# Patient Record
Sex: Female | Born: 1955
Health system: Southern US, Community
[De-identification: ages and names within clinical notes are randomized; demographics above are authoritative.]

## PROBLEM LIST (undated history)

## (undated) DIAGNOSIS — M1710 Unilateral primary osteoarthritis, unspecified knee: Secondary | ICD-10-CM

## (undated) DIAGNOSIS — C541 Malignant neoplasm of endometrium: Secondary | ICD-10-CM

## (undated) DIAGNOSIS — N979 Female infertility, unspecified: Secondary | ICD-10-CM

## (undated) DIAGNOSIS — N200 Calculus of kidney: Secondary | ICD-10-CM

## (undated) DIAGNOSIS — M810 Age-related osteoporosis without current pathological fracture: Secondary | ICD-10-CM

## (undated) DIAGNOSIS — F329 Major depressive disorder, single episode, unspecified: Secondary | ICD-10-CM

## (undated) DIAGNOSIS — I10 Essential (primary) hypertension: Secondary | ICD-10-CM

## (undated) DIAGNOSIS — H409 Unspecified glaucoma: Secondary | ICD-10-CM

## (undated) DIAGNOSIS — M2240 Chondromalacia patellae, unspecified knee: Secondary | ICD-10-CM

## (undated) DIAGNOSIS — M1990 Unspecified osteoarthritis, unspecified site: Secondary | ICD-10-CM

## (undated) DIAGNOSIS — M722 Plantar fascial fibromatosis: Secondary | ICD-10-CM

## (undated) DIAGNOSIS — I2510 Atherosclerotic heart disease of native coronary artery without angina pectoris: Secondary | ICD-10-CM

## (undated) DIAGNOSIS — B009 Herpesviral infection, unspecified: Secondary | ICD-10-CM

## (undated) DIAGNOSIS — H269 Unspecified cataract: Secondary | ICD-10-CM

## (undated) DIAGNOSIS — N858 Other specified noninflammatory disorders of uterus: Secondary | ICD-10-CM

## (undated) DIAGNOSIS — F419 Anxiety disorder, unspecified: Secondary | ICD-10-CM

## (undated) DIAGNOSIS — H40002 Preglaucoma, unspecified, left eye: Secondary | ICD-10-CM

## (undated) DIAGNOSIS — M19019 Primary osteoarthritis, unspecified shoulder: Secondary | ICD-10-CM

## (undated) DIAGNOSIS — H8110 Benign paroxysmal vertigo, unspecified ear: Secondary | ICD-10-CM

## (undated) DIAGNOSIS — K219 Gastro-esophageal reflux disease without esophagitis: Secondary | ICD-10-CM

## (undated) DIAGNOSIS — K5792 Diverticulitis of intestine, part unspecified, without perforation or abscess without bleeding: Secondary | ICD-10-CM

## (undated) DIAGNOSIS — J309 Allergic rhinitis, unspecified: Secondary | ICD-10-CM

## (undated) DIAGNOSIS — F439 Reaction to severe stress, unspecified: Secondary | ICD-10-CM

## (undated) DIAGNOSIS — Z87442 Personal history of urinary calculi: Secondary | ICD-10-CM

## (undated) DIAGNOSIS — Z86010 Personal history of colonic polyps: Secondary | ICD-10-CM

## (undated) DIAGNOSIS — D751 Secondary polycythemia: Secondary | ICD-10-CM

## (undated) DIAGNOSIS — M7730 Calcaneal spur, unspecified foot: Secondary | ICD-10-CM

## (undated) DIAGNOSIS — G4730 Sleep apnea, unspecified: Secondary | ICD-10-CM

## (undated) DIAGNOSIS — Z8719 Personal history of other diseases of the digestive system: Secondary | ICD-10-CM

## (undated) DIAGNOSIS — M7710 Lateral epicondylitis, unspecified elbow: Secondary | ICD-10-CM

## (undated) DIAGNOSIS — M20019 Mallet finger of unspecified finger(s): Secondary | ICD-10-CM

## (undated) DIAGNOSIS — R0789 Other chest pain: Secondary | ICD-10-CM

## (undated) DIAGNOSIS — N882 Stricture and stenosis of cervix uteri: Secondary | ICD-10-CM

## (undated) DIAGNOSIS — Z973 Presence of spectacles and contact lenses: Secondary | ICD-10-CM

## (undated) DIAGNOSIS — T7840XA Allergy, unspecified, initial encounter: Secondary | ICD-10-CM

## (undated) DIAGNOSIS — R350 Frequency of micturition: Secondary | ICD-10-CM

## (undated) DIAGNOSIS — K5730 Diverticulosis of large intestine without perforation or abscess without bleeding: Secondary | ICD-10-CM

## (undated) HISTORY — PX: TONSILLECTOMY: SUR1361

## (undated) HISTORY — DX: Anxiety disorder, unspecified: F41.9

## (undated) HISTORY — DX: Plantar fascial fibromatosis: M72.2

## (undated) HISTORY — PX: FINGER SURGERY: SHX640

## (undated) HISTORY — DX: Allergy, unspecified, initial encounter: T78.40XA

## (undated) HISTORY — DX: Secondary polycythemia: D75.1

## (undated) HISTORY — DX: Herpesviral infection, unspecified: B00.9

## (undated) HISTORY — DX: Lateral epicondylitis, unspecified elbow: M77.10

## (undated) HISTORY — DX: Unspecified glaucoma: H40.9

## (undated) HISTORY — PX: ABDOMINAL HYSTERECTOMY: SHX81

## (undated) HISTORY — DX: Unspecified cataract: H26.9

## (undated) HISTORY — DX: Other chest pain: R07.89

## (undated) HISTORY — DX: Personal history of colonic polyps: Z86.010

## (undated) HISTORY — DX: Calcaneal spur, unspecified foot: M77.30

## (undated) HISTORY — PX: SEPTOPLASTY: SUR1290

## (undated) HISTORY — DX: Chondromalacia patellae, unspecified knee: M22.40

## (undated) HISTORY — DX: Primary osteoarthritis, unspecified shoulder: M19.019

## (undated) HISTORY — DX: Female infertility, unspecified: N97.9

## (undated) HISTORY — PX: EYE SURGERY: SHX253

## (undated) HISTORY — DX: Essential (primary) hypertension: I10

## (undated) HISTORY — DX: Unilateral primary osteoarthritis, unspecified knee: M17.10

## (undated) HISTORY — PX: CARDIAC CATHETERIZATION: SHX172

## (undated) HISTORY — DX: Mallet finger of unspecified finger(s): M20.019

## (undated) HISTORY — PX: FRACTURE SURGERY: SHX138

## (undated) HISTORY — DX: Allergic rhinitis, unspecified: J30.9

## (undated) HISTORY — DX: Stricture and stenosis of cervix uteri: N88.2

## (undated) HISTORY — DX: Age-related osteoporosis without current pathological fracture: M81.0

## (undated) HISTORY — DX: Calculus of kidney: N20.0

## (undated) HISTORY — DX: Reaction to severe stress, unspecified: F43.9

## (undated) HISTORY — PX: COLONOSCOPY: SHX174

## (undated) HISTORY — DX: Major depressive disorder, single episode, unspecified: F32.9

## (undated) HISTORY — PX: TONSILLECTOMY: SHX5217

---

## 1994-09-02 HISTORY — PX: DIAGNOSTIC LAPAROSCOPY: SUR761

## 2002-09-02 HISTORY — PX: ANAL SPHINCTEROPLASTY: SUR1305

## 2002-09-02 HISTORY — PX: HEMORRHOID SURGERY: SHX153

## 2005-04-23 ENCOUNTER — Other Ambulatory Visit: Admission: RE | Admit: 2005-04-23 | Discharge: 2005-04-23 | Payer: Self-pay | Admitting: Obstetrics and Gynecology

## 2006-06-06 ENCOUNTER — Other Ambulatory Visit: Admission: RE | Admit: 2006-06-06 | Discharge: 2006-06-06 | Payer: Self-pay | Admitting: Obstetrics and Gynecology

## 2007-09-03 HISTORY — PX: COLONOSCOPY: SHX174

## 2007-11-11 ENCOUNTER — Other Ambulatory Visit: Admission: RE | Admit: 2007-11-11 | Discharge: 2007-11-11 | Payer: Self-pay | Admitting: Obstetrics and Gynecology

## 2007-11-26 ENCOUNTER — Ambulatory Visit: Payer: Self-pay | Admitting: Internal Medicine

## 2007-12-08 ENCOUNTER — Ambulatory Visit: Payer: Self-pay | Admitting: Internal Medicine

## 2008-11-29 ENCOUNTER — Other Ambulatory Visit: Admission: RE | Admit: 2008-11-29 | Discharge: 2008-11-29 | Payer: Self-pay | Admitting: Obstetrics and Gynecology

## 2009-09-14 ENCOUNTER — Emergency Department: Payer: Self-pay | Admitting: Emergency Medicine

## 2010-07-17 ENCOUNTER — Encounter: Admission: RE | Admit: 2010-07-17 | Discharge: 2010-07-17 | Payer: Self-pay | Admitting: Family Medicine

## 2011-09-03 HISTORY — PX: CATARACT EXTRACTION, BILATERAL: SHX1313

## 2011-09-03 HISTORY — PX: CATARACT EXTRACTION W/ INTRAOCULAR LENS  IMPLANT, BILATERAL: SHX1307

## 2012-08-05 ENCOUNTER — Telehealth: Payer: Self-pay | Admitting: Internal Medicine

## 2012-08-05 NOTE — Telephone Encounter (Signed)
C/D 08/05/12 for appt.08/11/12

## 2012-08-05 NOTE — Telephone Encounter (Signed)
S/W PT IN REF TO NP APPT. ON 08/11/12@9 :30 REFERRING DR WHITE  DX-POLYCYTHEMIA MAILED NP PACKET

## 2012-08-11 ENCOUNTER — Other Ambulatory Visit (HOSPITAL_BASED_OUTPATIENT_CLINIC_OR_DEPARTMENT_OTHER): Payer: Commercial Managed Care - PPO | Admitting: Lab

## 2012-08-11 ENCOUNTER — Ambulatory Visit (HOSPITAL_BASED_OUTPATIENT_CLINIC_OR_DEPARTMENT_OTHER): Payer: Commercial Managed Care - PPO | Admitting: Internal Medicine

## 2012-08-11 ENCOUNTER — Ambulatory Visit (HOSPITAL_BASED_OUTPATIENT_CLINIC_OR_DEPARTMENT_OTHER): Payer: Commercial Managed Care - PPO

## 2012-08-11 ENCOUNTER — Encounter: Payer: Self-pay | Admitting: Internal Medicine

## 2012-08-11 ENCOUNTER — Telehealth: Payer: Self-pay | Admitting: Internal Medicine

## 2012-08-11 VITALS — BP 132/80 | HR 77 | Temp 97.2°F | Resp 18 | Ht 66.5 in | Wt 177.4 lb

## 2012-08-11 DIAGNOSIS — D751 Secondary polycythemia: Secondary | ICD-10-CM

## 2012-08-11 DIAGNOSIS — D45 Polycythemia vera: Secondary | ICD-10-CM

## 2012-08-11 DIAGNOSIS — D72829 Elevated white blood cell count, unspecified: Secondary | ICD-10-CM

## 2012-08-11 LAB — CBC WITH DIFFERENTIAL/PLATELET
BASO%: 0.1 % (ref 0.0–2.0)
EOS%: 0 % (ref 0.0–7.0)
HCT: 46.2 % (ref 34.8–46.6)
LYMPH%: 7.7 % — ABNORMAL LOW (ref 14.0–49.7)
MCH: 32.3 pg (ref 25.1–34.0)
MCHC: 35.1 g/dL (ref 31.5–36.0)
NEUT%: 86.8 % — ABNORMAL HIGH (ref 38.4–76.8)
Platelets: 310 10*3/uL (ref 145–400)
WBC: 18 10*3/uL — ABNORMAL HIGH (ref 3.9–10.3)

## 2012-08-11 LAB — COMPREHENSIVE METABOLIC PANEL (CC13)
AST: 17 U/L (ref 5–34)
Alkaline Phosphatase: 53 U/L (ref 40–150)
BUN: 18 mg/dL (ref 7.0–26.0)
Creatinine: 0.8 mg/dL (ref 0.6–1.1)
Total Bilirubin: 0.79 mg/dL (ref 0.20–1.20)

## 2012-08-11 NOTE — Telephone Encounter (Signed)
gv and printed pt appt schedule for Dec 31st...the patient aware

## 2012-08-11 NOTE — Progress Notes (Signed)
[facility] [phone]   [phone]  CONSULT NOTE  REASON FOR CONSULTATION:  56 years old white female with polycythemia  HPI Danielle Knapp is a 56 y.o. female with past medical history significant for hypertension, depression, and fertility, glaucoma and kidney stone. The patient was seen recently by her primary care physician Dr. Laurann Montana for routine physical examination and blood pressure monitoring. She had CBC performed recently on 07/22/2012 which showed persistent elevation of her hemoglobin to 16.8 and hematocrit 47.8%. On 02/20/2010 her hemoglobin was 16.9 and hematocrit 48.9. The patient has been on hormonal treatment with progesterone and Vivelle for more than 2 years. She does not take any iron supplements except for multivitamins. She does not take any other nutritional supplements over the counter. She had a cortisone shot in her shoulder yesterday. She otherwise feeling fine with no specific complaints. She denied having any significant weakness and fatigue. She denied having any weight loss or night sweats. She has no palpable lymphadenopathy. She has no bleeding issues. She had previous iron study in 2011 that was normal.   Past medical history: Significant for depression, hypertension, and fertility, polycythemia, glaucoma, kidney stone and bilateral cataract.  Family history: significant for maternal grandmother with bone cancer, mother at heart disease and diverticulitis, father with diabetes mellitus and died from a stroke and a sister with ovarian cancer.  Social History; the patient is married and has 2 step children. 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 She has no history of smoking but drinks alcohol occasionally and no history of drug abuse.  Allergies  Allergen Reactions  . Erythromycin Diarrhea  . Latex Rash  . Nsaids Cough    Current Outpatient Prescriptions  Medication Sig Dispense Refill  .  losartan-hydrochlorothiazide (HYZAAR) 50-12.5 MG per tablet Take 12.5 mg by mouth Daily.      Marland Kitchen MIRVASO 0.33 % GEL Apply 0.33 % topically Daily.      Marland Kitchen ofloxacin (OCUFLOX) 0.3 % ophthalmic solution Place 1 drop into the right eye Twice daily.      . progesterone (PROMETRIUM) 200 MG capsule Take 200 mg by mouth Daily.      Marland Kitchen PROLENSA 0.07 % SOLN Place 1 drop into both eyes Daily.      . sertraline (ZOLOFT) 100 MG tablet Take 200 mg by mouth Daily.      . valACYclovir (VALTREX) 1000 MG tablet Take 1 tablet by mouth Ad lib.      Marland Kitchen VIVELLE-DOT 0.0375 MG/24HR Apply 0.375 mcg topically 2 (two) times a week.      . zolpidem (AMBIEN CR) 6.25 MG CR tablet Take 6.25 mg by mouth Daily.        Review of Systems  A comprehensive review of systems was negative.  Physical Exam  WUX:LKGMW, healthy, no distress, well nourished and well developed SKIN: skin color, texture, turgor are normal HEAD: Normocephalic, No masses, lesions, tenderness or abnormalities EYES: normal, PERRLA EARS: External ears normal OROPHARYNX:no exudate and no erythema  NECK: supple, no adenopathy LYMPH:  no palpable lymphadenopathy, no hepatosplenomegaly BREAST:not examined LUNGS: clear to auscultation  HEART: regular rate & rhythm and no murmurs ABDOMEN:abdomen soft and non-tender BACK: Back symmetric, no curvature. EXTREMITIES:no joint deformities, effusion, or inflammation, no edema, no skin discoloration, no clubbing  NEURO: alert & oriented x 3 with fluent speech, no focal motor/sensory deficits  PERFORMANCE STATUS: ECOG 0  LABORATORY DATA: Lab Results  Component Value Date   WBC  18.0* 08/11/2012   HGB 16.2* 08/11/2012   HCT 46.2 08/11/2012   MCV 91.9 08/11/2012   PLT 310 08/11/2012      Chemistry   No results found for this basename: NA, K, CL, CO2, BUN, CREATININE, GLU   No results found for this basename: CALCIUM, ALKPHOS, AST, ALT, BILITOT       RADIOGRAPHIC STUDIES: No results  found.  ASSESSMENT: This is a very pleasant 56 years old white female with persistent paresis unit for the last 2 years most likely reactive in nature secondary to hormonal treatment but I cannot rule out myeloproliferative disorder at this point. Her leukocytosis today is most likely secondary to her steroid injection yesterday and this is expected to improve in the next 1- 2 weeks.  PLAN: I have a lengthy discussion with the patient today about her condition. I ordered several studies to rule out polycythemia vera including repeat CBC, comprehensive metabolic panel, LDH, and erythropoietin level as well as JAK-2 mutation. I would see the patient back for followup visit in 3 weeks for evaluation and discussion of the pending lab results. I the lab results are consistent with polycythemia vera I would consider the patient for treatment with phlebotomy as-needed basis plus minus treatment with hydroxyurea. The patient agreed to the current plan.  All questions were answered. The patient knows to call the clinic with any problems, questions or concerns. We can certainly see the patient much sooner if necessary.  Thank you so much for allowing me to participate in the care of Danielle Knapp. I will continue to follow up the patient with you and assist in her care.  I spent 25 minutes counseling the patient face to face. The total time spent in the appointment was 50 minutes.  Pravin Perezperez K. 08/11/2012, 10:35 AM

## 2012-08-11 NOTE — Patient Instructions (Signed)
You have elevation in your hemoglobin and hematocrit most likely reactive in nature secondary to hormonal treatment but I will order the blood test to rule out polycythemia vera. Followup in 3 weeks

## 2012-08-28 ENCOUNTER — Telehealth: Payer: Self-pay | Admitting: Internal Medicine

## 2012-08-28 NOTE — Telephone Encounter (Signed)
called pt and she is aware that we r/s her appt time on 12/31     anne

## 2012-09-01 ENCOUNTER — Other Ambulatory Visit: Payer: Commercial Managed Care - PPO | Admitting: Lab

## 2012-09-01 ENCOUNTER — Ambulatory Visit (HOSPITAL_BASED_OUTPATIENT_CLINIC_OR_DEPARTMENT_OTHER): Payer: Commercial Managed Care - PPO | Admitting: Internal Medicine

## 2012-09-01 ENCOUNTER — Other Ambulatory Visit (HOSPITAL_BASED_OUTPATIENT_CLINIC_OR_DEPARTMENT_OTHER): Payer: Commercial Managed Care - PPO | Admitting: Lab

## 2012-09-01 ENCOUNTER — Ambulatory Visit: Payer: Commercial Managed Care - PPO | Admitting: Internal Medicine

## 2012-09-01 ENCOUNTER — Encounter: Payer: Self-pay | Admitting: Internal Medicine

## 2012-09-01 VITALS — BP 119/78 | HR 66 | Temp 97.0°F | Resp 20 | Ht 65.5 in | Wt 179.2 lb

## 2012-09-01 DIAGNOSIS — D45 Polycythemia vera: Secondary | ICD-10-CM

## 2012-09-01 DIAGNOSIS — D751 Secondary polycythemia: Secondary | ICD-10-CM

## 2012-09-01 LAB — CBC WITH DIFFERENTIAL/PLATELET
Basophils Absolute: 0.1 10*3/uL (ref 0.0–0.1)
Eosinophils Absolute: 0.2 10*3/uL (ref 0.0–0.5)
HGB: 16.4 g/dL — ABNORMAL HIGH (ref 11.6–15.9)
MCV: 92.8 fL (ref 79.5–101.0)
MONO%: 8 % (ref 0.0–14.0)
NEUT#: 5 10*3/uL (ref 1.5–6.5)
Platelets: 240 10*3/uL (ref 145–400)
RDW: 13 % (ref 11.2–14.5)

## 2012-09-01 NOTE — Patient Instructions (Signed)
Your blood work is stable today. No clear evidence for polycythemia vera. Followup with your primary care physician as previously scheduled.

## 2012-09-01 NOTE — Progress Notes (Signed)
[facility] [phone]   [phone]  OFFICE PROGRESS NOTE  DIAGNOSIS: Polycythemia most likely reactive in nature.  PRIOR THERAPY: None.  CURRENT THERAPY: Observation.  INTERVAL HISTORY: Danielle Knapp 56 y.o. female returns to the clinic today for followup visit accompanied her husband. The patient is feeling fine today with no specific complaints. She denied having any significant weight loss or night sweats. She has no chest pain, shortness breath, cough or hemoptysis. She has no headache or blurry vision. I ordered several studies to evaluate her polycythemia including JAK-2 mutation that came back negative. Serum erythropoietin was normal at 6.4. The patient had repeat CBC performed earlier today and she is here for evaluation and discussion of her lab results. She still on hormonal therapy with progesterone and Vivelle.   ALLERGIES:  is allergic to erythromycin; latex; and nsaids.  MEDICATIONS:  Current Outpatient Prescriptions  Medication Sig Dispense Refill  . aspirin 81 MG tablet Take 81 mg by mouth daily.      . calcium carbonate (OS-CAL) 600 MG TABS Take 600 mg by mouth 2 (two) times daily with a meal.      . fish oil-omega-3 fatty acids 1000 MG capsule Take 2,400 mg by mouth daily.      Marland Kitchen losartan-hydrochlorothiazide (HYZAAR) 50-12.5 MG per tablet Take 12.5 mg by mouth Daily.      . meclizine (ANTIVERT) 12.5 MG tablet Take 12.5 mg by mouth 3 (three) times daily as needed.      Marland Kitchen MIRVASO 0.33 % GEL Apply 0.33 % topically Daily.      . Multiple Vitamin (MULTIVITAMIN) tablet Take 1 tablet by mouth daily.      . progesterone (PROMETRIUM) 200 MG capsule Take 200 mg by mouth Daily.      . sertraline (ZOLOFT) 100 MG tablet Take 200 mg by mouth Daily.      . valACYclovir (VALTREX) 1000 MG tablet Take 1 tablet by mouth as needed.       Marland Kitchen VIVELLE-DOT 0.0375 MG/24HR Apply 0.375 mcg topically 2 (two) times a week.      . zolpidem (AMBIEN CR)  6.25 MG CR tablet Take 6.25 mg by mouth Daily.        REVIEW OF SYSTEMS:  A comprehensive review of systems was negative.   PHYSICAL EXAMINATION: General appearance: alert, cooperative and no distress Head: Normocephalic, without obvious abnormality, atraumatic Neck: no adenopathy Lymph nodes: Cervical, supraclavicular, and axillary nodes normal. Resp: clear to auscultation bilaterally Cardio: regular rate and rhythm, S1, S2 normal, no murmur, click, rub or gallop GI: soft, non-tender; bowel sounds normal; no masses,  no organomegaly Extremities: extremities normal, atraumatic, no cyanosis or edema  ECOG PERFORMANCE STATUS: 0 - Asymptomatic  Blood pressure 119/78, pulse 66, temperature 97 F (36.1 C), temperature source Oral, resp. rate 20, height 5' 5.5" (1.664 m), weight 179 lb 3.2 oz (81.285 kg).  LABORATORY DATA: Lab Results  Component Value Date   WBC 8.6 09/01/2012   HGB 16.4* 09/01/2012   HCT 45.7 09/01/2012   MCV 92.8 09/01/2012   PLT 240 09/01/2012      Chemistry      Component Value Date/Time   NA 141 08/11/2012 0935   K 3.6 08/11/2012 0935   CL 105 08/11/2012 0935   CO2 26 08/11/2012 0935   BUN 18.0 08/11/2012 0935   CREATININE 0.8 08/11/2012 0935      Component Value Date/Time   CALCIUM 9.9 08/11/2012 0935  ALKPHOS 53 08/11/2012 0935   AST 17 08/11/2012 0935   ALT 24 08/11/2012 0935   BILITOT 0.79 08/11/2012 0935       RADIOGRAPHIC STUDIES: No results found.  ASSESSMENT: This is a very pleasant 56 years old white female with a persistent polycythemia most likely reactive in nature secondary to her hormonal treatment. Her leukocytosis resolved.  PLAN: I discussed the lab result with the patient today. I recommended for her to continue on observation with routine followup visit with her primary care physician. I also advice the patient to consider blood donation every few months at the [facility] as needed. The patient and her husband agreed to the  current plan. She was advised to call me immediately if she has any concerning symptoms or other hematologic abnormalities.  All questions were answered. The patient knows to call the clinic with any problems, questions or concerns. We can certainly see the patient much sooner if necessary.

## 2013-01-26 ENCOUNTER — Encounter: Payer: Self-pay | Admitting: Obstetrics and Gynecology

## 2013-01-26 DIAGNOSIS — I10 Essential (primary) hypertension: Secondary | ICD-10-CM | POA: Insufficient documentation

## 2013-01-26 DIAGNOSIS — F329 Major depressive disorder, single episode, unspecified: Secondary | ICD-10-CM | POA: Insufficient documentation

## 2013-01-26 DIAGNOSIS — F32A Depression, unspecified: Secondary | ICD-10-CM | POA: Insufficient documentation

## 2013-01-27 ENCOUNTER — Encounter: Payer: Self-pay | Admitting: Obstetrics and Gynecology

## 2013-01-27 ENCOUNTER — Ambulatory Visit (INDEPENDENT_AMBULATORY_CARE_PROVIDER_SITE_OTHER): Payer: Commercial Managed Care - PPO | Admitting: Obstetrics and Gynecology

## 2013-01-27 VITALS — BP 110/66 | HR 60 | Ht 66.5 in | Wt 182.0 lb

## 2013-01-27 DIAGNOSIS — Z01419 Encounter for gynecological examination (general) (routine) without abnormal findings: Secondary | ICD-10-CM

## 2013-01-27 DIAGNOSIS — D45 Polycythemia vera: Secondary | ICD-10-CM

## 2013-01-27 DIAGNOSIS — D751 Secondary polycythemia: Secondary | ICD-10-CM

## 2013-01-27 MED ORDER — ESTRADIOL 0.0375 MG/24HR TD PTTW: 1.0000 | MEDICATED_PATCH | TRANSDERMAL | Status: DC

## 2013-01-27 MED ORDER — PROGESTERONE MICRONIZED 200 MG PO CAPS: 200.0000 mg | ORAL_CAPSULE | Freq: Every day | ORAL | Status: DC

## 2013-01-27 NOTE — Patient Instructions (Addendum)

## 2013-01-27 NOTE — Progress Notes (Signed)
Patient ID: Danielle Knapp, female   DOB: 1956/07/12, 57 y.o.   MRN: [id number] 57 y.o.   Married    Caucasian   female   [id number]   here for annual exam.  Had PMB on HRT with poss endometrioma on post lip of cx.  HRT shanged to Vivelle 0.0375 and P4 200 mg qd to try and suppress the endo.  Pt is hightly uncomfortable w/o E2 due to v-m sx's.   No LMP recorded. Patient is postmenopausal.          Sexually active: yes  The current method of family planning is post menopausal status.    Exercising: walking 3 x week Last mammogram:  2013 Last pap smear: 12/20/09 History of abnormal pap: yes , years ago Smoking: never Alcohol: 2 glasses wine/week Last colonoscopy: 12/08/07 Last Bone Density:  2010 Last tetanus shot: UTD Last cholesterol check: within 1 year   Hgb: PCP            Urine: PCP   Family History  Problem Relation Age of Onset  . Osteoporosis Mother   . Heart attack Mother   . Diabetes Father   . Hypertension Father   . Ovarian cancer Sister     Patient Active Problem List   Diagnosis Date Noted  . Depression   . Hypertension   . Polycythemia 08/11/2012    Past Medical History  Diagnosis Date  . Depression   . Hypertension   . Kidney stones 1/11    spontaneously passed dx'd based on blood test & history of cancer    Past Surgical History  Procedure Laterality Date  . Diagnostic laparoscopy  1996  . Septoplasty  1970's    x2  . Anal sphincteroplasty  2004    hemorrhoids  . Tonsillectomy    . Cataract extraction, bilateral  2013    Allergies: Erythromycin; Latex; and Nsaids  Current Outpatient Prescriptions  Medication Sig Dispense Refill  . aspirin 81 MG tablet Take 81 mg by mouth daily.      Marland Kitchen buPROPion (WELLBUTRIN XL) 150 MG 24 hr tablet Take 150 mg by mouth daily.      . Calcium Lactate 750 MG TABS Take 1 tablet by mouth daily.      . fish oil-omega-3 fatty acids 1000 MG capsule Take 2,400 mg by mouth daily.      Marland Kitchen losartan-hydrochlorothiazide (HYZAAR)  50-12.5 MG per tablet Take 12.5 mg by mouth Daily.      . meclizine (ANTIVERT) 12.5 MG tablet Take 12.5 mg by mouth 3 (three) times daily as needed.      . Multiple Vitamin (MULTIVITAMIN) tablet Take 1 tablet by mouth daily.      . naproxen (NAPROSYN) 500 MG tablet Take 500 mg by mouth 2 (two) times daily with a meal.      . progesterone (PROMETRIUM) 200 MG capsule Take 200 mg by mouth Daily.      . sertraline (ZOLOFT) 100 MG tablet Take 100 mg by mouth Daily.       . valACYclovir (VALTREX) 1000 MG tablet Take 1 tablet by mouth as needed.       Marland Kitchen VIVELLE-DOT 0.0375 MG/24HR Apply 0.375 mcg topically 2 (two) times a week.      . zolpidem (AMBIEN CR) 6.25 MG CR tablet Take 6.25 mg by mouth Daily.       No current facility-administered medications for this visit.   Uses valtrex for fever blisters in nose.   ROS: Pertinent  items are noted in HPI.  Social Hx:    Exam:    BP 110/66  Pulse 60  Ht 5' 6.5" (1.689 m)  Wt 182 lb (82.555 kg)  BMI 28.94 kg/m2  Ht stable and wt up 3 pounds from last year.   Wt Readings from Last 3 Encounters:  01/27/13 182 lb (82.555 kg)  09/01/12 179 lb 3.2 oz (81.285 kg)  08/11/12 177 lb 6.4 oz (80.468 kg)     Ht Readings from Last 3 Encounters:  01/27/13 5' 6.5" (1.689 m)  09/01/12 5' 5.5" (1.664 m)  08/11/12 5' 6.5" (1.689 m)    General appearance: alert, cooperative and appears stated age Head: Normocephalic, without obvious abnormality, atraumatic Neck: no adenopathy, supple, symmetrical, trachea midline and thyroid not enlarged, symmetric, no tenderness/mass/nodules Lungs: clear to auscultation bilaterally Breasts: Inspection negative, No nipple retraction or dimpling, No nipple discharge or bleeding, No axillary or supraclavicular adenopathy, Normal to palpation without dominant masses Heart: regular rate and rhythm Abdomen: soft, non-tender; bowel sounds normal; no masses,  no organomegaly Extremities: extremities normal, atraumatic, no cyanosis  or edema Skin: Skin color, texture, turgor normal. No rashes or lesions Lymph nodes: Cervical, supraclavicular, and axillary nodes normal. No abnormal inguinal nodes palpated Neurologic: Grossly normal   Pelvic: External genitalia:  no lesions              Urethra:  normal appearing urethra with no masses, tenderness or lesions              Bartholins and Skenes: normal                 Vagina: normal appearing vagina with normal color and discharge, no lesions              Cervix: normal appearance              Pap taken: yes        Bimanual Exam:  Uterus:  uterus is normal size, shape, consistency and nontender                                      Adnexa: normal adnexa in size, nontender and no masses                                      Rectovaginal: Confirms                                      Anus:  normal sphincter tone, no lesions  A: normal menopausal exam, on HRT     Mild endo on laparoscopy in 1996      P: mammogram pap smear counseled on breast self exam, mammography screening, adequate intake of calcium and vitamin D, diet and exercise return annually or prn  RF her vivelle and p4 for 1 year    An After Visit Summary was printed and given to the patient.

## 2013-02-03 ENCOUNTER — Encounter (HOSPITAL_COMMUNITY): Payer: Self-pay | Admitting: Emergency Medicine

## 2013-02-03 ENCOUNTER — Emergency Department (INDEPENDENT_AMBULATORY_CARE_PROVIDER_SITE_OTHER)
Admission: EM | Admit: 2013-02-03 | Discharge: 2013-02-03 | Disposition: A | Discharge status: Home / Self Care | Payer: Worker's Compensation | Source: Home / Self Care | Attending: Emergency Medicine | Admitting: Emergency Medicine

## 2013-02-03 ENCOUNTER — Other Ambulatory Visit: Payer: Self-pay | Admitting: Obstetrics and Gynecology

## 2013-02-03 DIAGNOSIS — S61409A Unspecified open wound of unspecified hand, initial encounter: Secondary | ICD-10-CM

## 2013-02-03 DIAGNOSIS — S61411A Laceration without foreign body of right hand, initial encounter: Secondary | ICD-10-CM

## 2013-02-03 NOTE — ED Notes (Signed)
Pt was at work when she cut her right hand/knuckle between middle and index finger w/a paper cutter onset 1600 Last tetanus: unknown... Bleeding controlled... She is alert and oriented w/no signs of acute distress.

## 2013-02-03 NOTE — ED Provider Notes (Signed)
History     CSN: [id number]  Arrival date & time 02/03/13  1748   First MD Initiated Contact with Patient 02/03/13 1822      Chief Complaint  Patient presents with  . Laceration    (Consider location/radiation/quality/duration/timing/severity/associated sxs/prior treatment) HPI Comments: Patient presents urgent care this evening after having sustained a laceration with a paper cutter on her right hand in the interdigital space between her index finger and middle finger. She immediately proceeded to clean the area irrigated the area with cold water and apply a topical antibiotic. Patient is able to move her fingers without any restriction both index and middle finger. No numbness or tingling sensation distally or proximally to the area of  Patient is a 57 y.o. female presenting with skin laceration. The history is provided by the patient.  Laceration Location:  Hand Hand laceration location:  R hand Depth:  Cutaneous Bleeding: controlled   Laceration mechanism:  Metal edge Pain details:    Quality:  Aching   Severity:  Mild Relieved by:  Pressure Tetanus status:  Up to date   Past Medical History  Diagnosis Date  . Depression   . Hypertension   . Kidney stones 1/11    spontaneously passed dx'd based on blood test & history of cancer    Past Surgical History  Procedure Laterality Date  . Diagnostic laparoscopy  1996  . Septoplasty  1970's    x2  . Anal sphincteroplasty  2004    hemorrhoids  . Tonsillectomy    . Cataract extraction, bilateral  2013    Family History  Problem Relation Age of Onset  . Osteoporosis Mother   . Heart attack Mother   . Diabetes Father   . Hypertension Father   . Ovarian cancer Sister     History  Substance Use Topics  . Smoking status: Never Smoker   . Smokeless tobacco: Never Used  . Alcohol Use: 1.2 oz/week    2 Glasses of wine per week    OB History   Grav Para Term Preterm Abortions TAB SAB Ect Mult Living   0 0 0 0 0 0  0 0 0 0      Review of Systems  Constitutional: Negative for activity change.  Musculoskeletal: Negative for myalgias and joint swelling.  Skin: Positive for wound. Negative for color change and pallor.    Allergies  Erythromycin; Latex; and Nsaids  Home Medications   Current Outpatient Rx  Name  Route  Sig  Dispense  Refill  . aspirin 81 MG tablet   Oral   Take 81 mg by mouth daily.         Marland Kitchen buPROPion (WELLBUTRIN XL) 150 MG 24 hr tablet   Oral   Take 150 mg by mouth daily.         . Calcium Lactate 750 MG TABS   Oral   Take 1 tablet by mouth daily.         . fish oil-omega-3 fatty acids 1000 MG capsule   Oral   Take 2,400 mg by mouth daily.         Marland Kitchen losartan-hydrochlorothiazide (HYZAAR) 50-12.5 MG per tablet   Oral   Take 12.5 mg by mouth Daily.         . meclizine (ANTIVERT) 12.5 MG tablet   Oral   Take 12.5 mg by mouth 3 (three) times daily as needed.         . Multiple Vitamin (MULTIVITAMIN) tablet  Oral   Take 1 tablet by mouth daily.         . naproxen (NAPROSYN) 500 MG tablet   Oral   Take 500 mg by mouth 2 (two) times daily with a meal.         . progesterone (PROMETRIUM) 200 MG capsule      take 1 capsule by mouth once daily   30 capsule   0   . sertraline (ZOLOFT) 100 MG tablet   Oral   Take 100 mg by mouth Daily.          . valACYclovir (VALTREX) 1000 MG tablet   Oral   Take 1 tablet by mouth as needed.          Marland Kitchen VIVELLE-DOT 0.0375 MG/24HR      apply 1 patch two times a week   8 patch   0   . zolpidem (AMBIEN CR) 6.25 MG CR tablet   Oral   Take 6.25 mg by mouth Daily.           BP 141/86  Pulse 55  Temp(Src) 98.3 F (36.8 C) (Oral)  Resp 20  SpO2 97%  Physical Exam  Constitutional: Vital signs are normal. She appears well-developed and well-nourished.  Musculoskeletal: She exhibits tenderness.       Hands: Neurological: She is alert.  Skin: No rash noted. No erythema.    ED Course    Procedures (including critical care time)  Labs Reviewed - No data to display No results found.   No diagnosis found.    MDM  Superficial laceration to dorsal aspect of right hand. Uncomplicated/epidermal level. We will apply Steri-Strips        Jimmie Molly, MD 02/03/13 [phone]

## 2013-02-03 NOTE — Telephone Encounter (Signed)
Patient has aex for 02/18/14

## 2013-07-08 ENCOUNTER — Other Ambulatory Visit: Payer: Self-pay

## 2013-09-02 HISTORY — PX: HEEL SPUR SURGERY: SHX665

## 2013-11-12 ENCOUNTER — Telehealth: Payer: Self-pay | Admitting: Obstetrics and Gynecology

## 2013-11-12 NOTE — Telephone Encounter (Signed)
Does patient want oral estradiol or patch form?

## 2013-11-12 NOTE — Telephone Encounter (Signed)
Patient is calling saying that insurance is requesting her to change her VIVELLE-DOT 0.0375 MG/24HR  apply 1 patch two times a week, Normal, Last Dose: Not Recorded  to estradiol because it is cheaper she wants to check and make sure it is okay to do so.

## 2013-11-12 NOTE — Telephone Encounter (Signed)
Routing to Dr. Quincy Simmonds as patient is requesting Estradiol instead of Vivelle.   Patient has AEX scheduled with Dr. Quincy Simmonds on 6/19. Prior patient of Dr. Joan Flores.

## 2013-11-15 ENCOUNTER — Other Ambulatory Visit: Payer: Self-pay | Admitting: Obstetrics and Gynecology

## 2013-11-15 MED ORDER — ESTRADIOL 0.0375 MG/24HR TD PTWK: 0.0375 mg | MEDICATED_PATCH | TRANSDERMAL | Status: DC

## 2013-11-15 NOTE — Telephone Encounter (Signed)
I put an order through for Estradiol patch 0.0375 mg , changing weekly, #4, RF 3 to get patient through until her annual examination in July 2015.  Please let the patient know.

## 2013-11-15 NOTE — Telephone Encounter (Signed)
Dr. Quincy Simmonds, patient would like to try the generic estradiol patch. I advised patch would be larger and sometimes patients have difficulty with sticking to skin but patient will "give it a try" and call back with any symptoms.     Order pended for your review.

## 2013-11-16 NOTE — Telephone Encounter (Signed)
Patient aware. Will call back with any problems.

## 2013-11-16 NOTE — Telephone Encounter (Signed)
Message left to return call to Tracy at [phone].    

## 2013-11-18 ENCOUNTER — Telehealth: Payer: Self-pay | Admitting: Obstetrics and Gynecology

## 2013-11-18 NOTE — Telephone Encounter (Signed)
Patient is calling about estridol patch. She wants to make sure that is is only once a week because her previous medication was twice a week.

## 2013-11-19 NOTE — Telephone Encounter (Signed)
Spoke with pt to advise that the new patch is to be used once weekly. Pt agreeable and will call with any problems.

## 2014-02-07 ENCOUNTER — Other Ambulatory Visit: Payer: Self-pay | Admitting: *Deleted

## 2014-02-07 MED ORDER — PROGESTERONE MICRONIZED 200 MG PO CAPS: 200.0000 mg | ORAL_CAPSULE | Freq: Every day | ORAL | Status: DC

## 2014-02-07 NOTE — Telephone Encounter (Signed)
Last AEX and refill 01/27/13 #30/12 refills Next appt 02/18/14 MMG 09/2013 [id number]  Rx sent for 1 month until appt.

## 2014-02-11 ENCOUNTER — Other Ambulatory Visit: Payer: Self-pay | Admitting: *Deleted

## 2014-02-11 MED ORDER — ESTRADIOL 0.0375 MG/24HR TD PTWK: 0.0375 mg | MEDICATED_PATCH | TRANSDERMAL | Status: DC

## 2014-02-11 NOTE — Telephone Encounter (Signed)
Faxed refill request received from [facility] for [place] filled by MD on 11/15/13, #4 X 3 Last AEX - 01/27/13 Next AEX - 02/18/14 Last MMG - 09/2013, normal  One month supply sent to last until AEX.

## 2014-02-18 ENCOUNTER — Encounter: Payer: Self-pay | Admitting: Obstetrics and Gynecology

## 2014-02-18 ENCOUNTER — Ambulatory Visit: Payer: Commercial Managed Care - PPO | Admitting: Obstetrics and Gynecology

## 2014-02-18 ENCOUNTER — Ambulatory Visit (INDEPENDENT_AMBULATORY_CARE_PROVIDER_SITE_OTHER): Payer: Commercial Managed Care - PPO | Admitting: Obstetrics and Gynecology

## 2014-02-18 VITALS — BP 128/70 | HR 60 | Ht 66.0 in | Wt 171.6 lb

## 2014-02-18 DIAGNOSIS — Z0000 Encounter for general adult medical examination without abnormal findings: Secondary | ICD-10-CM

## 2014-02-18 DIAGNOSIS — Z01419 Encounter for gynecological examination (general) (routine) without abnormal findings: Secondary | ICD-10-CM

## 2014-02-18 DIAGNOSIS — N952 Postmenopausal atrophic vaginitis: Secondary | ICD-10-CM

## 2014-02-18 LAB — POCT URINALYSIS DIPSTICK
BILIRUBIN UA: NEGATIVE
Blood, UA: NEGATIVE
Glucose, UA: NEGATIVE
KETONES UA: NEGATIVE
LEUKOCYTES UA: NEGATIVE
Nitrite, UA: NEGATIVE
PH UA: 5
PROTEIN UA: NEGATIVE
Urobilinogen, UA: NEGATIVE

## 2014-02-18 MED ORDER — ESTRADIOL 0.1 MG/GM VA CREA: TOPICAL_CREAM | VAGINAL | Status: DC

## 2014-02-18 MED ORDER — ESTRADIOL 0.0375 MG/24HR TD PTWK: 0.0375 mg | MEDICATED_PATCH | TRANSDERMAL | Status: DC

## 2014-02-18 MED ORDER — PROGESTERONE MICRONIZED 100 MG PO CAPS: 100.0000 mg | ORAL_CAPSULE | Freq: Every day | ORAL | Status: DC

## 2014-02-18 NOTE — Progress Notes (Signed)
Patient ID: Danielle Knapp, female   DOB: 12-03-1955, 58 y.o.   MRN: [id number] GYNECOLOGY VISIT  PCP:   Harlan Stains, MD  Referring provider:   HPI: 58 y.o.   Married  Caucasian  female   G0P0000 with Patient's last menstrual period was 09/03/2011.   here for   AEX. Patient is on Climara and Prometrium.  Takes to treat hot flashes and night sweats. Climara is generic now.  Experiencing vaginal dryness.   Some stress eating.  Taking Zoloft.   Helping to take care of mother who lives at [facility], skilled care nursing. Just moved up in November 2014.   Hgb:   PCP Urine:  Neg  GYNECOLOGIC HISTORY: Patient's last menstrual period was 09/03/2011. Sexually active:  yes Partner preference: female Contraception:  postmenopausal  Menopausal hormone therapy:  Climara patch DES exposure:   no Blood transfusions:  no  Sexually transmitted diseases: no   GYN procedures and prior surgeries:  Diagnostic laparoscopy Last mammogram:  09-23-13 wnl:[facility]               Last pap and high risk HPV testing:   01-27-13 wnl:neg HR HPV History of abnormal pap smear:  no   OB History   Grav Para Term Preterm Abortions TAB SAB Ect Mult Living   0 0 0 0 0 0 0 0 0 0        LIFESTYLE: Exercise:   Gym and walking            Tobacco:   no Alcohol:   2-3 drinks per week Drug use:  no  OTHER HEALTH MAINTENANCE: Tetanus/TDap:  Up to date with PCP Gardisil:             n/a Zostavax:           n/a  Bone density:     03/2009 wnl Colonoscopy:     12/2007 hx of polyps:?[facility]  Cholesterol check:  With PCP  Family History  Problem Relation Age of Onset  . Osteoporosis Mother   . Heart attack Mother   . Diabetes Father   . Hypertension Father   . Ovarian cancer Sister     Patient Active Problem List   Diagnosis Date Noted  . Depression   . Hypertension   . Polycythemia 08/11/2012   Past Medical History  Diagnosis Date  . Depression   . Hypertension   . Kidney stones 1/11     spontaneously passed dx'd based on blood test & history of cancer    Past Surgical History  Procedure Laterality Date  . Diagnostic laparoscopy  1996  . Septoplasty  1970's    x2  . Anal sphincteroplasty  2004    hemorrhoids  . Tonsillectomy    . Cataract extraction, bilateral  2013    ALLERGIES: Erythromycin; Latex; and Nsaids  Current Outpatient Prescriptions  Medication Sig Dispense Refill  . aspirin 81 MG tablet Take 81 mg by mouth daily.      Marland Kitchen buPROPion (WELLBUTRIN XL) 150 MG 24 hr tablet Take 150 mg by mouth daily.      . Calcium Lactate 750 MG TABS Take 1 tablet by mouth daily.      Marland Kitchen estradiol (CLIMARA - DOSED IN MG/24 HR) 0.0375 mg/24hr patch Place 1 patch (0.0375 mg total) onto the skin once a week.  4 patch  0  . fish oil-omega-3 fatty acids 1000 MG capsule Take 2,400 mg by mouth daily.      Marland Kitchen  losartan-hydrochlorothiazide (HYZAAR) 50-12.5 MG per tablet Take 12.5 mg by mouth Daily.      . meclizine (ANTIVERT) 12.5 MG tablet Take 12.5 mg by mouth 3 (three) times daily as needed.      . Multiple Vitamin (MULTIVITAMIN) tablet Take 1 tablet by mouth daily.      . naproxen (NAPROSYN) 500 MG tablet Take 500 mg by mouth 2 (two) times daily with a meal.      . progesterone (PROMETRIUM) 200 MG capsule Take 1 capsule (200 mg total) by mouth daily.  30 capsule  0  . sertraline (ZOLOFT) 100 MG tablet Take 100 mg by mouth Daily.       . valACYclovir (VALTREX) 1000 MG tablet Take 1 tablet by mouth as needed.       . zolpidem (AMBIEN) 10 MG tablet Take 10 mg by mouth at bedtime as needed for sleep.       No current facility-administered medications for this visit.     ROS:  Pertinent items are noted in HPI.  SOCIAL HISTORY:  Statistician.   PHYSICAL EXAMINATION:    BP 128/70  Pulse 60  Ht 5\' 6"  (1.676 m)  Wt 171 lb 9.6 oz (77.837 kg)  BMI 27.71 kg/m2  LMP 09/03/2011   Wt Readings from Last 3 Encounters:  02/18/14 171 lb 9.6 oz (77.837 kg)  01/27/13 182 lb (82.555  kg)  09/01/12 179 lb 3.2 oz (81.285 kg)     Ht Readings from Last 3 Encounters:  02/18/14 5\' 6"  (1.676 m)  01/27/13 5' 6.5" (1.689 m)  09/01/12 5' 5.5" (1.664 m)    General appearance: alert, cooperative and appears stated age Head: Normocephalic, without obvious abnormality, atraumatic Neck: no adenopathy, supple, symmetrical, trachea midline and thyroid not enlarged, symmetric, no tenderness/mass/nodules Lungs: clear to auscultation bilaterally Breasts: Inspection negative, No nipple retraction or dimpling, No nipple discharge or bleeding, No axillary or supraclavicular adenopathy, Normal to palpation without dominant masses Heart: regular rate and rhythm Abdomen: soft, non-tender; no masses,  no organomegaly Extremities: extremities normal, atraumatic, no cyanosis or edema Skin: Skin color, texture, turgor normal. No rashes or lesions Lymph nodes: Cervical, supraclavicular, and axillary nodes normal. No abnormal inguinal nodes palpated Neurologic: Grossly normal  Pelvic: External genitalia:  no lesions              Urethra:  normal appearing urethra with no masses, tenderness or lesions              Bartholins and Skenes: normal                 Vagina: normal appearing vagina with normal color and discharge, no lesions              Cervix: normal appearance              Pap and high risk HPV testing done: no.            Bimanual Exam:  Uterus:  uterus is normal size, shape, consistency and nontender                                      Adnexa: normal adnexa in size, nontender and no masses  Rectovaginal: Confirms                                      Anus:  normal sphincter tone, no lesions  ASSESSMENT  Normal gynecologic exam. HRT patient.  Atrophic vaginal symptoms.   PLAN  Mammogram recommended yearly.  Pap smear and high risk HPV testing not indicated.  Discussed risks of HRT - DVT, PE, MI, stroke, breast cancer.  Will refill  Climara 0.0375 mg to skin weekly.  Rx for one year.  See EPIC. Prometrium reduce to 100 mg daily.  Rx for one year.  See EPIC. Vaginal Estrace cream 1/2 gm pv at hs for 2 weeks and then twice weekly.  See EPIC. Counseled on self breast exam, exercise. Return annually or prn   An After Visit Summary was printed and given to the patient.

## 2014-02-18 NOTE — Patient Instructions (Signed)

## 2014-06-17 ENCOUNTER — Other Ambulatory Visit: Payer: Self-pay

## 2014-07-19 ENCOUNTER — Telehealth: Payer: Self-pay | Admitting: Obstetrics and Gynecology

## 2014-07-19 NOTE — Telephone Encounter (Signed)
Left message upcoming appointment 02/2015 has been canceled and needs to be rescheduled.

## 2015-02-20 ENCOUNTER — Ambulatory Visit (INDEPENDENT_AMBULATORY_CARE_PROVIDER_SITE_OTHER): Payer: Commercial Managed Care - PPO | Admitting: Obstetrics and Gynecology

## 2015-02-20 ENCOUNTER — Encounter: Payer: Self-pay | Admitting: Obstetrics and Gynecology

## 2015-02-20 VITALS — BP 120/66 | HR 60 | Resp 18 | Ht 66.0 in | Wt 177.4 lb

## 2015-02-20 DIAGNOSIS — Z01419 Encounter for gynecological examination (general) (routine) without abnormal findings: Secondary | ICD-10-CM

## 2015-02-20 MED ORDER — PROGESTERONE MICRONIZED 100 MG PO CAPS: 100.0000 mg | ORAL_CAPSULE | Freq: Every day | ORAL | Status: DC

## 2015-02-20 MED ORDER — ESTRADIOL 0.0375 MG/24HR TD PTTW: 1.0000 | MEDICATED_PATCH | TRANSDERMAL | Status: DC

## 2015-02-20 MED ORDER — ESTRADIOL 0.1 MG/GM VA CREA: TOPICAL_CREAM | VAGINAL | Status: DC

## 2015-02-20 NOTE — Progress Notes (Signed)
Patient ID: Danielle Knapp, female   DOB: 1956/02/19, 59 y.o.   MRN: [id number] 59 y.o. G0P0000 Married Caucasian female here for annual exam.   Patient is on Prometrium and Climara patch.  Also using vaginal Estrace.  Wants to switch back to smaller estrogen patch.  Was miserable with hot flashes prior to starting HRT.  Had foot surgery for a bone spur.  Some weight gain.  Increased Zoloft.  Decreased exercise.  Some GSI.   PCP:   Harlan Stains, MD  Patient's last menstrual period was 09/03/2011.          Sexually active: Yes.   female The current method of family planning is post menopausal status.    Exercising: Yes.    walking. Smoker:  no  Health Maintenance: Pap:  01-27-13 wnl:neg HR HPV History of abnormal Pap:  Yes, 3 years ago had colposcopy with Dr. Felicita Gage neg.  No treatment to cervix. MMG:  10/2014 dense/neg per patient:[facility]--called for report.  Had a repeat mammogram of the left breast and had a final normal result per patient. Colonoscopy:  2009 polyps with [facility]--?due 2017 BMD:   03/2009  Result  normal TDaP:  Up to date with PCP Screening Labs:  Hb today: PCP, Urine today: PCP   reports that she has never smoked. She has never used smokeless tobacco. She reports that she drinks about 1.8 oz of alcohol per week. She reports that she does not use illicit drugs.  Past Medical History  Diagnosis Date  . Depression   . Hypertension   . Kidney stones 1/11    spontaneously passed dx'd based on blood test & history of cancer  . Plantar fasciitis of left foot   . Tennis elbow     -right    Past Surgical History  Procedure Laterality Date  . Diagnostic laparoscopy  1996  . Septoplasty  1970's    x2  . Anal sphincteroplasty  2004    hemorrhoids  . Tonsillectomy    . Cataract extraction, bilateral  2013  . Heel spur surgery Right 2015    Dr. Durward Fortes    Current Outpatient Prescriptions  Medication Sig Dispense Refill  . aspirin 81 MG tablet Take 81  mg by mouth daily.    Marland Kitchen buPROPion (WELLBUTRIN XL) 300 MG 24 hr tablet Take 300 mg by mouth daily.  0  . Calcium Lactate 750 MG TABS Take 1 tablet by mouth daily.    Marland Kitchen estradiol (CLIMARA - DOSED IN MG/24 HR) 0.0375 mg/24hr patch Place 1 patch (0.0375 mg total) onto the skin once a week. 4 patch 11  . estradiol (ESTRACE VAGINAL) 0.1 MG/GM vaginal cream Place 1/2 gram in vagina at bedtime nightly for 2 weeks and then 1/2 gram twice weekly. 42.5 g 3  . fish oil-omega-3 fatty acids 1000 MG capsule Take 2,400 mg by mouth daily.    Marland Kitchen losartan-hydrochlorothiazide (HYZAAR) 50-12.5 MG per tablet Take 12.5 mg by mouth Daily.    . Multiple Vitamin (MULTIVITAMIN) tablet Take 1 tablet by mouth daily.    . naproxen (NAPROSYN) 500 MG tablet Take 500 mg by mouth 2 (two) times daily with a meal.    . progesterone (PROMETRIUM) 100 MG capsule Take 1 capsule (100 mg total) by mouth daily. 30 capsule 11  . sertraline (ZOLOFT) 100 MG tablet Take 100 mg by mouth Daily.     . valACYclovir (VALTREX) 1000 MG tablet Take 1 tablet by mouth as needed.     Marland Kitchen  zolpidem (AMBIEN) 10 MG tablet Take 10 mg by mouth at bedtime as needed for sleep.     No current facility-administered medications for this visit.    Family History  Problem Relation Age of Onset  . Osteoporosis Mother   . Heart attack Mother   . Alzheimer's disease Mother   . Diabetes Father   . Hypertension Father   . Ovarian cancer Sister     ROS:  Pertinent items are noted in HPI.  Otherwise, a comprehensive ROS was negative.  Exam:   BP 120/66 mmHg  Pulse 60  Resp 18  Ht 5\' 6"  (1.676 m)  Wt 177 lb 6.4 oz (80.468 kg)  BMI 28.65 kg/m2  LMP 09/03/2011    General appearance: alert, cooperative and appears stated age Head: Normocephalic, without obvious abnormality, atraumatic Neck: no adenopathy, supple, symmetrical, trachea midline and thyroid normal to inspection and palpation Lungs: clear to auscultation bilaterally Breasts: normal appearance, no  masses or tenderness, Inspection negative, No nipple retraction or dimpling, No nipple discharge or bleeding, No axillary or supraclavicular adenopathy Heart: regular rate and rhythm Abdomen: soft, non-tender; bowel sounds normal; no masses,  no organomegaly Extremities: extremities normal, atraumatic, no cyanosis or edema Skin: Skin color, texture, turgor normal. No rashes or lesions Lymph nodes: Cervical, supraclavicular, and axillary nodes normal. No abnormal inguinal nodes palpated Neurologic: Grossly normal  Pelvic: External genitalia:  no lesions              Urethra:  normal appearing urethra with no masses, tenderness or lesions              Bartholins and Skenes: normal                 Vagina: normal appearing vagina with normal color and discharge, no lesions.  First degree cystocele.              Cervix: no lesions              Pap taken: Yes.   Bimanual Exam:  Uterus:  normal size, contour, position, consistency, mobility, non-tender              Adnexa: normal adnexa and no mass, fullness, tenderness              Rectovaginal: Yes.  .  Confirms.              Anus:  normal sphincter tone, no lesions  Chaperone was present for exam.  Assessment:   Well woman visit with normal exam. Hx abnormal pap and normal colpo biopsy.  HRT patient.  First degree cystocele and mild GSI.  Plan: Yearly mammogram recommended after age 50.   Will call [facility] for report for 2016 study/studies. Recommended self breast exam.  Pap and HR HPV as above. Discussed Calcium, Vitamin D, regular exercise program including cardiovascular and weight bearing exercise. Labs performed.  No..   See orders. Refills given on medications.  Yes.  .  See orders.  Minivelle 0.0375 twice weekly and Prometrium 100 mg daily. Discussed risks of DVt, PE, MI, and stroke. Wishes to continue.  Encouraged weight loss to help GSI and reduce cardiovascular risks. Follow up annually and prn.    After visit summary  provided.

## 2015-02-20 NOTE — Patient Instructions (Signed)

## 2015-02-22 ENCOUNTER — Encounter: Payer: Self-pay | Admitting: Obstetrics and Gynecology

## 2015-02-23 LAB — IPS PAP TEST WITH HPV

## 2015-03-01 ENCOUNTER — Ambulatory Visit: Payer: Commercial Managed Care - PPO | Admitting: Obstetrics and Gynecology

## 2015-03-10 ENCOUNTER — Telehealth: Payer: Self-pay | Admitting: Obstetrics and Gynecology

## 2015-03-10 NOTE — Telephone Encounter (Signed)
Patient says picked up her prescription for estradiol and was only given 4 patches. Patient say Dr.Silva changed her dosage to 2 patches per week instead of 1 patch per week. Last seen 02/20/15.

## 2015-03-10 NOTE — Telephone Encounter (Signed)
Called to Journalist, newspaper at [employer]. She states that patient was dispensed "generic Minivelle" 0.0375 mg patches on 03/05/15.  She states that their supplier dispenses generic minivelle in boxes of 4.  Advised that this is not my experience and typically, boxes of Minivelle are dispensed in quantity of 8 for one month supply.  She states she will credit patient with one box and contact patient to advise will need additional box.  Call to patient, she read from the box of what she states she was given on 7/3. Patient states box states Generic Climara. Advised patient to call/return to pharmacy to discuss that she was possibly dispensed Climara when needs Minivelle.   Dr. Quincy Simmonds, okay to place new order to dispense 24 patches for 3 month supply?

## 2015-03-10 NOTE — Telephone Encounter (Signed)
Ok to dispense Minivelle 0.0375 mg twice weekly. #24, RF 3.

## 2015-03-13 MED ORDER — ESTRADIOL 0.0375 MG/24HR TD PTTW: 1.0000 | MEDICATED_PATCH | TRANSDERMAL | Status: DC

## 2015-03-13 NOTE — Telephone Encounter (Signed)
Rx for Minivelle 0.0375 mg twice weekly #24 RF 3 sent to [facility] on file. Spoke with patient. Advised new rx has been sent in. Patient is agreeable.  Routing to provider for final review. Patient agreeable to disposition. Will close encounter.

## 2015-08-02 ENCOUNTER — Telehealth: Payer: Self-pay | Admitting: Obstetrics and Gynecology

## 2015-08-02 NOTE — Telephone Encounter (Signed)
I would really like for her to see dermatology.  Please let me know if she needs a referral.  Thanks.

## 2015-08-02 NOTE — Telephone Encounter (Signed)
Patient is having extreme hair loss and wondering if this could be hormonal.

## 2015-08-02 NOTE — Telephone Encounter (Signed)
Call to patient. She states that for approximately two months she has noticed hair loss and is now experiencing hair loss in larger thicker patches on her scalp, with some balding areas. She states she has been dieting and has lost 10 lbs on nutrisystem. Patient states she feels her nails are more brittle as well. Denies fatigue. PCP has done labs in the last few months, she will fax those over this afternoon for review but wonders if Dr. Quincy Simmonds has any other recommendations. Advised will forward labs to Dr. Quincy Simmonds for review and call back with response from Dr. Quincy Simmonds. Patient agreeable.

## 2015-08-03 NOTE — Telephone Encounter (Signed)
Call to patient. Advised of message from Dr. Quincy Simmonds.  Patient is established with Dr. Ubaldo Glassing at [facility]. She will call their office and schedule an appointment. She will call back if she needs a referral placed.  Routing to provider for final review. Patient agreeable to disposition. Will close encounter.

## 2015-10-03 ENCOUNTER — Telehealth: Payer: Self-pay | Admitting: Obstetrics and Gynecology

## 2015-10-03 MED ORDER — ESTRADIOL 0.5 MG PO TABS: 0.5000 mg | ORAL_TABLET | Freq: Every day | ORAL | Status: DC

## 2015-10-03 NOTE — Telephone Encounter (Signed)
The patient is currently on Minivelle 0.0375 mg patches twice per week with Prometrium 100 mg per day. The patient reports a painful rash after removing her patches. She is requesting an alternative at this time.   Routing to [place] for review and advise as Dr.Silva is out of the office today.  Cc: Dr.Silva

## 2015-10-03 NOTE — Telephone Encounter (Signed)
I would suggest switching to Estradiol 0.5 mg daily and continuing the Prometrium 100 mg daily.  The Estradiol should also be very affordable.  Please give refills until due for her annual exam.   Thank you!

## 2015-10-03 NOTE — Telephone Encounter (Signed)
Patient says she gets a painful rash when she takes off the estradiol patches. Wondering if there is anything else she can try.

## 2015-10-03 NOTE — Telephone Encounter (Signed)
Spoke with patient. Advised of message as seen below from [place]. She is agreeable and would like to try Estradiol at this time. Rx for Estradiol 0.5 mg tablets #30 5RF sent to the pharmacy on file. She is agreeable. Aware she will need to continue taking her Prometrium 100 mg daily.  Routing to provider for final review. Patient agreeable to disposition. Will close encounter.

## 2015-10-03 NOTE — Telephone Encounter (Signed)
Left message to call Kaitlyn at [phone]. 

## 2016-02-28 ENCOUNTER — Encounter: Payer: Self-pay | Admitting: Obstetrics and Gynecology

## 2016-02-28 ENCOUNTER — Ambulatory Visit (INDEPENDENT_AMBULATORY_CARE_PROVIDER_SITE_OTHER): Payer: Commercial Managed Care - PPO | Admitting: Obstetrics and Gynecology

## 2016-02-28 VITALS — BP 130/70 | HR 70 | Resp 18 | Ht 66.0 in | Wt 184.8 lb

## 2016-02-28 DIAGNOSIS — Z01419 Encounter for gynecological examination (general) (routine) without abnormal findings: Secondary | ICD-10-CM

## 2016-02-28 DIAGNOSIS — Z79890 Hormone replacement therapy (postmenopausal): Secondary | ICD-10-CM

## 2016-02-28 DIAGNOSIS — Z8041 Family history of malignant neoplasm of ovary: Secondary | ICD-10-CM

## 2016-02-28 DIAGNOSIS — Z0000 Encounter for general adult medical examination without abnormal findings: Secondary | ICD-10-CM

## 2016-02-28 LAB — POCT URINALYSIS DIPSTICK
Bilirubin, UA: NEGATIVE
Blood, UA: NEGATIVE
GLUCOSE UA: NEGATIVE
KETONES UA: NEGATIVE
Leukocytes, UA: NEGATIVE
Nitrite, UA: NEGATIVE
Protein, UA: NEGATIVE
Urobilinogen, UA: NEGATIVE
pH, UA: 5

## 2016-02-28 MED ORDER — ESTRADIOL 0.5 MG PO TABS: 0.5000 mg | ORAL_TABLET | Freq: Every day | ORAL | Status: DC

## 2016-02-28 MED ORDER — PROGESTERONE MICRONIZED 100 MG PO CAPS: 100.0000 mg | ORAL_CAPSULE | Freq: Every day | ORAL | Status: DC

## 2016-02-28 NOTE — Progress Notes (Signed)
Patient ID: Danielle Knapp, female   DOB: 1956/03/06, 60 y.o.   MRN: [id number] 60 y.o. [place] Married Caucasian female here for annual exam.    Patient is on Estrace, Prometrium and Estrace vaginal cream.  Started 5 years ago.  Works well to treat hot flashes.   Cholesterol and glucose normal with PCP.    Has cystocele and mild GSI. Some urgency.  Can have leakage if she does not make it to the bathroom on time.  Declines tx for mixed incontinence.   FH of sister with ovarian cancer.  Dx at age 60 yo. Survived with only surgical intervention but patient is unclear about the details. Not a lot of contact with her sister.  Maternal grandfather with prostate cancer metastatic to bone.  Mother's great aunt with breast cancer.   PCP:  Harlan Stains, MD   Patient's last menstrual period was 09/03/2011.           Sexually active: Yes.   female The current method of family planning is post menopausal status.    Exercising: Yes.    walking Smoker:  no  Health Maintenance: Pap:  02-21-15 Neg:Neg HR HPV History of abnormal Pap:  Yes, 2013 had colposcopy with Dr. Felicita Gage neg. No treatment to cervix. MMG:  10-24-15 3D/Density C/benign calcifications both breasts;also benign intramammary node(s) both breasts/Neg/BiRads2:[facility] Colonoscopy:  2009 benign polyps with [facility];next 2019 BMD:  03/2009  Result  Normal;with PCP TDaP:  Up to date with PCP Gardasil:   N/A Hep C:  PCP.  Screening Labs:  Hb today: PCP, Urine today: Neg   reports that she has never smoked. She has never used smokeless tobacco. She reports that she drinks about 1.8 oz of alcohol per week. She reports that she does not use illicit drugs.  Past Medical History  Diagnosis Date  . Depression   . Hypertension   . Kidney stones 1/11    spontaneously passed dx'd based on blood test & history of cancer  . Plantar fasciitis of left foot   . Tennis elbow     -right    Past Surgical History  Procedure Laterality  Date  . Diagnostic laparoscopy  1996  . Septoplasty  1970's    x2  . Anal sphincteroplasty  2004    hemorrhoids  . Tonsillectomy    . Cataract extraction, bilateral  2013  . Heel spur surgery Right 2015    Dr. Durward Fortes    Current Outpatient Prescriptions  Medication Sig Dispense Refill  . aspirin 81 MG tablet Take 81 mg by mouth daily.    . Biotin 5000 MCG TABS Take 1 tablet by mouth daily.    Marland Kitchen buPROPion (WELLBUTRIN XL) 300 MG 24 hr tablet Take 300 mg by mouth daily.  0  . Calcium Lactate 750 MG TABS Take 1 tablet by mouth daily.    Marland Kitchen estradiol (ESTRACE) 0.5 MG tablet Take 1 tablet (0.5 mg total) by mouth daily. 30 tablet 5  . fish oil-omega-3 fatty acids 1000 MG capsule Take 2,400 mg by mouth daily.    Marland Kitchen losartan (COZAAR) 50 MG tablet Take 50 mg by mouth daily.  0  . Multiple Vitamin (MULTIVITAMIN) tablet Take 1 tablet by mouth daily.    . progesterone (PROMETRIUM) 100 MG capsule Take 1 capsule (100 mg total) by mouth daily. 90 capsule 3  . sertraline (ZOLOFT) 100 MG tablet Take 100 mg by mouth Daily.     Marland Kitchen zolpidem (AMBIEN) 10 MG tablet Take 10  mg by mouth at bedtime as needed for sleep.    Marland Kitchen estradiol (ESTRACE VAGINAL) 0.1 MG/GM vaginal cream Place 1/2 gram in vagina at bedtime nightly for 2 weeks and then 1/2 gram twice weekly. (Patient not taking: Reported on 02/28/2016) 42.5 g 3   No current facility-administered medications for this visit.    Family History  Problem Relation Age of Onset  . Osteoporosis Mother   . Heart attack Mother   . Alzheimer's disease Mother   . Diabetes Father   . Hypertension Father   . Ovarian cancer Sister     ROS:  Pertinent items are noted in HPI.  Otherwise, a comprehensive ROS was negative.  Exam:   BP 130/70 mmHg  Pulse 70  Resp 18  Ht 5\' 6"  (1.676 m)  Wt 184 lb 12.8 oz (83.825 kg)  BMI 29.84 kg/m2  LMP 09/03/2011    General appearance: alert, cooperative and appears stated age Head: Normocephalic, without obvious abnormality,  atraumatic Neck: no adenopathy, supple, symmetrical, trachea midline and thyroid normal to inspection and palpation Lungs: clear to auscultation bilaterally Breasts: normal appearance, no masses or tenderness, Inspection negative, No nipple retraction or dimpling, No nipple discharge or bleeding, No axillary or supraclavicular adenopathy Heart: regular rate and rhythm Abdomen: Obese, incisions:  Yes.     Umbilical laparoscopic , soft, non-tender; no masses, no organomegaly Extremities: extremities normal, atraumatic, no cyanosis or edema Skin: Skin color, texture, turgor normal. No rashes or lesions Lymph nodes: Cervical, supraclavicular, and axillary nodes normal. No abnormal inguinal nodes palpated Neurologic: Grossly normal  Pelvic: External genitalia:  no lesions              Urethra:  normal appearing urethra with no masses, tenderness or lesions              Bartholins and Skenes: normal                 Vagina: normal appearing vagina with normal color and discharge, no lesions              Cervix: no lesions              Pap taken: No. Bimanual Exam:  Uterus:  normal size, contour, position, consistency, mobility, non-tender.  BM exam limited by [facility].              Adnexa: normal adnexa and no mass, fullness, tenderness              Rectal exam: Yes.  .  Confirms.              Anus:  normal sphincter tone, no lesions  Chaperone was present for exam.  Assessment:   Well woman visit with normal exam. HRT patient.  Vaginal atrophy.  Hx of abnormal pap.  No treatment.  Follow up paps normal.  First degree cystocele.  Mild GSI.  Overactive bladder. Sister with hx ovarian cancer.  Hx of mild endometriosis on laparoscopy in past.   Plan: Yearly mammogram recommended after age 71.  Recommended self breast exam.  Pap and HR HPV as above. Discussed Calcium, Vitamin D, regular exercise program including cardiovascular and weight bearing exercise. Labs performed.  No..      Prescription medication(s) given.  Yes.  .  See orders.  Estrace 0.5 mg and Prometrium 100 mg daily each.  Does not need refill of the estrogen cream. Discussed risks of DVT, PE, MI, stroke, and breast cancer.  She will try to  stop HRT next year.  Discussed Kegel"s and reduction of bladder irritants.  Discussed signs and symptoms of ovarian cancer.  Return for pelvic ultrasound to check ovaries.  I also discussed CA125.  This is not drawn today.  Follow up annually and prn.       After visit summary provided.

## 2016-03-01 ENCOUNTER — Other Ambulatory Visit: Payer: Self-pay | Admitting: Obstetrics and Gynecology

## 2016-03-01 NOTE — Telephone Encounter (Signed)
Medication refill request: Progesterone 100mg  Last AEX:  02/28/16 BS Next AEX: 03/19/17 Last MMG (if hormonal medication request): 10/24/15 BIRADS2 benign Refill authorized: 02/28/16 #30 w/11refills; already done  Refill denied today.

## 2016-03-14 ENCOUNTER — Ambulatory Visit (INDEPENDENT_AMBULATORY_CARE_PROVIDER_SITE_OTHER): Payer: Commercial Managed Care - PPO

## 2016-03-14 ENCOUNTER — Encounter: Payer: Self-pay | Admitting: Obstetrics and Gynecology

## 2016-03-14 ENCOUNTER — Ambulatory Visit (INDEPENDENT_AMBULATORY_CARE_PROVIDER_SITE_OTHER): Payer: Commercial Managed Care - PPO | Admitting: Obstetrics and Gynecology

## 2016-03-14 VITALS — BP 140/78 | HR 60 | Ht 66.0 in | Wt 183.0 lb

## 2016-03-14 DIAGNOSIS — Z8041 Family history of malignant neoplasm of ovary: Secondary | ICD-10-CM | POA: Diagnosis not present

## 2016-03-14 DIAGNOSIS — Z79890 Hormone replacement therapy (postmenopausal): Secondary | ICD-10-CM | POA: Diagnosis not present

## 2016-03-14 DIAGNOSIS — N859 Noninflammatory disorder of uterus, unspecified: Secondary | ICD-10-CM

## 2016-03-14 DIAGNOSIS — N9489 Other specified conditions associated with female genital organs and menstrual cycle: Secondary | ICD-10-CM | POA: Diagnosis not present

## 2016-03-14 NOTE — Progress Notes (Signed)
GYNECOLOGY  VISIT   HPI: 60 y.o.   Married  Caucasian  female   G0P0000 with Patient's last menstrual period was 09/03/2011.   here for   Pelvic ultrasound to check ovaries.  FH of sister with ovarian cancer. Dx at age 33 yo. Survived with only surgical intervention but patient is unclear about the details. Not a lot of contact with her sister.  Maternal grandfather with prostate cancer metastatic to bone.  Mother's great aunt with breast cancer.   Had EMB with Dr. Joan Flores in past.  Was very painful.  States she had a procedure in the past where a lot of fluid was suddenly noted. ?EMB?  GYNECOLOGIC HISTORY: Patient's last menstrual period was 09/03/2011. Pap 02/21/15 - normal and negative HR HPV.   No hx of abnormal paps.        OB History    Gravida Para Term Preterm AB TAB SAB Ectopic Multiple Living   0 0 0 0 0 0 0 0 0 0          Patient Active Problem List   Diagnosis Date Noted  . Depression   . Hypertension   . Polycythemia 08/11/2012    Past Medical History  Diagnosis Date  . Depression   . Hypertension   . Kidney stones 1/11    spontaneously passed dx'd based on blood test & history of cancer  . Plantar fasciitis of left foot   . Tennis elbow     -right    Past Surgical History  Procedure Laterality Date  . Diagnostic laparoscopy  1996  . Septoplasty  1970's    x2  . Anal sphincteroplasty  2004    hemorrhoids  . Tonsillectomy    . Cataract extraction, bilateral  2013  . Heel spur surgery Right 2015    Dr. Durward Fortes    Current Outpatient Prescriptions  Medication Sig Dispense Refill  . aspirin 81 MG tablet Take 81 mg by mouth daily.    . Biotin 5000 MCG TABS Take 1 tablet by mouth daily.    Marland Kitchen buPROPion (WELLBUTRIN XL) 300 MG 24 hr tablet Take 300 mg by mouth daily.  0  . Calcium Lactate 750 MG TABS Take 1 tablet by mouth daily.    Marland Kitchen estradiol (ESTRACE VAGINAL) 0.1 MG/GM vaginal cream Place 1/2 gram in vagina at bedtime nightly for 2 weeks and  then 1/2 gram twice weekly. 42.5 g 3  . estradiol (ESTRACE) 0.5 MG tablet Take 1 tablet (0.5 mg total) by mouth daily. 30 tablet 11  . fish oil-omega-3 fatty acids 1000 MG capsule Take 2,400 mg by mouth daily.    Marland Kitchen losartan (COZAAR) 50 MG tablet Take 50 mg by mouth daily.  0  . Multiple Vitamin (MULTIVITAMIN) tablet Take 1 tablet by mouth daily.    . progesterone (PROMETRIUM) 100 MG capsule Take 1 capsule (100 mg total) by mouth daily. 30 capsule 11  . sertraline (ZOLOFT) 100 MG tablet Take 100 mg by mouth Daily. Takes 150mg  daily    . zolpidem (AMBIEN) 5 MG tablet Take 1 tablet by mouth at bedtime.  0   No current facility-administered medications for this visit.     ALLERGIES: Erythromycin; Latex; and Nsaids  Family History  Problem Relation Age of Onset  . Osteoporosis Mother   . Heart attack Mother   . Alzheimer's disease Mother   . Diabetes Father   . Hypertension Father   . Ovarian cancer Sister     Social History  Social History  . Marital Status: Married    Spouse Name: N/A  . Number of Children: N/A  . Years of Education: N/A   Occupational History  . Not on file.   Social History Main Topics  . Smoking status: Never Smoker   . Smokeless tobacco: Never Used  . Alcohol Use: 1.8 oz/week    3 Glasses of wine per week  . Drug Use: No  . Sexual Activity:    Partners: Male    Birth Control/ Protection: Post-menopausal   Other Topics Concern  . Not on file   Social History Narrative    ROS:  Pertinent items are noted in HPI.  PHYSICAL EXAMINATION:    BP 140/78 mmHg  Pulse 60  Ht 5\' 6"  (1.676 m)  Wt 183 lb (83.008 kg)  BMI 29.55 kg/m2  LMP 09/03/2011    General appearance: alert, cooperative and appears stated age  ASSESSMENT  FH ovarian cancer.  Fluid in endometrial canal.  EMS 3.84 mm. Endometrial mass. Fluid collection in cervix consistent with possible hematometra. Patient is on HRT.   PLAN  Discussion of ultrasound findings and meaning  of endometrial mass and fluid.  Will proceed with EMB tomorrow and pretreat with Motrin 800 mg.  Will plan for local anesthetic.  Check CA125 today.    An After Visit Summary was printed and given to the patient.  __15___ minutes face to face time of which over 50% was spent in counseling.

## 2016-03-15 ENCOUNTER — Ambulatory Visit (INDEPENDENT_AMBULATORY_CARE_PROVIDER_SITE_OTHER): Payer: Commercial Managed Care - PPO | Admitting: Obstetrics and Gynecology

## 2016-03-15 ENCOUNTER — Encounter: Payer: Self-pay | Admitting: Obstetrics and Gynecology

## 2016-03-15 VITALS — BP 138/70 | HR 80 | Resp 16 | Ht 66.0 in | Wt 183.0 lb

## 2016-03-15 DIAGNOSIS — N9489 Other specified conditions associated with female genital organs and menstrual cycle: Secondary | ICD-10-CM

## 2016-03-15 DIAGNOSIS — N882 Stricture and stenosis of cervix uteri: Secondary | ICD-10-CM

## 2016-03-15 DIAGNOSIS — N859 Noninflammatory disorder of uterus, unspecified: Secondary | ICD-10-CM

## 2016-03-15 LAB — CA 125: CA 125: 6 U/mL (ref <35)

## 2016-03-15 NOTE — Progress Notes (Signed)
GYNECOLOGY  VISIT   HPI: 60 y.o.   Married  Caucasian  female   G0P0000 with Patient's last menstrual period was 09/03/2011.   here for  EMB. Had pelvic U/S yesterday for check of ovaries due to FH of ovarian cancer.  Has endometrial mass and small sliver of fluid.  Potential cervical fluid collection - hematometra? Ovaries were normal.   CA125 6.  Patient is on HRT.  No postmenopausal bleeding.   States she took Ibuprofen 1200 mg in preparation for visit today.  Was told to take only Ibuprofen 800 mg, but she was scared.   GYNECOLOGIC HISTORY: Patient's last menstrual period was 09/03/2011. Contraception:  Post Menopausal  Menopausal hormone therapy:  Estradiol and progesterone.  Last mammogram:  10/24/15 BIRADS2:benign  Last pap smear:   02/21/15 Neg. HR HPV:neg        OB History    Gravida Para Term Preterm AB TAB SAB Ectopic Multiple Living   0 0 0 0 0 0 0 0 0 0          Patient Active Problem List   Diagnosis Date Noted  . Depression   . Hypertension   . Polycythemia 08/11/2012    Past Medical History  Diagnosis Date  . Depression   . Hypertension   . Kidney stones 1/11    spontaneously passed dx'd based on blood test & history of cancer  . Plantar fasciitis of left foot   . Tennis elbow     -right    Past Surgical History  Procedure Laterality Date  . Diagnostic laparoscopy  1996  . Septoplasty  1970's    x2  . Anal sphincteroplasty  2004    hemorrhoids  . Tonsillectomy    . Cataract extraction, bilateral  2013  . Heel spur surgery Right 2015    Dr. Durward Fortes    Current Outpatient Prescriptions  Medication Sig Dispense Refill  . aspirin 81 MG tablet Take 81 mg by mouth daily.    . Biotin 5000 MCG TABS Take 1 tablet by mouth daily.    Marland Kitchen buPROPion (WELLBUTRIN XL) 300 MG 24 hr tablet Take 300 mg by mouth daily.  0  . Calcium Lactate 750 MG TABS Take 1 tablet by mouth daily.    Marland Kitchen estradiol (ESTRACE VAGINAL) 0.1 MG/GM vaginal cream Place 1/2 gram in  vagina at bedtime nightly for 2 weeks and then 1/2 gram twice weekly. 42.5 g 3  . estradiol (ESTRACE) 0.5 MG tablet Take 1 tablet (0.5 mg total) by mouth daily. 30 tablet 11  . fish oil-omega-3 fatty acids 1000 MG capsule Take 2,400 mg by mouth daily.    Marland Kitchen losartan (COZAAR) 50 MG tablet Take 50 mg by mouth daily.  0  . Multiple Vitamin (MULTIVITAMIN) tablet Take 1 tablet by mouth daily.    . progesterone (PROMETRIUM) 100 MG capsule Take 1 capsule (100 mg total) by mouth daily. 30 capsule 11  . sertraline (ZOLOFT) 100 MG tablet Take 100 mg by mouth Daily. Takes 150mg  daily    . zolpidem (AMBIEN) 5 MG tablet Take 1 tablet by mouth at bedtime.  0   No current facility-administered medications for this visit.     ALLERGIES: Erythromycin; Latex; and Nsaids  Family History  Problem Relation Age of Onset  . Osteoporosis Mother   . Heart attack Mother   . Alzheimer's disease Mother   . Diabetes Father   . Hypertension Father   . Ovarian cancer Sister     Social  History   Social History  . Marital Status: Married    Spouse Name: N/A  . Number of Children: N/A  . Years of Education: N/A   Occupational History  . Not on file.   Social History Main Topics  . Smoking status: Never Smoker   . Smokeless tobacco: Never Used  . Alcohol Use: 1.8 oz/week    3 Glasses of wine per week  . Drug Use: No  . Sexual Activity:    Partners: Male    Birth Control/ Protection: Post-menopausal   Other Topics Concern  . Not on file   Social History Narrative    ROS:  Pertinent items are noted in HPI.  PHYSICAL EXAMINATION:    BP 138/70 mmHg  Pulse 80  Resp 16  Ht 5\' 6"  (1.676 m)  Wt 183 lb (83.008 kg)  BMI 29.55 kg/m2  LMP 09/03/2011    General appearance: alert, cooperative and appears stated age    Pelvic: External genitalia:  no lesions              Urethra:  normal appearing urethra with no masses, tenderness or lesions              Bartholins and Skenes: normal                  Vagina: normal appearing vagina with normal color and discharge, no lesions              Cervix: no lesions and cervical stenosis noted.          Bimanual Exam:  Uterus:  normal size, contour, position, consistency, mobility, non-tender              Adnexa: normal adnexa and no mass, fullness, tenderness         Procedure - endometrial biopsy Consent performed. Speculum place in vagina.  Sterile prep of cervix with betadine.  Tenaculum to anterior cervical lip. Paracervical block with 10 cc 1% lidocaine - yes.  Lot number [id number], expiration 10/31/16.  11 blade scalpel used to open the cervical os.  Large amount of clear mucous expressed from the os and collected as part of the EMB specimen. Pipelle placed to    7      cm without difficulty twice. Tissue obtained and sent to pathology. Speculum removed.  No complications. Minimal EBL.  Chaperone was present for exam.  ASSESSMENT  Cervical stenosis.  Endometrial fluid collection and mass.  Cervical fluid collection.  HRT patient.  FH of ovarian cancer.  Normal ovaries and CA 125.  PLAN  Discussion of findings including cervical stenosis.  Follow up EMB. Precautions given.  Final plan to follow after EMB has returned.     An After Visit Summary was printed and given to the patient.

## 2016-03-15 NOTE — Patient Instructions (Signed)

## 2016-03-19 LAB — IPS OTHER TISSUE BIOPSY

## 2016-03-20 ENCOUNTER — Telehealth: Payer: Self-pay | Admitting: Obstetrics and Gynecology

## 2016-03-20 ENCOUNTER — Encounter: Payer: Self-pay | Admitting: Obstetrics and Gynecology

## 2016-03-20 NOTE — Telephone Encounter (Signed)
Non-Urgent Medical Question  Message [phone]   From  Verlena Siener Chamber   To  Nunzio Cobbs, MD   Sent  03/20/2016 7:12 AM     I am unable to get into my test result for my biopsy results. How do I do that. It says to see Progress Note but I don't know how to do that.   Debbie Wickens      Responsible Party    Pool - [facility] Clinical Pool No one has taken responsibility for this message.     No actions have been taken on this message.     Routing to Dr.Silva for review and advise of biopsy results from 03/15/2016. Results are available in EPIC.

## 2016-03-20 NOTE — Telephone Encounter (Signed)
Patient has some questions regarding her results on MyChart.

## 2016-03-20 NOTE — Telephone Encounter (Signed)
Spoke with patient. Advised of results and message as seen below from [place]. Patient is agreeable and verbalizes understanding.   Notes Recorded by Nunzio Cobbs, MD on 03/20/2016 at 12:52 PM Please report EMB results showing weakly proliferative endometrium and lots of mucous (because of the cervical stenosis it was trapped inside). No polyp, precancer, or cancer were seen.  She may continue her HRT.  No further therapy is needed.  Please call for any vaginal bleeding.   [place] to provider for final review. Patient agreeable to disposition. Will close encounter.

## 2016-03-20 NOTE — Telephone Encounter (Signed)
Telephone call created to discuss biopsy results with Dr.Silva.

## 2016-03-20 NOTE — Telephone Encounter (Signed)
See separate result note with recommendations.  EMB benign.

## 2016-05-08 ENCOUNTER — Other Ambulatory Visit: Payer: Self-pay | Admitting: Family Medicine

## 2016-05-08 ENCOUNTER — Other Ambulatory Visit: Payer: Self-pay

## 2016-05-08 ENCOUNTER — Ambulatory Visit
Admission: RE | Admit: 2016-05-08 | Discharge: 2016-05-08 | Disposition: A | Discharge status: Home / Self Care | Payer: Commercial Managed Care - PPO | Source: Ambulatory Visit | Attending: Family Medicine | Admitting: Family Medicine

## 2016-05-08 DIAGNOSIS — R102 Pelvic and perineal pain: Secondary | ICD-10-CM

## 2016-05-08 DIAGNOSIS — R109 Unspecified abdominal pain: Secondary | ICD-10-CM

## 2016-05-08 MED ORDER — IOPAMIDOL (ISOVUE-300) INJECTION 61%
100.0000 mL | Freq: Once | INTRAVENOUS | Status: AC | PRN
  Administered 2016-05-08: 100 mL via INTRAVENOUS

## 2016-05-08 MED ORDER — IOHEXOL 300 MG/ML  SOLN
30.0000 mL | Freq: Once | INTRAMUSCULAR | Status: AC | PRN
  Administered 2016-05-08: 30 mL via ORAL

## 2016-07-24 ENCOUNTER — Ambulatory Visit
Admission: RE | Admit: 2016-07-24 | Discharge: 2016-07-24 | Disposition: A | Discharge status: Home / Self Care | Payer: Commercial Managed Care - PPO | Source: Ambulatory Visit | Attending: Family Medicine | Admitting: Family Medicine

## 2016-07-24 ENCOUNTER — Other Ambulatory Visit: Payer: Self-pay | Admitting: Family Medicine

## 2016-07-24 DIAGNOSIS — M25512 Pain in left shoulder: Secondary | ICD-10-CM

## 2016-10-04 DIAGNOSIS — G8929 Other chronic pain: Secondary | ICD-10-CM | POA: Diagnosis not present

## 2016-10-04 DIAGNOSIS — M25512 Pain in left shoulder: Secondary | ICD-10-CM | POA: Diagnosis not present

## 2016-10-23 DIAGNOSIS — M8589 Other specified disorders of bone density and structure, multiple sites: Secondary | ICD-10-CM | POA: Diagnosis not present

## 2016-10-23 DIAGNOSIS — Z1231 Encounter for screening mammogram for malignant neoplasm of breast: Secondary | ICD-10-CM | POA: Diagnosis not present

## 2016-10-23 DIAGNOSIS — Z780 Asymptomatic menopausal state: Secondary | ICD-10-CM | POA: Diagnosis not present

## 2016-10-31 ENCOUNTER — Encounter: Payer: Self-pay | Admitting: Obstetrics and Gynecology

## 2016-11-04 ENCOUNTER — Telehealth: Payer: Self-pay | Admitting: *Deleted

## 2016-11-04 NOTE — Telephone Encounter (Signed)
Call to patient to review BMD results from exam date 10/23/16 as requested by Dr. Quincy Simmonds. Patient states she received message from pcp this morning, but has not returned call, would like Dr. Elza Rafter recommendations as well. Reviewed BMD results and recommendations per Dr. Quincy Simmonds, "Osteopenia of hip and spine. Fracture risk is still low. No RX indicated. Continue weight bearing exercise, Calcium 1200 mg daily, Vit D 800 IU daily". Patient verbalizes understanding and is agreeable.  Copy of BMD results to scan.  Routing to provider for final review. Patient is agreeable to disposition. Will close encounter.

## 2016-11-11 DIAGNOSIS — G8929 Other chronic pain: Secondary | ICD-10-CM | POA: Diagnosis not present

## 2016-11-11 DIAGNOSIS — M25512 Pain in left shoulder: Secondary | ICD-10-CM | POA: Diagnosis not present

## 2016-11-11 DIAGNOSIS — M19012 Primary osteoarthritis, left shoulder: Secondary | ICD-10-CM | POA: Diagnosis not present

## 2016-12-29 ENCOUNTER — Emergency Department
Admission: EM | Admit: 2016-12-29 | Discharge: 2016-12-29 | Disposition: A | Discharge status: Home / Self Care | Payer: Commercial Managed Care - PPO | Attending: Emergency Medicine | Admitting: Emergency Medicine

## 2016-12-29 ENCOUNTER — Encounter: Payer: Self-pay | Admitting: Physician Assistant

## 2016-12-29 ENCOUNTER — Emergency Department: Payer: Commercial Managed Care - PPO

## 2016-12-29 DIAGNOSIS — X501XXA Overexertion from prolonged static or awkward postures, initial encounter: Secondary | ICD-10-CM | POA: Insufficient documentation

## 2016-12-29 DIAGNOSIS — Y929 Unspecified place or not applicable: Secondary | ICD-10-CM | POA: Insufficient documentation

## 2016-12-29 DIAGNOSIS — M6789 Other specified disorders of synovium and tendon, multiple sites: Secondary | ICD-10-CM

## 2016-12-29 DIAGNOSIS — S66301A Unspecified injury of extensor muscle, fascia and tendon of left index finger at wrist and hand level, initial encounter: Secondary | ICD-10-CM | POA: Insufficient documentation

## 2016-12-29 DIAGNOSIS — M20012 Mallet finger of left finger(s): Secondary | ICD-10-CM | POA: Diagnosis not present

## 2016-12-29 DIAGNOSIS — Y999 Unspecified external cause status: Secondary | ICD-10-CM | POA: Diagnosis not present

## 2016-12-29 DIAGNOSIS — S6992XA Unspecified injury of left wrist, hand and finger(s), initial encounter: Secondary | ICD-10-CM | POA: Diagnosis present

## 2016-12-29 DIAGNOSIS — Z79899 Other long term (current) drug therapy: Secondary | ICD-10-CM | POA: Insufficient documentation

## 2016-12-29 DIAGNOSIS — Y9389 Activity, other specified: Secondary | ICD-10-CM | POA: Diagnosis not present

## 2016-12-29 DIAGNOSIS — I10 Essential (primary) hypertension: Secondary | ICD-10-CM | POA: Diagnosis not present

## 2016-12-29 DIAGNOSIS — M79645 Pain in left finger(s): Secondary | ICD-10-CM | POA: Diagnosis not present

## 2016-12-29 MED ORDER — NAPROXEN 500 MG PO TABS
500.0000 mg | ORAL_TABLET | Freq: Once | ORAL | Status: AC
  Administered 2016-12-29: 500 mg via ORAL
  Filled 2016-12-29: qty 1

## 2016-12-29 NOTE — ED Triage Notes (Signed)
Pt reports that her finger got caught in her clothing while she was changing and felt is snap, pt has a deformity noted to the distal tip of her left index finger

## 2016-12-29 NOTE — Discharge Instructions (Signed)
You have sustained a traumatic rupture of the extensor tendon of the left index finger. This structure allows your to straighten (extend) you fingertip. There is no x-ray evidence of bony injury. Wear the finger splint until you are evaluated by ortho. Limit you time out of the splint, except as necessary to wash the finger or replace the tape. Take Aleve as needed for pain relief. Apply ice packs over the splint for swelling. Follow-up with [facility] or consider [facility].

## 2016-12-29 NOTE — ED Provider Notes (Signed)
[facility] Emergency Department Provider Note ____________________________________________  Time seen: 1932  I have reviewed the triage vital signs and the nursing notes.  HISTORY  Chief Complaint  Finger Injury  HPI Danielle Knapp is a 61 y.o. female presents To the ED for evaluation of left index finger injury with deformity. She describes her finger actually getting caught in her clothing when she was changing clothes, and felt an immediate pop. She's had some deformity and disability to the tip of the left index finger since that time.She presents with a flexion deformity of the DIP of her left index finger.   Past Medical History:  Diagnosis Date  . Depression   . Hypertension   . Kidney stones 1/11   spontaneously passed dx'd based on blood test & history of cancer  . Plantar fasciitis of left foot   . Stenosis of cervix   . Tennis elbow    -right    Patient Active Problem List   Diagnosis Date Noted  . Depression   . Hypertension   . Polycythemia 08/11/2012    Past Surgical History:  Procedure Laterality Date  . ANAL SPHINCTEROPLASTY  2004   hemorrhoids  . CATARACT EXTRACTION, BILATERAL  2013  . DIAGNOSTIC LAPAROSCOPY  1996  . HEEL SPUR SURGERY Right 2015   Dr. Durward Fortes  . SEPTOPLASTY  1970's   x2  . TONSILLECTOMY      Prior to Admission medications   Medication Sig Start Date End Date Taking? Authorizing Provider  aspirin 81 MG tablet Take 81 mg by mouth daily.    Historical Provider, MD  Biotin 5000 MCG TABS Take 1 tablet by mouth daily.    Historical Provider, MD  buPROPion (WELLBUTRIN XL) 300 MG 24 hr tablet Take 300 mg by mouth daily. 02/04/15   Historical Provider, MD  Calcium Lactate 750 MG TABS Take 1 tablet by mouth daily.    Historical Provider, MD  estradiol (ESTRACE VAGINAL) 0.1 MG/GM vaginal cream Place 1/2 gram in vagina at bedtime nightly for 2 weeks and then 1/2 gram twice weekly. 02/20/15   Morehouse, MD   estradiol (ESTRACE) 0.5 MG tablet Take 1 tablet (0.5 mg total) by mouth daily. 02/28/16   Brook Oletta Lamas, MD  fish oil-omega-3 fatty acids 1000 MG capsule Take 2,400 mg by mouth daily.    Historical Provider, MD  losartan (COZAAR) 50 MG tablet Take 50 mg by mouth daily. 01/31/16   Historical Provider, MD  Multiple Vitamin (MULTIVITAMIN) tablet Take 1 tablet by mouth daily.    Historical Provider, MD  progesterone (PROMETRIUM) 100 MG capsule Take 1 capsule (100 mg total) by mouth daily. 02/28/16   Brook Oletta Lamas, MD  sertraline (ZOLOFT) 100 MG tablet Take 100 mg by mouth Daily. Takes 150mg  daily 07/22/12   Historical Provider, MD  zolpidem (AMBIEN) 5 MG tablet Take 1 tablet by mouth at bedtime. 03/01/16   Historical Provider, MD   Allergies Erythromycin; Latex; Nsaids; Ciprofloxacin hcl; and Flagyl [metronidazole]  Family History  Problem Relation Age of Onset  . Osteoporosis Mother   . Heart attack Mother   . Alzheimer's disease Mother   . Diabetes Father   . Hypertension Father   . Ovarian cancer Sister     Social History Social History  Substance Use Topics  . Smoking status: Never Smoker  . Smokeless tobacco: Never Used  . Alcohol use 1.8 oz/week    3 Glasses of wine  per week    Review of Systems  Constitutional: Negative for fever. Cardiovascular: Negative for chest pain. Respiratory: Negative for shortness of breath. Musculoskeletal: Negative for back pain. Left index finger injury as above Skin: Negative for rash. Neurological: Negative for headaches, focal weakness or numbness. ____________________________________________  PHYSICAL EXAM:  VITAL SIGNS: ED Triage Vitals  Enc Vitals Group     BP 12/29/16 1829 (!) 149/80     Pulse Rate 12/29/16 1829 67     Resp 12/29/16 1829 18     Temp 12/29/16 1829 98 F (36.7 C)     Temp Source 12/29/16 1829 Oral     SpO2 12/29/16 1829 99 %     Weight 12/29/16 1830 180 lb (81.6 kg)     Height 12/29/16 1830  5\' 7"  (1.702 m)     Head Circumference --      Peak Flow --      Pain Score 12/29/16 1829 4     Pain Loc --      Pain Edu? --      Excl. in [place]? --     Constitutional: Alert and oriented. Well appearing and in no distress. Head: Normocephalic and atraumatic. Cardiovascular: Normal rate, regular rhythm. Normal distal pulses. Respiratory: Normal respiratory effort. No wheezes/rales/rhonchi. Musculoskeletal: Left index finger with a fixed flexion deformity at the DIP. Patient is unable to extend the distal phalanx actively. There is some subtle dorsal swelling noted at the DIP. Nontender with normal range of motion in all extremities.  Neurologic:  Normal gross sensation. Normal speech and language. No gross focal neurologic deficits are appreciated. Skin:  Skin is warm, dry and intact. No rash noted. ____________________________________________   RADIOLOGY  Left Index Finger IMPRESSION: Flexion at the DIP joint. Clinical correlation is recommended regarding chronicity. This may be related to ligamentous injury.  I, Monte Zinni, Dannielle Karvonen, personally viewed and evaluated these images (plain radiographs) as part of my medical decision making, as well as reviewing the written report by the radiologist. ____________________________________________  PROCEDURES  Naproxen 500 mg PO Static Finger Splint  SPLINT APPLICATION Date/Time: 3:40 PM Authorized by: Melvenia Needles Consent: Verbal consent obtained. Risks and benefits: risks, benefits and alternatives were discussed Consent given by: patient Splint applied by: K. Walker, RN Location details: left index Splint type: volar finger splint with slight DIP extension Supplies used: alumofoam Post-procedure: The splinted body part was neurovascularly unchanged following the procedure. Patient tolerance: Patient tolerated the procedure well with no immediate  complications. ____________________________________________  INITIAL IMPRESSION / ASSESSMENT AND PLAN / ED COURSE  Patient with initial management of an acute extensor tendon disruption to the left index finger, resulting in a mallet finger deformity. She was found not have any acute bony injury on x-ray. The finger is placed in slight extension and finger splint is placed. She will follow-up with orthotopic urgent care tomorrow for reevaluation. She will take Aleve for pain and apply ice to the splint as discussed. Return precautions are reviewed. ____________________________________________  FINAL CLINICAL IMPRESSION(S) / ED DIAGNOSES  Final diagnoses:  Mallet deformity of left index finger  Extensor tendon disruption      Melvenia Needles, PA-C 12/29/16 2117    Carrie Mew, MD 12/30/16 2315

## 2016-12-31 DIAGNOSIS — M20012 Mallet finger of left finger(s): Secondary | ICD-10-CM | POA: Diagnosis not present

## 2017-01-08 DIAGNOSIS — H10503 Unspecified blepharoconjunctivitis, bilateral: Secondary | ICD-10-CM | POA: Diagnosis not present

## 2017-01-28 DIAGNOSIS — M20012 Mallet finger of left finger(s): Secondary | ICD-10-CM | POA: Diagnosis not present

## 2017-01-30 DIAGNOSIS — I10 Essential (primary) hypertension: Secondary | ICD-10-CM | POA: Diagnosis not present

## 2017-02-10 DIAGNOSIS — H10501 Unspecified blepharoconjunctivitis, right eye: Secondary | ICD-10-CM | POA: Diagnosis not present

## 2017-03-04 DIAGNOSIS — M20012 Mallet finger of left finger(s): Secondary | ICD-10-CM | POA: Diagnosis not present

## 2017-03-13 DIAGNOSIS — M20012 Mallet finger of left finger(s): Secondary | ICD-10-CM | POA: Diagnosis not present

## 2017-03-17 ENCOUNTER — Other Ambulatory Visit: Payer: Self-pay | Admitting: Obstetrics and Gynecology

## 2017-03-17 NOTE — Telephone Encounter (Signed)
Medication refill request: progesterone and estrace  Last AEX:  02/28/16 Dr. Quincy Simmonds  Next AEX: 03/19/17  Last MMG (if hormonal medication request): 10/23/16 BIRADS2:benign  Refill authorized: both 02/28/16 #30caps/11R. Today #30/0R?

## 2017-03-19 ENCOUNTER — Ambulatory Visit (INDEPENDENT_AMBULATORY_CARE_PROVIDER_SITE_OTHER): Payer: Commercial Managed Care - PPO | Admitting: Obstetrics and Gynecology

## 2017-03-19 ENCOUNTER — Ambulatory Visit: Payer: Commercial Managed Care - PPO | Admitting: Obstetrics and Gynecology

## 2017-03-19 ENCOUNTER — Encounter: Payer: Self-pay | Admitting: Obstetrics and Gynecology

## 2017-03-19 VITALS — BP 120/78 | HR 80 | Resp 16 | Ht 66.25 in | Wt 182.0 lb

## 2017-03-19 DIAGNOSIS — Z79890 Hormone replacement therapy (postmenopausal): Secondary | ICD-10-CM | POA: Diagnosis not present

## 2017-03-19 DIAGNOSIS — N393 Stress incontinence (female) (male): Secondary | ICD-10-CM | POA: Diagnosis not present

## 2017-03-19 DIAGNOSIS — Z01419 Encounter for gynecological examination (general) (routine) without abnormal findings: Secondary | ICD-10-CM | POA: Diagnosis not present

## 2017-03-19 MED ORDER — PROGESTERONE MICRONIZED 100 MG PO CAPS: 100.0000 mg | ORAL_CAPSULE | Freq: Every day | ORAL | 1 refills | Status: DC

## 2017-03-19 MED ORDER — ESTRADIOL 0.5 MG PO TABS: ORAL_TABLET | ORAL | 1 refills | Status: DC

## 2017-03-19 NOTE — Progress Notes (Signed)
61 y.o. G0P0000 Married Caucasian female here for annual exam.    Had [place] last year due to [place] ovarian cancer.  Normal ovaries.  Endometrium had a small echogenic focus and sliver of fluid.  EMS measured 3.84 mm.  EMB benign and showed proliferative endometrium.  Had evidence of cervical stenosis with large amount of mucous expressed with the dilation for the EMB. CA125 was normal (6).  Patient is on HRT.  Taking them for about 5 years to treat hot flashes and was having bleeding per patient.  Would like to be off of the HRT.   No bleeding or spotting.   Had diverticulitis last year and had allergic reaction to Flagyl and Cipro combination.   Has gained weight.   Has urinary urgency. Leaks with sneeze.  Has extra underwear at work.   Mother has dementia and is in a nursing home.  Stress at work.   PCP monitors her HTN, elevated hemoglobin (saw oncology and does not have polycythemia), anxiety, and labs. Donates blood every 2 months.   PCP:   Harlan Stains, MD   Patient's last menstrual period was 09/03/2011.           Sexually active: Yes.    The current method of family planning is post menopausal status.    Exercising: Yes.    walking Smoker:  no  Health Maintenance: Pap:  02/21/15 Pap and HR HPV negative History of abnormal Pap:  Yes, 2013 had colposcopy with Dr. Felicita Gage neg. No treatment to cervix. MMG:  10/23/16 BIRADS 2 benign/density c Colonoscopy:  2009 benign polyps with [facility];next 2019 BMD:   10/23/16  Result  Osteopenia TDaP:  UTD with PCP Hep C: discuss today Screening Labs: PCP takes care of labs   reports that she has never smoked. She has never used smokeless tobacco. She reports that she drinks about 1.8 oz of alcohol per week . She reports that she does not use drugs.  Past Medical History:  Diagnosis Date  . Depression   . Hypertension   . Kidney stones 1/11   spontaneously passed dx'd based on blood test & history of cancer  .  Plantar fasciitis of left foot   . Stenosis of cervix   . Tennis elbow    -right    Past Surgical History:  Procedure Laterality Date  . ANAL SPHINCTEROPLASTY  2004   hemorrhoids  . CATARACT EXTRACTION, BILATERAL  2013  . DIAGNOSTIC LAPAROSCOPY  1996  . FINGER SURGERY    . HEEL SPUR SURGERY Right 2015   Dr. Durward Fortes  . SEPTOPLASTY  1970's   x2  . TONSILLECTOMY      Current Outpatient Prescriptions  Medication Sig Dispense Refill  . aspirin 81 MG tablet Take 81 mg by mouth daily.    . Biotin 5000 MCG TABS Take 1 tablet by mouth daily.    Marland Kitchen buPROPion (WELLBUTRIN XL) 300 MG 24 hr tablet Take 300 mg by mouth daily.  0  . Calcium Lactate 750 MG TABS Take 1 tablet by mouth daily.    Marland Kitchen estradiol (ESTRACE VAGINAL) 0.1 MG/GM vaginal cream Place 1/2 gram in vagina at bedtime nightly for 2 weeks and then 1/2 gram twice weekly. 42.5 g 3  . estradiol (ESTRACE) 0.5 MG tablet take 1 tablet by mouth once daily 30 tablet 0  . fish oil-omega-3 fatty acids 1000 MG capsule Take 2,400 mg by mouth daily.    Marland Kitchen losartan (COZAAR) 50 MG tablet Take 50 mg by  mouth daily.  0  . Multiple Vitamin (MULTIVITAMIN) tablet Take 1 tablet by mouth daily.    . progesterone (PROMETRIUM) 100 MG capsule take 1 capsule by mouth once daily 30 capsule 0  . sertraline (ZOLOFT) 100 MG tablet Take 100 mg by mouth Daily. Takes 150mg  daily    . zolpidem (AMBIEN) 5 MG tablet Take 1 tablet by mouth at bedtime.  0   No current facility-administered medications for this visit.     Family History  Problem Relation Age of Onset  . Osteoporosis Mother   . Heart attack Mother   . Alzheimer's disease Mother   . Diabetes Father   . Hypertension Father   . Ovarian cancer Sister     ROS:  Pertinent items are noted in HPI.  Otherwise, a comprehensive ROS was negative.  Exam:   BP 120/78 (BP Location: Right Arm, Patient Position: Sitting, Cuff Size: Normal)   Pulse 80   Resp 16   Ht 5' 6.25" (1.683 m)   Wt 182 lb (82.6 kg)    LMP 09/03/2011   BMI 29.15 kg/m     General appearance: alert, cooperative and appears stated age Head: Normocephalic, without obvious abnormality, atraumatic Neck: no adenopathy, supple, symmetrical, trachea midline and thyroid normal to inspection and palpation Lungs: clear to auscultation bilaterally Breasts: normal appearance, no masses or tenderness, No nipple retraction or dimpling, No nipple discharge or bleeding, No axillary or supraclavicular adenopathy Heart: regular rate and rhythm Abdomen: soft, non-tender; no masses, no organomegaly Extremities: extremities normal, atraumatic, no cyanosis or edema Skin: Skin color, texture, turgor normal. No rashes or lesions Lymph nodes: Cervical, supraclavicular, and axillary nodes normal. No abnormal inguinal nodes palpated Neurologic: Grossly normal  Pelvic: External genitalia:  no lesions              Urethra:  normal appearing urethra with no masses, tenderness or lesions              Bartholins and Skenes: normal                 Vagina: normal appearing vagina with normal color and discharge, no lesions              Cervix: no lesions              Pap taken: Yes.   Bimanual Exam:  Uterus:  normal size, contour, position, consistency, mobility, non-tender              Adnexa: no mass, fullness, tenderness              Rectal exam: Yes.  .  Confirms.              Anus:  normal sphincter tone, no lesions  Chaperone was present for exam.  Assessment:   Well woman visit with normal exam. HRT.  Wants to wean off.  Stress incontinence.  Osteopenia.   Plan: Mammogram screening discussed. Recommended self breast awareness. Pap and HR HPV as above. Guidelines for Calcium, Vitamin D, regular exercise program including cardiovascular and weight bearing exercise. Will wean off HRT.  Take estrace 0.5 mg 1/2 tab daily and Prometrium 100 mg.  Discussed stress incontinence and urinary frequency.  Discussed PT and surgery.  Avoid  bladder irritants.  ACOG HOs on incontinence and surgery. Follow up annually and prn.     After visit summary provided.

## 2017-03-19 NOTE — Patient Instructions (Signed)

## 2017-03-20 DIAGNOSIS — S0091XA Abrasion of unspecified part of head, initial encounter: Secondary | ICD-10-CM | POA: Diagnosis not present

## 2017-03-20 DIAGNOSIS — M20012 Mallet finger of left finger(s): Secondary | ICD-10-CM | POA: Diagnosis not present

## 2017-03-25 DIAGNOSIS — M20012 Mallet finger of left finger(s): Secondary | ICD-10-CM | POA: Diagnosis not present

## 2017-04-08 DIAGNOSIS — M20012 Mallet finger of left finger(s): Secondary | ICD-10-CM | POA: Diagnosis not present

## 2017-04-09 DIAGNOSIS — L570 Actinic keratosis: Secondary | ICD-10-CM | POA: Diagnosis not present

## 2017-04-09 DIAGNOSIS — L738 Other specified follicular disorders: Secondary | ICD-10-CM | POA: Diagnosis not present

## 2017-04-09 DIAGNOSIS — L821 Other seborrheic keratosis: Secondary | ICD-10-CM | POA: Diagnosis not present

## 2017-04-09 DIAGNOSIS — D2261 Melanocytic nevi of right upper limb, including shoulder: Secondary | ICD-10-CM | POA: Diagnosis not present

## 2017-04-17 DIAGNOSIS — G8929 Other chronic pain: Secondary | ICD-10-CM | POA: Diagnosis not present

## 2017-04-17 DIAGNOSIS — M25512 Pain in left shoulder: Secondary | ICD-10-CM | POA: Diagnosis not present

## 2017-04-21 DIAGNOSIS — M20012 Mallet finger of left finger(s): Secondary | ICD-10-CM | POA: Diagnosis not present

## 2017-04-22 DIAGNOSIS — M20012 Mallet finger of left finger(s): Secondary | ICD-10-CM | POA: Diagnosis not present

## 2017-04-28 DIAGNOSIS — M19012 Primary osteoarthritis, left shoulder: Secondary | ICD-10-CM | POA: Diagnosis not present

## 2017-05-04 ENCOUNTER — Encounter: Payer: Self-pay | Admitting: Emergency Medicine

## 2017-05-04 ENCOUNTER — Emergency Department
Admission: EM | Admit: 2017-05-04 | Discharge: 2017-05-04 | Disposition: A | Discharge status: Home / Self Care | Payer: Commercial Managed Care - PPO | Attending: Emergency Medicine | Admitting: Emergency Medicine

## 2017-05-04 ENCOUNTER — Emergency Department: Payer: Commercial Managed Care - PPO

## 2017-05-04 DIAGNOSIS — K5792 Diverticulitis of intestine, part unspecified, without perforation or abscess without bleeding: Secondary | ICD-10-CM | POA: Diagnosis not present

## 2017-05-04 DIAGNOSIS — R109 Unspecified abdominal pain: Secondary | ICD-10-CM | POA: Diagnosis present

## 2017-05-04 DIAGNOSIS — I10 Essential (primary) hypertension: Secondary | ICD-10-CM | POA: Insufficient documentation

## 2017-05-04 DIAGNOSIS — Z91040 Latex allergy status: Secondary | ICD-10-CM | POA: Diagnosis not present

## 2017-05-04 DIAGNOSIS — Z79899 Other long term (current) drug therapy: Secondary | ICD-10-CM | POA: Insufficient documentation

## 2017-05-04 DIAGNOSIS — Z7982 Long term (current) use of aspirin: Secondary | ICD-10-CM | POA: Diagnosis not present

## 2017-05-04 DIAGNOSIS — N200 Calculus of kidney: Secondary | ICD-10-CM | POA: Diagnosis not present

## 2017-05-04 DIAGNOSIS — K5732 Diverticulitis of large intestine without perforation or abscess without bleeding: Secondary | ICD-10-CM | POA: Diagnosis not present

## 2017-05-04 HISTORY — DX: Diverticulitis of intestine, part unspecified, without perforation or abscess without bleeding: K57.92

## 2017-05-04 LAB — CBC
HEMATOCRIT: 44.4 % (ref 35.0–47.0)
HEMOGLOBIN: 16 g/dL (ref 12.0–16.0)
MCH: 32.2 pg (ref 26.0–34.0)
MCHC: 36 g/dL (ref 32.0–36.0)
MCV: 89.3 fL (ref 80.0–100.0)
Platelets: 252 10*3/uL (ref 150–440)
RBC: 4.98 MIL/uL (ref 3.80–5.20)
RDW: 12.4 % (ref 11.5–14.5)
WBC: 10.1 10*3/uL (ref 3.6–11.0)

## 2017-05-04 LAB — COMPREHENSIVE METABOLIC PANEL
ALT: 16 U/L (ref 14–54)
AST: 19 U/L (ref 15–41)
Albumin: 3.9 g/dL (ref 3.5–5.0)
Alkaline Phosphatase: 51 U/L (ref 38–126)
Anion gap: 8 (ref 5–15)
BILIRUBIN TOTAL: 0.6 mg/dL (ref 0.3–1.2)
BUN: 12 mg/dL (ref 6–20)
CHLORIDE: 109 mmol/L (ref 101–111)
CO2: 25 mmol/L (ref 22–32)
CREATININE: 0.72 mg/dL (ref 0.44–1.00)
Calcium: 8.9 mg/dL (ref 8.9–10.3)
Glucose, Bld: 126 mg/dL — ABNORMAL HIGH (ref 65–99)
Potassium: 3.5 mmol/L (ref 3.5–5.1)
Sodium: 142 mmol/L (ref 135–145)
Total Protein: 7.2 g/dL (ref 6.5–8.1)

## 2017-05-04 LAB — URINALYSIS, COMPLETE (UACMP) WITH MICROSCOPIC
BACTERIA UA: NONE SEEN
BILIRUBIN URINE: NEGATIVE
Glucose, UA: NEGATIVE mg/dL
Hgb urine dipstick: NEGATIVE
KETONES UR: NEGATIVE mg/dL
Nitrite: NEGATIVE
PROTEIN: NEGATIVE mg/dL
Specific Gravity, Urine: 1.023 (ref 1.005–1.030)
pH: 6 (ref 5.0–8.0)

## 2017-05-04 LAB — LIPASE, BLOOD: Lipase: 23 U/L (ref 11–51)

## 2017-05-04 MED ORDER — ONDANSETRON 4 MG PO TBDP: 4.0000 mg | ORAL_TABLET | Freq: Three times a day (TID) | ORAL | 0 refills | Status: DC | PRN

## 2017-05-04 MED ORDER — ONDANSETRON HCL 4 MG/2ML IJ SOLN
INTRAMUSCULAR | Status: AC
  Administered 2017-05-04: 4 mg via INTRAVENOUS
  Filled 2017-05-04: qty 2

## 2017-05-04 MED ORDER — ONDANSETRON HCL 4 MG/2ML IJ SOLN
4.0000 mg | Freq: Once | INTRAMUSCULAR | Status: AC
  Administered 2017-05-04: 4 mg via INTRAVENOUS

## 2017-05-04 MED ORDER — AMOXICILLIN-POT CLAVULANATE 875-125 MG PO TABS
1.0000 | ORAL_TABLET | Freq: Once | ORAL | Status: AC
  Administered 2017-05-04: 1 via ORAL
  Filled 2017-05-04 (×2): qty 1

## 2017-05-04 MED ORDER — AMOXICILLIN-POT CLAVULANATE 875-125 MG PO TABS: 1.0000 | ORAL_TABLET | Freq: Two times a day (BID) | ORAL | 0 refills | Status: AC

## 2017-05-04 MED ORDER — IOPAMIDOL (ISOVUE-300) INJECTION 61%
30.0000 mL | Freq: Once | INTRAVENOUS | Status: AC | PRN
  Administered 2017-05-04: 30 mL via ORAL
  Filled 2017-05-04: qty 30

## 2017-05-04 MED ORDER — OXYCODONE-ACETAMINOPHEN 5-325 MG PO TABS: 1.0000 | ORAL_TABLET | Freq: Four times a day (QID) | ORAL | 0 refills | Status: DC | PRN

## 2017-05-04 MED ORDER — IOPAMIDOL (ISOVUE-300) INJECTION 61%
100.0000 mL | Freq: Once | INTRAVENOUS | Status: AC | PRN
  Administered 2017-05-04: 100 mL via INTRAVENOUS
  Filled 2017-05-04: qty 100

## 2017-05-04 MED ORDER — MORPHINE SULFATE (PF) 4 MG/ML IV SOLN
4.0000 mg | Freq: Once | INTRAVENOUS | Status: AC
  Administered 2017-05-04: 4 mg via INTRAVENOUS

## 2017-05-04 MED ORDER — MORPHINE SULFATE (PF) 4 MG/ML IV SOLN
INTRAVENOUS | Status: AC
  Administered 2017-05-04: 4 mg via INTRAVENOUS
  Filled 2017-05-04: qty 1

## 2017-05-04 NOTE — ED Triage Notes (Signed)
C/O lower abdominal pain x 2 days.  Initially had some chills with symptoms.  Pain worse to LLQ.  Denies dysuria, N/V.  Patient has history of diverticulitis.

## 2017-05-04 NOTE — ED Notes (Signed)
FIRST NURSE NOTE:  C/o lower abdominal pain, has hx of diverticulitis.

## 2017-05-04 NOTE — ED Provider Notes (Signed)
[facility] Emergency Department Provider Note  Time seen: 2:49 PM  I have reviewed the triage vital signs and the nursing notes.   HISTORY  Chief Complaint Abdominal Pain    HPI Danielle Knapp is a 61 y.o. female With a past medical history of diverticulitis, hypertension, kidney stones, presents to the emergency department with left lower quadrant abdominal pain. According to the patient for the past 2 days she has been experiencing moderate left lower quadrant pain. She states Friday was the worst she continues to have moderate discomfort across the lower abdomen but worse in the left lower quadrant. Denies any dysuria or urinary frequency. Denies any nausea vomiting or diarrhea but does state increased frequency of normal bowel movements. Denies any black or bloody stool. Denies any fever. Describes her abdominal pain is moderate dull pain in the left lower quadrant 5/10 currently.  Past Medical History:  Diagnosis Date  . Depression   . Diverticulitis   . Hypertension   . Kidney stones 1/11   spontaneously passed dx'd based on blood test & history of cancer  . Plantar fasciitis of left foot   . Stenosis of cervix   . Tennis elbow    -right    Patient Active Problem List   Diagnosis Date Noted  . Depression   . Hypertension   . Polycythemia 08/11/2012    Past Surgical History:  Procedure Laterality Date  . ANAL SPHINCTEROPLASTY  2004   hemorrhoids  . CATARACT EXTRACTION, BILATERAL  2013  . DIAGNOSTIC LAPAROSCOPY  1996  . FINGER SURGERY    . HEEL SPUR SURGERY Right 2015   Dr. Durward Fortes  . SEPTOPLASTY  1970's   x2  . TONSILLECTOMY      Prior to Admission medications   Medication Sig Start Date End Date Taking? Authorizing Provider  aspirin 81 MG tablet Take 81 mg by mouth daily.    [provider]  Biotin 5000 MCG TABS Take 1 tablet by mouth daily.    [provider]  buPROPion (WELLBUTRIN XL) 300 MG 24 hr tablet Take  300 mg by mouth daily. 02/04/15   [provider]  Calcium Lactate 750 MG TABS Take 1 tablet by mouth daily.    [provider]  estradiol (ESTRACE VAGINAL) 0.1 MG/GM vaginal cream Place 1/2 gram in vagina at bedtime nightly for 2 weeks and then 1/2 gram twice weekly. 02/20/15   Nunzio Cobbs, MD  estradiol (ESTRACE) 0.5 MG tablet Take 1/2 tablet by mouth daily (0.25 mg). 03/19/17   Nunzio Cobbs, MD  fish oil-omega-3 fatty acids 1000 MG capsule Take 2,400 mg by mouth daily.    [provider]  losartan (COZAAR) 50 MG tablet Take 50 mg by mouth daily. 01/31/16   [provider]  Multiple Vitamin (MULTIVITAMIN) tablet Take 1 tablet by mouth daily.    [provider]  progesterone (PROMETRIUM) 100 MG capsule Take 1 capsule (100 mg total) by mouth daily. 03/19/17   Nunzio Cobbs, MD  sertraline (ZOLOFT) 100 MG tablet Take 100 mg by mouth Daily. Takes 150mg  daily 07/22/12   [provider]  zolpidem (AMBIEN) 5 MG tablet Take 1 tablet by mouth at bedtime. 03/01/16   [provider]    Allergies  Allergen Reactions  . Erythromycin Diarrhea  . Latex Rash  . Nsaids Cough    Can tolerate Ibuprofen.   . Ciprofloxacin Hcl Rash  . Flagyl [Metronidazole]  Rash    Family History  Problem Relation Age of Onset  . Osteoporosis Mother   . Heart attack Mother   . Alzheimer's disease Mother   . Diabetes Father   . Hypertension Father   . Ovarian cancer Sister     Social History Social History  Substance Use Topics  . Smoking status: Never Smoker  . Smokeless tobacco: Never Used  . Alcohol use 1.8 oz/week    3 Glasses of wine per week    Review of Systems Constitutional: Negative for fever Cardiovascular: Negative for chest pain. Respiratory: Negative for shortness of breath. Gastrointestinal: left lower quadrant pain. Genitourinary: Negative for dysuria. Neurological: Negative for headache All  other ROS negative  ____________________________________________   PHYSICAL EXAM:  VITAL SIGNS: ED Triage Vitals  Enc Vitals Group     BP 05/04/17 1422 134/68     Pulse Rate 05/04/17 1422 65     Resp 05/04/17 1422 16     Temp 05/04/17 1422 (!) 97.5 F (36.4 C)     Temp Source 05/04/17 1422 Oral     SpO2 05/04/17 1422 97 %     Weight 05/04/17 1419 180 lb (81.6 kg)     Height 05/04/17 1419 5\' 7"  (1.702 m)     Head Circumference --      Peak Flow --      Pain Score 05/04/17 1419 5     Pain Loc --      Pain Edu? --      Excl. in [place]? --     Constitutional: Alert and oriented. Well appearing and in no distress. Eyes: Normal exam ENT   Head: Normocephalic and atraumatic.   Mouth/Throat: Mucous membranes are moist. Cardiovascular: Normal rate, regular rhythm.  Respiratory: Normal respiratory effort without tachypnea nor retractions. Breath sounds are clear  Gastrointestinal: soft, moderate left lower quadrant tenderness palpation with mild suprapubic tenderness. No rebound or guarding. No distention. Musculoskeletal: Nontender with normal range of motion in all extremities.  Neurologic:  Normal speech and language. No gross focal neurologic deficits Skin:  Skin is warm, dry and intact.  Psychiatric: Mood and affect are normal.   ____________________________________________   RADIOLOGY  CT most compatible with diverticulitis.  ____________________________________________   INITIAL IMPRESSION / ASSESSMENT AND PLAN / ED COURSE  Pertinent labs & imaging results that were available during my care of the patient were reviewed by me and considered in my medical decision making (see chart for details).  patient presents to the emergency department left lower quadrant abdominal pain for the past 2 days. History of diverticulitis to which this feels somewhat similar. Labs are largely within normal limits including a normal white blood cell count. Urinalysis is pending.  We'll obtain a CT scan of the abdomen/pelvis given the moderate tenderness to palpation in the left lower quadrant with a history of diverticulitis 1 year ago.  CT shows sigmoid diverticulitis. Patient is allergic to ciprofloxacin, we will start on Augmentin twice daily discharge with pain and nausea medication and have the patient follow-up with her doctor. I discussed with normal diverticulitis return precautions. Patient agreeable.  ____________________________________________   FINAL CLINICAL IMPRESSION(S) / ED DIAGNOSES  left lower quadrant pain diverticulitis   Harvest Dark, MD 05/04/17 1649

## 2017-05-06 DIAGNOSIS — K5792 Diverticulitis of intestine, part unspecified, without perforation or abscess without bleeding: Secondary | ICD-10-CM | POA: Diagnosis not present

## 2017-08-08 ENCOUNTER — Other Ambulatory Visit: Payer: Self-pay | Admitting: Obstetrics and Gynecology

## 2017-08-08 NOTE — Telephone Encounter (Signed)
Spoke with patient and gave recommendations per Dr. Quincy Simmonds. Patient voiced understanding. She will call back with any problems or questions regarding her HRT-eh

## 2017-08-08 NOTE — Telephone Encounter (Signed)
Please contact patient about her HRT Rx.  She was going to wean off at her last office visit.  How is this going?

## 2017-08-08 NOTE — Telephone Encounter (Signed)
If she is ready to stop her HRT, now is a good time to do it due to the cold weather.  She needs to stop both the oral estrogen and the oral progesterone at the same time.  Call back for any problems.

## 2017-08-08 NOTE — Telephone Encounter (Signed)
Spoke with patient regarding her HRT. She states she is hot all the time but she is fine with it. She is  taking half of the estradiol but she is taking the whole progesterone still. She is okay with coming off of both if its advised, she is just not sure exactly how to wean off them.

## 2017-08-08 NOTE — Telephone Encounter (Signed)
Medication refill request: progesterone  Last AEX:  03-19-17  Next AEX: 7-19--19 Last MMG (if hormonal medication request): 10-23-16 WNL  Refill authorized: please advise

## 2017-08-18 DIAGNOSIS — I10 Essential (primary) hypertension: Secondary | ICD-10-CM | POA: Diagnosis not present

## 2017-08-18 DIAGNOSIS — Z0000 Encounter for general adult medical examination without abnormal findings: Secondary | ICD-10-CM | POA: Diagnosis not present

## 2017-08-18 DIAGNOSIS — D45 Polycythemia vera: Secondary | ICD-10-CM | POA: Diagnosis not present

## 2017-09-02 DIAGNOSIS — I219 Acute myocardial infarction, unspecified: Secondary | ICD-10-CM

## 2017-09-02 HISTORY — DX: Acute myocardial infarction, unspecified: I21.9

## 2017-09-16 DIAGNOSIS — M79671 Pain in right foot: Secondary | ICD-10-CM | POA: Diagnosis not present

## 2017-09-16 DIAGNOSIS — M7661 Achilles tendinitis, right leg: Secondary | ICD-10-CM | POA: Diagnosis not present

## 2017-09-16 DIAGNOSIS — M7731 Calcaneal spur, right foot: Secondary | ICD-10-CM | POA: Diagnosis not present

## 2017-10-13 DIAGNOSIS — M25562 Pain in left knee: Secondary | ICD-10-CM | POA: Diagnosis not present

## 2017-10-13 DIAGNOSIS — M19012 Primary osteoarthritis, left shoulder: Secondary | ICD-10-CM | POA: Diagnosis not present

## 2017-10-13 DIAGNOSIS — M7582 Other shoulder lesions, left shoulder: Secondary | ICD-10-CM | POA: Diagnosis not present

## 2017-10-13 DIAGNOSIS — M75112 Incomplete rotator cuff tear or rupture of left shoulder, not specified as traumatic: Secondary | ICD-10-CM | POA: Diagnosis not present

## 2017-10-14 ENCOUNTER — Observation Stay (HOSPITAL_COMMUNITY)
Admission: EM | Admit: 2017-10-14 | Discharge: 2017-10-16 | Disposition: A | Discharge status: Home / Self Care | Payer: Commercial Managed Care - PPO | Attending: Family Medicine | Admitting: Family Medicine

## 2017-10-14 ENCOUNTER — Encounter (HOSPITAL_COMMUNITY): Payer: Self-pay | Admitting: *Deleted

## 2017-10-14 ENCOUNTER — Other Ambulatory Visit: Payer: Self-pay

## 2017-10-14 ENCOUNTER — Emergency Department (HOSPITAL_COMMUNITY): Payer: Commercial Managed Care - PPO

## 2017-10-14 DIAGNOSIS — Z881 Allergy status to other antibiotic agents status: Secondary | ICD-10-CM | POA: Insufficient documentation

## 2017-10-14 DIAGNOSIS — D751 Secondary polycythemia: Secondary | ICD-10-CM | POA: Diagnosis not present

## 2017-10-14 DIAGNOSIS — R110 Nausea: Secondary | ICD-10-CM | POA: Diagnosis not present

## 2017-10-14 DIAGNOSIS — I10 Essential (primary) hypertension: Secondary | ICD-10-CM | POA: Diagnosis present

## 2017-10-14 DIAGNOSIS — I119 Hypertensive heart disease without heart failure: Secondary | ICD-10-CM | POA: Diagnosis not present

## 2017-10-14 DIAGNOSIS — Z886 Allergy status to analgesic agent status: Secondary | ICD-10-CM | POA: Diagnosis not present

## 2017-10-14 DIAGNOSIS — R0789 Other chest pain: Principal | ICD-10-CM | POA: Insufficient documentation

## 2017-10-14 DIAGNOSIS — Z8719 Personal history of other diseases of the digestive system: Secondary | ICD-10-CM | POA: Insufficient documentation

## 2017-10-14 DIAGNOSIS — R072 Precordial pain: Secondary | ICD-10-CM | POA: Diagnosis not present

## 2017-10-14 DIAGNOSIS — R0602 Shortness of breath: Secondary | ICD-10-CM | POA: Diagnosis not present

## 2017-10-14 DIAGNOSIS — R079 Chest pain, unspecified: Secondary | ICD-10-CM | POA: Diagnosis not present

## 2017-10-14 DIAGNOSIS — I700 Atherosclerosis of aorta: Secondary | ICD-10-CM | POA: Insufficient documentation

## 2017-10-14 DIAGNOSIS — Z8249 Family history of ischemic heart disease and other diseases of the circulatory system: Secondary | ICD-10-CM | POA: Insufficient documentation

## 2017-10-14 DIAGNOSIS — Z7982 Long term (current) use of aspirin: Secondary | ICD-10-CM | POA: Diagnosis not present

## 2017-10-14 DIAGNOSIS — Z79899 Other long term (current) drug therapy: Secondary | ICD-10-CM | POA: Diagnosis not present

## 2017-10-14 DIAGNOSIS — Z87442 Personal history of urinary calculi: Secondary | ICD-10-CM | POA: Insufficient documentation

## 2017-10-14 DIAGNOSIS — F329 Major depressive disorder, single episode, unspecified: Secondary | ICD-10-CM | POA: Diagnosis not present

## 2017-10-14 DIAGNOSIS — I160 Hypertensive urgency: Secondary | ICD-10-CM | POA: Diagnosis not present

## 2017-10-14 DIAGNOSIS — G4700 Insomnia, unspecified: Secondary | ICD-10-CM | POA: Insufficient documentation

## 2017-10-14 DIAGNOSIS — Z87898 Personal history of other specified conditions: Secondary | ICD-10-CM

## 2017-10-14 DIAGNOSIS — F32A Depression, unspecified: Secondary | ICD-10-CM | POA: Diagnosis present

## 2017-10-14 HISTORY — DX: Personal history of other specified conditions: Z87.898

## 2017-10-14 LAB — I-STAT TROPONIN, ED
TROPONIN I, POC: 0 ng/mL (ref 0.00–0.08)
Troponin i, poc: 0 ng/mL (ref 0.00–0.08)

## 2017-10-14 LAB — COMPREHENSIVE METABOLIC PANEL
ALK PHOS: 49 U/L (ref 38–126)
ALT: 19 U/L (ref 14–54)
AST: 20 U/L (ref 15–41)
Albumin: 4 g/dL (ref 3.5–5.0)
Anion gap: 11 (ref 5–15)
BILIRUBIN TOTAL: 0.9 mg/dL (ref 0.3–1.2)
BUN: 23 mg/dL — AB (ref 6–20)
CALCIUM: 9.3 mg/dL (ref 8.9–10.3)
CO2: 25 mmol/L (ref 22–32)
CREATININE: 0.82 mg/dL (ref 0.44–1.00)
Chloride: 106 mmol/L (ref 101–111)
GFR calc Af Amer: >60 mL/min (ref >60)
Glucose, Bld: 129 mg/dL — ABNORMAL HIGH (ref 65–99)
Potassium: 4 mmol/L (ref 3.5–5.1)
Sodium: 142 mmol/L (ref 135–145)
TOTAL PROTEIN: 6.5 g/dL (ref 6.5–8.1)

## 2017-10-14 LAB — CBC
HEMATOCRIT: 42.3 % (ref 36.0–46.0)
Hemoglobin: 15.6 g/dL — ABNORMAL HIGH (ref 12.0–15.0)
MCH: 32.9 pg (ref 26.0–34.0)
MCHC: 36.9 g/dL — ABNORMAL HIGH (ref 30.0–36.0)
MCV: 89.2 fL (ref 78.0–100.0)
Platelets: 259 10*3/uL (ref 150–400)
RBC: 4.74 MIL/uL (ref 3.87–5.11)
RDW: 12.8 % (ref 11.5–15.5)
WBC: 16.4 10*3/uL — ABNORMAL HIGH (ref 4.0–10.5)

## 2017-10-14 LAB — PROTIME-INR
INR: 1.09
Prothrombin Time: 14 seconds (ref 11.4–15.2)

## 2017-10-14 LAB — APTT: aPTT: 26 seconds (ref 24–36)

## 2017-10-14 MED ORDER — ASPIRIN 81 MG PO CHEW
324.0000 mg | CHEWABLE_TABLET | Freq: Once | ORAL | Status: AC
  Administered 2017-10-14: 324 mg via ORAL
  Filled 2017-10-14: qty 4

## 2017-10-14 MED ORDER — GI COCKTAIL ~~LOC~~
30.0000 mL | Freq: Once | ORAL | Status: AC
  Administered 2017-10-14: 30 mL via ORAL
  Filled 2017-10-14: qty 30

## 2017-10-14 MED ORDER — NITROGLYCERIN 0.4 MG SL SUBL
0.4000 mg | SUBLINGUAL_TABLET | SUBLINGUAL | Status: AC | PRN
  Administered 2017-10-14 (×3): 0.4 mg via SUBLINGUAL
  Filled 2017-10-14: qty 1

## 2017-10-14 NOTE — ED Provider Notes (Signed)
[facility] EMERGENCY DEPARTMENT Provider Note   CSN: [id number] Arrival date & time: 10/14/17  2006     History   Chief Complaint Chief Complaint  Patient presents with  . Chest Pain    HPI Danielle Knapp is a 62 y.o. female.  HPI  62 year old female history of hypertension presents today complaining of hypertension and substernal chest pain.  She states this began approximately 11:30 AM.  She took Aleve without relief.  She states that the pain is burning and feels somewhat like pressure.  She states it is 5 out of 10 and has increased during the day.  She can site no worsening or relieving factors.  It is not worse with deep breathing, movement, or food.  She feels like she had some nausea earlier but did not vomit.  She reports no history of fever, chills, cough, dyspnea, known coronary artery disease, DVT, or PE.  Her primary care physician is Dr. Dema Severin and he will.  She has not been evaluated by cardiology in the past  Past Medical History:  Diagnosis Date  . Depression   . Diverticulitis   . Hypertension   . Kidney stones 1/11   spontaneously passed dx'd based on blood test & history of cancer  . Plantar fasciitis of left foot   . Stenosis of cervix   . Tennis elbow    -right    Patient Active Problem List   Diagnosis Date Noted  . Depression   . Hypertension   . Polycythemia 08/11/2012    Past Surgical History:  Procedure Laterality Date  . ANAL SPHINCTEROPLASTY  2004   hemorrhoids  . CATARACT EXTRACTION, BILATERAL  2013  . DIAGNOSTIC LAPAROSCOPY  1996  . FINGER SURGERY    . HEEL SPUR SURGERY Right 2015   Dr. Durward Fortes  . SEPTOPLASTY  1970's   x2  . TONSILLECTOMY      OB History    Gravida Para Term Preterm AB Living   0 0 0 0 0 0   SAB TAB Ectopic Multiple Live Births   0 0 0 0         Home Medications    Prior to Admission medications   Medication Sig Start Date End Date Taking? Authorizing Provider  aspirin 81 MG tablet  Take 81 mg by mouth daily.    [provider]  Biotin 5000 MCG TABS Take 1 tablet by mouth daily.    [provider]  buPROPion (WELLBUTRIN XL) 300 MG 24 hr tablet Take 300 mg by mouth daily. 02/04/15   [provider]  Calcium Lactate 750 MG TABS Take 1 tablet by mouth daily.    [provider]  estradiol (ESTRACE VAGINAL) 0.1 MG/GM vaginal cream Place 1/2 gram in vagina at bedtime nightly for 2 weeks and then 1/2 gram twice weekly. 02/20/15   Nunzio Cobbs, MD  estradiol (ESTRACE) 0.5 MG tablet Take 1/2 tablet by mouth daily (0.25 mg). 03/19/17   Nunzio Cobbs, MD  fish oil-omega-3 fatty acids 1000 MG capsule Take 2,400 mg by mouth daily.    [provider]  losartan (COZAAR) 50 MG tablet Take 50 mg by mouth daily. 01/31/16   [provider]  Multiple Vitamin (MULTIVITAMIN) tablet Take 1 tablet by mouth daily.    [provider]  ondansetron (ZOFRAN ODT) 4 MG disintegrating tablet Take 1 tablet (4 mg total) by mouth every 8 (eight) hours as needed for nausea  or vomiting. 05/04/17   Harvest Dark, MD  oxyCODONE-acetaminophen (ROXICET) 5-325 MG tablet Take 1 tablet by mouth every 6 (six) hours as needed. 05/04/17   Harvest Dark, MD  progesterone (PROMETRIUM) 100 MG capsule Take 1 capsule (100 mg total) by mouth daily. 03/19/17   Nunzio Cobbs, MD  sertraline (ZOLOFT) 100 MG tablet Take 100 mg by mouth Daily. Takes 150mg  daily 07/22/12   [provider]  zolpidem (AMBIEN) 5 MG tablet Take 1 tablet by mouth at bedtime. 03/01/16   [provider]    Family History Family History  Problem Relation Age of Onset  . Osteoporosis Mother   . Heart attack Mother   . Alzheimer's disease Mother   . Diabetes Father   . Hypertension Father   . Ovarian cancer Sister     Social History Social History   Tobacco Use  . Smoking status: Never Smoker  . Smokeless tobacco: Never Used    Substance Use Topics  . Alcohol use: Yes    Alcohol/week: 1.8 oz    Types: 3 Glasses of wine per week  . Drug use: No     Allergies   Erythromycin; Nsaids; Ciprofloxacin hcl; and Flagyl [metronidazole]   Review of Systems Review of Systems  All other systems reviewed and are negative.    Physical Exam Updated Vital Signs BP (!) 141/79 (BP Location: Right Arm)   Pulse (!) 56   Temp 98 F (36.7 C) (Oral)   Resp (!) 22   LMP 09/03/2011   SpO2 100%   Physical Exam  Constitutional: She appears well-developed and well-nourished.  HENT:  Head: Normocephalic and atraumatic.  Eyes: EOM are normal. Pupils are equal, round, and reactive to light.  Neck: Normal range of motion. Neck supple.  Cardiovascular: Normal rate, regular rhythm, intact distal pulses and normal pulses.  Pulmonary/Chest: Effort normal and breath sounds normal.  Abdominal: Soft. Bowel sounds are normal.  Musculoskeletal: Normal range of motion.       Right lower leg: Normal. She exhibits no tenderness and no edema.       Left lower leg: Normal. She exhibits no tenderness and no edema.  Neurological: She is alert.  Skin: Skin is warm and dry. Capillary refill takes less than 2 seconds.  Psychiatric: She has a normal mood and affect. Her behavior is normal.  Nursing note and vitals reviewed.    ED Treatments / Results  Labs (all labs ordered are listed, but only abnormal results are displayed) Labs Reviewed  BASIC METABOLIC PANEL  CBC  I-STAT TROPONIN, ED    EKG  EKG Interpretation  Date/Time:  Tuesday October 14 2017 20:19:49 EST Ventricular Rate:  64 PR Interval:    QRS Duration: 86 QT Interval:  430 QTC Calculation: 444 R Axis:   70 Text Interpretation:  Sinus rhythm Consider left ventricular hypertrophy no old Confirmed by Pattricia Boss [phone]) on 10/14/2017 8:26:05 PM       Radiology Dg Chest 2 View  Result Date: 10/14/2017 CLINICAL DATA:  Chest pressure beginning this morning.  Headache and hypertension. Discomfort of the left neck EXAM: CHEST  2 VIEW COMPARISON:  Overlapping portions of CT abdomen 05/04/2017 FINDINGS: Mild enlargement of the cardiopericardial silhouette. Atherosclerotic calcification of the aortic arch. High no edema although there is some mild upper zone pulmonary vascular prominence. Trace blunting of the posterior costophrenic angles. Minimal thoracic spondylosis. IMPRESSION: 1. Mild cardiomegaly. Borderline pulmonary venous hypertension but no overt edema. 2.  Aortic Atherosclerosis (  ICD10-I70.0). 3. Trace blunting of the posterior costophrenic angles, trace pleural effusions not excluded. Electronically Signed   By: Van Clines M.D.   On: 10/14/2017 21:20    Procedures Procedures (including critical care time)  Medications Ordered in ED Medications - No data to display   Initial Impression / Assessment and Plan / ED Course  I have reviewed the triage vital signs and the nursing notes.  Pertinent labs & imaging results that were available during my care of the patient were reviewed by me and considered in my medical decision making (see chart for details).   62 y.o female with sscp.  Risk factors of hypertension, obesity, nsst/lvh changes ekg.ASA here with gi cocktail and nitro with some relief, but cp continues.  Repeat troponin ordered.   HEAR Score 4-plan evaluation in hospital to trend troponin and further evaluate chest pain Discussed with Dr. Myna Hidalgo and he will see for admission Final Clinical Impressions(s) / ED Diagnoses   Final diagnoses:  Chest pain, unspecified type    ED Discharge Orders    None       Pattricia Boss, MD 10/15/17 0002

## 2017-10-14 NOTE — ED Triage Notes (Signed)
Pt went to [facility] In clinic for chest pressure/heaviness. Noted to be hypertensive at clinic 190/100, does take losartan for htn (dose was doubled in December). Clinic gave clonidine 0.1mg . EMS gave 324mg  ASA and nitro x 2 with some improvement

## 2017-10-15 ENCOUNTER — Encounter (HOSPITAL_COMMUNITY): Payer: Self-pay | Admitting: Family Medicine

## 2017-10-15 ENCOUNTER — Other Ambulatory Visit: Payer: Self-pay

## 2017-10-15 DIAGNOSIS — I119 Hypertensive heart disease without heart failure: Secondary | ICD-10-CM | POA: Diagnosis not present

## 2017-10-15 DIAGNOSIS — F329 Major depressive disorder, single episode, unspecified: Secondary | ICD-10-CM | POA: Diagnosis not present

## 2017-10-15 DIAGNOSIS — I160 Hypertensive urgency: Secondary | ICD-10-CM

## 2017-10-15 DIAGNOSIS — R0602 Shortness of breath: Secondary | ICD-10-CM | POA: Diagnosis not present

## 2017-10-15 DIAGNOSIS — R0789 Other chest pain: Secondary | ICD-10-CM | POA: Diagnosis not present

## 2017-10-15 DIAGNOSIS — R079 Chest pain, unspecified: Secondary | ICD-10-CM | POA: Diagnosis not present

## 2017-10-15 DIAGNOSIS — I10 Essential (primary) hypertension: Secondary | ICD-10-CM

## 2017-10-15 LAB — HIV ANTIBODY (ROUTINE TESTING W REFLEX): HIV Screen 4th Generation wRfx: NONREACTIVE

## 2017-10-15 LAB — TROPONIN I: Troponin I: <0.03 ng/mL (ref <0.03)

## 2017-10-15 MED ORDER — ZOLPIDEM TARTRATE 5 MG PO TABS
5.0000 mg | ORAL_TABLET | Freq: Every day | ORAL | Status: DC
  Filled 2017-10-15: qty 1

## 2017-10-15 MED ORDER — SERTRALINE HCL 100 MG PO TABS
100.0000 mg | ORAL_TABLET | Freq: Two times a day (BID) | ORAL | Status: DC
  Administered 2017-10-15 – 2017-10-16 (×3): 100 mg via ORAL
  Filled 2017-10-15 (×3): qty 1

## 2017-10-15 MED ORDER — FAMOTIDINE 20 MG PO TABS
20.0000 mg | ORAL_TABLET | Freq: Two times a day (BID) | ORAL | Status: DC
  Administered 2017-10-15 – 2017-10-16 (×2): 20 mg via ORAL
  Filled 2017-10-15 (×2): qty 1

## 2017-10-15 MED ORDER — OMEGA-3-ACID ETHYL ESTERS 1 G PO CAPS
1.0000 g | ORAL_CAPSULE | Freq: Every day | ORAL | Status: DC
  Administered 2017-10-15 – 2017-10-16 (×2): 1 g via ORAL
  Filled 2017-10-15 (×2): qty 1

## 2017-10-15 MED ORDER — FAMOTIDINE IN NACL 20-0.9 MG/50ML-% IV SOLN
20.0000 mg | Freq: Two times a day (BID) | INTRAVENOUS | Status: DC
  Administered 2017-10-15 (×2): 20 mg via INTRAVENOUS
  Filled 2017-10-15 (×3): qty 50

## 2017-10-15 MED ORDER — ENOXAPARIN SODIUM 40 MG/0.4ML ~~LOC~~ SOLN: 40.0000 mg | SUBCUTANEOUS | Status: DC

## 2017-10-15 MED ORDER — BUPROPION HCL ER (XL) 300 MG PO TB24
300.0000 mg | ORAL_TABLET | Freq: Every day | ORAL | Status: DC
Start: 2017-10-15 — End: 2017-10-16
  Administered 2017-10-15 – 2017-10-16 (×2): 300 mg via ORAL
  Filled 2017-10-15: qty 2
  Filled 2017-10-15: qty 1

## 2017-10-15 MED ORDER — ASPIRIN EC 81 MG PO TBEC
81.0000 mg | DELAYED_RELEASE_TABLET | Freq: Every day | ORAL | Status: DC
  Administered 2017-10-16: 81 mg via ORAL
  Filled 2017-10-15: qty 1

## 2017-10-15 MED ORDER — ENOXAPARIN SODIUM 40 MG/0.4ML ~~LOC~~ SOLN
40.0000 mg | SUBCUTANEOUS | Status: DC
  Administered 2017-10-15 – 2017-10-16 (×2): 40 mg via SUBCUTANEOUS
  Filled 2017-10-15 (×2): qty 0.4

## 2017-10-15 MED ORDER — HYDRALAZINE HCL 20 MG/ML IJ SOLN
5.0000 mg | Freq: Once | INTRAMUSCULAR | Status: AC
  Administered 2017-10-15: 5 mg via INTRAVENOUS
  Filled 2017-10-15: qty 1

## 2017-10-15 MED ORDER — CALCIUM CARBONATE-VITAMIN D 500-200 MG-UNIT PO TABS
1.0000 | ORAL_TABLET | Freq: Every day | ORAL | Status: DC
  Administered 2017-10-16: 09:00:00 1 via ORAL
  Filled 2017-10-15 (×2): qty 1

## 2017-10-15 MED ORDER — ONDANSETRON HCL 4 MG/2ML IJ SOLN: 4.0000 mg | Freq: Four times a day (QID) | INTRAMUSCULAR | Status: DC | PRN

## 2017-10-15 MED ORDER — ACETAMINOPHEN 325 MG PO TABS
650.0000 mg | ORAL_TABLET | ORAL | Status: DC | PRN
  Administered 2017-10-15: 650 mg via ORAL
  Filled 2017-10-15: qty 2

## 2017-10-15 MED ORDER — MELATONIN 3 MG PO TABS
9.0000 mg | ORAL_TABLET | Freq: Every day | ORAL | Status: DC
  Administered 2017-10-15: 9 mg via ORAL
  Filled 2017-10-15 (×2): qty 3

## 2017-10-15 MED ORDER — ALPRAZOLAM 0.25 MG PO TABS: 0.2500 mg | ORAL_TABLET | Freq: Two times a day (BID) | ORAL | Status: DC | PRN

## 2017-10-15 MED ORDER — FAMOTIDINE IN NACL 20-0.9 MG/50ML-% IV SOLN
20.0000 mg | Freq: Two times a day (BID) | INTRAVENOUS | Status: DC
  Filled 2017-10-15: qty 50

## 2017-10-15 MED ORDER — NITROGLYCERIN 0.4 MG SL SUBL: 0.4000 mg | SUBLINGUAL_TABLET | SUBLINGUAL | Status: DC | PRN

## 2017-10-15 MED ORDER — CALCIUM CARB-CHOLECALCIFEROL 600-200 MG-UNIT PO TABS: 1.0000 | ORAL_TABLET | Freq: Every day | ORAL | Status: DC

## 2017-10-15 MED ORDER — LOSARTAN POTASSIUM 50 MG PO TABS
100.0000 mg | ORAL_TABLET | Freq: Every day | ORAL | Status: DC
  Administered 2017-10-15 – 2017-10-16 (×2): 100 mg via ORAL
  Filled 2017-10-15 (×3): qty 2

## 2017-10-15 NOTE — ED Notes (Signed)
All pt belongings in pt belonging bag.

## 2017-10-15 NOTE — ED Notes (Addendum)
Admitting at bedside, per [facility], RN echo reports pt may not go for echo today. Heart healthy tray ordered per admitting.

## 2017-10-15 NOTE — ED Notes (Signed)
Pt and family updated to bed assignment.

## 2017-10-15 NOTE — H&P (Signed)
History and Physical    Danielle Knapp MGQ:[id number] DOB: 06-10-1956 DOA: 10/14/2017  PCP: Harlan Stains, MD   Patient coming from: Home  Chief Complaint: Chest pain   HPI: Danielle Knapp is a 62 y.o. female with medical history significant for depression, hypertension, and insomnia, now presenting to the emergency department for evaluation of chest pain.  The patient reports that she was in her usual state, having an uneventful day, when she developed acute onset of pain in the central chest this afternoon while seated and reading a book.  There was no associated diaphoresis, but mild nausea and shortness of breath.  Symptoms abated spontaneously over the course of approximately 30 minutes before returning.  She has not experienced these symptoms previously.  Denies recent illness, cough, or significant dyspnea.  No lower extremity swelling or tenderness.  She was initially seen at an urgent care for evaluation of her symptoms, noted to have blood pressure 190/100, treated with clonidine and 324 mg of aspirin, as well as nitroglycerin x2  ED Course: Upon arrival to the ED, patient is found to be afebrile, saturating well on room air, and with vitals otherwise normal.  EKG features a sinus rhythm with possible LVH.  Chest x-ray is notable for mild cardiomegaly and mild pulmonary vascular congestion.  Chemistry panel is unremarkable and CBC reveals a leukocytosis to 16,400.  Troponin is undetectable x2.  Patient was treated with 324 mg of aspirin and GI cocktail without appreciable relief.  She remains hemodynamically stable, in no apparent respiratory distress, and will be observed on the telemetry unit for ongoing evaluation and management of chest pain, atypical for ACS.  Review of Systems:  All other systems reviewed and apart from HPI, are negative.  Past Medical History:  Diagnosis Date  . Depression   . Diverticulitis   . Hypertension   . Kidney stones 1/11   spontaneously passed dx'd  based on blood test & history of cancer  . Plantar fasciitis of left foot   . Stenosis of cervix   . Tennis elbow    -right    Past Surgical History:  Procedure Laterality Date  . ANAL SPHINCTEROPLASTY  2004   hemorrhoids  . CATARACT EXTRACTION, BILATERAL  2013  . DIAGNOSTIC LAPAROSCOPY  1996  . FINGER SURGERY    . HEEL SPUR SURGERY Right 2015   Dr. Durward Fortes  . SEPTOPLASTY  1970's   x2  . TONSILLECTOMY       reports that  has never smoked. she has never used smokeless tobacco. She reports that she drinks about 1.8 oz of alcohol per week. She reports that she does not use drugs.  Allergies  Allergen Reactions  . Erythromycin Diarrhea  . Nsaids Cough    Can tolerate Ibuprofen.   . Ciprofloxacin Hcl Rash  . Flagyl [Metronidazole] Rash    Family History  Problem Relation Age of Onset  . Osteoporosis Mother   . Heart attack Mother   . Alzheimer's disease Mother   . Diabetes Father   . Hypertension Father   . Ovarian cancer Sister      Prior to Admission medications   Medication Sig Start Date End Date Taking? Authorizing Provider  buPROPion (WELLBUTRIN XL) 300 MG 24 hr tablet Take 300 mg by mouth daily. 02/04/15  Yes [provider]  Calcium Carb-Cholecalciferol (CALCIUM 600 + D) 600-200 MG-UNIT TABS Take 1 tablet by mouth daily.   Yes [provider]  losartan (COZAAR) 100 MG tablet Take  100 mg by mouth daily.   Yes [provider]  Melatonin 10 MG TABS Take 10 mg by mouth at bedtime.   Yes [provider]  Omega-3 Fatty Acids (FISH OIL) 1200 MG CAPS Take 1,200 mg by mouth daily.   Yes [provider]  sertraline (ZOLOFT) 100 MG tablet Take 100 mg by mouth 2 (two) times daily.  07/22/12  Yes [provider]  valACYclovir (VALTREX) 1000 MG tablet Take 200 mg by mouth as needed (for fever blisters).   Yes [provider]  zolpidem (AMBIEN) 5 MG tablet Take 1 tablet by mouth at bedtime. 03/01/16  Yes [provider]    Physical Exam: Vitals:   10/14/17 2245 10/14/17 2300 10/14/17 2315 10/14/17 2330  BP: 123/72 125/71 119/69 121/77  Pulse: (!) 53 (!) 54 (!) 54 60  Resp: 12 14 13 17   Temp:      TempSrc:      SpO2: 98% 99% 97% 97%      Constitutional: NAD, calm  Eyes: PERTLA, lids and conjunctivae normal ENMT: Mucous membranes are moist. Posterior pharynx clear of any exudate or lesions.   Neck: normal, supple, no masses, no thyromegaly Respiratory: clear to auscultation bilaterally, no wheezing, no crackles. Normal respiratory effort.   Cardiovascular: S1 & S2 heard, regular rate and rhythm. No significant JVD. Abdomen: No distension, no tenderness, no masses palpated. Bowel sounds normal.  Musculoskeletal: no clubbing / cyanosis. No joint deformity upper and lower extremities. Normal muscle tone.  Skin: no significant rashes, lesions, ulcers. Warm, dry, well-perfused. Neurologic: CN 2-12 grossly intact. Sensation intact. Strength 5/5 in all 4 limbs.  Psychiatric:  Alert and oriented x 3. Calm, cooperative.     Labs on Admission: I have personally reviewed following labs and imaging studies  CBC: Recent Labs  Lab 10/14/17 2040  WBC 16.4*  HGB 15.6*  HCT 42.3  MCV 89.2  PLT 628   Basic Metabolic Panel: Recent Labs  Lab 10/14/17 2027  NA 142  K 4.0  CL 106  CO2 25  GLUCOSE 129*  BUN 23*  CREATININE 0.82  CALCIUM 9.3   GFR: CrCl cannot be calculated (Unknown ideal weight.). Liver Function Tests: Recent Labs  Lab 10/14/17 2027  AST 20  ALT 19  ALKPHOS 49  BILITOT 0.9  PROT 6.5  ALBUMIN 4.0   No results for input(s): LIPASE, AMYLASE in the last 168 hours. No results for input(s): AMMONIA in the last 168 hours. Coagulation Profile: Recent Labs  Lab 10/14/17 2027  INR 1.09   Cardiac Enzymes: No results for input(s): CKTOTAL, CKMB, CKMBINDEX, TROPONINI in the last 168 hours. BNP (last 3 results) No results for input(s): PROBNP in the last 8760  hours. HbA1C: No results for input(s): HGBA1C in the last 72 hours. CBG: No results for input(s): GLUCAP in the last 168 hours. Lipid Profile: No results for input(s): CHOL, HDL, LDLCALC, TRIG, CHOLHDL, LDLDIRECT in the last 72 hours. Thyroid Function Tests: No results for input(s): TSH, T4TOTAL, FREET4, T3FREE, THYROIDAB in the last 72 hours. Anemia Panel: No results for input(s): VITAMINB12, FOLATE, FERRITIN, TIBC, IRON, RETICCTPCT in the last 72 hours. Urine analysis:    Component Value Date/Time   COLORURINE YELLOW (A) 05/04/2017 1450   APPEARANCEUR CLEAR (A) 05/04/2017 1450   LABSPEC 1.023 05/04/2017 1450   PHURINE 6.0 05/04/2017 1450   GLUCOSEU NEGATIVE 05/04/2017 1450   HGBUR NEGATIVE 05/04/2017 1450   BILIRUBINUR NEGATIVE 05/04/2017 1450   BILIRUBINUR n 02/28/2016 3151  KETONESUR NEGATIVE 05/04/2017 1450   PROTEINUR NEGATIVE 05/04/2017 1450   UROBILINOGEN negative 02/28/2016 0837   NITRITE NEGATIVE 05/04/2017 1450   LEUKOCYTESUR MODERATE (A) 05/04/2017 1450   Sepsis Labs: @LABRCNTIP (procalcitonin:4,lacticidven:4) )No results found for this or any previous visit (from the past 240 hour(s)).   Radiological Exams on Admission: Dg Chest 2 View  Result Date: 10/14/2017 CLINICAL DATA:  Chest pressure beginning this morning. Headache and hypertension. Discomfort of the left neck EXAM: CHEST  2 VIEW COMPARISON:  Overlapping portions of CT abdomen 05/04/2017 FINDINGS: Mild enlargement of the cardiopericardial silhouette. Atherosclerotic calcification of the aortic arch. High no edema although there is some mild upper zone pulmonary vascular prominence. Trace blunting of the posterior costophrenic angles. Minimal thoracic spondylosis. IMPRESSION: 1. Mild cardiomegaly. Borderline pulmonary venous hypertension but no overt edema. 2.  Aortic Atherosclerosis (ICD10-I70.0). 3. Trace blunting of the posterior costophrenic angles, trace pleural effusions not excluded. Electronically Signed    By: Van Clines M.D.   On: 10/14/2017 21:20    EKG: Independently reviewed. Sinus rhythm, LVH.   Assessment/Plan   1. Chest pain - Presents with chest pain, mainly atypical symptoms, not relieved with NTG or GI cocktail  - EKG without appreciable acute ischemic features, CXR with mild congestion and cardiomegaly, no edema  - Troponin negative x2 in ED  - She was treated with ASA 324  - Low-suspicion for ACS, but has risk-factors that include HTN and FHx of premature CAD   - Given the EKG and CXR findings suggestive of possible CHF, will check echo for wall-motion and ?CHF    2. Hypertension with hypertensive urgency  - BP 190/100 initially, normalized with NTG and clonidine at urgent care  - Continue losartan    3. Depression  - Stable  - Continue Wellbutrin and Zoloft     DVT prophylaxis: Lovenox Code Status: Full  Family Communication: Discussed with patient Disposition Plan: Observe on telemetry Consults called: None Admission status: Observation   Vianne Bulls, MD [facility] Pager [phone]  If 7PM-7AM, please contact night-coverage [email or web address] Password [facility]  10/15/2017, 2:11 AM

## 2017-10-15 NOTE — Progress Notes (Signed)
Hospitalist progress note   Danielle Knapp  NTI:[id number] DOB: [birth year] DOA: 10/14/2017 PCP: Harlan Stains, MD   Specialists:   Brief Narrative:  I have reviewed the history and physical plan as per Dr. Myna Hidalgo patient was admitted overnight  61 CF depression, HTN, insomnia, reactive polycythemia admit acute central chest pain, urgent care blood pressure 190/100 Rx clonidine aspirin nitro-CBC 16,002 EKG LVH, CXR cardiomegaly congestion-no relief with nitroglycerin or GI cocktail Echo ordered secondary to vascular congestion on chest x-ray and pending Came in because opf heaviness in the chest-felt like a "elep[hant on her chest"-has some heaviness before in the past-the pressure was constant--more found in the middle of the chest-no radiation to either Jaw nor L arm.  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  Likely acute diastolic HF-EKG and Troponin are neg Will allow patient to eat-have patient await Echo-will make OP referral to Cardiology for appt and will discuss medications and diuretics and HF with her going forward in the next 24 hours as not at all volume overloaded    DVT prophylaxis: lovenoex  Code Status:    Inpatient    Family Communication:   Husband at bedside   Disposition Plan:  obs and possible inpatient if needing more itme   Consultants:    none as yet   Procedures:   Echo pending  Antimicrobials:   None    Subjective: Awake  alert pleasan tno cp No fever no co8uhg no cold no fever no chills no n/v  Objective: Vitals:   10/15/17 0609 10/15/17 0615 10/15/17 0630 10/15/17 0645  BP: 121/65 107/64 119/64 124/70  Pulse: (!) 49 61 (!) 47 (!) 44  Resp: 17 18 17 16   Temp:      TempSrc:      SpO2: 97% 95% 98% 99%   No intake or output data in the 24 hours ending 10/15/17 0731 There were no vitals filed for this visit.  Examination:  Awake alert pleasant in nad s1 s2 no m/r/g no carotid bruit no jvd Ct ab no added sound abd soft nt nd no rebound no guard No le edema  Data Reviewed: I have personally reviewed following labs and imaging studies  CBC: Recent Labs  Lab 10/14/17 2040  WBC 16.4*  HGB 15.6*  HCT 42.3  MCV 89.2  PLT 867   Basic Metabolic Panel: Recent Labs  Lab 10/14/17 2027  NA 142  K 4.0  CL 106  CO2 25  GLUCOSE 129*  BUN 23*  CREATININE 0.82  CALCIUM 9.3   GFR: CrCl cannot be calculated (Unknown ideal weight.). Liver Function Tests: Recent Labs  Lab 10/14/17 2027  AST 20  ALT 19  ALKPHOS 49  BILITOT 0.9  PROT 6.5  ALBUMIN 4.0   No results for input(s): LIPASE, AMYLASE in the last 168 hours. No results for input(s): AMMONIA in the last 168 hours.  Coagulation Profile: Recent Labs  Lab 10/14/17 2027  INR 1.09   Cardiac Enzymes: Recent Labs  Lab 10/15/17 0230  TROPONINI <0.03   CBG: No results for input(s): GLUCAP in the last 168 hours. Urine analysis:    Component Value Date/Time   COLORURINE YELLOW (A) 05/04/2017 1450   APPEARANCEUR CLEAR (A) 05/04/2017 1450   LABSPEC 1.023 05/04/2017 1450   PHURINE 6.0 05/04/2017 1450   GLUCOSEU NEGATIVE 05/04/2017 1450   HGBUR NEGATIVE 05/04/2017 1450   BILIRUBINUR NEGATIVE 05/04/2017 1450   BILIRUBINUR n 02/28/2016 0837   KETONESUR NEGATIVE 05/04/2017 1450   PROTEINUR NEGATIVE 05/04/2017 1450   UROBILINOGEN negative 02/28/2016 0837   NITRITE NEGATIVE 05/04/2017 1450   LEUKOCYTESUR MODERATE (A) 05/04/2017 1450      Radiology Studies: Reviewed images personally in health database    Scheduled Meds: . [START ON 10/16/2017] aspirin EC  81 mg Oral Daily  . buPROPion  300 mg Oral Daily  . Calcium Carb-Cholecalciferol  1 tablet Oral Daily  . enoxaparin (LOVENOX) injection  40 mg Subcutaneous Q24H  . losartan  100 mg Oral Daily  . Melatonin  10 mg Oral QHS  . omega-3 acid ethyl esters  1 g Oral Daily  . sertraline  100 mg Oral BID  . zolpidem  5 mg Oral QHS   Continuous Infusions: . famotidine (PEPCID) IV Stopped (10/15/17 0332)     LOS: 0 days    Time spent: no charge today as after midnight    Verneita Griffes, MD [facility] ([phone]   If 7PM-7AM, please contact night-coverage [email or web address] Password [employer] 10/15/2017, 7:31 AM

## 2017-10-15 NOTE — ED Notes (Addendum)
Hospital bed ordered. Lunch tray at bedside

## 2017-10-16 ENCOUNTER — Encounter: Payer: Self-pay | Admitting: Family Medicine

## 2017-10-16 ENCOUNTER — Observation Stay (HOSPITAL_BASED_OUTPATIENT_CLINIC_OR_DEPARTMENT_OTHER): Payer: Commercial Managed Care - PPO

## 2017-10-16 DIAGNOSIS — I509 Heart failure, unspecified: Secondary | ICD-10-CM

## 2017-10-16 DIAGNOSIS — R079 Chest pain, unspecified: Secondary | ICD-10-CM | POA: Diagnosis not present

## 2017-10-16 DIAGNOSIS — R0789 Other chest pain: Secondary | ICD-10-CM | POA: Diagnosis not present

## 2017-10-16 DIAGNOSIS — I119 Hypertensive heart disease without heart failure: Secondary | ICD-10-CM | POA: Diagnosis not present

## 2017-10-16 DIAGNOSIS — R0602 Shortness of breath: Secondary | ICD-10-CM | POA: Diagnosis not present

## 2017-10-16 DIAGNOSIS — I160 Hypertensive urgency: Secondary | ICD-10-CM | POA: Diagnosis not present

## 2017-10-16 LAB — ECHOCARDIOGRAM COMPLETE
Height: 66.5 in
WEIGHTICAEL: 2873.6 [oz_av]

## 2017-10-16 MED ORDER — METOPROLOL TARTRATE 25 MG PO TABS: 12.5000 mg | ORAL_TABLET | Freq: Two times a day (BID) | ORAL | 0 refills | Status: DC

## 2017-10-16 MED ORDER — ASPIRIN 81 MG PO TBEC: 81.0000 mg | DELAYED_RELEASE_TABLET | Freq: Every day | ORAL | Status: DC

## 2017-10-16 NOTE — Plan of Care (Signed)
Patient was education on the Echo procedure that is scheduled today. She verbalized having no additional questions

## 2017-10-16 NOTE — Progress Notes (Signed)
  Echocardiogram 2D Echocardiogram has been performed.  Danielle Knapp 10/16/2017, 3:01 PM

## 2017-10-16 NOTE — Progress Notes (Signed)
Reviewed discharge instructions/medications with patient. Answered her questions. Pt is stable and ready for discharge. Pt is waiting on her ride.

## 2017-10-16 NOTE — Progress Notes (Signed)
Patient ambulated length of hallway. Pt tolerated well.

## 2017-10-16 NOTE — Progress Notes (Signed)
Patient complained of feeling pressure in her chest and upper gastric area that worsened with deep breathing. She denied pain. Notified the MD and performed an EKG. Her blood pressure was elevated. Md gave me an order for IV hydralazine. Patient remained asymptomatic except the chest pressure. Will continue to monitor for patient safety.

## 2017-10-16 NOTE — Discharge Summary (Signed)
Physician Discharge Summary  Danielle Knapp RXV:[id number] DOB: 12/23/1955 DOA: 10/14/2017  PCP: Harlan Stains, MD  Admit date: 10/14/2017 Discharge date: 10/16/2017  Time spent: 35 minutes  Recommendations for Outpatient Follow-up:  1. OP follow-up with Dr. Doylene Canard Cardiology already set-up for possible Exercise stress test 2. Need sOP labs 3. New meds-ASa 81 and metoprolol 12.5 bid 4. Please consider referral to psychiatry for anxiety and job related stress  Discharge Diagnoses:  Principal Problem:   Chest pain Active Problems:   Polycythemia   Depression   Hypertension   Hypertensive urgency   Discharge Condition: improved  Diet recommendation: hh low salt  Filed Weights   10/15/17 1432 10/16/17 0437  Weight: 82.2 kg (181 lb 4.8 oz) 81.5 kg (179 lb 9.6 oz)    History of present illness:  61 CF depression, HTN, insomnia, reactive polycythemia admit acute central chest pain, urgent care blood pressure 190/100 Rx clonidine aspirin nitro-CBC 16,002 EKG LVH, CXR cardiomegaly congestion-no relief with nitroglycerin or GI cocktail Echo ordered secondary to vascular congestion on chest x-ray and pending Came in because opf heaviness in the chest-felt like a "elep[hant on her chest"-has some heaviness before in the past-the pressure was constant--more found in the middle of the chest-no radiation to either Jaw nor L arm.  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 and procedure was changed and was quite difficult Active lady who walks up and down flights of stairs at home multiple x's a day and who enjoys walking up the stairs at work [7 flights]  Has not had any effort related angina or discomfort recelty and juts had a steroid shot in her knee    Hospital Course:  She had mild CP overnight but on questioning more positional and in the  epigastrium and worsened by deep breathing I had a long chat with her and offered Cardiology input and it was eventually mutually agreed upon with a Echo showing grade 2 diastolic dysfunction and mild cardiomegaly without WM anomaly that this could be safely done as an OP with regards to stress testing I discussed with Dr. Doylene Canard and he will follow her closely --we will rx metoprolol, asa and as she was euvolemic will hold for now diuretic but consider in the future--I gave her warning signs and also wrote her a letter on d/c home for returning to work on 2/15  Procedures: Impressions on echo 10/16/17  - LVEF 60-65%, normal wall thickness, normal wall motion, grade 1   DD, indeterminate LV filling pressure, trivial MR, normal   biatrial size, dilated IVC.  Consultations:  Cardiology  Discharge Exam: Vitals:   10/16/17 0437 10/16/17 0800  BP: (!) 146/73 (!) 145/67  Pulse: (!) 55 62  Resp: 18   Temp: 97.6 F (36.4 C)   SpO2: 95% 100%    General: eomi ncat in nad Cardiovascular: s1 s2 no m/r/g Respiratory: clear no added sound, no rales no rhonchi  Discharge Instructions   Discharge Instructions    Diet - low sodium heart healthy   Complete by:  As directed    Discharge instructions   Complete by:  As directed    Follow up with Dr. Doylene Canard and note your meds and changes We will rx a beta blocker which should be HELD on the night before your procedure.  If you have crushing impending doom like chest pain-please re-present to the Hospital for a work-up, but we felt your symptoms were not entirely convincing for pain form a cardiac cause and could be managed closely as an outpatient   Increase activity slowly   Complete by:  As directed      Allergies as of 10/16/2017      Reactions   Erythromycin Diarrhea   Nsaids Cough   Can tolerate Ibuprofen.    Ciprofloxacin Hcl Rash   Flagyl [metronidazole] Rash      Medication List    TAKE these medications   aspirin 81 MG  EC tablet Take 1 tablet (81 mg total) by mouth daily. Start taking on:  10/17/2017   buPROPion 300 MG 24 hr tablet Commonly known as:  WELLBUTRIN XL Take 300 mg by mouth daily.   CALCIUM 600 + D 600-200 MG-UNIT Tabs Generic drug:  Calcium Carb-Cholecalciferol Take 1 tablet by mouth daily.   Fish Oil 1200 MG Caps Take 1,200 mg by mouth daily.   losartan 100 MG tablet Commonly known as:  COZAAR Take 100 mg by mouth daily.   Melatonin 10 MG Tabs Take 10 mg by mouth at bedtime.   metoprolol tartrate 25 MG tablet Commonly known as:  LOPRESSOR Take 0.5 tablets (12.5 mg total) by mouth 2 (two) times daily.   sertraline 100 MG tablet Commonly known as:  ZOLOFT Take 100 mg by mouth 2 (two) times daily.   valACYclovir 1000 MG tablet Commonly known as:  VALTREX Take 200 mg by mouth as needed (for fever blisters).   zolpidem 5 MG tablet Commonly known as:  AMBIEN Take 1 tablet by mouth at bedtime.      Allergies  Allergen Reactions  . Erythromycin Diarrhea  . Nsaids Cough    Can tolerate Ibuprofen.   . Ciprofloxacin Hcl Rash  . Flagyl [Metronidazole] Rash      The results of significant diagnostics from this hospitalization (including imaging, microbiology, ancillary and laboratory) are listed below for reference.    Significant Diagnostic Studies: Dg Chest 2 View  Result Date: 10/14/2017 CLINICAL DATA:  Chest pressure beginning this morning. Headache and hypertension. Discomfort of the left neck EXAM: CHEST  2 VIEW COMPARISON:  Overlapping portions of CT abdomen 05/04/2017 FINDINGS: Mild enlargement of the cardiopericardial silhouette. Atherosclerotic calcification of the aortic arch. High no edema although there is some mild upper zone pulmonary vascular prominence. Trace blunting of the posterior costophrenic angles. Minimal thoracic spondylosis. IMPRESSION: 1. Mild cardiomegaly. Borderline pulmonary venous hypertension but no overt edema. 2.  Aortic Atherosclerosis  (ICD10-I70.0). 3. Trace blunting of the posterior costophrenic angles, trace pleural effusions not excluded. Electronically Signed   By: Van Clines M.D.   On: 10/14/2017 21:20    Microbiology: No results found for this or any previous visit (from the past 240 hour(s)).   Labs: Basic Metabolic Panel: Recent Labs  Lab 10/14/17 2027  NA 142  K 4.0  CL 106  CO2 25  GLUCOSE 129*  BUN 23*  CREATININE 0.82  CALCIUM 9.3   Liver Function Tests: Recent Labs  Lab 10/14/17 2027  AST 20  ALT 19  ALKPHOS 49  BILITOT 0.9  PROT 6.5  ALBUMIN 4.0   No results for input(s): LIPASE, AMYLASE in the last 168 hours. No results for input(s): AMMONIA in the last 168 hours. CBC: Recent Labs  Lab 10/14/17 2040  WBC 16.4*  HGB 15.6*  HCT 42.3  MCV 89.2  PLT  259   Cardiac Enzymes: Recent Labs  Lab 10/15/17 0230 10/15/17 0810  TROPONINI <0.03 <0.03   BNP: BNP (last 3 results) No results for input(s): BNP in the last 8760 hours.  ProBNP (last 3 results) No results for input(s): PROBNP in the last 8760 hours.  CBG: No results for input(s): GLUCAP in the last 168 hours.     Signed:  Nita Sells MD   [facility] 10/16/2017, 4:34 PM

## 2017-10-21 ENCOUNTER — Telehealth: Payer: Self-pay | Admitting: *Deleted

## 2017-10-21 DIAGNOSIS — R0789 Other chest pain: Secondary | ICD-10-CM | POA: Diagnosis not present

## 2017-10-21 DIAGNOSIS — I10 Essential (primary) hypertension: Secondary | ICD-10-CM | POA: Diagnosis not present

## 2017-10-21 NOTE — Telephone Encounter (Signed)
Referral notes from [facility] sent to scheduling

## 2017-10-23 DIAGNOSIS — M25512 Pain in left shoulder: Secondary | ICD-10-CM | POA: Diagnosis not present

## 2017-10-23 DIAGNOSIS — G8929 Other chronic pain: Secondary | ICD-10-CM | POA: Diagnosis not present

## 2017-10-24 ENCOUNTER — Telehealth: Payer: Self-pay

## 2017-10-24 NOTE — Telephone Encounter (Signed)
ERROR

## 2017-10-26 NOTE — H&P (View-Only) (Signed)
.   Cardiology Office Note  NEW PATIENT VISIT Date:  10/27/2017   ID:  Danielle Knapp, DOB 03-10-1956, MRN [id number]  PCP:  Harlan Stains, MD  Cardiologist:  DOD  Dr. Johnsie Cancel    Chief Complaint  Patient presents with  . Chest Pain    abnormal stress test      History of Present Illness: Danielle Knapp is a 62 y.o. female who is being seen today for the evaluation of chest pain at the request of Harlan Stains, MD.   No flowsheet data found. 61CFdepression, HTN, insomnia, reactive polycythemiaadmit acute central chest pain, urgent care blood pressure 190/100 Rx clonidine aspirin nitro-CBC 16,002 EKG LVH, CXR cardiomegaly congestion-no relief with nitroglycerin or GI cocktail Echo ordered secondary to vascular congestion on chest x-ray    Chest pain located centrally like elephant on chest her troponins were neg.  + FH of CAD with her mother at same age.    Saw Dr. Doylene Canard and had ETT.  This was +with 1-2 mm significan ST changes in inf. Leads,  + to ischemia.  BP was elevated to 200/90.  She has had metoprolol added to her loasartan. .    Echo G1DD EF 60-65%  Today still with episodes of chest heaviness with exertion and some SOB.  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  She does not have SL nitro .    Past Medical History:  Diagnosis Date  . Allergic rhinitis   . Arthritis of shoulder   . Cataracts, bilateral   . Chest pressure   . Chondromalacia of patella   . Depression   . Diverticulitis   . Glaucoma    BORDERLINE IN LEFT EYE  . Heel spur   . HSV infection    TYPE 1 IN HER NOSE,LIP  . Hypertension   . Infertility, female   . Kidney stones 1/11   spontaneously passed dx'd based on blood test & history of cancer  . Mallet finger    LEFT INDEX FINGER  . Osteoarthritis, knee   . Plantar fasciitis   . Plantar fasciitis of left foot   .  Polycythemia   . Situational stress   . Stenosis of cervix   . Tennis elbow    -right    Past Surgical History:  Procedure Laterality Date  . ANAL SPHINCTEROPLASTY  2004   hemorrhoids  . CATARACT EXTRACTION, BILATERAL  2013  . DIAGNOSTIC LAPAROSCOPY  1996  . FINGER SURGERY    . HEEL SPUR SURGERY Right 2015   Dr. Durward Fortes  . SEPTOPLASTY  1970's   x2  . TONSILLECTOMY       Current Outpatient Medications  Medication Sig Dispense Refill  . aspirin EC 81 MG EC tablet Take 1 tablet (81 mg total) by mouth daily.    Marland Kitchen buPROPion (WELLBUTRIN XL) 300 MG 24 hr tablet Take 300 mg by mouth daily.  0  . Calcium Carb-Cholecalciferol (CALCIUM 600 + D) 600-200 MG-UNIT TABS Take 1 tablet by mouth daily.    Marland Kitchen losartan (COZAAR) 100 MG tablet Take 100 mg by mouth daily.    . Melatonin 10 MG TABS Take 10 mg by mouth at bedtime.    . metoprolol tartrate (LOPRESSOR) 25 MG tablet Take 0.5 tablets (12.5 mg total) by mouth 2 (two) times daily. 60 tablet  0  . Omega-3 Fatty Acids (FISH OIL) 1200 MG CAPS Take 1,200 mg by mouth daily.    . sertraline (ZOLOFT) 100 MG tablet Take 100 mg by mouth 2 (two) times daily.     . valACYclovir (VALTREX) 1000 MG tablet Take 2,000 mg by mouth 2 (two) times daily as needed (for fever blisters).     . zolpidem (AMBIEN) 5 MG tablet Take 1 tablet by mouth at bedtime.  0  . amLODipine (NORVASC) 5 MG tablet Take 1 tablet (5 mg total) by mouth daily. 30 tablet 0  . nitroGLYCERIN (NITROSTAT) 0.4 MG SL tablet Place 1 tablet (0.4 mg total) under the tongue every 5 (five) minutes as needed for chest pain. 25 tablet 0   No current facility-administered medications for this visit.     Allergies:   Erythromycin; Nsaids; Ace inhibitors; Lisinopril; Lunesta [eszopiclone]; Ciprofloxacin hcl; and Flagyl [metronidazole]    Social History:  The patient  reports that  has never smoked. she has never used smokeless tobacco. She reports that she drinks about 1.8 oz of alcohol per week. She  reports that she does not use drugs.   Family History:  The patient's family history includes Alzheimer's disease in her mother; Diabetes in her father; Heart attack in her mother; Heart failure in her father; Hypertension in her father; Osteoporosis in her mother; Ovarian cancer in her sister; Stroke in her maternal grandmother.    ROS:  General:no colds or fevers, no weight changes Skin:no rashes or ulcers HEENT:no blurred vision, no congestion CV:see HPI PUL:see HPI GI:no diarrhea constipation or melena, no indigestion GU:no hematuria, no dysuria MS:no joint pain, no claudication Neuro:no syncope, no lightheadedness Endo:no diabetes, no thyroid disease  Wt Readings from Last 3 Encounters:  10/27/17 180 lb 1.9 oz (81.7 kg)  10/16/17 179 lb 9.6 oz (81.5 kg)  05/04/17 180 lb (81.6 kg)     PHYSICAL EXAM: VS:  BP (!) 144/86   Pulse (!) 51   Ht 5\' 7"  (1.702 m)   Wt 180 lb 1.9 oz (81.7 kg)   LMP 09/03/2011   BMI 28.21 kg/m  , BMI Body mass index is 28.21 kg/m. Affect appropriate Healthy:  appears stated age 74: normal Neck supple with no adenopathy JVP normal no bruits no thyromegaly Lungs clear with no wheezing and good diaphragmatic motion Heart:  S1/S2 no murmur, no rub, gallop or click PMI normal Abdomen: benighn, BS positve, no tenderness, no AAA no bruit.  No HSM or HJR Distal pulses intact with no bruits No edema Neuro non-focal Skin warm and dry No muscular weakness     EKG:  EKG is ordered today. The ekg ordered today demonstrates SB at 51 otherwise normal, and compared to 10/14/17 no acute changes.     Recent Labs: 10/14/2017: ALT 19; BUN 23; Creatinine, Ser 0.82; Hemoglobin 15.6; Platelets 259; Potassium 4.0; Sodium 142    Lipid Panel No results found for: CHOL, TRIG, HDL, CHOLHDL, VLDL, LDLCALC, LDLDIRECT     Other studies Reviewed: Additional studies/ records that were reviewed today include: . Echo 10/16/17  Study Conclusions  - Left  ventricle: The cavity size was normal. Wall thickness was   normal. Systolic function was normal. The estimated ejection   fraction was in the range of 60% to 65%. Wall motion was normal;   there were no regional wall motion abnormalities. Doppler   parameters are consistent with abnormal left ventricular   relaxation (grade 1 diastolic dysfunction). The E/e&' ratio is  between 8-15, suggesting indeterminate LV filling pressure. - Mitral valve: Mildly thickened leaflets . There was trivial   regurgitation. - Left atrium: The atrium was normal in size. - Right ventricle: The cavity size was normal. Wall thickness was   normal. Systolic function was normal. - Right atrium: The atrium was normal in size. - Inferior vena cava: The vessel was dilated. The respirophasic   diameter changes were blunted (< 50%), consistent with elevated   central venous pressure.  Impressions:  - LVEF 60-65%, normal wall thickness, normal wall motion, grade 1   DD, indeterminate LV filling pressure, trivial MR, normal   biatrial size, dilated IVC.  ETT see above for results  ASSESSMENT AND PLAN:  1.  Chest pain, Likely due to stress but has had positive ETT in setting of HTN. Discussed options including cath, nuclear testing and cardiac CT Favor latter given her low HR She is in agreement and will schedule ASAP Given her anxiety about diagnosis. She will take her normal doses of lopressor orally and will not likely need Iv. She has tolerated SL nitro in past   2.   HTN add norvasc 5 mg daily f/u Dr Dema Severin   3.   Situational stress  Followed by PCP and on wellbutrin.  Related to law firm merger     Current medicines are reviewed with the patient today.  The patient Has no concerns regarding medicines.  The following changes have been made:  See above Labs/ tests ordered today include:see above  Disposition:   FU:  see above  Signed, Jenkins Rouge, MD  10/27/2017 11:54 AM    [facility] [place], [place], [place] [place], [place] Phone: [phone]; Fax: [phone]

## 2017-10-26 NOTE — Progress Notes (Addendum)
.   Cardiology Office Note  NEW PATIENT VISIT Date:  10/27/2017   ID:  Danielle Knapp, DOB 1956/05/04, MRN [id number]  PCP:  Harlan Stains, MD  Cardiologist:  DOD  Dr. Johnsie Cancel    Chief Complaint  Patient presents with  . Chest Pain    abnormal stress test      History of Present Illness: Danielle Knapp is a 62 y.o. female who is being seen today for the evaluation of chest pain at the request of Harlan Stains, MD.   No flowsheet data found. 61CFdepression, HTN, insomnia, reactive polycythemiaadmit acute central chest pain, urgent care blood pressure 190/100 Rx clonidine aspirin nitro-CBC 16,002 EKG LVH, CXR cardiomegaly congestion-no relief with nitroglycerin or GI cocktail Echo ordered secondary to vascular congestion on chest x-ray    Chest pain located centrally like elephant on chest her troponins were neg.  + FH of CAD with her mother at same age.    Saw Dr. Doylene Canard and had ETT.  This was +with 1-2 mm significan ST changes in inf. Leads,  + to ischemia.  BP was elevated to 200/90.  She has had metoprolol added to her loasartan. .    Echo G1DD EF 60-65%  Today still with episodes of chest heaviness with exertion and some SOB.  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  She does not have SL nitro .    Past Medical History:  Diagnosis Date  . Allergic rhinitis   . Arthritis of shoulder   . Cataracts, bilateral   . Chest pressure   . Chondromalacia of patella   . Depression   . Diverticulitis   . Glaucoma    BORDERLINE IN LEFT EYE  . Heel spur   . HSV infection    TYPE 1 IN HER NOSE,LIP  . Hypertension   . Infertility, female   . Kidney stones 1/11   spontaneously passed dx'd based on blood test & history of cancer  . Mallet finger    LEFT INDEX FINGER  . Osteoarthritis, knee   . Plantar fasciitis   . Plantar fasciitis of left foot   .  Polycythemia   . Situational stress   . Stenosis of cervix   . Tennis elbow    -right    Past Surgical History:  Procedure Laterality Date  . ANAL SPHINCTEROPLASTY  2004   hemorrhoids  . CATARACT EXTRACTION, BILATERAL  2013  . DIAGNOSTIC LAPAROSCOPY  1996  . FINGER SURGERY    . HEEL SPUR SURGERY Right 2015   Dr. Durward Fortes  . SEPTOPLASTY  1970's   x2  . TONSILLECTOMY       Current Outpatient Medications  Medication Sig Dispense Refill  . aspirin EC 81 MG EC tablet Take 1 tablet (81 mg total) by mouth daily.    Marland Kitchen buPROPion (WELLBUTRIN XL) 300 MG 24 hr tablet Take 300 mg by mouth daily.  0  . Calcium Carb-Cholecalciferol (CALCIUM 600 + D) 600-200 MG-UNIT TABS Take 1 tablet by mouth daily.    Marland Kitchen losartan (COZAAR) 100 MG tablet Take 100 mg by mouth daily.    . Melatonin 10 MG TABS Take 10 mg by mouth at bedtime.    . metoprolol tartrate (LOPRESSOR) 25 MG tablet Take 0.5 tablets (12.5 mg total) by mouth 2 (two) times daily. 60 tablet  0  . Omega-3 Fatty Acids (FISH OIL) 1200 MG CAPS Take 1,200 mg by mouth daily.    . sertraline (ZOLOFT) 100 MG tablet Take 100 mg by mouth 2 (two) times daily.     . valACYclovir (VALTREX) 1000 MG tablet Take 2,000 mg by mouth 2 (two) times daily as needed (for fever blisters).     . zolpidem (AMBIEN) 5 MG tablet Take 1 tablet by mouth at bedtime.  0  . amLODipine (NORVASC) 5 MG tablet Take 1 tablet (5 mg total) by mouth daily. 30 tablet 0  . nitroGLYCERIN (NITROSTAT) 0.4 MG SL tablet Place 1 tablet (0.4 mg total) under the tongue every 5 (five) minutes as needed for chest pain. 25 tablet 0   No current facility-administered medications for this visit.     Allergies:   Erythromycin; Nsaids; Ace inhibitors; Lisinopril; Lunesta [eszopiclone]; Ciprofloxacin hcl; and Flagyl [metronidazole]    Social History:  The patient  reports that  has never smoked. she has never used smokeless tobacco. She reports that she drinks about 1.8 oz of alcohol per week. She  reports that she does not use drugs.   Family History:  The patient's family history includes Alzheimer's disease in her mother; Diabetes in her father; Heart attack in her mother; Heart failure in her father; Hypertension in her father; Osteoporosis in her mother; Ovarian cancer in her sister; Stroke in her maternal grandmother.    ROS:  General:no colds or fevers, no weight changes Skin:no rashes or ulcers HEENT:no blurred vision, no congestion CV:see HPI PUL:see HPI GI:no diarrhea constipation or melena, no indigestion GU:no hematuria, no dysuria MS:no joint pain, no claudication Neuro:no syncope, no lightheadedness Endo:no diabetes, no thyroid disease  Wt Readings from Last 3 Encounters:  10/27/17 180 lb 1.9 oz (81.7 kg)  10/16/17 179 lb 9.6 oz (81.5 kg)  05/04/17 180 lb (81.6 kg)     PHYSICAL EXAM: VS:  BP (!) 144/86   Pulse (!) 51   Ht 5\' 7"  (1.702 m)   Wt 180 lb 1.9 oz (81.7 kg)   LMP 09/03/2011   BMI 28.21 kg/m  , BMI Body mass index is 28.21 kg/m. Affect appropriate Healthy:  appears stated age 65: normal Neck supple with no adenopathy JVP normal no bruits no thyromegaly Lungs clear with no wheezing and good diaphragmatic motion Heart:  S1/S2 no murmur, no rub, gallop or click PMI normal Abdomen: benighn, BS positve, no tenderness, no AAA no bruit.  No HSM or HJR Distal pulses intact with no bruits No edema Neuro non-focal Skin warm and dry No muscular weakness     EKG:  EKG is ordered today. The ekg ordered today demonstrates SB at 51 otherwise normal, and compared to 10/14/17 no acute changes.     Recent Labs: 10/14/2017: ALT 19; BUN 23; Creatinine, Ser 0.82; Hemoglobin 15.6; Platelets 259; Potassium 4.0; Sodium 142    Lipid Panel No results found for: CHOL, TRIG, HDL, CHOLHDL, VLDL, LDLCALC, LDLDIRECT     Other studies Reviewed: Additional studies/ records that were reviewed today include: . Echo 10/16/17  Study Conclusions  - Left  ventricle: The cavity size was normal. Wall thickness was   normal. Systolic function was normal. The estimated ejection   fraction was in the range of 60% to 65%. Wall motion was normal;   there were no regional wall motion abnormalities. Doppler   parameters are consistent with abnormal left ventricular   relaxation (grade 1 diastolic dysfunction). The E/e&' ratio is  between 8-15, suggesting indeterminate LV filling pressure. - Mitral valve: Mildly thickened leaflets . There was trivial   regurgitation. - Left atrium: The atrium was normal in size. - Right ventricle: The cavity size was normal. Wall thickness was   normal. Systolic function was normal. - Right atrium: The atrium was normal in size. - Inferior vena cava: The vessel was dilated. The respirophasic   diameter changes were blunted (< 50%), consistent with elevated   central venous pressure.  Impressions:  - LVEF 60-65%, normal wall thickness, normal wall motion, grade 1   DD, indeterminate LV filling pressure, trivial MR, normal   biatrial size, dilated IVC.  ETT see above for results  ASSESSMENT AND PLAN:  1.  Chest pain, Likely due to stress but has had positive ETT in setting of HTN. Discussed options including cath, nuclear testing and cardiac CT Favor latter given her low HR She is in agreement and will schedule ASAP Given her anxiety about diagnosis. She will take her normal doses of lopressor orally and will not likely need Iv. She has tolerated SL nitro in past   2.   HTN add norvasc 5 mg daily f/u Dr Dema Severin   3.   Situational stress  Followed by PCP and on wellbutrin.  Related to law firm merger     Current medicines are reviewed with the patient today.  The patient Has no concerns regarding medicines.  The following changes have been made:  See above Labs/ tests ordered today include:see above  Disposition:   FU:  see above  Signed, Jenkins Rouge, MD  10/27/2017 11:54 AM    [facility], [place], [place], [place] Phone: [phone]; Fax: [phone]

## 2017-10-27 ENCOUNTER — Ambulatory Visit: Payer: Commercial Managed Care - PPO | Admitting: Cardiovascular Disease

## 2017-10-27 ENCOUNTER — Encounter: Payer: Self-pay | Admitting: Cardiology

## 2017-10-27 VITALS — BP 144/86 | HR 51 | Ht 67.0 in | Wt 180.1 lb

## 2017-10-27 DIAGNOSIS — R079 Chest pain, unspecified: Secondary | ICD-10-CM | POA: Diagnosis not present

## 2017-10-27 DIAGNOSIS — I10 Essential (primary) hypertension: Secondary | ICD-10-CM

## 2017-10-27 DIAGNOSIS — F439 Reaction to severe stress, unspecified: Secondary | ICD-10-CM | POA: Diagnosis not present

## 2017-10-27 DIAGNOSIS — R9439 Abnormal result of other cardiovascular function study: Secondary | ICD-10-CM

## 2017-10-27 MED ORDER — AMLODIPINE BESYLATE 5 MG PO TABS: 5.0000 mg | ORAL_TABLET | Freq: Every day | ORAL | 0 refills | Status: DC

## 2017-10-27 MED ORDER — NITROGLYCERIN 0.4 MG SL SUBL: 0.4000 mg | SUBLINGUAL_TABLET | SUBLINGUAL | 0 refills | Status: DC | PRN

## 2017-10-27 NOTE — Patient Instructions (Addendum)
Medication Instructions:  Your physician has recommended you make the following change in your medication:   START: amlodipine 5 mg once a day  If you need a refill on your cardiac medications, please contact your pharmacy first.  Labwork: None ordered   Testing/Procedures: Your physician has requested that you have cardiac CT this week. Cardiac computed tomography (CT) is a painless test that uses an x-ray machine to take clear, detailed pictures of your heart. For further information please visit [email or web address]. Please follow instruction sheet as given.  Follow-Up: Your physician wants you to schedule a follow-up with Dr. Johnsie Cancel or Cecilie Kicks, NP after you have your Cardiac CT.   Any Other Special Instructions Will Be Listed Below (If Applicable).   Thank you for choosing [facility]     If you need a refill on your cardiac medications before your next appointment, please call your pharmacy.

## 2017-10-27 NOTE — Addendum Note (Signed)
Addended by: Teressa Senter on: 10/27/2017 11:46 AM   Modules accepted: Orders

## 2017-10-27 NOTE — Addendum Note (Signed)
Addended by: Josue Hector on: 10/27/2017 11:54 AM   Modules accepted: Level of Service

## 2017-10-27 NOTE — Progress Notes (Signed)
Cardiac CT instructions.  °

## 2017-10-29 ENCOUNTER — Telehealth: Payer: Self-pay | Admitting: Cardiovascular Disease

## 2017-10-29 DIAGNOSIS — R079 Chest pain, unspecified: Secondary | ICD-10-CM

## 2017-10-29 NOTE — Telephone Encounter (Signed)
Pt c/o of Chest Pain: STAT if CP now or developed within 24 hours  1. Are you having CP right now? Yes  2. Are you experiencing any other symptoms (ex. SOB, nausea, vomiting, sweating)? No a Tallent bit of SOB  3. How long have you been experiencing CP? About 10 mins ago  4. Is your CP continuous or coming and going? continuous   5. Have you taken Nitroglycerin? Two  ?

## 2017-10-29 NOTE — Telephone Encounter (Signed)
Pt called with C/O chest pain pt states that is not pressure is just pain in the meddle of her chest. Pt denies other symptoms. The constant  pain started 30 minutes ago BP was 169/80 and 179/93. Pt's cp contained at score of "5". Pt took then took the 2nd NTG SL BP was 135/ 81. Pt still had chest pain score of "5". Pt denies having stress now. Pt was told  that NTG would decrease her BP.  While she was talking with nurse Pt took the 3rd dose of NTG SL. She states that her BP is better 116/75 and her chest pain is a lot better. Pt states that she will wait and see how she feels. She will call back  if needed. Pt was made aware that her BP will get too low if she takes too many NTG. Pt verbalized understanding

## 2017-10-29 NOTE — Telephone Encounter (Signed)
If recurrent chest pain to go to ER she had + stress test.  I have sent to Dr. Johnsie Cancel but pt most likely needs cardiac cath.

## 2017-10-30 ENCOUNTER — Other Ambulatory Visit (HOSPITAL_COMMUNITY): Payer: Self-pay | Admitting: Cardiovascular Disease

## 2017-10-30 DIAGNOSIS — Z1231 Encounter for screening mammogram for malignant neoplasm of breast: Secondary | ICD-10-CM | POA: Diagnosis not present

## 2017-10-30 DIAGNOSIS — I209 Angina pectoris, unspecified: Secondary | ICD-10-CM

## 2017-10-30 NOTE — Telephone Encounter (Signed)
Called patient about heart cath procedure. Patient stated she is feeling fine today. Informed patient Patient is fine with having heart cath tomorrow.   Unable to get patient in for heart cath tomorrow. Scheduled patient for next available. Per Dr. Johnsie Cancel, if patient starts to have active chest pain she should go to the ED.   Called patient with time and date of heart cath. Went over instructions. A copy of instructions will be at front desk for patient to pick up tomorrow when she gets her lab work. Instructions were also sent to patient's MyChart. Patient verbalized understanding and will call with any questions.

## 2017-10-30 NOTE — Telephone Encounter (Signed)
Left message for patient to call back  

## 2017-10-30 NOTE — Telephone Encounter (Signed)
Cancel cardiac CT and set her up for cath tomorrow get lab work today

## 2017-10-31 ENCOUNTER — Other Ambulatory Visit: Payer: Commercial Managed Care - PPO | Admitting: *Deleted

## 2017-10-31 DIAGNOSIS — R079 Chest pain, unspecified: Secondary | ICD-10-CM

## 2017-10-31 NOTE — Telephone Encounter (Signed)
I am unable to find this patient's coronary CT report in Epic and she has a cath Monday morning.

## 2017-11-01 LAB — CBC WITH DIFFERENTIAL/PLATELET
BASOS ABS: 0.1 10*3/uL (ref 0.0–0.2)
Basos: 1 %
EOS (ABSOLUTE): 0.3 10*3/uL (ref 0.0–0.4)
Eos: 3 %
HEMATOCRIT: 41.9 % (ref 34.0–46.6)
HEMOGLOBIN: 15.6 g/dL (ref 11.1–15.9)
Immature Grans (Abs): 0 10*3/uL (ref 0.0–0.1)
Immature Granulocytes: 0 %
LYMPHS ABS: 1.8 10*3/uL (ref 0.7–3.1)
LYMPHS: 22 %
MCH: 33.3 pg — AB (ref 26.6–33.0)
MCHC: 37.2 g/dL — AB (ref 31.5–35.7)
MCV: 90 fL (ref 79–97)
MONOCYTES: 10 %
Monocytes Absolute: 0.8 10*3/uL (ref 0.1–0.9)
NEUTROS ABS: 5.4 10*3/uL (ref 1.4–7.0)
Neutrophils: 64 %
Platelets: 263 10*3/uL (ref 150–379)
RBC: 4.68 x10E6/uL (ref 3.77–5.28)
RDW: 12.6 % (ref 12.3–15.4)
WBC: 8.5 10*3/uL (ref 3.4–10.8)

## 2017-11-01 LAB — BASIC METABOLIC PANEL
BUN / CREAT RATIO: 23 (ref 12–28)
BUN: 18 mg/dL (ref 8–27)
CO2: 26 mmol/L (ref 20–29)
CREATININE: 0.78 mg/dL (ref 0.57–1.00)
Calcium: 9.1 mg/dL (ref 8.7–10.3)
Chloride: 107 mmol/L — ABNORMAL HIGH (ref 96–106)
GFR, EST AFRICAN AMERICAN: 95 mL/min/{1.73_m2} (ref >59)
GFR, EST NON AFRICAN AMERICAN: 82 mL/min/{1.73_m2} (ref >59)
Glucose: 94 mg/dL (ref 65–99)
POTASSIUM: 4.7 mmol/L (ref 3.5–5.2)
SODIUM: 147 mmol/L — AB (ref 134–144)

## 2017-11-01 LAB — PROTIME-INR
INR: 1 (ref 0.8–1.2)
Prothrombin Time: 10.6 s (ref 9.1–12.0)

## 2017-11-03 ENCOUNTER — Encounter (HOSPITAL_COMMUNITY): Payer: Self-pay | Admitting: Interventional Cardiology

## 2017-11-03 ENCOUNTER — Encounter (HOSPITAL_COMMUNITY)
Admission: RE | Disposition: A | Discharge status: Home / Self Care | Payer: Self-pay | Source: Ambulatory Visit | Attending: Interventional Cardiology

## 2017-11-03 ENCOUNTER — Ambulatory Visit (HOSPITAL_COMMUNITY)
Admission: RE | Admit: 2017-11-03 | Discharge: 2017-11-03 | Disposition: A | Discharge status: Home / Self Care | Payer: Commercial Managed Care - PPO | Source: Ambulatory Visit | Attending: Interventional Cardiology | Admitting: Interventional Cardiology

## 2017-11-03 DIAGNOSIS — Z79899 Other long term (current) drug therapy: Secondary | ICD-10-CM | POA: Diagnosis not present

## 2017-11-03 DIAGNOSIS — I25119 Atherosclerotic heart disease of native coronary artery with unspecified angina pectoris: Secondary | ICD-10-CM | POA: Insufficient documentation

## 2017-11-03 DIAGNOSIS — I209 Angina pectoris, unspecified: Secondary | ICD-10-CM

## 2017-11-03 DIAGNOSIS — Z886 Allergy status to analgesic agent status: Secondary | ICD-10-CM | POA: Diagnosis not present

## 2017-11-03 DIAGNOSIS — Z888 Allergy status to other drugs, medicaments and biological substances status: Secondary | ICD-10-CM | POA: Insufficient documentation

## 2017-11-03 DIAGNOSIS — F418 Other specified anxiety disorders: Secondary | ICD-10-CM | POA: Diagnosis not present

## 2017-11-03 DIAGNOSIS — I10 Essential (primary) hypertension: Secondary | ICD-10-CM | POA: Diagnosis not present

## 2017-11-03 DIAGNOSIS — Z7982 Long term (current) use of aspirin: Secondary | ICD-10-CM | POA: Diagnosis not present

## 2017-11-03 DIAGNOSIS — Z881 Allergy status to other antibiotic agents status: Secondary | ICD-10-CM | POA: Diagnosis not present

## 2017-11-03 HISTORY — PX: LEFT HEART CATH AND CORONARY ANGIOGRAPHY: CATH118249

## 2017-11-03 SURGERY — LEFT HEART CATH AND CORONARY ANGIOGRAPHY
Anesthesia: LOCAL

## 2017-11-03 MED ORDER — SODIUM CHLORIDE 0.9 % WEIGHT BASED INFUSION
3.0000 mL/kg/h | INTRAVENOUS | Status: AC
  Administered 2017-11-03: 3 mL/kg/h via INTRAVENOUS

## 2017-11-03 MED ORDER — HEPARIN SODIUM (PORCINE) 1000 UNIT/ML IJ SOLN
INTRAMUSCULAR | Status: AC
  Filled 2017-11-03: qty 1

## 2017-11-03 MED ORDER — VERAPAMIL HCL 2.5 MG/ML IV SOLN
INTRAVENOUS | Status: DC | PRN
  Administered 2017-11-03: 08:00:00 via INTRA_ARTERIAL

## 2017-11-03 MED ORDER — SODIUM CHLORIDE 0.9% FLUSH: 3.0000 mL | Freq: Two times a day (BID) | INTRAVENOUS | Status: DC

## 2017-11-03 MED ORDER — VERAPAMIL HCL 2.5 MG/ML IV SOLN
INTRAVENOUS | Status: AC
  Filled 2017-11-03: qty 2

## 2017-11-03 MED ORDER — FENTANYL CITRATE (PF) 100 MCG/2ML IJ SOLN
INTRAMUSCULAR | Status: DC | PRN
  Administered 2017-11-03 (×2): 25 ug via INTRAVENOUS

## 2017-11-03 MED ORDER — ASPIRIN 81 MG PO CHEW: 81.0000 mg | CHEWABLE_TABLET | ORAL | Status: DC

## 2017-11-03 MED ORDER — SODIUM CHLORIDE 0.9% FLUSH: 3.0000 mL | INTRAVENOUS | Status: DC | PRN

## 2017-11-03 MED ORDER — LIDOCAINE HCL (PF) 1 % IJ SOLN
INTRAMUSCULAR | Status: DC | PRN
  Administered 2017-11-03: 2 mL

## 2017-11-03 MED ORDER — SODIUM CHLORIDE 0.9 % IV SOLN: 250.0000 mL | INTRAVENOUS | Status: DC | PRN

## 2017-11-03 MED ORDER — MIDAZOLAM HCL 2 MG/2ML IJ SOLN
INTRAMUSCULAR | Status: AC
  Filled 2017-11-03: qty 2

## 2017-11-03 MED ORDER — HEPARIN (PORCINE) IN NACL 2-0.9 UNIT/ML-% IJ SOLN
INTRAMUSCULAR | Status: AC | PRN
  Administered 2017-11-03 (×2): 500 mL via INTRA_ARTERIAL

## 2017-11-03 MED ORDER — LIDOCAINE HCL (PF) 1 % IJ SOLN
INTRAMUSCULAR | Status: AC
  Filled 2017-11-03: qty 30

## 2017-11-03 MED ORDER — MIDAZOLAM HCL 2 MG/2ML IJ SOLN
INTRAMUSCULAR | Status: DC | PRN
  Administered 2017-11-03: 2 mg via INTRAVENOUS
  Administered 2017-11-03: 1 mg via INTRAVENOUS

## 2017-11-03 MED ORDER — SODIUM CHLORIDE 0.9 % IV SOLN: INTRAVENOUS | Status: AC

## 2017-11-03 MED ORDER — HEPARIN SODIUM (PORCINE) 1000 UNIT/ML IJ SOLN
INTRAMUSCULAR | Status: DC | PRN
  Administered 2017-11-03: 4000 [IU] via INTRAVENOUS

## 2017-11-03 MED ORDER — IOPAMIDOL (ISOVUE-370) INJECTION 76%
INTRAVENOUS | Status: DC | PRN
  Administered 2017-11-03: 40 mL via INTRA_ARTERIAL

## 2017-11-03 MED ORDER — SODIUM CHLORIDE 0.9 % WEIGHT BASED INFUSION: 1.0000 mL/kg/h | INTRAVENOUS | Status: DC

## 2017-11-03 MED ORDER — HEPARIN (PORCINE) IN NACL 2-0.9 UNIT/ML-% IJ SOLN
INTRAMUSCULAR | Status: AC
  Filled 2017-11-03: qty 1000

## 2017-11-03 MED ORDER — FENTANYL CITRATE (PF) 100 MCG/2ML IJ SOLN
INTRAMUSCULAR | Status: AC
  Filled 2017-11-03: qty 2

## 2017-11-03 SURGICAL SUPPLY — 11 items
CATH IMPULSE 5F ANG/FL3.5 (CATHETERS) ×1 IMPLANT
COVER PRB 48X5XTLSCP FOLD TPE (BAG) IMPLANT
COVER PROBE 5X48 (BAG) ×2
DEVICE RAD COMP TR BAND LRG (VASCULAR PRODUCTS) ×1 IMPLANT
GLIDESHEATH SLEND SS 6F .021 (SHEATH) ×1 IMPLANT
GUIDEWIRE INQWIRE 1.5J.035X260 (WIRE) IMPLANT
INQWIRE 1.5J .035X260CM (WIRE) ×2
KIT HEART LEFT (KITS) ×2 IMPLANT
PACK CARDIAC CATHETERIZATION (CUSTOM PROCEDURE TRAY) ×2 IMPLANT
TRANSDUCER W/STOPCOCK (MISCELLANEOUS) ×2 IMPLANT
TUBING CIL FLEX 10 FLL-RA (TUBING) ×2 IMPLANT

## 2017-11-03 NOTE — Discharge Instructions (Signed)

## 2017-11-03 NOTE — Interval H&P Note (Signed)
History and Physical Interval Note:  11/03/2017 7:32 AM  Danielle Knapp  has presented today for surgery, with the diagnosis of chest pain  The various methods of treatment have been discussed with the patient and family. After consideration of risks, benefits and other options for treatment, the patient has consented to  Procedure(s): LEFT HEART CATH AND CORONARY ANGIOGRAPHY (N/A) as a surgical intervention .  The patient's history has been reviewed, patient examined, no change in status, stable for surgery.  I have reviewed the patient's chart and labs.  Questions were answered to the patient's satisfaction.     Cath Lab Visit (complete for each Cath Lab visit)  Clinical Evaluation Leading to the Procedure:   ACS: No.  Non-ACS:    Anginal Classification: CCS III  Anti-ischemic medical therapy: Minimal Therapy (1 class of medications)  Non-Invasive Test Results: Intermediate-risk stress test findings: cardiac mortality 1-3%/year  Prior CABG: No previous CABG   Danielle Knapp

## 2017-11-06 NOTE — Progress Notes (Signed)
Cardiology Office Note   Date:  11/07/2017   ID:  Danielle Knapp, DOB 10/01/1955, MRN [id number]  PCP:  Harlan Stains, MD  Cardiologist:   Jenkins Rouge, MD   No chief complaint on file.     History of Present Illness: Danielle Knapp is a 62 y.o. female who presents for post cath f/u. Originally seen by PA 10/27/17 Admitted to hospital with HTN urgency and SSCP.  R/O no acute ECG changes and d/c for outpatient f/u Had positive ETT with Dr Doylene Canard and set up for diagnostic heart cath. This was  Done by Dr Irish Lack 11/03/17 with no significant CAD and normal LV function Echo done 10/16/17 also normal with no significant valve disease and EF 60-65%  Relieved that she had no CAD. Some bruising in arms  No chest pain since BP better     Past Medical History:  Diagnosis Date  . Allergic rhinitis   . Arthritis of shoulder   . Cataracts, bilateral   . Chest pressure   . Chondromalacia of patella   . Depression   . Diverticulitis   . Glaucoma    BORDERLINE IN LEFT EYE  . Heel spur   . HSV infection    TYPE 1 IN HER NOSE,LIP  . Hypertension   . Infertility, female   . Kidney stones 1/11   spontaneously passed dx'd based on blood test & history of cancer  . Mallet finger    LEFT INDEX FINGER  . Osteoarthritis, knee   . Plantar fasciitis   . Plantar fasciitis of left foot   . Polycythemia   . Situational stress   . Stenosis of cervix   . Tennis elbow    -right    Past Surgical History:  Procedure Laterality Date  . ANAL SPHINCTEROPLASTY  2004   hemorrhoids  . CATARACT EXTRACTION, BILATERAL  2013  . DIAGNOSTIC LAPAROSCOPY  1996  . FINGER SURGERY    . HEEL SPUR SURGERY Right 2015   Dr. Durward Fortes  . LEFT HEART CATH AND CORONARY ANGIOGRAPHY N/A 11/03/2017   Procedure: LEFT HEART CATH AND CORONARY ANGIOGRAPHY;  Surgeon: Jettie Booze, MD;  Location: [facility];  Service: Cardiovascular;  Laterality: N/A;  . SEPTOPLASTY  1970's   x2  . TONSILLECTOMY        Current Outpatient Medications  Medication Sig Dispense Refill  . amLODipine (NORVASC) 5 MG tablet Take 1 tablet (5 mg total) by mouth daily. 30 tablet 0  . aspirin EC 81 MG EC tablet Take 1 tablet (81 mg total) by mouth daily.    Marland Kitchen buPROPion (WELLBUTRIN XL) 300 MG 24 hr tablet Take 300 mg by mouth daily.  0  . Calcium Carb-Cholecalciferol (CALCIUM 600 + D) 600-200 MG-UNIT TABS Take 1 tablet by mouth daily.    Marland Kitchen losartan (COZAAR) 100 MG tablet Take 100 mg by mouth daily.    . Melatonin 10 MG TABS Take 10 mg by mouth at bedtime.    . metoprolol tartrate (LOPRESSOR) 25 MG tablet Take 0.5 tablets (12.5 mg total) by mouth 2 (two) times daily. 60 tablet 0  . nitroGLYCERIN (NITROSTAT) 0.4 MG SL tablet Place 1 tablet (0.4 mg total) under the tongue every 5 (five) minutes as needed for chest pain. 25 tablet 0  . Omega-3 Fatty Acids (FISH OIL) 1200 MG CAPS Take 1,200 mg by mouth daily.    . sertraline (ZOLOFT) 100 MG tablet Take 100 mg by mouth 2 (two) times daily.     Marland Kitchen  valACYclovir (VALTREX) 1000 MG tablet Take 2,000 mg by mouth 2 (two) times daily as needed (for fever blisters).     . zolpidem (AMBIEN) 5 MG tablet Take 1 tablet by mouth at bedtime.  0   No current facility-administered medications for this visit.     Allergies:   Ciprofloxacin hcl; Erythromycin; Flagyl [metronidazole]; Nsaids; Ace inhibitors; Lisinopril; and Lunesta [eszopiclone]    Social History:  The patient  reports that  has never smoked. she has never used smokeless tobacco. She reports that she drinks about 1.8 oz of alcohol per week. She reports that she does not use drugs.   Family History:  The patient's family history includes Alzheimer's disease in her mother; Diabetes in her father; Heart attack in her mother; Heart failure in her father; Hypertension in her father; Osteoporosis in her mother; Ovarian cancer in her sister; Stroke in her maternal grandmother.    ROS:  Please see the history of present illness.    Otherwise, review of systems are positive for none.   All other systems are reviewed and negative.    PHYSICAL EXAM: VS:  BP 130/86   Pulse 60   Ht 5\' 7"  (1.702 m)   Wt 182 lb 8 oz (82.8 kg)   LMP 09/03/2011   BMI 28.58 kg/m  , BMI Body mass index is 28.58 kg/m. Affect appropriate Healthy:  appears stated age 56: normal Neck supple with no adenopathy JVP normal no bruits no thyromegaly Lungs clear with no wheezing and good diaphragmatic motion Heart:  S1/S2 no murmur, no rub, gallop or click PMI normal Abdomen: benighn, BS positve, no tenderness, no AAA no bruit.  No HSM or HJR Distal pulses intact with no bruits No edema Neuro non-focal Skin warm and dry ecchymosis over right radial and left forearm  No muscular weakness    EKG:  NSR normal ECG 10/27/17   Recent Labs: 10/14/2017: ALT 19 10/31/2017: BUN 18; Creatinine, Ser 0.78; Hemoglobin 15.6; Platelets 263; Potassium 4.7; Sodium 147    Lipid Panel No results found for: CHOL, TRIG, HDL, CHOLHDL, VLDL, LDLCALC, LDLDIRECT    Wt Readings from Last 3 Encounters:  11/07/17 182 lb 8 oz (82.8 kg)  11/03/17 180 lb (81.6 kg)  10/27/17 180 lb 1.9 oz (81.7 kg)      Other studies Reviewed: Additional studies/ records that were reviewed today include: notes from [place] and cath lab [place] .Echo 10/06/17    ASSESSMENT AND PLAN:  1.  Chest pain:  Non cardiac normal cath f/u primary 2. HTN:  Well controlled.  Continue current medications and low sodium Dash type diet.   3. Anxiety: stress playing a role in her labile BP and chest pain f/u primary consider SSRI   Current medicines are reviewed at length with the patient today.  The patient does not have concerns regarding medicines.  The following changes have been made:  no change  Labs/ tests ordered today include: None  No orders of the defined types were placed in this encounter.    Disposition:   FU with cardiology PRN      Signed, Jenkins Rouge, MD   11/07/2017 11:34 AM    [facility], [place], [place]  [address] Phone: [phone]; Fax: [phone]

## 2017-11-07 ENCOUNTER — Encounter: Payer: Self-pay | Admitting: Cardiovascular Disease

## 2017-11-07 ENCOUNTER — Ambulatory Visit: Payer: Commercial Managed Care - PPO | Admitting: Cardiovascular Disease

## 2017-11-07 VITALS — BP 130/86 | HR 60 | Ht 67.0 in | Wt 182.5 lb

## 2017-11-07 DIAGNOSIS — R079 Chest pain, unspecified: Secondary | ICD-10-CM

## 2017-11-07 DIAGNOSIS — F439 Reaction to severe stress, unspecified: Secondary | ICD-10-CM | POA: Diagnosis not present

## 2017-11-07 DIAGNOSIS — I10 Essential (primary) hypertension: Secondary | ICD-10-CM | POA: Diagnosis not present

## 2017-11-07 NOTE — Patient Instructions (Addendum)
Medication Instructions:  Your physician recommends that you continue on your current medications as directed. Please refer to the Current Medication list given to you today.  Labwork: NONE  Testing/Procedures: NONE  Follow-Up: Your physician wants you to follow-up as needed with  Dr. Nishan.    If you need a refill on your cardiac medications before your next appointment, please call your pharmacy.    

## 2017-11-11 ENCOUNTER — Ambulatory Visit (HOSPITAL_COMMUNITY): Payer: Commercial Managed Care - PPO

## 2017-11-16 ENCOUNTER — Other Ambulatory Visit: Payer: Self-pay | Admitting: Cardiology

## 2017-11-17 NOTE — Telephone Encounter (Signed)
REFILL 

## 2017-12-01 ENCOUNTER — Encounter: Payer: Self-pay | Admitting: Internal Medicine

## 2017-12-03 ENCOUNTER — Encounter: Payer: Self-pay | Admitting: Internal Medicine

## 2017-12-04 ENCOUNTER — Ambulatory Visit: Payer: Commercial Managed Care - PPO | Admitting: Cardiovascular Disease

## 2017-12-08 DIAGNOSIS — I10 Essential (primary) hypertension: Secondary | ICD-10-CM | POA: Diagnosis not present

## 2018-01-03 ENCOUNTER — Other Ambulatory Visit: Payer: Self-pay

## 2018-01-03 ENCOUNTER — Encounter: Payer: Self-pay | Admitting: Emergency Medicine

## 2018-01-03 ENCOUNTER — Emergency Department
Admission: EM | Admit: 2018-01-03 | Discharge: 2018-01-04 | Disposition: A | Discharge status: Home / Self Care | Payer: Commercial Managed Care - PPO | Attending: Emergency Medicine | Admitting: Emergency Medicine

## 2018-01-03 DIAGNOSIS — R42 Dizziness and giddiness: Secondary | ICD-10-CM | POA: Diagnosis not present

## 2018-01-03 DIAGNOSIS — I10 Essential (primary) hypertension: Secondary | ICD-10-CM | POA: Diagnosis not present

## 2018-01-03 DIAGNOSIS — R2689 Other abnormalities of gait and mobility: Secondary | ICD-10-CM

## 2018-01-03 DIAGNOSIS — Z79899 Other long term (current) drug therapy: Secondary | ICD-10-CM | POA: Diagnosis not present

## 2018-01-03 DIAGNOSIS — R2681 Unsteadiness on feet: Secondary | ICD-10-CM | POA: Insufficient documentation

## 2018-01-03 LAB — CBC
HEMATOCRIT: 44.4 % (ref 35.0–47.0)
Hemoglobin: 15.6 g/dL (ref 12.0–16.0)
MCH: 32.2 pg (ref 26.0–34.0)
MCHC: 35.2 g/dL (ref 32.0–36.0)
MCV: 91.7 fL (ref 80.0–100.0)
PLATELETS: 271 10*3/uL (ref 150–440)
RBC: 4.84 MIL/uL (ref 3.80–5.20)
RDW: 13.2 % (ref 11.5–14.5)
WBC: 8 10*3/uL (ref 3.6–11.0)

## 2018-01-03 LAB — COMPREHENSIVE METABOLIC PANEL
ALK PHOS: 50 U/L (ref 38–126)
ALT: 19 U/L (ref 14–54)
AST: 22 U/L (ref 15–41)
Albumin: 4.4 g/dL (ref 3.5–5.0)
Anion gap: 6 (ref 5–15)
BILIRUBIN TOTAL: 0.8 mg/dL (ref 0.3–1.2)
BUN: 16 mg/dL (ref 6–20)
CALCIUM: 9.2 mg/dL (ref 8.9–10.3)
CO2: 28 mmol/L (ref 22–32)
CREATININE: 0.81 mg/dL (ref 0.44–1.00)
Chloride: 106 mmol/L (ref 101–111)
GFR calc Af Amer: >60 mL/min (ref >60)
Glucose, Bld: 122 mg/dL — ABNORMAL HIGH (ref 65–99)
POTASSIUM: 4.3 mmol/L (ref 3.5–5.1)
Sodium: 140 mmol/L (ref 135–145)
TOTAL PROTEIN: 7 g/dL (ref 6.5–8.1)

## 2018-01-03 LAB — TROPONIN I: Troponin I: <0.03 ng/mL (ref <0.03)

## 2018-01-03 MED ORDER — ONDANSETRON HCL 4 MG/2ML IJ SOLN
4.0000 mg | Freq: Once | INTRAMUSCULAR | Status: AC
  Administered 2018-01-03: 4 mg via INTRAVENOUS
  Filled 2018-01-03: qty 2

## 2018-01-03 MED ORDER — MECLIZINE HCL 25 MG PO TABS
12.5000 mg | ORAL_TABLET | Freq: Once | ORAL | Status: AC
  Administered 2018-01-03: 12.5 mg via ORAL
  Filled 2018-01-03: qty 1

## 2018-01-03 NOTE — ED Triage Notes (Addendum)
Dizziness which she describes as feeling of off balance x 2 to 3 weeks. States was in [place] mid feb for heart cath due to positive stress test. States needed no stenting at that time. Per husband he has noted her gait being slightly off for 2 to 3 weeks also. Denies chest pain or SOB. States she had been on Losartan and Metoprolol prior to above mentioned hospitalization, added amlodipine at discharge. Dizziness much worse today with nausea. Smile symmetrical, grips and leg strength equal, denies visual changes.

## 2018-01-03 NOTE — ED Provider Notes (Signed)
[facility] Emergency Department Provider Note   ____________________________________________   First MD Initiated Contact with Patient 01/03/18 2050     (approximate)  I have reviewed the triage vital signs and the nursing notes.   HISTORY  Chief Complaint dizzy    HPI Danielle Knapp is a 62 y.o. female reports for about the last 3 weeks she is been experiencing episodes where she feels dizzy.  She describes dizziness as feeling off balance.  She feels like her walking has been difficult as the dizziness comes and goes at times.  She does not identify any one thing that seems to make it clearly better or worse.  No headache no nausea vomiting.  No trouble speaking no weakness in the arms or legs or changes in sensation anywhere.  No chest pain or trouble breathing.  She was recently admitted on a normal coronary cath a couple of months ago.  She reports no ongoing heart problems  Symptoms seem to be slowly worsening, her husband has had to assist her a couple of times because she is felt very off balance when up and walking.   Past Medical History:  Diagnosis Date  . Allergic rhinitis   . Arthritis of shoulder   . Cataracts, bilateral   . Chest pressure   . Chondromalacia of patella   . Depression   . Diverticulitis   . Glaucoma    BORDERLINE IN LEFT EYE  . Heel spur   . HSV infection    TYPE 1 IN HER NOSE,LIP  . Hypertension   . Infertility, female   . Kidney stones 1/11   spontaneously passed dx'd based on blood test & history of cancer  . Mallet finger    LEFT INDEX FINGER  . Osteoarthritis, knee   . Plantar fasciitis   . Plantar fasciitis of left foot   . Polycythemia   . Situational stress   . Stenosis of cervix   . Tennis elbow    -right    Patient Active Problem List   Diagnosis Date Noted  . Chest pain 10/15/2017  . Hypertensive urgency 10/15/2017  . Depression   . Hypertension   . Polycythemia 08/11/2012    Past  Surgical History:  Procedure Laterality Date  . ANAL SPHINCTEROPLASTY  2004   hemorrhoids  . CATARACT EXTRACTION, BILATERAL  2013  . DIAGNOSTIC LAPAROSCOPY  1996  . FINGER SURGERY    . HEEL SPUR SURGERY Right 2015   Dr. Durward Fortes  . LEFT HEART CATH AND CORONARY ANGIOGRAPHY N/A 11/03/2017   Procedure: LEFT HEART CATH AND CORONARY ANGIOGRAPHY;  Surgeon: Jettie Booze, MD;  Location: [facility];  Service: Cardiovascular;  Laterality: N/A;  . SEPTOPLASTY  1970's   x2  . TONSILLECTOMY      Prior to Admission medications   Medication Sig Start Date End Date Taking? Authorizing Provider  amLODipine (NORVASC) 5 MG tablet Take 1 tablet (5 mg total) by mouth daily. 10/27/17   Isaiah Serge, NP  aspirin EC 81 MG EC tablet Take 1 tablet (81 mg total) by mouth daily. 10/17/17   Nita Sells, MD  buPROPion (WELLBUTRIN XL) 300 MG 24 hr tablet Take 300 mg by mouth daily. 02/04/15   [provider]  Calcium Carb-Cholecalciferol (CALCIUM 600 + D) 600-200 MG-UNIT TABS Take 1 tablet by mouth daily.    [provider]  losartan (COZAAR) 100 MG tablet Take 100 mg by mouth daily.    [provider]  meclizine (ANTIVERT) 12.5 MG tablet Take 1 tablet (12.5 mg total) by mouth 3 (three) times daily as needed for dizziness or nausea. 01/04/18   Delman Kitten, MD  Melatonin 10 MG TABS Take 10 mg by mouth at bedtime.    [provider]  metoprolol tartrate (LOPRESSOR) 25 MG tablet Take 0.5 tablets (12.5 mg total) by mouth 2 (two) times daily. 10/16/17 10/16/18  Nita Sells, MD  nitroGLYCERIN (NITROSTAT) 0.4 MG SL tablet DISSOLVE 1 TABLET UNDER THE TONGUE EVERY 5 MINUTES AS NEEDED FOR CHEST PAIN 11/17/17   Isaiah Serge, NP  Omega-3 Fatty Acids (FISH OIL) 1200 MG CAPS Take 1,200 mg by mouth daily.    [provider]  sertraline (ZOLOFT) 100 MG tablet Take 100 mg by mouth 2 (two) times daily.  07/22/12   [provider]  valACYclovir (VALTREX)  1000 MG tablet Take 2,000 mg by mouth 2 (two) times daily as needed (for fever blisters).     [provider]  zolpidem (AMBIEN) 5 MG tablet Take 1 tablet by mouth at bedtime. 03/01/16   [provider]    Allergies Ciprofloxacin hcl; Erythromycin; Flagyl [metronidazole]; Nsaids; Ace inhibitors; Lisinopril; and Lunesta [eszopiclone]  Family History  Problem Relation Age of Onset  . Osteoporosis Mother   . Heart attack Mother   . Alzheimer's disease Mother   . Diabetes Father   . Hypertension Father   . Heart failure Father   . Ovarian cancer Sister   . Stroke Maternal Grandmother     Social History Social History   Tobacco Use  . Smoking status: Never Smoker  . Smokeless tobacco: Never Used  Substance Use Topics  . Alcohol use: Yes    Alcohol/week: 1.8 oz    Types: 3 Glasses of wine per week  . Drug use: No    Review of Systems Constitutional: No fever/chills Eyes: No visual changes.  Denies any double vision. ENT: No sore throat. Cardiovascular: Denies chest pain. Respiratory: Denies shortness of breath. Gastrointestinal: No abdominal pain.  No nausea, no vomiting.  No diarrhea.  No constipation. Genitourinary: Negative for dysuria. Musculoskeletal: Negative for back pain. Skin: Negative for rash. Neurological: Negative for headaches, focal weakness or numbness.  See HPI, describes a feeling of feeling dizzy that feels oftentimes like a bit of a spinning sensation as though she is standing on an abnormal platform.    ____________________________________________   PHYSICAL EXAM:  VITAL SIGNS: ED Triage Vitals  Enc Vitals Group     BP 01/03/18 1650 132/83     Pulse Rate 01/03/18 1650 62     Resp 01/03/18 1650 20     Temp 01/03/18 1650 98.2 F (36.8 C)     Temp Source 01/03/18 1650 Oral     SpO2 01/03/18 1650 99 %     Weight 01/03/18 1651 180 lb (81.6 kg)     Height 01/03/18 1651 5\' 7"  (1.702 m)     Head Circumference --      Peak Flow --       Pain Score 01/03/18 1651 0     Pain Loc --      Pain Edu? --      Excl. in [place]? --     Constitutional: Alert and oriented. Well appearing and in no acute distress.  Patient and her husband are both very pleasant. Eyes: Conjunctivae are normal.  Extraocular movements are normal.  No diplopia. Head: Atraumatic. Nose: No congestion/rhinnorhea. Mouth/Throat: Mucous membranes are moist.  Neck: No stridor.   Cardiovascular: Normal rate, regular rhythm. Grossly normal heart sounds.  Good peripheral circulation. Respiratory: Normal respiratory effort.  No retractions. Lungs CTAB. Gastrointestinal: Soft and nontender. No distention. Musculoskeletal: No lower extremity tenderness nor edema. Neurologic:  Normal speech and language. No gross focal neurologic deficits are appreciated.  Normal finger-nose-finger.  Normal cranial nerves.  No pronator drift bilateral.  Normal sensation across the face arms and legs bilaterally.  Normal strength in the lower extremities bilaterally. Skin:  Skin is warm, dry and intact. No rash noted. Psychiatric: Mood and affect are normal. Speech and behavior are normal.  ____________________________________________   LABS (all labs ordered are listed, but only abnormal results are displayed)  Labs Reviewed  COMPREHENSIVE METABOLIC PANEL - Abnormal; Notable for the following components:      Result Value   Glucose, Bld 122 (*)    All other components within normal limits  CBC  TROPONIN I   ____________________________________________  EKG  Reviewed enterotomy at 1700 Heart rate 55 QRS 85 QTc 440 Mild bradycardia is sinu rhythm, no evidence of ischemia. ____________________________________________  RADIOLOGY  MRI brain pending at time of signout ____________________________________________   PROCEDURES  Procedure(s) performed: None  Procedures  Critical Care performed: No  ____________________________________________   INITIAL  IMPRESSION / ASSESSMENT AND PLAN / ED COURSE  Pertinent labs & imaging results that were available during my care of the patient were reviewed by me and considered in my medical decision making (see chart for details).  Patient presents for evaluation of episodes of feeling off balance and dizzy.  Intermittently but seem to be slowly worsening over last 2 to 3 weeks.  Not associated any hard neurologic deficits.  Reassuring clinical exam no chest pain, recent cardiac work-up as an inpatient.  She denies associated palpitations or arrhythmia.  She has not had a syncopal episode or presyncopal symptoms.  Very reassuring hemodynamics here.  Normal tympanic membranes bilaterally without ear complaint.  Lab work including CBC troponin and metabolic panel are reassuring.  Discussed with the patient and her husband, will obtain an MRI to evaluate for evidence of neurologic etiology including potential subtle stroke.  If this is negative, anticipate discharge and treatment for what sounds to be likely peripheral vertigo but do wish to exclude acute neurologic etiology via MRI at this point.  Clinical Course as of Jan 04 57  Sun Jan 04, 2018  [id number] MRI is pending.  Initial delay as there was some type of miscommunication between placing a order and MRI tech receiving order.  Discussed and apologize the patient and husband were both very understanding.   [MQ]    Clinical Course User Index [MQ] Delman Kitten, MD   Ongoing care assigned to Dr. Dahlia Client.  Follow-up on MRI brain, if normal anticipate discharge with prescription for vertigo and close follow-up with ear nose and throat.    ____________________________________________   FINAL CLINICAL IMPRESSION(S) / ED DIAGNOSES  Final diagnoses:  Balance problems  Vertigo      NEW MEDICATIONS STARTED DURING THIS VISIT:  New Prescriptions   MECLIZINE (ANTIVERT) 12.5 MG TABLET    Take 1 tablet (12.5 mg total) by mouth 3 (three) times daily as needed  for dizziness or nausea.     Note:  This document was prepared using Dragon voice recognition software and may include unintentional dictation errors.     Delman Kitten, MD 01/04/18 [phone]

## 2018-01-03 NOTE — ED Notes (Signed)
Patient states she has been having intermittent rounds of dizziness with no LOC or falls.  She states there is no pattern to her spells, and it happens when she stands up, and when she is walking and at work. She "bumps into stuff".  Pt states her fluid intake has been satisfactory.

## 2018-01-04 ENCOUNTER — Emergency Department: Payer: Commercial Managed Care - PPO

## 2018-01-04 DIAGNOSIS — I10 Essential (primary) hypertension: Secondary | ICD-10-CM | POA: Diagnosis not present

## 2018-01-04 DIAGNOSIS — R42 Dizziness and giddiness: Secondary | ICD-10-CM | POA: Diagnosis not present

## 2018-01-04 DIAGNOSIS — R2681 Unsteadiness on feet: Secondary | ICD-10-CM | POA: Diagnosis not present

## 2018-01-04 MED ORDER — MECLIZINE HCL 25 MG PO TABS: 25.0000 mg | ORAL_TABLET | Freq: Three times a day (TID) | ORAL | 0 refills | Status: DC | PRN

## 2018-01-04 MED ORDER — MECLIZINE HCL 12.5 MG PO TABS: 12.5000 mg | ORAL_TABLET | Freq: Three times a day (TID) | ORAL | 1 refills | Status: DC | PRN

## 2018-01-04 NOTE — ED Provider Notes (Signed)
-----------------------------------------   2:12 AM on 01/04/2018 -----------------------------------------   Blood pressure 127/84, pulse (!) 51, temperature 98.2 F (36.8 C), resp. rate 20, height 5\' 7"  (1.702 m), weight 81.6 kg (180 lb), last menstrual period 09/03/2011, SpO2 98 %.  Assuming care from Dr. Jacqualine Code.  In short, Danielle Knapp is a 62 y.o. female with a chief complaint of dizzy .  Refer to the original H&P for additional details.  The current plan of care is to follow up the results of the MRI .   Clinical Course as of Jan 05 211  Sun Jan 04, 2018  [id number] MRI is pending.  Initial delay as there was some type of miscommunication between placing a order and MRI tech receiving order.  Discussed and apologize the patient and husband were both very understanding.   [MQ]    Clinical Course User Index [MQ] Delman Kitten, MD   MRI brain: No acute intracranial abnormality, mild chronic microvascular ischemic changes and parenchymal volume loss of the brain.  It appears that the MRI is unremarkable.  The patient will be discharged home to follow-up with her primary care physician.   Loney Hering, MD 01/04/18 [phone]

## 2018-01-04 NOTE — ED Notes (Signed)
Pt r/f MRI

## 2018-01-04 NOTE — ED Notes (Signed)
Patient transported to MRI 

## 2018-01-04 NOTE — ED Notes (Signed)
Spoke with MR Tech on-call, they will be here in about an hour

## 2018-01-04 NOTE — Discharge Instructions (Addendum)
Please follow up with ENT for further evaluation of your symptoms

## 2018-01-05 DIAGNOSIS — R42 Dizziness and giddiness: Secondary | ICD-10-CM | POA: Diagnosis not present

## 2018-01-09 DIAGNOSIS — R42 Dizziness and giddiness: Secondary | ICD-10-CM | POA: Diagnosis not present

## 2018-01-10 ENCOUNTER — Encounter: Payer: Self-pay | Admitting: Emergency Medicine

## 2018-01-10 ENCOUNTER — Other Ambulatory Visit: Payer: Self-pay

## 2018-01-10 ENCOUNTER — Emergency Department
Admission: EM | Admit: 2018-01-10 | Discharge: 2018-01-10 | Disposition: A | Discharge status: Home / Self Care | Payer: Commercial Managed Care - PPO | Attending: Emergency Medicine | Admitting: Emergency Medicine

## 2018-01-10 DIAGNOSIS — S0033XA Contusion of nose, initial encounter: Secondary | ICD-10-CM | POA: Diagnosis not present

## 2018-01-10 DIAGNOSIS — Z7982 Long term (current) use of aspirin: Secondary | ICD-10-CM | POA: Diagnosis not present

## 2018-01-10 DIAGNOSIS — S0993XA Unspecified injury of face, initial encounter: Secondary | ICD-10-CM | POA: Diagnosis not present

## 2018-01-10 DIAGNOSIS — Y999 Unspecified external cause status: Secondary | ICD-10-CM | POA: Diagnosis not present

## 2018-01-10 DIAGNOSIS — Z79899 Other long term (current) drug therapy: Secondary | ICD-10-CM | POA: Diagnosis not present

## 2018-01-10 DIAGNOSIS — W010XXA Fall on same level from slipping, tripping and stumbling without subsequent striking against object, initial encounter: Secondary | ICD-10-CM | POA: Insufficient documentation

## 2018-01-10 DIAGNOSIS — Y92009 Unspecified place in unspecified non-institutional (private) residence as the place of occurrence of the external cause: Secondary | ICD-10-CM | POA: Diagnosis not present

## 2018-01-10 DIAGNOSIS — Y9301 Activity, walking, marching and hiking: Secondary | ICD-10-CM | POA: Diagnosis not present

## 2018-01-10 DIAGNOSIS — R0789 Other chest pain: Secondary | ICD-10-CM | POA: Diagnosis not present

## 2018-01-10 DIAGNOSIS — I10 Essential (primary) hypertension: Secondary | ICD-10-CM | POA: Insufficient documentation

## 2018-01-10 DIAGNOSIS — S50311A Abrasion of right elbow, initial encounter: Secondary | ICD-10-CM | POA: Diagnosis not present

## 2018-01-10 LAB — CBC
HEMATOCRIT: 44.8 % (ref 35.0–47.0)
HEMOGLOBIN: 15.9 g/dL (ref 12.0–16.0)
MCH: 32.7 pg (ref 26.0–34.0)
MCHC: 35.6 g/dL (ref 32.0–36.0)
MCV: 91.7 fL (ref 80.0–100.0)
Platelets: 269 10*3/uL (ref 150–440)
RBC: 4.88 MIL/uL (ref 3.80–5.20)
RDW: 13 % (ref 11.5–14.5)
WBC: 9.8 10*3/uL (ref 3.6–11.0)

## 2018-01-10 LAB — BASIC METABOLIC PANEL
ANION GAP: 5 (ref 5–15)
BUN: 18 mg/dL (ref 6–20)
CO2: 28 mmol/L (ref 22–32)
Calcium: 9.5 mg/dL (ref 8.9–10.3)
Chloride: 106 mmol/L (ref 101–111)
Creatinine, Ser: 0.98 mg/dL (ref 0.44–1.00)
GFR calc Af Amer: >60 mL/min (ref >60)
GLUCOSE: 120 mg/dL — AB (ref 65–99)
Potassium: 4.9 mmol/L (ref 3.5–5.1)
Sodium: 139 mmol/L (ref 135–145)

## 2018-01-10 MED ORDER — AMOXICILLIN-POT CLAVULANATE 875-125 MG PO TABS: 1.0000 | ORAL_TABLET | Freq: Two times a day (BID) | ORAL | 0 refills | Status: AC

## 2018-01-10 NOTE — ED Provider Notes (Signed)
[facility] Emergency Department Provider Note  ____________________________________________   I have reviewed the triage vital signs and the nursing notes.   HISTORY  Chief Complaint Fall; Facial Injury; and Dizziness   History limited by: Not Limited   HPI Danielle Knapp is a 62 y.o. female who presents to the emergency department today because of concern for possible nasal bone fracture.  Patient states that she got up out of her chair suddenly and ran to break up her cats were fighting.  She thinks she tripped over a cat scratch past.  She did not land on her elbows and face.  She denies any loss of consciousness or blacking out.  Complaining primarily of discomfort of her nose.  She states she is able to breathe through both nostrils.  She feels like her teeth are well aligned.  She was able to get up and bear weight on her legs after the fall.     Per medical record review patient has a history of recent ED visit for vertigo.  Past Medical History:  Diagnosis Date  . Allergic rhinitis   . Arthritis of shoulder   . Cataracts, bilateral   . Chest pressure   . Chondromalacia of patella   . Depression   . Diverticulitis   . Glaucoma    BORDERLINE IN LEFT EYE  . Heel spur   . HSV infection    TYPE 1 IN HER NOSE,LIP  . Hypertension   . Infertility, female   . Kidney stones 1/11   spontaneously passed dx'd based on blood test & history of cancer  . Mallet finger    LEFT INDEX FINGER  . Osteoarthritis, knee   . Plantar fasciitis   . Plantar fasciitis of left foot   . Polycythemia   . Situational stress   . Stenosis of cervix   . Tennis elbow    -right    Patient Active Problem List   Diagnosis Date Noted  . Chest pain 10/15/2017  . Hypertensive urgency 10/15/2017  . Depression   . Hypertension   . Polycythemia 08/11/2012    Past Surgical History:  Procedure Laterality Date  . ANAL SPHINCTEROPLASTY  2004   hemorrhoids  . CATARACT  EXTRACTION, BILATERAL  2013  . DIAGNOSTIC LAPAROSCOPY  1996  . FINGER SURGERY    . HEEL SPUR SURGERY Right 2015   Dr. Durward Fortes  . LEFT HEART CATH AND CORONARY ANGIOGRAPHY N/A 11/03/2017   Procedure: LEFT HEART CATH AND CORONARY ANGIOGRAPHY;  Surgeon: Jettie Booze, MD;  Location: [facility];  Service: Cardiovascular;  Laterality: N/A;  . SEPTOPLASTY  1970's   x2  . TONSILLECTOMY      Prior to Admission medications   Medication Sig Start Date End Date Taking? Authorizing Provider  amLODipine (NORVASC) 5 MG tablet Take 1 tablet (5 mg total) by mouth daily. 10/27/17   Isaiah Serge, NP  aspirin EC 81 MG EC tablet Take 1 tablet (81 mg total) by mouth daily. 10/17/17   Nita Sells, MD  buPROPion (WELLBUTRIN XL) 300 MG 24 hr tablet Take 300 mg by mouth daily. 02/04/15   [provider]  Calcium Carb-Cholecalciferol (CALCIUM 600 + D) 600-200 MG-UNIT TABS Take 1 tablet by mouth daily.    [provider]  losartan (COZAAR) 100 MG tablet Take 100 mg by mouth daily.    [provider]  meclizine (ANTIVERT) 12.5 MG tablet Take 1 tablet (12.5 mg total) by mouth 3 (three)  times daily as needed for dizziness or nausea. 01/04/18   Delman Kitten, MD  meclizine (ANTIVERT) 25 MG tablet Take 1 tablet (25 mg total) by mouth 3 (three) times daily as needed for dizziness. 01/04/18   Loney Hering, MD  Melatonin 10 MG TABS Take 10 mg by mouth at bedtime.    [provider]  metoprolol tartrate (LOPRESSOR) 25 MG tablet Take 0.5 tablets (12.5 mg total) by mouth 2 (two) times daily. 10/16/17 10/16/18  Nita Sells, MD  nitroGLYCERIN (NITROSTAT) 0.4 MG SL tablet DISSOLVE 1 TABLET UNDER THE TONGUE EVERY 5 MINUTES AS NEEDED FOR CHEST PAIN 11/17/17   Isaiah Serge, NP  Omega-3 Fatty Acids (FISH OIL) 1200 MG CAPS Take 1,200 mg by mouth daily.    [provider]  sertraline (ZOLOFT) 100 MG tablet Take 100 mg by mouth 2 (two) times daily.  07/22/12    [provider]  valACYclovir (VALTREX) 1000 MG tablet Take 2,000 mg by mouth 2 (two) times daily as needed (for fever blisters).     [provider]  zolpidem (AMBIEN) 5 MG tablet Take 1 tablet by mouth at bedtime. 03/01/16   [provider]    Allergies Ciprofloxacin hcl; Erythromycin; Flagyl [metronidazole]; Nsaids; Ace inhibitors; Lisinopril; and Lunesta [eszopiclone]  Family History  Problem Relation Age of Onset  . Osteoporosis Mother   . Heart attack Mother   . Alzheimer's disease Mother   . Diabetes Father   . Hypertension Father   . Heart failure Father   . Ovarian cancer Sister   . Stroke Maternal Grandmother     Social History Social History   Tobacco Use  . Smoking status: Never Smoker  . Smokeless tobacco: Never Used  Substance Use Topics  . Alcohol use: Yes    Alcohol/week: 1.8 oz    Types: 3 Glasses of wine per week  . Drug use: No    Review of Systems Constitutional: No fever/chills Eyes: No visual changes. ENT: Positive for nose swelling. Cardiovascular: Denies chest pain. Respiratory: Denies shortness of breath. Gastrointestinal: No abdominal pain.  No nausea, no vomiting.  No diarrhea.   Genitourinary: Negative for dysuria. Musculoskeletal: Some right elbow and chest pain Skin: Abrasion to right elbow. Neurological: Negative for headaches, focal weakness or numbness.  ____________________________________________   PHYSICAL EXAM:  VITAL SIGNS: ED Triage Vitals  Enc Vitals Group     BP 01/10/18 1850 (!) 185/82     Pulse Rate 01/10/18 1850 60     Resp 01/10/18 1850 16     Temp 01/10/18 1850 98.8 F (37.1 C)     Temp Source 01/10/18 1850 Oral     SpO2 01/10/18 1850 98 %     Weight 01/10/18 1851 180 lb (81.6 kg)     Height 01/10/18 1851 5\' 7"  (1.702 m)     Head Circumference --      Peak Flow --      Pain Score 01/10/18 1850 4   Constitutional: Alert and oriented. Well appearing and in no distress. Eyes:  Conjunctivae are normal.  ENT   Head: Normocephalic and atraumatic.   Nose: Some swelling to the bridge of the nose, small laceration. No septal hematoma, small amount of blood in left nares.    Mouth/Throat: Mucous membranes are moist.   Neck: No stridor. No midline tenderness Hematological/Lymphatic/Immunilogical: No cervical lymphadenopathy. Cardiovascular: Normal rate, regular rhythm.  No murmurs, rubs, or gallops.  Respiratory: Normal respiratory effort without tachypnea nor retractions. Breath sounds  are clear and equal bilaterally. No wheezes/rales/rhonchi. Gastrointestinal: Soft and non tender. No rebound. No guarding.  Genitourinary: Deferred Musculoskeletal: Normal range of motion in all extremities. No lower extremity edema. Neurologic:  Normal speech and language. No gross focal neurologic deficits are appreciated.  Skin:  Small abrasion to right elbow. Psychiatric: Mood and affect are normal. Speech and behavior are normal. Patient exhibits appropriate insight and judgment.  ____________________________________________    LABS (pertinent positives/negatives)  BMP wnl except glu 120 CBC wnl  ____________________________________________   EKG  I, Nance Pear, attending physician, personally viewed and interpreted this EKG  EKG Time: 1858 Rate: 54 Rhythm: sinus bradycardia Axis: normal Intervals: qtc 417 QRS: narrow ST changes: no st elevation Impression: normal ekg  ____________________________________________    RADIOLOGY  None  ____________________________________________   PROCEDURES  Procedures  ____________________________________________   INITIAL IMPRESSION / ASSESSMENT AND PLAN / ED COURSE  Pertinent labs & imaging results that were available during my care of the patient were reviewed by me and considered in my medical decision making (see chart for details).  Patient presented to the emergency department today with  concerns for possible broken nose after a fall.  Does sound like it was a mechanical fall.  On exam patient does have some swelling to that nose.  At this point discussed that it could be a small fracture.  She was not interested in obtaining a CT scan to confirm.  Think this is completely reasonable.  No other concerning traumatic injuries found.  She does have a small skin abrasion to the right elbow although has full and complete range of motion without any tenderness.  Discussed importance of following up with ENT.  She says she really has ENT follow-up scheduled secondary to vertigo.  ____________________________________________   FINAL CLINICAL IMPRESSION(S) / ED DIAGNOSES  Final diagnoses:  Facial injury, initial encounter     Note: This dictation was prepared with Dragon dictation. Any transcriptional errors that result from this process are unintentional     Nance Pear, MD 01/10/18 2049

## 2018-01-10 NOTE — ED Triage Notes (Signed)
Pt states she was resting today and jumped up fast, lost her balance and fell, states she landed on her right elbow, face and c/o pain in her upper chest/clavical area. Denies LOC. Was seen here last week with vertigo. Small lac and swelling on nose noted.

## 2018-01-10 NOTE — Discharge Instructions (Addendum)
Please seek medical attention for any high fevers, chest pain, shortness of breath, change in behavior, persistent vomiting, bloody stool or any other new or concerning symptoms.  

## 2018-01-14 DIAGNOSIS — R42 Dizziness and giddiness: Secondary | ICD-10-CM | POA: Diagnosis not present

## 2018-01-14 DIAGNOSIS — S59901A Unspecified injury of right elbow, initial encounter: Secondary | ICD-10-CM | POA: Diagnosis not present

## 2018-01-14 DIAGNOSIS — S022XXA Fracture of nasal bones, initial encounter for closed fracture: Secondary | ICD-10-CM | POA: Diagnosis not present

## 2018-01-14 DIAGNOSIS — W010XXA Fall on same level from slipping, tripping and stumbling without subsequent striking against object, initial encounter: Secondary | ICD-10-CM | POA: Diagnosis not present

## 2018-01-19 ENCOUNTER — Ambulatory Visit (AMBULATORY_SURGERY_CENTER): Payer: Self-pay | Admitting: *Deleted

## 2018-01-19 ENCOUNTER — Other Ambulatory Visit: Payer: Self-pay

## 2018-01-19 VITALS — Ht 67.0 in | Wt 181.0 lb

## 2018-01-19 DIAGNOSIS — Z1211 Encounter for screening for malignant neoplasm of colon: Secondary | ICD-10-CM

## 2018-01-19 NOTE — Progress Notes (Signed)
No egg or soy allergy known to patient  No issues with past sedation with any surgeries  or procedures, no intubation problems  No diet pills per patient No home 02 use per patient  No blood thinners per patient  Pt denies issues with constipation  No A fib or A flutter  EMMI video sent to pt's e mail  

## 2018-01-22 DIAGNOSIS — H26492 Other secondary cataract, left eye: Secondary | ICD-10-CM | POA: Diagnosis not present

## 2018-01-22 DIAGNOSIS — H524 Presbyopia: Secondary | ICD-10-CM | POA: Diagnosis not present

## 2018-01-22 DIAGNOSIS — H52203 Unspecified astigmatism, bilateral: Secondary | ICD-10-CM | POA: Diagnosis not present

## 2018-02-02 ENCOUNTER — Other Ambulatory Visit: Payer: Self-pay

## 2018-02-02 ENCOUNTER — Ambulatory Visit (AMBULATORY_SURGERY_CENTER): Payer: Commercial Managed Care - PPO | Admitting: Internal Medicine

## 2018-02-02 ENCOUNTER — Encounter: Payer: Self-pay | Admitting: Internal Medicine

## 2018-02-02 VITALS — BP 121/67 | HR 53 | Temp 98.4°F | Resp 13 | Ht 67.0 in | Wt 182.0 lb

## 2018-02-02 DIAGNOSIS — D122 Benign neoplasm of ascending colon: Secondary | ICD-10-CM

## 2018-02-02 DIAGNOSIS — Z1211 Encounter for screening for malignant neoplasm of colon: Secondary | ICD-10-CM | POA: Diagnosis present

## 2018-02-02 DIAGNOSIS — K635 Polyp of colon: Secondary | ICD-10-CM

## 2018-02-02 DIAGNOSIS — I10 Essential (primary) hypertension: Secondary | ICD-10-CM | POA: Diagnosis not present

## 2018-02-02 DIAGNOSIS — I2510 Atherosclerotic heart disease of native coronary artery without angina pectoris: Secondary | ICD-10-CM | POA: Diagnosis not present

## 2018-02-02 DIAGNOSIS — D125 Benign neoplasm of sigmoid colon: Secondary | ICD-10-CM

## 2018-02-02 DIAGNOSIS — D120 Benign neoplasm of cecum: Secondary | ICD-10-CM | POA: Diagnosis not present

## 2018-02-02 MED ORDER — SODIUM CHLORIDE 0.9 % IV SOLN: 500.0000 mL | Freq: Once | INTRAVENOUS | Status: DC

## 2018-02-02 NOTE — Progress Notes (Signed)
Called to room to assist during endoscopic procedure.  Patient ID and intended procedure confirmed with present staff. Received instructions for my participation in the procedure from the performing physician.  

## 2018-02-02 NOTE — Op Note (Signed)
[facility] Patient Name: Danielle Knapp Procedure Date: 02/02/2018 2:15 PM MRN: [id number] Endoscopist: Gatha Mayer , MD Age: 62 Referring MD:  Date of Birth: [birth year] Gender: Female Account #: [id number] Procedure:                Colonoscopy Indications:              Screening for colorectal malignant neoplasm, Last                            colonoscopy: 2009 Medicines:                Propofol per Anesthesia, Monitored Anesthesia Care Procedure:                Pre-Anesthesia Assessment:                           - Prior to the procedure, a History and Physical                            was performed, and patient medications and                            allergies were reviewed. The patient's tolerance of                            previous anesthesia was also reviewed. The risks                            and benefits of the procedure and the sedation                            options and risks were discussed with the patient.                            All questions were answered, and informed consent                            was obtained. Prior Anticoagulants: The patient has                            taken no previous anticoagulant or antiplatelet                            agents. ASA Grade Assessment: II - A patient with                            mild systemic disease. After reviewing the risks                            and benefits, the patient was deemed in                            satisfactory condition to undergo the procedure.  After obtaining informed consent, the colonoscope                            was passed under direct vision. Throughout the                            procedure, the patient's blood pressure, pulse, and                            oxygen saturations were monitored continuously. The                            Colonoscope was introduced through the anus and                            advanced to the the  cecum, identified by                            appendiceal orifice and ileocecal valve. The                            colonoscopy was performed without difficulty. The                            patient tolerated the procedure well. The quality                            of the bowel preparation was good. The ileocecal                            valve, appendiceal orifice, and rectum were                            photographed. The bowel preparation used was                            Miralax. Scope In: 2:33:20 PM Scope Out: 2:45:14 PM Scope Withdrawal Time: 0 hours 10 minutes 8 seconds  Total Procedure Duration: 0 hours 11 minutes 54 seconds  Findings:                 Two sessile polyps were found in the sigmoid colon                            and ascending colon. The polyps were 4 to 8 mm in                            size. These polyps were removed with a cold snare.                            Resection and retrieval were complete. Verification                            of patient identification for the specimen was  done. Estimated blood loss was minimal.                           Multiple diverticula were found in the sigmoid                            colon.                           External and internal hemorrhoids were found during                            retroflexion.                           The exam was otherwise without abnormality on                            direct and retroflexion views. Complications:            No immediate complications. Estimated Blood Loss:     Estimated blood loss was minimal. Impression:               - Two 4 to 8 mm polyps in the sigmoid colon and in                            the ascending colon, removed with a cold snare.                            Resected and retrieved.                           - Diverticulosis in the sigmoid colon.                           - External and internal hemorrhoids.                            - The examination was otherwise normal on direct                            and retroflexion views. Recommendation:           - Patient has a contact number available for                            emergencies. The signs and symptoms of potential                            delayed complications were discussed with the                            patient. Return to normal activities tomorrow.                            Written discharge instructions were provided to the  patient.                           - Resume previous diet.                           - Continue present medications.                           - Repeat colonoscopy is recommended. The                            colonoscopy date will be determined after pathology                            results from today's exam become available for                            review. Gatha Mayer, MD 02/02/2018 2:50:55 PM This report has been signed electronically.

## 2018-02-02 NOTE — Patient Instructions (Signed)
   Information on polyps, diverticulosis, and hemorrhoids provided to you today.  Await pathology results.     YOU HAD AN ENDOSCOPIC PROCEDURE TODAY AT [facility]:   Refer to the procedure report that was given to you for any specific questions about what was found during the examination.  If the procedure report does not answer your questions, please call your gastroenterologist to clarify.  If you requested that your care partner not be given the details of your procedure findings, then the procedure report has been included in a sealed envelope for you to review at your convenience later.  YOU SHOULD EXPECT: Some feelings of bloating in the abdomen. Passage of more gas than usual.  Walking can help get rid of the air that was put into your GI tract during the procedure and reduce the bloating. If you had a lower endoscopy (such as a colonoscopy or flexible sigmoidoscopy) you may notice spotting of blood in your stool or on the toilet paper. If you underwent a bowel prep for your procedure, you may not have a normal bowel movement for a few days.  Please Note:  You might notice some irritation and congestion in your nose or some drainage.  This is from the oxygen used during your procedure.  There is no need for concern and it should clear up in a day or so.  SYMPTOMS TO REPORT IMMEDIATELY:   Following lower endoscopy (colonoscopy or flexible sigmoidoscopy):  Excessive amounts of blood in the stool  Significant tenderness or worsening of abdominal pains  Swelling of the abdomen that is new, acute  Fever of 100F or higher   For urgent or emergent issues, a gastroenterologist can be reached at any hour by calling [phone].   DIET:  We do recommend a small meal at first, but then you may proceed to your regular diet.  Drink plenty of fluids but you should avoid alcoholic beverages for 24 hours.  ACTIVITY:  You should plan to take it easy for the rest of today and  you should NOT DRIVE or use heavy machinery until tomorrow (because of the sedation medicines used during the test).    FOLLOW UP: Our staff will call the number listed on your records the next business day following your procedure to check on you and address any questions or concerns that you may have regarding the information given to you following your procedure. If we do not reach you, we will leave a message.  However, if you are feeling well and you are not experiencing any problems, there is no need to return our call.  We will assume that you have returned to your regular daily activities without incident.  If any biopsies were taken you will be contacted by phone or by letter within the next 1-3 weeks.  Please call us at [phone] if you have not heard about the biopsies in 3 weeks.    SIGNATURES/CONFIDENTIALITY: You and/or your care partner have signed paperwork which will be entered into your electronic medical record.  These signatures attest to the fact that that the information above on your After Visit Summary has been reviewed and is understood.  Full responsibility of the confidentiality of this discharge information lies with you and/or your care-partner.

## 2018-02-02 NOTE — Progress Notes (Signed)
Pt's states no medical or surgical changes since previsit or office visit. 

## 2018-02-02 NOTE — Progress Notes (Signed)
Report to PACU, RN, vss, BBS= Clear.  

## 2018-02-03 ENCOUNTER — Telehealth: Payer: Self-pay | Admitting: *Deleted

## 2018-02-03 NOTE — Telephone Encounter (Signed)
  Follow up Call-  Call back number 02/02/2018  Post procedure Call Back phone  # [phone]  Permission to leave phone message Yes  Some recent data might be hidden     Patient questions:  Do you have a fever, pain , or abdominal swelling? No. Pain Score  0 *  Have you tolerated food without any problems? Yes.    Have you been able to return to your normal activities? Yes.    Do you have any questions about your discharge instructions: Diet   No. Medications  No. Follow up visit  No.  Do you have questions or concerns about your Care? No.  Actions: * If pain score is 4 or above: No action needed, pain <4.

## 2018-02-04 DIAGNOSIS — H26492 Other secondary cataract, left eye: Secondary | ICD-10-CM | POA: Diagnosis not present

## 2018-02-08 ENCOUNTER — Encounter: Payer: Self-pay | Admitting: Internal Medicine

## 2018-02-08 DIAGNOSIS — Z860101 Personal history of adenomatous and serrated colon polyps: Secondary | ICD-10-CM

## 2018-02-08 DIAGNOSIS — Z86010 Personal history of colonic polyps: Secondary | ICD-10-CM

## 2018-02-08 HISTORY — DX: Personal history of adenomatous and serrated colon polyps: Z86.0101

## 2018-02-08 HISTORY — DX: Personal history of colonic polyps: Z86.010

## 2018-02-08 NOTE — Progress Notes (Signed)
2 adenomas Recall 2024 My Chart

## 2018-02-13 DIAGNOSIS — D1801 Hemangioma of skin and subcutaneous tissue: Secondary | ICD-10-CM | POA: Diagnosis not present

## 2018-02-13 DIAGNOSIS — B078 Other viral warts: Secondary | ICD-10-CM | POA: Diagnosis not present

## 2018-02-13 DIAGNOSIS — L812 Freckles: Secondary | ICD-10-CM | POA: Diagnosis not present

## 2018-02-13 DIAGNOSIS — L570 Actinic keratosis: Secondary | ICD-10-CM | POA: Diagnosis not present

## 2018-02-13 DIAGNOSIS — D692 Other nonthrombocytopenic purpura: Secondary | ICD-10-CM | POA: Diagnosis not present

## 2018-03-12 DIAGNOSIS — I10 Essential (primary) hypertension: Secondary | ICD-10-CM | POA: Diagnosis not present

## 2018-03-19 NOTE — Progress Notes (Signed)
62 y.o. [place] Married Caucasian female here for annual exam.    Stopped her HRT and having hot flashes.   No pain or burning with intercourse.  Decreased interest in sex. Not concerned about this.    Lost a Fontanella weight.   Had a lot of medical evaluation during the last year.  States she had a negative cardiac work up.   Work stress.  Mother died 3 weeks ago of dementia and pneumonia.  Wellbutrin and Zoloft are working well to control anxiety and depression.   PCP:   Harlan Stains, MD  Patient's last menstrual period was 09/03/2011.           Sexually active: Yes.    The current method of family planning is post menopausal status.    Exercising: Yes.    walking Smoker:  no  Health Maintenance: Pap:  02-21-15 negative, HR HPV negative            01-27-13 negative, HR HPV negative  History of abnormal Pap:  Yes, 2013 had colposcopy with Dr. Felicita Gage neg. No treatment to cervix. MMG:  10-23-16 density C/BIRADS 2 benign, done in 2019 at [facility] per patient  Colonoscopy:  02-02-18, 2 adenomas, due again 2024 BMD:   10-23-16  Result  Osteopenia  TDaP:  07-18-11  HIV: 10-15-17 negative   Hep C: donates blood  Screening Labs:  Hb today: PCP, Urine today: not collected   reports that she has never smoked. She has never used smokeless tobacco. She reports that she drinks about 1.8 oz of alcohol per week. She reports that she does not use drugs.  Past Medical History:  Diagnosis Date  . Allergic rhinitis   . Allergy   . Arthritis of shoulder   . Cataracts, bilateral    removed with lens implants   . Chest pressure   . Chondromalacia of patella   . Depression   . Diverticulitis   . Glaucoma    BORDERLINE IN LEFT EYE  . Heel spur   . HSV infection    TYPE 1 IN HER NOSE,LIP  . Hx of adenomatous colonic polyps 02/08/2018  . Hypertension   . Infertility, female   . Kidney stones 1/11   spontaneously passed dx'd based on blood test & history of cancer  . Mallet finger     LEFT INDEX FINGER  . Osteoarthritis, knee   . Osteoporosis   . Plantar fasciitis   . Plantar fasciitis of left foot   . Polycythemia   . Situational stress   . Stenosis of cervix   . Tennis elbow    -right    Past Surgical History:  Procedure Laterality Date  . ANAL SPHINCTEROPLASTY  2004   hemorrhoids  . CATARACT EXTRACTION, BILATERAL  2013  . COLONOSCOPY  2009   Gessner- Tics only   . DIAGNOSTIC LAPAROSCOPY  1996  . FINGER SURGERY    . HEEL SPUR SURGERY Right 2015   Dr. Durward Fortes  . LEFT HEART CATH AND CORONARY ANGIOGRAPHY N/A 11/03/2017   Procedure: LEFT HEART CATH AND CORONARY ANGIOGRAPHY;  Surgeon: Jettie Booze, MD;  Location: [facility];  Service: Cardiovascular;  Laterality: N/A;  . SEPTOPLASTY  1970's   x2  . TONSILLECTOMY      Current Outpatient Medications  Medication Sig Dispense Refill  . amLODipine (NORVASC) 2.5 MG tablet TK 1 T PO QD  1  . aspirin EC 81 MG EC tablet Take 1 tablet (81 mg total) by mouth  daily.    . buPROPion (WELLBUTRIN XL) 300 MG 24 hr tablet Take 300 mg by mouth daily.  0  . Calcium Carb-Cholecalciferol (CALCIUM 600 + D) 600-200 MG-UNIT TABS Take 1 tablet by mouth daily.    Marland Kitchen losartan (COZAAR) 100 MG tablet Take 100 mg by mouth daily.    . metoprolol tartrate (LOPRESSOR) 25 MG tablet Take 0.5 tablets (12.5 mg total) by mouth 2 (two) times daily. 60 tablet 0  . nitroGLYCERIN (NITROSTAT) 0.4 MG SL tablet DISSOLVE 1 TABLET UNDER THE TONGUE EVERY 5 MINUTES AS NEEDED FOR CHEST PAIN 25 tablet 5  . Omega-3 Fatty Acids (FISH OIL) 1200 MG CAPS Take 1,200 mg by mouth daily.    . sertraline (ZOLOFT) 100 MG tablet Take 100 mg by mouth 2 (two) times daily.     . valACYclovir (VALTREX) 1000 MG tablet Take 2,000 mg by mouth 2 (two) times daily as needed (for fever blisters).     . zolpidem (AMBIEN) 5 MG tablet Take 1 tablet by mouth at bedtime.  0  . meclizine (ANTIVERT) 12.5 MG tablet Take 1 tablet (12.5 mg total) by mouth 3 (three) times  daily as needed for dizziness or nausea. (Patient not taking: Reported on 01/19/2018) 30 tablet 1  . meclizine (ANTIVERT) 25 MG tablet Take 1 tablet (25 mg total) by mouth 3 (three) times daily as needed for dizziness. (Patient not taking: Reported on 02/02/2018) 30 tablet 0   No current facility-administered medications for this visit.     Family History  Problem Relation Age of Onset  . Osteoporosis Mother   . Heart attack Mother   . Alzheimer's disease Mother   . Diabetes Father   . Hypertension Father   . Heart failure Father   . Ovarian cancer Sister   . Stroke Maternal Grandmother   . Colon cancer Neg Hx   . Colon polyps Neg Hx   . Esophageal cancer Neg Hx   . Rectal cancer Neg Hx   . Stomach cancer Neg Hx     Review of Systems  Constitutional: Negative.   HENT: Negative.   Eyes: Negative.   Respiratory: Negative.   Cardiovascular: Negative.   Gastrointestinal: Negative.   Endocrine: Positive for heat intolerance.  Genitourinary: Positive for frequency and urgency.       Loss of urine with cough or sneeze Loss of sexual interest  Musculoskeletal: Negative.   Skin: Negative.   Allergic/Immunologic: Negative.   Neurological: Negative.   Hematological: Bruises/bleeds easily.  Psychiatric/Behavioral: Positive for dysphoric mood. The patient is nervous/anxious.     Exam:   BP 108/64 (BP Location: Right Arm, Patient Position: Sitting, Cuff Size: Normal)   Pulse 62   Resp 14   Ht 5\' 6"  (1.676 m)   Wt 178 lb 4 oz (80.9 kg)   LMP 09/03/2011   BMI 28.77 kg/m     General appearance: alert, cooperative and appears stated age Head: Normocephalic, without obvious abnormality, atraumatic Neck: no adenopathy, supple, symmetrical, trachea midline and thyroid normal to inspection and palpation Lungs: clear to auscultation bilaterally Breasts: normal appearance, no masses or tenderness, No nipple retraction or dimpling, No nipple discharge or bleeding, No axillary or  supraclavicular adenopathy Heart: regular rate and rhythm Abdomen: soft, non-tender; no masses, no organomegaly Extremities: extremities normal, atraumatic, no cyanosis or edema Skin: Skin color, texture, turgor normal. No rashes or lesions Lymph nodes: Cervical, supraclavicular, and axillary nodes normal. No abnormal inguinal nodes palpated Neurologic: Grossly normal  Pelvic: External genitalia:  no lesions              Urethra:  normal appearing urethra with no masses, tenderness or lesions              Bartholins and Skenes: normal                 Vagina: normal appearing vagina with normal color and discharge, no lesions              Cervix: no lesions              Pap taken: Yes.   Bimanual Exam:  Uterus:  normal size, contour, position, consistency, mobility, non-tender              Adnexa: no mass, fullness, tenderness              Rectal exam: Yes.  .  Confirms.              Anus:  normal sphincter tone, no lesions  Chaperone was present for exam.  Assessment:   Well woman visit with normal exam. Osteopenia.  PCP following.  HSV 1. Stress incontinence - mild.  Depression/anxiety/bereavement.  On medication.  Menopausal symptoms.  HTN.  Plan: Mammogram screening. Recommended self breast awareness. Pap and HR HPV as above. Guidelines for Calcium, Vitamin D, regular exercise program including cardiovascular and weight bearing exercise. BMD next year.  Support given for loss of mother.  We talked about Zoloft, herbal options, and Catapres, which she would need to receive from her PCP. Follow up annually and prn.   After visit summary provided.

## 2018-03-20 ENCOUNTER — Other Ambulatory Visit: Payer: Self-pay

## 2018-03-20 ENCOUNTER — Encounter: Payer: Self-pay | Admitting: Obstetrics and Gynecology

## 2018-03-20 ENCOUNTER — Ambulatory Visit: Payer: Commercial Managed Care - PPO | Admitting: Obstetrics and Gynecology

## 2018-03-20 ENCOUNTER — Other Ambulatory Visit (HOSPITAL_COMMUNITY)
Admission: RE | Admit: 2018-03-20 | Discharge: 2018-03-20 | Disposition: A | Discharge status: Home / Self Care | Payer: Commercial Managed Care - PPO | Source: Ambulatory Visit | Attending: Obstetrics and Gynecology | Admitting: Obstetrics and Gynecology

## 2018-03-20 VITALS — BP 108/64 | HR 62 | Resp 14 | Ht 66.0 in | Wt 178.2 lb

## 2018-03-20 DIAGNOSIS — Z01419 Encounter for gynecological examination (general) (routine) without abnormal findings: Secondary | ICD-10-CM

## 2018-03-20 NOTE — Patient Instructions (Signed)
Consider Catapres for control of hot flashes and blood pressure.  EXERCISE AND DIET:  We recommended that you start or continue a regular exercise program for good health. Regular exercise means any activity that makes your heart beat faster and makes you sweat.  We recommend exercising at least 30 minutes per day at least 3 days a week, preferably 4 or 5.  We also recommend a diet low in fat and sugar.  Inactivity, poor dietary choices and obesity can cause diabetes, heart attack, stroke, and kidney damage, among others.    ALCOHOL AND SMOKING:  Women should limit their alcohol intake to no more than 7 drinks/beers/glasses of wine (combined, not each!) per week. Moderation of alcohol intake to this level decreases your risk of breast cancer and liver damage. And of course, no recreational drugs are part of a healthy lifestyle.  And absolutely no smoking or even second hand smoke. Most people know smoking can cause heart and lung diseases, but did you know it also contributes to weakening of your bones? Aging of your skin?  Yellowing of your teeth and nails?  CALCIUM AND VITAMIN D:  Adequate intake of calcium and Vitamin D are recommended.  The recommendations for exact amounts of these supplements seem to change often, but generally speaking 600 mg of calcium (either carbonate or citrate) and 800 units of Vitamin D per day seems prudent. Certain women may benefit from higher intake of Vitamin D.  If you are among these women, your doctor will have told you during your visit.    PAP SMEARS:  Pap smears, to check for cervical cancer or precancers,  have traditionally been done yearly, although recent scientific advances have shown that most women can have pap smears less often.  However, every woman still should have a physical exam from her gynecologist every year. It will include a breast check, inspection of the vulva and vagina to check for abnormal growths or skin changes, a visual exam of the cervix,  and then an exam to evaluate the size and shape of the uterus and ovaries.  And after 63 years of age, a rectal exam is indicated to check for rectal cancers. We will also provide age appropriate advice regarding health maintenance, like when you should have certain vaccines, screening for sexually transmitted diseases, bone density testing, colonoscopy, mammograms, etc.   MAMMOGRAMS:  All women over 65 years old should have a yearly mammogram. Many facilities now offer a "3D" mammogram, which may cost around $50 extra out of pocket. If possible,  we recommend you accept the option to have the 3D mammogram performed.  It both reduces the number of women who will be called back for extra views which then turn out to be normal, and it is better than the routine mammogram at detecting truly abnormal areas.    COLONOSCOPY:  Colonoscopy to screen for colon cancer is recommended for all women at age 42.  We know, you hate the idea of the prep.  We agree, BUT, having colon cancer and not knowing it is worse!!  Colon cancer so often starts as a polyp that can be seen and removed at colonscopy, which can quite literally save your life!  And if your first colonoscopy is normal and you have no family history of colon cancer, most women don't have to have it again for 10 years.  Once every ten years, you can do something that may end up saving your life, right?  We will be  happy to help you get it scheduled when you are ready.  Be sure to check your insurance coverage so you understand how much it will cost.  It may be covered as a preventative service at no cost, but you should check your particular policy.    Menopause and Herbal Products What is menopause? Menopause is the normal time of life when menstrual periods decrease in frequency and eventually stop completely. This process can take several years for some women. Menopause is complete when you have had an absence of menstruation for a full year since your last  menstrual period. It usually occurs between the ages of 16 and 64. It is not common for menopause to begin before the age of 75. During menopause, your body stops producing the female hormones estrogen and progesterone. Common symptoms associated with this loss of hormones (vasomotor symptoms) are:  Hot flashes.  Hot flushes.  Night sweats.  Other common symptoms and complications of menopause include:  Decrease in sex drive.  Vaginal dryness and thinning of the walls of the vagina. This can make sex painful.  Dryness of the skin and development of wrinkles.  Headaches.  Tiredness.  Irritability.  Memory problems.  Weight gain.  Bladder infections.  Hair growth on the face and chest.  Inability to reproduce offspring (infertility).  Loss of density in the bones (osteoporosis) increasing your risk for breaks (fractures).  Depression.  Hardening and narrowing of the arteries (atherosclerosis). This increases your risk of heart attack and stroke.  What treatment options are available? There are many treatment choices for menopause symptoms. The most common treatment is hormone replacement therapy. Many alternative therapies for menopause are emerging, including the use of herbal products. These supplements can be found in the form of herbs, teas, oils, tinctures, and pills. Common herbal supplements for menopause are made from plants that contain phytoestrogens. Phytoestrogens are compounds that occur naturally in plants and plant products. They act like estrogen in the body. Foods and herbs that contain phytoestrogens include:  Soy.  Flax seeds.  Red clover.  Ginseng.  What menopause symptoms may be helped if I use herbal products?  Vasomotor symptoms. These may be helped by: ? Soy. Some studies show that soy may have a moderate benefit for hot flashes. ? Black cohosh. There is limited evidence indicating this may be beneficial for hot flashes.  Symptoms that  are related to heart and blood vessel disease. These may be helped by soy. Studies have shown that soy can help to lower cholesterol.  Depression. This may be helped by: ? St. John's wort. There is limited evidence that shows this may help mild to moderate depression. ? Black cohosh. There is evidence that this may help depression and mood swings.  Osteoporosis. Soy may help to decrease bone loss that is associated with menopause and may prevent osteoporosis. Limited evidence indicates that red clover may offer some bone loss protection as well. Other herbal products that are commonly used during menopause lack enough evidence to support their use as a replacement for conventional menopause therapies. These products include evening primrose, ginseng, and red clover. What are the cases when herbal products should not be used during menopause? Do not use herbal products during menopause without your health care provider's approval if:  You are taking medicine.  You have a preexisting liver condition.  Are there any risks in my taking herbal products during menopause? If you choose to use herbal products to help with symptoms of menopause,  keep in mind that:  Different supplements have different and unmeasured amounts of herbal ingredients.  Herbal products are not regulated the same way that medicines are.  Concentrations of herbs may vary depending on the way they are prepared. For example, the concentration may be different in a pill, tea, oil, and tincture.  [place] is known about the risks of using herbal products, particularly the risks of long-term use.  Some herbal supplements can be harmful when combined with certain medicines.  Most commonly reported side effects of herbal products are mild. However, if used improperly, many herbal supplements can cause serious problems. Talk to your health care provider before starting any herbal product. If problems develop, stop taking the  supplement and let your health care provider know. This information is not intended to replace advice given to you by your health care provider. Make sure you discuss any questions you have with your health care provider. Document Released: 02/05/2008 Document Revised: 07/16/2016 Document Reviewed: 02/01/2014 Elsevier Interactive Patient Education  2017 [employer].

## 2018-03-24 LAB — CYTOLOGY - PAP
DIAGNOSIS: NEGATIVE
HPV: NOT DETECTED

## 2018-03-26 ENCOUNTER — Encounter: Payer: Self-pay | Admitting: Obstetrics and Gynecology

## 2018-04-14 DIAGNOSIS — H5789 Other specified disorders of eye and adnexa: Secondary | ICD-10-CM | POA: Diagnosis not present

## 2018-06-19 DIAGNOSIS — I10 Essential (primary) hypertension: Secondary | ICD-10-CM | POA: Diagnosis not present

## 2018-08-29 IMAGING — CR DG FINGER INDEX 2+V*L*
3 series · 3 of 3 positions shown · non-contrast
Comparison: None.

CLINICAL DATA: Caught finger in clothing with pain, initial
encounter

EXAM:
LEFT INDEX FINGER 2+V

[finger ap]
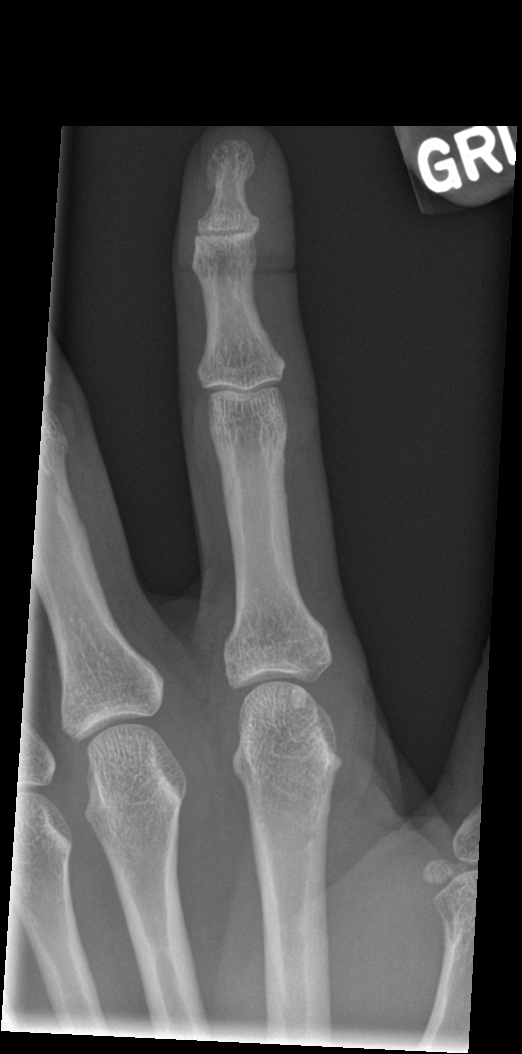

[finger obl]
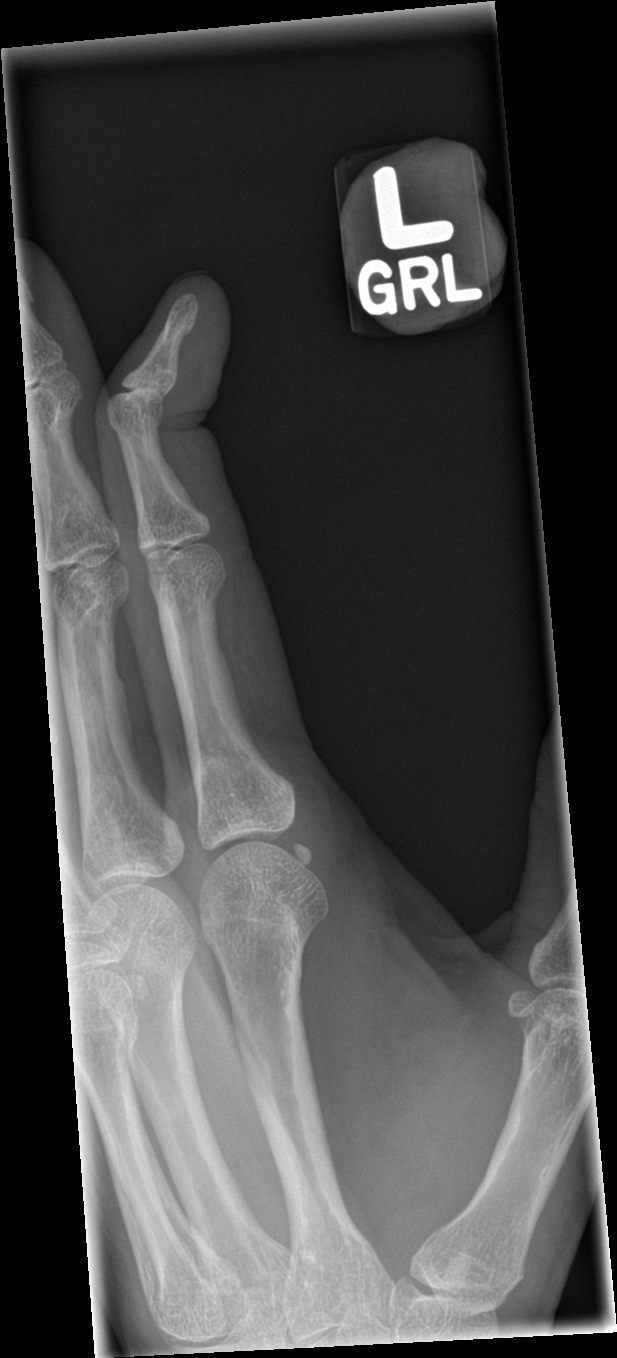

[finger lat]
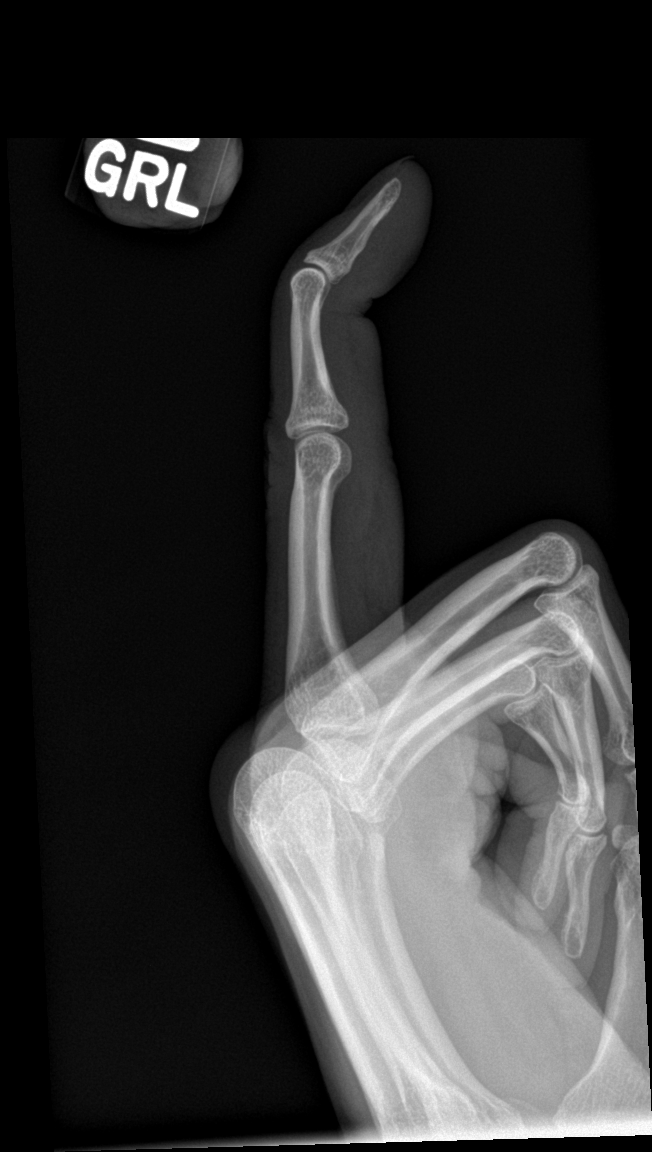

[3 of 3 positions shown; findings below may reference images not displayed]

FINDINGS: Flexion of the DIP joint is noted which may be related to
ligamentous injury. No acute fracture is seen. No dislocation is
noted.
IMPRESSION: Flexion at the DIP joint. Clinical correlation is recommended
regarding chronicity. This may be related to ligamentous injury.

## 2018-11-10 ENCOUNTER — Encounter: Payer: Self-pay | Admitting: Obstetrics and Gynecology

## 2019-04-16 ENCOUNTER — Ambulatory Visit: Payer: Commercial Managed Care - PPO | Admitting: Obstetrics and Gynecology

## 2019-05-19 NOTE — Progress Notes (Signed)
63 y.o. G0P0000 Married Caucasian female here for annual exam.    No vaginal bleeding.   Some urinary urgency.  No dysuria.   Off Metoprolol and Wellbutrin.  Now down to 150 mg of Zoloft.   Back to the office after being at home for the pandemic.   Labs with PCP next week.   PCP: Harlan Stains, MD    Patient's last menstrual period was 09/03/2011.           Sexually active: Yes.    The current method of family planning is post menopausal status.    Exercising: Yes.    walking Smoker:  no  Health Maintenance: Pap: 03-20-18 Neg:Neg HR HPV,02-21-15 Neg:Neg HR HPV History of abnormal Pap:  Yes, 2013 had colposcopy with Dr. Felicita Gage neg. No treatment to cervix MMG: January 2020 -- done at [facility]  Colonoscopy: 02-02-18, 2 adenomas, due again 2024 BMD:  10-23-16  Result :Osteopenia TDaP:  07-18-11 Gardasil:   n/a HIV:10-15-17 NR Hep C:donates blood Screening Labs:  PCP   reports that she has never smoked. She has never used smokeless tobacco. She reports current alcohol use of about 3.0 standard drinks of alcohol per week. She reports that she does not use drugs.  Past Medical History:  Diagnosis Date  . Allergic rhinitis   . Allergy   . Arthritis of shoulder   . Cataracts, bilateral    removed with lens implants   . Chest pressure   . Chondromalacia of patella   . Depression   . Diverticulitis   . Glaucoma    BORDERLINE IN LEFT EYE  . Heel spur   . HSV infection    TYPE 1 IN HER NOSE,LIP  . Hx of adenomatous colonic polyps 02/08/2018  . Hypertension   . Infertility, female   . Kidney stones 1/11   spontaneously passed dx'd based on blood test & history of cancer  . Mallet finger    LEFT INDEX FINGER  . Osteoarthritis, knee   . Osteoporosis   . Plantar fasciitis   . Plantar fasciitis of left foot   . Polycythemia   . Situational stress   . Stenosis of cervix   . Tennis elbow    -right    Past Surgical History:  Procedure Laterality Date  . ANAL  SPHINCTEROPLASTY  2004   hemorrhoids  . CATARACT EXTRACTION, BILATERAL  2013  . COLONOSCOPY  2009   Gessner- Tics only   . DIAGNOSTIC LAPAROSCOPY  1996  . FINGER SURGERY    . HEEL SPUR SURGERY Right 2015   Dr. Durward Fortes  . LEFT HEART CATH AND CORONARY ANGIOGRAPHY N/A 11/03/2017   Procedure: LEFT HEART CATH AND CORONARY ANGIOGRAPHY;  Surgeon: Jettie Booze, MD;  Location: [facility];  Service: Cardiovascular;  Laterality: N/A;  . SEPTOPLASTY  1970's   x2  . TONSILLECTOMY      Current Outpatient Medications  Medication Sig Dispense Refill  . amLODipine (NORVASC) 2.5 MG tablet TK 1 T PO QD  1  . Calcium Carb-Cholecalciferol (CALCIUM 600 + D) 600-200 MG-UNIT TABS Take 1 tablet by mouth daily.    . fluocinonide cream (LIDEX) 0.05 % APP EXT AA BID    . losartan (COZAAR) 100 MG tablet Take 100 mg by mouth daily.    . sertraline (ZOLOFT) 100 MG tablet Take 150 mg by mouth 2 (two) times daily.     . valACYclovir (VALTREX) 1000 MG tablet Take 2,000 mg by mouth 2 (two) times daily  as needed (for fever blisters).     . zolpidem (AMBIEN) 5 MG tablet Take 1 tablet by mouth at bedtime.  0  . meclizine (ANTIVERT) 12.5 MG tablet Take 1 tablet (12.5 mg total) by mouth 3 (three) times daily as needed for dizziness or nausea. (Patient not taking: Reported on 05/21/2019) 30 tablet 1  . meclizine (ANTIVERT) 25 MG tablet Take 1 tablet (25 mg total) by mouth 3 (three) times daily as needed for dizziness. (Patient not taking: Reported on 02/02/2018) 30 tablet 0   No current facility-administered medications for this visit.     Family History  Problem Relation Age of Onset  . Osteoporosis Mother   . Heart attack Mother   . Alzheimer's disease Mother   . Diabetes Father   . Hypertension Father   . Heart failure Father   . Ovarian cancer Sister   . Stroke Maternal Grandmother   . Colon cancer Neg Hx   . Colon polyps Neg Hx   . Esophageal cancer Neg Hx   . Rectal cancer Neg Hx   . Stomach  cancer Neg Hx     Review of Systems  Constitutional: Negative.   HENT: Negative.   Eyes: Negative.   Respiratory: Negative.   Cardiovascular: Negative.   Gastrointestinal: Negative.   Endocrine: Negative.   Genitourinary: Negative.   Musculoskeletal: Negative.   Skin: Negative.   Allergic/Immunologic: Negative.   Neurological: Negative.   Hematological: Negative.   Psychiatric/Behavioral: Negative.     Exam:   BP 118/62 (BP Location: Right Arm, Patient Position: Sitting, Cuff Size: Large)   Pulse 68   Temp 98.1 F (36.7 C) (Oral)   Resp 12   Ht 5' 6.75" (1.695 m)   Wt 182 lb (82.6 kg)   LMP 09/03/2011   BMI 28.72 kg/m     General appearance: alert, cooperative and appears stated age Head: normocephalic, without obvious abnormality, atraumatic Neck: no adenopathy, supple, symmetrical, trachea midline and thyroid normal to inspection and palpation Lungs: clear to auscultation bilaterally Breasts: normal appearance, no masses or tenderness, No nipple retraction or dimpling, No nipple discharge or bleeding, No axillary adenopathy Heart: regular rate and rhythm Abdomen: soft, non-tender; no masses, no organomegaly Extremities: extremities normal, atraumatic, no cyanosis or edema Skin: skin color, texture, turgor normal. No rashes or lesions Lymph nodes: cervical, supraclavicular, and axillary nodes normal. Neurologic: grossly normal  Pelvic: External genitalia:  no lesions              No abnormal inguinal nodes palpated.              Urethra:  normal appearing urethra with no masses, tenderness or lesions              Bartholins and Skenes: normal                 Vagina: normal appearing vagina with normal color and discharge, no lesions.  Discomfort with speculum exam.              Cervix: no lesions              Pap taken: No. Bimanual Exam:  Uterus:  normal size, contour, position, consistency, mobility, non-tender              Adnexa: no mass, fullness, tenderness               Rectal exam: Yes.  .  Confirms.  Anus:  normal sphincter tone, no lesions  Chaperone was present for exam.  Assessment:   Well woman visit with normal exam. Osteopenia.  PCP following.  HSV 1. Stress incontinence - mild.  Depression/anxiety. HTN.  Plan: Mammogram screening discussed.  Will get copy of this.  Self breast awareness reviewed. Pap and HR HPV as above. Guidelines for Calcium, Vitamin D, regular exercise program including cardiovascular and weight bearing exercise. Flu vaccine recommended.  Rx for Vagifem.  Instructed in use.  I discussed possible effect on breast cancer.  Will get copy of BMD. Follow up annually and prn.   After visit summary provided.

## 2019-05-20 ENCOUNTER — Ambulatory Visit: Payer: Commercial Managed Care - PPO | Admitting: Obstetrics and Gynecology

## 2019-05-21 ENCOUNTER — Ambulatory Visit (INDEPENDENT_AMBULATORY_CARE_PROVIDER_SITE_OTHER): Payer: 59 | Admitting: Obstetrics and Gynecology

## 2019-05-21 ENCOUNTER — Other Ambulatory Visit: Payer: Self-pay

## 2019-05-21 ENCOUNTER — Encounter: Payer: Self-pay | Admitting: Obstetrics and Gynecology

## 2019-05-21 VITALS — BP 118/62 | HR 68 | Temp 98.1°F | Resp 12 | Ht 66.75 in | Wt 182.0 lb

## 2019-05-21 DIAGNOSIS — Z01419 Encounter for gynecological examination (general) (routine) without abnormal findings: Secondary | ICD-10-CM | POA: Diagnosis not present

## 2019-05-21 MED ORDER — ESTRADIOL 10 MCG VA TABS: 1.0000 | ORAL_TABLET | VAGINAL | 3 refills | Status: DC

## 2019-10-08 ENCOUNTER — Ambulatory Visit: Payer: 59 | Attending: Otolaryngology

## 2019-10-08 ENCOUNTER — Other Ambulatory Visit: Payer: Self-pay

## 2019-10-08 DIAGNOSIS — R42 Dizziness and giddiness: Secondary | ICD-10-CM | POA: Insufficient documentation

## 2019-10-08 NOTE — Therapy (Addendum)
[place] MAIN [facility] SERVICES [place], [place], [address] Phone: [phone]   Fax:  [phone]  Physical Therapy Evaluation  Patient Details  Name: Danielle Knapp MRN: [id number] Date of Birth: [birth year] Referring Provider (PT): Dr. Steele Sizer   Encounter Date: 10/08/2019  PT End of Session - 10/08/19 1147    Visit Number  1    Number of Visits  13    Date for PT Re-Evaluation  12/31/19    Authorization Type  eval 10/08/19    PT Start Time  1030    PT Stop Time  1150    PT Time Calculation (min)  80 min    Equipment Utilized During Treatment  Gait belt    Activity Tolerance  Patient tolerated treatment well    Behavior During Therapy  [facility] for tasks assessed/performed       Past Medical History:  Diagnosis Date  . Allergic rhinitis   . Allergy   . Arthritis of shoulder   . Cataracts, bilateral    removed with lens implants   . Chest pressure   . Chondromalacia of patella   . Depression   . Diverticulitis   . Glaucoma    BORDERLINE IN LEFT EYE  . Heel spur   . HSV infection    TYPE 1 IN HER NOSE,LIP  . Hx of adenomatous colonic polyps 02/08/2018  . Hypertension   . Infertility, female   . Kidney stones 1/11   spontaneously passed dx'd based on blood test & history of cancer  . Mallet finger    LEFT INDEX FINGER  . Osteoarthritis, knee   . Osteoporosis   . Plantar fasciitis   . Plantar fasciitis of left foot   . Polycythemia   . Situational stress   . Stenosis of cervix   . Tennis elbow    -right    Past Surgical History:  Procedure Laterality Date  . ANAL SPHINCTEROPLASTY  2004   hemorrhoids  . CATARACT EXTRACTION, BILATERAL  2013  . COLONOSCOPY  2009   Gessner- Tics only   . DIAGNOSTIC LAPAROSCOPY  1996  . FINGER SURGERY    . HEEL SPUR SURGERY Right 2015   Dr. Durward Fortes  . LEFT HEART CATH AND CORONARY ANGIOGRAPHY N/A 11/03/2017   Procedure: LEFT HEART CATH AND CORONARY ANGIOGRAPHY;  Surgeon:  Jettie Booze, MD;  Location: [place] [facility];  Service: Cardiovascular;  Laterality: N/A;  . SEPTOPLASTY  1970's   x2  . TONSILLECTOMY      There were no vitals filed for this visit.   Subjective Assessment - 10/08/19 1028    Subjective  Vertigo    Pertinent History  Pt reports that she was treated a couple years ago for BPPV. Symptoms resolved but a couple months ago she started having vertigo again. She was seen at [facility] ENT and reports she was treated on three separate visits over the last 3 weeks with the Epley maneuver with some improvement but not complete resolution. She was told that her R side was the affected side. The last episode she had was this past Tuesday evening. She was last treated on Wednesday at [facility] with the Epley maneuver and she has not had any episodes since that time. She has a VNG study scheduled for next Wednesday 10/13/19. She also reports some general unsteadiness/imbalance which has been going on since prior to the start of her vertigo.    Patient Stated Goals  Resolve vertigo and improve balance    Currently in Pain?  No/denies   Unrelated to current episode        [facility] PT Assessment - 10/08/19 1144      Assessment   Medical Diagnosis  Dizziness, BPPV    Referring Provider (PT)  Dr. Steele Sizer    Onset Date/Surgical Date  08/03/19   Approximate   Hand Dominance  Right    Next MD Visit  10/13/19    Prior Therapy  Yes, CRT at [facility]NT      Precautions   Precautions  Fall      Restrictions   Weight Bearing Restrictions  No      Balance Screen   Has the patient fallen in the past 6 months  Yes    How many times?  1    Has the patient had a decrease in activity level because of a fear of falling?   Yes    Is the patient reluctant to leave their home because of a fear of falling?   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 [employer], traveling, puzzles, working in the yard      Cognition   Overall Cognitive Status  Within Functional Limits for tasks assessed      [place] to [place] to stand without using hands and stabilize independently    Standing Unsupported  Able to stand safely 2 minutes    Sitting with Back Unsupported but Feet Supported on Floor or Stool  Able to sit safely and securely 2 minutes    Stand to Sit  Sits safely with minimal use of hands    Transfers  Able to transfer safely, minor use of hands    Standing Unsupported with Eyes Closed  Able to stand 10 seconds safely    Standing Unsupported with Feet Together  Able to place feet together independently and stand 1 minute safely    From Standing, Reach Forward with Outstretched Arm  Can reach confidently >25 cm (10")    From Standing Position, Pick up Object from Floor  Able to pick up shoe safely and easily    From Standing Position, Turn to Look Behind Over each Shoulder  Looks behind from both sides and weight shifts well    Turn 360 Degrees  Able to turn 360 degrees safely in 4 seconds or less    Standing Unsupported, Alternately Place Feet on Step/Stool  Able to stand independently and safely and complete 8 steps in 20 seconds    Standing Unsupported, One Foot in [place] to  plae foot ahead of the other independently and hold 30 seconds    Standing  on One Leg  Able to lift leg independently and hold 5-10 seconds    Total Score  54      Functional Gait  Assessment   Gait assessed   Yes    Gait Level Surface  Walks 20 ft in less than 5.5 sec, no assistive devices, good speed, no evidence for imbalance, normal gait pattern, deviates no more than 6 in outside of the 12 in walkway width.    Change in Gait Speed  Able to smoothly change walking speed without loss of balance or gait deviation. Deviate no more than 6 in outside of the 12 in walkway width.    Gait with Horizontal Head Turns  Performs head turns smoothly with slight change in gait velocity (eg, minor disruption to smooth gait path), deviates 6-10 in outside 12 in walkway width, or uses an assistive device.    Gait with Vertical Head Turns  Performs head turns with no change in gait. Deviates no more than 6 in outside 12 in walkway width.    Gait and Pivot Turn  Pivot turns safely within 3 sec and stops quickly with no loss of balance.    Step Over Obstacle  Is able to step over 2 stacked shoe boxes taped together (9 in total height) without changing gait speed. No evidence of imbalance.    Gait with Narrow Base of Support  Ambulates 4-7 steps.    Gait with Eyes Closed  Walks 20 ft, slow speed, abnormal gait pattern, evidence for imbalance, deviates 10-15 in outside 12 in walkway width. Requires more than 9 sec to ambulate 20 ft.    Ambulating Backwards  Walks 20 ft, uses assistive device, slower speed, mild gait deviations, deviates 6-10 in outside 12 in walkway width.    Steps  Alternating feet, no rail.    Total Score  24         VESTIBULAR AND BALANCE EVALUATION   HISTORY:  Subjective history of current problem: Pt reports that she was treated a couple years ago for BPPV. Symptoms resolved but a couple months ago she started having vertigo again. She was seen at [facility] and reports she was treated on three separate visits over the last 3 weeks with the Epley maneuver with  some improvement but not complete resolution. She was told that her R side was the affected side. The last episode she had was this past Tuesday evening. She was last treated on Wednesday at [facility]NT with the Epley maneuver and she has not had any episodes since that time. She has a VNG study scheduled for next Wednesday 10/13/19. She also reports some general unsteadiness/imbalance which has been going on since prior to the start of her vertigo. Description of dizziness: (vertigo, unsteadiness, lightheadedness, falling, general unsteadiness, whoozy, swimmy-headed sensation, aural fullness) Vertigo, pt also has a separate issue with her balance Frequency: "At least every other day" Duration: less than 1 minute Symptom nature: (motion provoked, positional, spontaneous, constant, variable, intermittent) Always motion provoked  Provocative Factors: Laying down  Easing Factors: Waiting for symptoms to pass or avoiding aggravating positions, moving slower;  Progression of symptoms: (better, worse, no change since onset) better History of similar episodes: A couple years  Falls (yes/no): Last November she felt backwards, no head trauma Number of falls in past 6 months: 1  Prior Functional Level: Fully independent, working, driving  Auditory complaints (tinnitus,  pain, drainage, hearing loss, aural fullness): Intermittent aural fullness Vision (diplopia, visual field loss, recent changes, last eye exam): None, wears corrective lenses, goes yearly for eye exam  Red Flags: (dysarthria, dysphagia, drop attacks, bowel and bladder changes, recent weight loss/gain) Review of systems negative for red flags.     EXAMINATION  POSTURE:   NEUROLOGICAL SCREEN: (2+ unless otherwise noted.) N=normal  Ab=abnormal  Level Dermatome R L Myotome R L Reflex R L  C3 Anterior Neck N N Sidebend C2-3 N N Jaw CN V    C4 Top of Shoulder N N Shoulder Shrug C4 N N Hoffman's UMN    C5 Lateral Upper Arm N N Shoulder  ABD C4-5 N N Biceps C5-6    C6 Lateral Arm/ Thumb N N Arm Flex/ Wrist Ext C5-6 N N Brachiorad. C5-6    C7 Middle Finger N N Arm Ext//Wrist Flex C6-7 N N Triceps C7    C8 4th & 5th Finger N N Flex/ Ext Carpi Ulnaris C8 N N Patellar (L3-4)    T1 Medial Arm N N Interossei T1 N N Gastrocnemius    L2 Medial thigh/groin N N Illiopsoas (L2-3) N N     L3 Lower thigh/med.knee N N Quadriceps (L3-4) N N     L4 Medial leg/lat thigh N N Tibialis Ant (L4-5) N N     L5 Lat. leg & dorsal foot N N EHL (L5) N N     S1 post/lat foot/thigh/leg N N Gastrocnemius (S1-2) N N     S2 Post./med. thigh & leg N N Hamstrings (L4-S3) N N       Cranial Nerves Extraocular muscles are intact  Facial sensation is intact bilaterally  Facial strength is intact bilaterally  Hearing is normal as tested by gross conversation Palate elevates midline, normal phonation  Shoulder shrug strength is intact  Tongue protrudes midline    SOMATOSENSORY:         Sensation           Intact      Diminished         Absent  Light touch Normal       COORDINATION: Finger to Nose: Normal Heel to Shin: Normal Pronator Drift: Negative Rapid Alternating Movements: Normal Finger to Thumb Opposition: Normal   MUSCULOSKELETAL SCREEN: Cervical Spine ROM: WFL and painless in all planes. No gross deficits identified   ROM: WNL  MMT: WNL  Functional Mobility: Independent for transfers and ambulation without assistive device   Gait: Scanning of visual environment with gait is: WNL   POSTURAL CONTROL TESTS:   Clinical Test of Sensory Interaction for Balance    (CTSIB):  CONDITION TIME STRATEGY SWAY  Eyes open, firm surface 30 seconds ankle 1+  Eyes closed, firm surface 30 seconds ankle 2+  Eyes open, foam surface 30 seconds ankle 2+  Eyes closed, foam surface 2-3 seconds ankle 4+    OCULOMOTOR / VESTIBULAR TESTING:  Oculomotor Exam- Room Light  Findings Comments  Ocular Alignment normal Appears normal but R eye looks  like it might be slightly directed medially at rest  Ocular ROM normal   Spontaneous Nystagmus normal   Gaze-Holding Nystagmus normal Possibly mid range L horizontal beats but difficult to determine  End-Gaze Nystagmus normal Some end range beats noted mostly with L gaze  Vergence (normal 2-3") not examined   Smooth Pursuit normal Mostly normal, possibly mildly saccadic  Cross-Cover Test normal   Saccades abnormal Consistently requiring one corrective saccade horizontally and  vertically  VOR Cancellation normal   Left Head Impulse normal   Right Head Impulse normal   Static Acuity not examined   Dynamic Acuity not examined     Oculomotor Exam- Fixation Suppressed  Findings Comments  Ocular Alignment normal   Spontaneous Nystagmus abnormal A few L horizontal beats of nystagmus with central gaze  Gaze-Holding Nystagmus abnormal L horizontal beating nystagmus with L gaze  End-Gaze Nystagmus abnormal See above  Head Shaking Nystagmus normal   Pressure-Induced Nystagmus not examined   Hyperventilation Induced Nystagmus not examined   Skull Vibration Induced Nystagmus abnormal R horizontal beating nystagmus with vibration on R mastoid    BPPV TESTS:  Symptoms Duration Intensity Nystagmus  L Dix-Hallpike Mild vertigo   Possibly a few downbeats  R Dix-Hallpike None   None  L Head Roll "It feels like it might be doing something"   A few apogeotrophic beats but very faint and resolves quickly  R Head Roll None   None  L Sidelying Test None   None  R Sidelying Test None   None    Repeated Dix-Hallpike testing with IR goggles without significant nystagmus observed or vertigo reported;   FUNCTIONAL OUTCOME MEASURES   Results Comments  BERG 54/56 Fall risk, in need of intervention  FGA 24/30 Higher level balance deficits  ABC Scale 67.5% Slightly above cut-off  DHI 20/100 Mild handicap related to dizziness.         Canalith Repositioning Treatment Pt treated with 1 bout of  Epley Maneuver for presumed R posterior canal BPPV based on ENT evaluation and successful treatment. Despite negative tests today intervention performed due to previous successful treatments. 2 minute holds in each position with no additional vertigo reported or nystagmus observed during positioning.                    Objective measurements completed on examination: See above findings.              PT Education - 10/08/19 1147    Education Details  Plan of care    Person(s) Educated  Patient    Methods  Explanation    Comprehension  Verbalized understanding       PT Short Term Goals - 10/08/19 1608      PT SHORT TERM GOAL #1   Title  Pt will be independent with HEP in order to improve balance and dizziness to decrease fall risk and improve function at home and work.    Time  6    Period  Weeks    Status  New    Target Date  11/19/19        PT Long Term Goals - 10/08/19 1609      PT LONG TERM GOAL #1   Title  Pt will improve FGA by at least 4 points in order to demonstrate clinically significant improvement in balance and decreased risk for falls.    Baseline  10/08/19: 24/30    Time  12    Period  Weeks    Status  New    Target Date  12/31/19      PT LONG TERM GOAL #2   Title  Pt will improve ABC by at least 13% in order to demonstrate clinically significant improvement in balance confidence.    Baseline  10/08/19: 67.5%    Time  12    Period  Weeks    Status  New    Target Date  12/31/19  PT LONG TERM GOAL #3   Title  Pt will improve her single leg stance to greater than 10s to demonstrate improved balance and decrease risk for falls    Baseline  10/08/19: approximately 5-7s bilaterally    Time  12    Period  Weeks    Status  New    Target Date  12/31/19             Plan - 10/08/19 1148    Clinical Impression Statement  Pt is a pleasant 64 year-old female referred for dizziness and vertigo. She was being treated as late as this week  for BPPV. Per pt her last episode of vertigo was Tuesday night and she had another treatment on Wednesday. She has had no further episodes of vertigo and all of her canal testing is negative on this date including Dix-Hallpike with and without fixation suppression as well as sidelying and roll tests. She also reports some general imbalance. Bedside vestibular testing reveals a mixture of central and peripheral findings. Her smooth pursuit is possibly mildly saccadic and her horizontal and vertical saccade testing is mildly hypometric in all directions. She does appear to have a slight L horizontal beating nystagmus with L directed gaze in room light and appears more prominent with fixation suppression. She also appears to have some horizontal L beats at central gaze as well with fixation suppression. No post-headshake nystagmus but she does have some R horizontal beating nystagmus with skull vibration on her mastoid. She has a VNG study scheduled for 10/13/19 on based on her examination appears to possibly have a mild hypofunction. She presents with some high level balance deficits scoring a 24/30 on the FGA. She will return next week for repeat canal testing and if it remains negative to initiate some balance and vestibular therapy.    Personal Factors and Comorbidities  Age;Comorbidity 1    Comorbidities  depression    Examination-Activity Limitations  Bathing    Examination-Participation Restrictions  Cleaning;Yard Work    Stability/Clinical Decision Making  Stable/Uncomplicated    Designer, jewellery  Low    Rehab Potential  Excellent    PT Frequency  1x / week    PT Duration  12 weeks    PT Treatment/Interventions  ADLs/Self Care Home Management;Aquatic Therapy;Canalith Repostioning;Cryotherapy;Electrical Stimulation;Iontophoresis 4mg /ml Dexamethasone;Moist Heat;Traction;Ultrasound;Contrast Bath;DME Instruction;Gait training;Stair training;Functional mobility training;Therapeutic  activities;Therapeutic exercise;Balance training;Neuromuscular re-education;Patient/family education;Manual techniques;Vestibular;Passive range of motion;Dry needling;Spinal Manipulations;Joint Manipulations    PT Next Visit Plan  Repeat canal testing, treat with canalith repositioning treatment if needed, initiate adaptation, habituation, and balance training    PT Home Exercise Plan  None currently    Consulted and Agree with Plan of Care  Patient       Patient will benefit from skilled therapeutic intervention in order to improve the following deficits and impairments:  Dizziness, Decreased balance  Visit Diagnosis: Dizziness and giddiness     Problem List Patient Active Problem List   Diagnosis Date Noted  . Hx of adenomatous colonic polyps 02/08/2018  . Chest pain 10/15/2017  . Hypertensive urgency 10/15/2017  . Depression   . Hypertension   . Polycythemia 08/11/2012   Phillips Grout PT, DPT, GCS  Christy Friede 10/08/2019, 4:20 PM  [place] MAIN [facility] SERVICES [address] [place], [place], [address] Phone: [phone]   Fax:  [phone]  Name: VERMEL CAJINA MRN: [id number] Date of Birth: [birth year]

## 2019-10-14 ENCOUNTER — Other Ambulatory Visit: Payer: Self-pay

## 2019-10-14 ENCOUNTER — Ambulatory Visit: Payer: 59

## 2019-10-14 DIAGNOSIS — R42 Dizziness and giddiness: Secondary | ICD-10-CM

## 2019-10-14 NOTE — Therapy (Signed)
[place] MAIN [facility] SERVICES [address] [place], [place], [address] Phone: [phone]   Fax:  [phone]  Physical Therapy Treatment  Patient Details  Name: Danielle Knapp MRN: [id number] Date of Birth: 20-May-1956 Referring Provider (PT): Dr. Steele Sizer   Encounter Date: 10/14/2019  PT End of Session - 10/14/19 1036    Visit Number  2    Number of Visits  13    Date for PT Re-Evaluation  12/31/19    Authorization Type  eval 10/08/19    PT Start Time  0940    PT Stop Time  1015    PT Time Calculation (min)  35 min    Equipment Utilized During Treatment  Gait belt    Activity Tolerance  Patient tolerated treatment well    Behavior During Therapy  [facility] for tasks assessed/performed       Past Medical History:  Diagnosis Date  . Allergic rhinitis   . Allergy   . Arthritis of shoulder   . Cataracts, bilateral    removed with lens implants   . Chest pressure   . Chondromalacia of patella   . Depression   . Diverticulitis   . Glaucoma    BORDERLINE IN LEFT EYE  . Heel spur   . HSV infection    TYPE 1 IN HER NOSE,LIP  . Hx of adenomatous colonic polyps 02/08/2018  . Hypertension   . Infertility, female   . Kidney stones 1/11   spontaneously passed dx'd based on blood test & history of cancer  . Mallet finger    LEFT INDEX FINGER  . Osteoarthritis, knee   . Osteoporosis   . Plantar fasciitis   . Plantar fasciitis of left foot   . Polycythemia   . Situational stress   . Stenosis of cervix   . Tennis elbow    -right    Past Surgical History:  Procedure Laterality Date  . ANAL SPHINCTEROPLASTY  2004   hemorrhoids  . CATARACT EXTRACTION, BILATERAL  2013  . COLONOSCOPY  2009   Gessner- Tics only   . DIAGNOSTIC LAPAROSCOPY  1996  . FINGER SURGERY    . HEEL SPUR SURGERY Right 2015   Dr. Durward Fortes  . LEFT HEART CATH AND CORONARY ANGIOGRAPHY N/A 11/03/2017   Procedure: LEFT HEART CATH AND CORONARY ANGIOGRAPHY;  Surgeon:  Jettie Booze, MD;  Location: [place] [facility];  Service: Cardiovascular;  Laterality: N/A;  . SEPTOPLASTY  1970's   x2  . TONSILLECTOMY      There were no vitals filed for this visit.  Subjective Assessment - 10/14/19 0943    Subjective  Pt reports that she is doing well today but has had multiple severe vertigo episodes since the last visit, mostly since Sunday. She had her VNG study yesterday and reports that they were able to trigger her vertigo on the L side as well. Otherwise no specific questions at this time.    Pertinent History  Pt reports that she was treated a couple years ago for BPPV. Symptoms resolved but a couple months ago she started having vertigo again. She was seen at [facility] and reports she was treated on three separate visits over the last 3 weeks with the Epley maneuver with some improvement but not complete resolution. She was told that her R side was the affected side. The last episode she had was this past Tuesday evening. She was last treated on Wednesday at [facility] with  the Epley maneuver and she has not had any episodes since that time. She has a VNG study scheduled for next Wednesday 10/13/19. She also reports some general unsteadiness/imbalance which has been going on since prior to the start of her vertigo.    Patient Stated Goals  Resolve vertigo and improve balance    Currently in Pain?  No/denies         TREATMENT   Canalith Repositioning Treatment Retested Dix-Hallpike bilaterally. Very faint positive with faint upbeating L torsional nystagmus of short duration and only faint sensation of vertigo reported by patient. Right side is positive but almost a 30s latency before she starts with relatively vigorous upbeating R torsional nystagmus which lasts approximately 10-15s. Pt reports 4/10 vertigo. Pt treated with 3 bouts of Epley Maneuver for presumed R posterior canal BPPV. Two minute holds in each position and retesting between maneuvers.  During first re-test pt presents with upbeating R torsional nystagmus which only lasts 5-10 seconds and is much less vigorous. She reports 1/10 vertigo. During final position of second Epley maneuver pt has very severe reversal of nystagmus and vertigo when she sits up. Re-testing after second bout of Epley is negative for both nystagmus and vertigo. Completed third bout of Epley with patient and then issued home Epley maneuver for R side with handout and explanations.                           PT Short Term Goals - 10/08/19 1608      PT SHORT TERM GOAL #1   Title  Pt will be independent with HEP in order to improve balance and dizziness to decrease fall risk and improve function at home and work.    Time  6    Period  Weeks    Status  New    Target Date  11/19/19        PT Long Term Goals - 10/08/19 1609      PT LONG TERM GOAL #1   Title  Pt will improve FGA by at least 4 points in order to demonstrate clinically significant improvement in balance and decreased risk for falls.    Baseline  10/08/19: 24/30    Time  12    Period  Weeks    Status  New    Target Date  12/31/19      PT LONG TERM GOAL #2   Title  Pt will improve ABC by at least 13% in order to demonstrate clinically significant improvement in balance confidence.    Baseline  10/08/19: 67.5%    Time  12    Period  Weeks    Status  New    Target Date  12/31/19      PT LONG TERM GOAL #3   Title  Pt will improve her single leg stance to greater than 10s to demonstrate improved balance and decrease risk for falls    Baseline  10/08/19: approximately 5-7s bilaterally    Time  12    Period  Weeks    Status  New    Target Date  12/31/19            Plan - 10/14/19 1036    Clinical Impression Statement  Retested Dix-Hallpike bilaterally. Very faint positive with faint upbeating L torsional nystagmus of short duration and only faint sensation of vertigo reported by patient. Right side is positive but  almost a 30s latency before she starts with  relatively vigorous upbeating R torsional nystagmus which lasts approximately 10-15s. Pt reports 4/10 vertigo. Pt treated with 3 bouts of Epley Maneuver for presumed R posterior canal BPPV. Two minute holds in each position and retesting between maneuvers. During first re-test pt presents with upbeating R torsional nystagmus which only lasts 5-10 seconds and is much less vigorous. She reports 1/10 vertigo. During final position of second Epley maneuver pt has very severe reversal of nystagmus and vertigo when she sits up. Re-testing after second bout of Epley is negative for both nystagmus and vertigo. Completed third bout of Epley with patient and then issued home Epley maneuver for R side with handout and explanations. Pt encouraged to perform 3 bouts of the Epley maneuver once per day (all at the same time). When she returns will retest the R side and if it is negative will move on to test/treat the L side. Once all BPPV is successfully treated will reassess balance and treat if necessary. Pt will benefit from PT services to address deficits in strength, balance, and mobility in order to return to full function at home.    Personal Factors and Comorbidities  Age;Comorbidity 1    Comorbidities  depression    Examination-Activity Limitations  Bathing    Examination-Participation Restrictions  Cleaning;Yard Work    Stability/Clinical Decision Making  Stable/Uncomplicated    Rehab Potential  Excellent    PT Frequency  1x / week    PT Duration  12 weeks    PT Treatment/Interventions  ADLs/Self Care Home Management;Aquatic Therapy;Canalith Repostioning;Cryotherapy;Electrical Stimulation;Iontophoresis 4mg /ml Dexamethasone;Moist Heat;Traction;Ultrasound;Contrast Bath;DME Instruction;Gait training;Stair training;Functional mobility training;Therapeutic activities;Therapeutic exercise;Balance training;Neuromuscular re-education;Patient/family education;Manual  techniques;Vestibular;Passive range of motion;Dry needling;Spinal Manipulations;Joint Manipulations    PT Next Visit Plan  Repeat canal testing, treat with canalith repositioning treatment if needed, initiate adaptation, habituation, and balance training    PT Home Exercise Plan  None currently    Consulted and Agree with Plan of Care  Patient       Patient will benefit from skilled therapeutic intervention in order to improve the following deficits and impairments:  Dizziness, Decreased balance  Visit Diagnosis: Dizziness and giddiness     Problem List Patient Active Problem List   Diagnosis Date Noted  . Hx of adenomatous colonic polyps 02/08/2018  . Chest pain 10/15/2017  . Hypertensive urgency 10/15/2017  . Depression   . Hypertension   . Polycythemia 08/11/2012   Phillips Grout PT, DPT, GCS  Iviona Hole 10/14/2019, 10:42 AM  [place] MAIN [facility] SERVICES [address] [place], [place], [address] Phone: [phone]   Fax:  [phone]  Name: Danielle Knapp MRN: [id number] Date of Birth: August 29, 1956

## 2019-10-20 ENCOUNTER — Ambulatory Visit: Payer: 59

## 2019-10-20 ENCOUNTER — Telehealth: Payer: Self-pay | Admitting: Physician Assistant

## 2019-10-20 NOTE — Telephone Encounter (Signed)
Received a new hem referral from Dr. Dema Severin for elevated hgb. Danielle Knapp has been cld and scheduled to see [name] on 2/22 at 1:30pm w/labs at 1pm. Pt aware to arrive 15 minutes early.

## 2019-10-21 ENCOUNTER — Ambulatory Visit: Payer: 59

## 2019-10-22 ENCOUNTER — Other Ambulatory Visit: Payer: Self-pay | Admitting: Physician Assistant

## 2019-10-22 DIAGNOSIS — D751 Secondary polycythemia: Secondary | ICD-10-CM

## 2019-10-25 ENCOUNTER — Inpatient Hospital Stay: Payer: 59

## 2019-10-25 ENCOUNTER — Other Ambulatory Visit: Payer: Self-pay | Admitting: Physician Assistant

## 2019-10-25 ENCOUNTER — Encounter: Payer: Self-pay | Admitting: Physician Assistant

## 2019-10-25 ENCOUNTER — Inpatient Hospital Stay: Payer: 59 | Attending: Physician Assistant | Admitting: Physician Assistant

## 2019-10-25 ENCOUNTER — Other Ambulatory Visit: Payer: Self-pay

## 2019-10-25 VITALS — BP 149/71 | HR 68 | Temp 98.3°F | Resp 17 | Ht 66.75 in | Wt 189.7 lb

## 2019-10-25 DIAGNOSIS — F329 Major depressive disorder, single episode, unspecified: Secondary | ICD-10-CM | POA: Diagnosis not present

## 2019-10-25 DIAGNOSIS — D751 Secondary polycythemia: Secondary | ICD-10-CM

## 2019-10-25 DIAGNOSIS — Z8041 Family history of malignant neoplasm of ovary: Secondary | ICD-10-CM | POA: Insufficient documentation

## 2019-10-25 DIAGNOSIS — Z79899 Other long term (current) drug therapy: Secondary | ICD-10-CM | POA: Diagnosis not present

## 2019-10-25 DIAGNOSIS — Z86010 Personal history of colonic polyps: Secondary | ICD-10-CM | POA: Insufficient documentation

## 2019-10-25 DIAGNOSIS — M810 Age-related osteoporosis without current pathological fracture: Secondary | ICD-10-CM | POA: Diagnosis not present

## 2019-10-25 DIAGNOSIS — M1990 Unspecified osteoarthritis, unspecified site: Secondary | ICD-10-CM | POA: Insufficient documentation

## 2019-10-25 DIAGNOSIS — I10 Essential (primary) hypertension: Secondary | ICD-10-CM | POA: Diagnosis not present

## 2019-10-25 DIAGNOSIS — Z8249 Family history of ischemic heart disease and other diseases of the circulatory system: Secondary | ICD-10-CM | POA: Diagnosis not present

## 2019-10-25 DIAGNOSIS — H8110 Benign paroxysmal vertigo, unspecified ear: Secondary | ICD-10-CM | POA: Insufficient documentation

## 2019-10-25 LAB — CBC WITH DIFFERENTIAL (CANCER CENTER ONLY)
Abs Immature Granulocytes: 0.02 10*3/uL (ref 0.00–0.07)
Basophils Absolute: 0.1 10*3/uL (ref 0.0–0.1)
Basophils Relative: 1 %
Eosinophils Absolute: 0.1 10*3/uL (ref 0.0–0.5)
Eosinophils Relative: 1 %
HCT: 43.2 % (ref 36.0–46.0)
Hemoglobin: 15.9 g/dL — ABNORMAL HIGH (ref 12.0–15.0)
Immature Granulocytes: 0 %
Lymphocytes Relative: 29 %
Lymphs Abs: 2.2 10*3/uL (ref 0.7–4.0)
MCH: 32 pg (ref 26.0–34.0)
MCHC: 36.8 g/dL — ABNORMAL HIGH (ref 30.0–36.0)
MCV: 86.9 fL (ref 80.0–100.0)
Monocytes Absolute: 0.6 10*3/uL (ref 0.1–1.0)
Monocytes Relative: 8 %
Neutro Abs: 4.6 10*3/uL (ref 1.7–7.7)
Neutrophils Relative %: 61 %
Platelet Count: 273 10*3/uL (ref 150–400)
RBC: 4.97 MIL/uL (ref 3.87–5.11)
RDW: 13 % (ref 11.5–15.5)
WBC Count: 7.6 10*3/uL (ref 4.0–10.5)
nRBC: 0 % (ref 0.0–0.2)

## 2019-10-25 LAB — CMP (CANCER CENTER ONLY)
ALT: 25 U/L (ref 0–44)
AST: 18 U/L (ref 15–41)
Albumin: 4.3 g/dL (ref 3.5–5.0)
Alkaline Phosphatase: 67 U/L (ref 38–126)
Anion gap: 10 (ref 5–15)
BUN: 14 mg/dL (ref 8–23)
CO2: 28 mmol/L (ref 22–32)
Calcium: 9.5 mg/dL (ref 8.9–10.3)
Chloride: 106 mmol/L (ref 98–111)
Creatinine: 0.8 mg/dL (ref 0.44–1.00)
GFR, Est AFR Am: >60 mL/min (ref >60)
GFR, Estimated: >60 mL/min (ref >60)
Glucose, Bld: 100 mg/dL — ABNORMAL HIGH (ref 70–99)
Potassium: 4.5 mmol/L (ref 3.5–5.1)
Sodium: 144 mmol/L (ref 135–145)
Total Bilirubin: 0.6 mg/dL (ref 0.3–1.2)
Total Protein: 7 g/dL (ref 6.5–8.1)

## 2019-10-25 LAB — IRON AND TIBC
Iron: 192 ug/dL — ABNORMAL HIGH (ref 41–142)
Saturation Ratios: 45 % (ref 21–57)
TIBC: 424 ug/dL (ref 236–444)
UIBC: 231 ug/dL (ref 120–384)

## 2019-10-25 LAB — FERRITIN: Ferritin: 39 ng/mL (ref 11–307)

## 2019-10-25 NOTE — Progress Notes (Signed)
[place] [phone]   [phone]  CONSULT NOTE  REFERRING PHYSICIAN: Dr. Dema Severin MD  REASON FOR CONSULTATION:  Polycythemia  HPI Danielle Knapp is a 64 y.o. female with a past medical history for hypertension, osteopenia, diverticulitis, and depression is referred to the clinic for recommendations regarding her history of polycythemia.  The patient was seen by her clinic in 2013 for polycythemia.  The patient is a never smoker.  At that time she had [id number] mutation testing performed which was negative.  It was recommended that she undergo blood donation at the [facility] as needed for her polycythemia.  She routinely donates blood approximately once every 3 months.  Her last blood donation was last week, in which her hemoglobin was reportedly 19.1 which was concerning to her as that was higher than normal. She states her hemaglobin typically ranges around 16-18. She then reached out to her primary care provider, Dr. Dema Severin, who referred her to our clinic for evaluation today and further recommendations regarding her condition.  Today, the patient is feeling fairly well without any concerning complaints.  She denies any unusual itching, headaches, paresthesias, or early satiety.  She had an episode of vertigo recently which was diagnosed as benign paroxysmal positional vertigo and she underwent the Epley maneuver x4 with the assistance of physical therapy with improvement in her vertigo.  She still is undergoing physical therapy for assistance with her balance.  She denies any abnormal bleeding or bruising including epistaxis, gingival bleeding, melena, hematochezia, hematemesis, or hematuria.  Patient denies any recent fevers or unexplained weight loss.  She denies any night sweats but states that she is always hot.  She denies any frequent nausea, vomiting, diarrhea, or constipation. She denies any pulmonary disease/COPD, obstructive sleep apnea, cardiac  disease, or kidney disease. She is not currently taking any hormone treatments.  Her family history consists of a mother who had a myocardial infarction, CHF, trigeminal neuralgia, dementia, and iron deficiency.  The patient's father had COPD and diabetes mellitus.  The patient's sister reportedly has ovarian cancer. Patient denies any family history of any polycythemia.   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  She is married and does not have any children.  She denies any tobacco or drug use.  She intermittently drinks red wine.  HPI  Past Medical History:  Diagnosis Date  . Allergic rhinitis   . Allergy   . Arthritis of shoulder   . Cataracts, bilateral    removed with lens implants   . Chest pressure   . Chondromalacia of patella   . Depression   . Diverticulitis   . Glaucoma    BORDERLINE IN LEFT EYE  . Heel spur   . HSV infection    TYPE 1 IN HER NOSE,LIP  . Hx of adenomatous colonic polyps 02/08/2018  . Hypertension   . Infertility, female   . Kidney stones 1/11   spontaneously passed dx'd based on blood test & history of cancer  . Mallet finger    LEFT INDEX FINGER  . Osteoarthritis, knee   . Osteoporosis   . Plantar fasciitis   . Plantar fasciitis of left foot   . Polycythemia   . Situational stress   . Stenosis of cervix   . Tennis elbow    -right    Past Surgical History:  Procedure Laterality Date  . ANAL SPHINCTEROPLASTY  2004   hemorrhoids  . CATARACT EXTRACTION, BILATERAL  2013  .  COLONOSCOPY  2009   Gessner- Tics only   . DIAGNOSTIC LAPAROSCOPY  1996  . FINGER SURGERY    . HEEL SPUR SURGERY Right 2015   Dr. Durward Fortes  . LEFT HEART CATH AND CORONARY ANGIOGRAPHY N/A 11/03/2017   Procedure: LEFT HEART CATH AND CORONARY ANGIOGRAPHY;  Surgeon: Jettie Booze, MD;  Location: [place] [facility];  Service: Cardiovascular;  Laterality: N/A;  . SEPTOPLASTY  1970's   x2  . TONSILLECTOMY      Family History  Problem Relation Age  of Onset  . Osteoporosis Mother   . Heart attack Mother   . Alzheimer's disease Mother   . Diabetes Father   . Hypertension Father   . Heart failure Father   . Ovarian cancer Sister   . Stroke Maternal Grandmother   . Colon cancer Neg Hx   . Colon polyps Neg Hx   . Esophageal cancer Neg Hx   . Rectal cancer Neg Hx   . Stomach cancer Neg Hx     Social History Social History   Tobacco Use  . Smoking status: Never Smoker  . Smokeless tobacco: Never Used  Substance Use Topics  . Alcohol use: Yes    Alcohol/week: 3.0 standard drinks    Types: 3 Glasses of wine per week    Comment: or less  . Drug use: No    Allergies  Allergen Reactions  . Ciprofloxacin Hcl Anaphylaxis and Rash  . Erythromycin Diarrhea  . Flagyl [Metronidazole] Anaphylaxis and Rash  . Nsaids Cough    Can tolerate Ibuprofen.   . Ace Inhibitors   . Lisinopril   . Lunesta [Eszopiclone]     Current Outpatient Medications  Medication Sig Dispense Refill  . amLODipine (NORVASC) 2.5 MG tablet TK 1 T PO QD  1  . Calcium Carb-Cholecalciferol (CALCIUM 600 + D) 600-200 MG-UNIT TABS Take 1 tablet by mouth daily.    Marland Kitchen losartan (COZAAR) 100 MG tablet Take 100 mg by mouth daily.    . sertraline (ZOLOFT) 100 MG tablet Take 150 mg by mouth daily.     Marland Kitchen zolpidem (AMBIEN) 5 MG tablet Take 1 tablet by mouth at bedtime.  0  . valACYclovir (VALTREX) 1000 MG tablet Take 2,000 mg by mouth 2 (two) times daily as needed (for fever blisters).      No current facility-administered medications for this visit.    Review of Systems REVIEW OF SYSTEMS:   Review of Systems  Constitutional: Negative for appetite change, chills, fatigue, fever and unexpected weight change.  HENT: Negative for mouth sores, nosebleeds, sore throat and trouble swallowing.  Eyes: Negative for eye problems and icterus.  Respiratory: Negative for cough, hemoptysis, shortness of breath and wheezing.   Cardiovascular: Negative for chest pain and leg  swelling.  Gastrointestinal: Negative for abdominal pain, constipation, diarrhea, nausea and vomiting.  Genitourinary: Negative for bladder incontinence, difficulty urinating, dysuria, frequency and hematuria.   Musculoskeletal: Negative for back pain, gait problem, neck pain and neck stiffness.  Skin: Negative for itching and rash.  Neurological: Positive for vertigo (improving). Negative for extremity weakness, gait problem, headaches, light-headedness and seizures.  Hematological: Negative for adenopathy. Does not bruise/bleed easily.  Psychiatric/Behavioral: Negative for confusion, depression and sleep disturbance. The patient is not nervous/anxious.     PHYSICAL EXAMINATION:  Blood pressure (!) 149/71, pulse 68, temperature 98.3 F (36.8 C), temperature source Temporal, resp. rate 17, height 5' 6.75" (1.695 m), weight 189 lb 11.2 oz (86 kg), last menstrual period 09/03/2011,  SpO2 100 %.  ECOG PERFORMANCE STATUS: 0  Physical Exam  Constitutional: Oriented to person, place, and time and well-developed, well-nourished, and in no distress.  HENT:  Head: Normocephalic and atraumatic.  Mouth/Throat: Oropharynx is clear and moist. No oropharyngeal exudate.  Eyes: Conjunctivae are normal. Right eye exhibits no discharge. Left eye exhibits no discharge. No scleral icterus.  Neck: Normal range of motion. Neck supple.  Cardiovascular: Normal rate, regular rhythm, normal heart sounds and intact distal pulses.   Pulmonary/Chest: Effort normal and breath sounds normal. No respiratory distress. No wheezes. No rales.  Abdominal: Soft. Bowel sounds are normal. Exhibits no distension and no mass. There is no tenderness.  Musculoskeletal: Normal range of motion. Exhibits no edema.  Lymphadenopathy:    No cervical adenopathy.  Neurological: Alert and oriented to person, place, and time. Exhibits normal muscle tone. Gait normal. Coordination normal.  Skin: Skin is warm and dry. No rash noted. Not  diaphoretic. No erythema. No pallor.  Psychiatric: Mood, memory and judgment normal.  Vitals reviewed.  LABORATORY DATA: Lab Results  Component Value Date   WBC 7.6 10/25/2019   HGB 15.9 (H) 10/25/2019   HCT 43.2 10/25/2019   MCV 86.9 10/25/2019   PLT 273 10/25/2019      Chemistry      Component Value Date/Time   NA 144 10/25/2019 1237   NA 147 (H) 10/31/2017 0829   NA 141 08/11/2012 0935   K 4.5 10/25/2019 1237   K 3.6 08/11/2012 0935   CL 106 10/25/2019 1237   CL 105 08/11/2012 0935   CO2 28 10/25/2019 1237   CO2 26 08/11/2012 0935   BUN 14 10/25/2019 1237   BUN 18 10/31/2017 0829   BUN 18.0 08/11/2012 0935   CREATININE 0.80 10/25/2019 1237   CREATININE 0.8 08/11/2012 0935      Component Value Date/Time   CALCIUM 9.5 10/25/2019 1237   CALCIUM 9.9 08/11/2012 0935   ALKPHOS 67 10/25/2019 1237   ALKPHOS 53 08/11/2012 0935   AST 18 10/25/2019 1237   AST 17 08/11/2012 0935   ALT 25 10/25/2019 1237   ALT 24 08/11/2012 0935   BILITOT 0.6 10/25/2019 1237   BILITOT 0.79 08/11/2012 0935       RADIOGRAPHIC STUDIES: No results found.  ASSESSMENT: This is a very pleasant 64 year old Caucasian female with polycythemia for ~ 8 years.    PLAN: The patient was seen with Dr. Julien Nordmann today.  She had repeat lab studies with CBC, CMP, ferritin, iron studies, hemochromatosis, and repeat molecular studies with JAK2 as well as MPL and CALR next generation sequencing.  The patient CBC demonstrated elevated hemoglobinof 15.9.  The patient had donated blood last week. Her CMP, ferritin, and iron studies are unremarkable.  Her molecular studies and hemochromotosis studies are pending at this time.  I will personally call the patient with the results of the pending lab studies.  If her molecular studies are negative, then no further follow-up appointment is needed and we recommend that the patient continue to undergo periodic blood donation at the [facility].  If her lab  studies are abnormal, I will reach out to the patient and arrange for follow-up.  In the meantime, the patient was advised to avoid iron rich food.   The patient voices understanding of current disease status and treatment options and is in agreement with the current care plan.  All questions were answered. The patient knows to call the clinic with any problems, questions or concerns.  We can certainly see the patient much sooner if necessary.  Thank you so much for allowing me to participate in the care of Danielle Knapp. I will continue to follow up the patient with you and assist in her care.  The total time spent in the appointment was 60 minutes.  Disclaimer: This note was dictated with voice recognition software. Similar sounding words can inadvertently be transcribed and may not be corrected upon review.   Ryken Paschal L Deniece Rankin October 25, 2019, 1:59 PM  ADDENDUM: Hematology/Oncology Attending: I had a face-to-face encounter with the patient.  I recommended her care plan.  This is a very pleasant 64 years old white female who was seen in the clinic in 2013 with polycythemia.  It was felt at that time to represent reactive polycythemia with a negative JAK2 mutations.  The patient continues to have blood donation at regular basis at the [facility] the last few years. Recently she presented for blood donation and she was found to have elevated hemoglobin of 19.1.  The patient was seen by her primary care physician who recommended her to come back for evaluation and recommendation regarding her persistent polycythemia.  The patient is feeling fine today with no concerning complaints.  She is not on any steroids or hormonal therapy.  She is a never smoker and has no evidence of COPD. Repeat CBC today showed hemoglobin of 15.9 and hematocrit 43.2%. I had a lengthy discussion with the patient and her husband today about her condition.  I will order several studies for evaluation of her  polycythemia including repeating iron study and ferritin in addition to hemochromatosis and JAK2 mutation panel. I recommended for the patient to continue with phlebotomy on as-needed basis at the [facility]. If the blood work showed any concerning findings for myeloproliferative disorder, we will call the patient for another appointment and discussion of other treatment options otherwise she will continue to follow-up with her primary care physician at regular basis. The patient was advised to call immediately if she has any other concerning issues in the interval.  Disclaimer: This note was dictated with voice recognition software. Similar sounding words can inadvertently be transcribed and may be missed upon review. Eilleen Kempf, MD 10/25/19

## 2019-10-28 LAB — HEMOCHROMATOSIS DNA-PCR(C282Y,H63D)

## 2019-11-03 ENCOUNTER — Telehealth: Payer: Self-pay | Admitting: Medical Oncology

## 2019-11-03 NOTE — Telephone Encounter (Signed)
Asking about JAK-2 results. Pt notified that the results are not back

## 2019-11-05 ENCOUNTER — Telehealth: Payer: Self-pay | Admitting: Physician Assistant

## 2019-11-05 NOTE — Telephone Encounter (Signed)
Called the patient and let her know that her MPN panel does not show any detected gene abnormalities for PCV. She does not need a follow up visit for routine monitoring with [place] as she is already getting routine physicals/bloodwork through her PCP. She was advised to continue to routinely donate blood through the [facility]. She expressed understanding.

## 2019-11-10 ENCOUNTER — Ambulatory Visit: Payer: 59

## 2019-11-23 ENCOUNTER — Other Ambulatory Visit: Payer: Self-pay

## 2019-11-23 ENCOUNTER — Ambulatory Visit: Payer: 59 | Attending: Otolaryngology

## 2019-11-23 DIAGNOSIS — R42 Dizziness and giddiness: Secondary | ICD-10-CM | POA: Diagnosis present

## 2019-11-23 NOTE — Therapy (Signed)
[place] MA[facility] SERVICES [place], [place], [address] Phone: [phone]   Fax:  [phone]  Physical Therapy Treatment  Patient Details  Name: Danielle Knapp MRN: [id number] Date of Birth: Jan 09, 1956 Referring Provider (PT): Dr. Steele Sizer   Encounter Date: 11/23/2019  PT End of Session - 11/23/19 0816    Visit Number  3    Number of Visits  13    Date for PT Re-Evaluation  12/31/19    Authorization Type  eval 10/08/19    PT Start Time  0802    PT Stop Time  0845    PT Time Calculation (min)  43 min    Equipment Utilized During Treatment  Gait belt    Activity Tolerance  Patient tolerated treatment well    Behavior During Therapy  [facility] for tasks assessed/performed       Past Medical History:  Diagnosis Date  . Allergic rhinitis   . Allergy   . Arthritis of shoulder   . Cataracts, bilateral    removed with lens implants   . Chest pressure   . Chondromalacia of patella   . Depression   . Diverticulitis   . Glaucoma    BORDERLINE IN LEFT EYE  . Heel spur   . HSV infection    TYPE 1 IN HER NOSE,LIP  . Hx of adenomatous colonic polyps 02/08/2018  . Hypertension   . Infertility, female   . Kidney stones 1/11   spontaneously passed dx'd based on blood test & history of cancer  . Mallet finger    LEFT INDEX FINGER  . Osteoarthritis, knee   . Osteoporosis   . Plantar fasciitis   . Plantar fasciitis of left foot   . Polycythemia   . Situational stress   . Stenosis of cervix   . Tennis elbow    -right    Past Surgical History:  Procedure Laterality Date  . ANAL SPHINCTEROPLASTY  2004   hemorrhoids  . CATARACT EXTRACTION, BILATERAL  2013  . COLONOSCOPY  2009   Gessner- Tics only   . DIAGNOSTIC LAPAROSCOPY  1996  . FINGER SURGERY    . HEEL SPUR SURGERY Right 2015   Dr. Durward Fortes  . LEFT HEART CATH AND CORONARY ANGIOGRAPHY N/A 11/03/2017   Procedure: LEFT HEART CATH AND CORONARY ANGIOGRAPHY;  Surgeon:  Jettie Booze, MD;  Location: [facility];  Service: Cardiovascular;  Laterality: N/A;  . SEPTOPLASTY  1970's   x2  . TONSILLECTOMY      There were no vitals filed for this visit.  Subjective Assessment - 11/23/19 0812    Subjective  Pt reports that she is doing well today. She had a period of a couple weeks without any symptoms. However over the last 2 weeks she has noticed a recurrence of her symptoms. She is having some L heel pain upon arrival. She had a spur removed on her R foot but has now had 2-3 weeks of L heel pain. She rates the pain as a 3/10. Otherwise no specific questions at this time.    Pertinent History  Pt reports that she was treated a couple years ago for BPPV. Symptoms resolved but a couple months ago she started having vertigo again. She was seen at [facility] and reports she was treated on three separate visits over the last 3 weeks with the Epley maneuver with some improvement but not complete resolution. She was told that her R side  was the affected side. The last episode she had was this past Tuesday evening. She was last treated on Wednesday at [facility] with the Epley maneuver and she has not had any episodes since that time. She has a VNG study scheduled for next Wednesday 10/13/19. She also reports some general unsteadiness/imbalance which has been going on since prior to the start of her vertigo.    Patient Stated Goals  Resolve vertigo and improve balance    Currently in Pain?  Yes    Pain Score  3     Pain Location  Heel    Pain Orientation  Left    Pain Descriptors / Indicators  Sore    Pain Type  Acute pain    Pain Onset  1 to 4 weeks ago    Pain Frequency  Intermittent          TREATMENT   CanalithRepositioningTreatment Retested Dix-Hallpike and roll tests bilaterally. L Dix-Hallpike and roll tests are both negative. R roll test is negative for horizontal canal BPPV but positive for posterior canal BPPV with upbeating R torsional  nystagmus with concurrent vertigo. Proceeded to R Dix-Hallpike test which is also positive for upbeating R torsional nystagmus with vertigo. Two to three second latency with nystagmus/vertigo lasting approximately 15 seconds. Pt taken through one bout of Epley maneuver with 2 minute holds in each position. After sitting up at EOB pt has a very strong reversal of nystagmus with severe vertigo and nausea resulting in pt vomiting. Pt given cold wash cloth and waited for nausea to pass. Retested R posterior canal in R Dix-Hallpike position which is now negative. Pt taken through second bout of Epley maneuver to ensure full clearance of debris from canal, again with 2 minute holds in each position.                        PT Short Term Goals - 10/08/19 1608      PT SHORT TERM GOAL #1   Title  Pt will be independent with HEP in order to improve balance and dizziness to decrease fall risk and improve function at home and work.    Time  6    Period  Weeks    Status  New    Target Date  11/19/19        PT Long Term Goals - 10/08/19 1609      PT LONG TERM GOAL #1   Title  Pt will improve FGA by at least 4 points in order to demonstrate clinically significant improvement in balance and decreased risk for falls.    Baseline  10/08/19: 24/30    Time  12    Period  Weeks    Status  New    Target Date  12/31/19      PT LONG TERM GOAL #2   Title  Pt will improve ABC by at least 13% in order to demonstrate clinically significant improvement in balance confidence.    Baseline  10/08/19: 67.5%    Time  12    Period  Weeks    Status  New    Target Date  12/31/19      PT LONG TERM GOAL #3   Title  Pt will improve her single leg stance to greater than 10s to demonstrate improved balance and decrease risk for falls    Baseline  10/08/19: approximately 5-7s bilaterally    Time  12    Period  Weeks  Status  New    Target Date  12/31/19            Plan - 11/23/19 0816     Clinical Impression Statement  Pt continues with positive R Dix-Hallpike test of R posterior canal BPPV. Treated today with two bouts of Epley maneuver with full resolution of vertigo and nystagmus with testing. Encouraged pt to continue with home Epley maneuver three times, once daily with 60s hold in each position if symptoms continue. Pt advised to perform head hang off edge of bed during Epley maneuver to improve clearance of debris from canal. Pt will follow-up for one additional visit with therapist unless symptoms remain completely resolved and pt feels she does not need an additional visit.    Personal Factors and Comorbidities  Age;Comorbidity 1    Comorbidities  depression    Examination-Activity Limitations  Bathing    Examination-Participation Restrictions  Cleaning;Yard Work    Stability/Clinical Decision Making  Stable/Uncomplicated    Rehab Potential  Excellent    PT Frequency  1x / week    PT Duration  12 weeks    PT Treatment/Interventions  ADLs/Self Care Home Management;Aquatic Therapy;Canalith Repostioning;Cryotherapy;Electrical Stimulation;Iontophoresis 4mg /ml Dexamethasone;Moist Heat;Traction;Ultrasound;Contrast Bath;DME Instruction;Gait training;Stair training;Functional mobility training;Therapeutic activities;Therapeutic exercise;Balance training;Neuromuscular re-education;Patient/family education;Manual techniques;Vestibular;Passive range of motion;Dry needling;Spinal Manipulations;Joint Manipulations    PT Next Visit Plan  Repeat canal testing, treat with canalith repositioning treatment if needed, initiate adaptation, habituation, and balance training    PT Home Exercise Plan  Home Epley with head hang off edge of bed    Consulted and Agree with Plan of Care  Patient       Patient will benefit from skilled therapeutic intervention in order to improve the following deficits and impairments:  Dizziness, Decreased balance  Visit Diagnosis: Dizziness and  giddiness     Problem List Patient Active Problem List   Diagnosis Date Noted  . Hx of adenomatous colonic polyps 02/08/2018  . Chest pain 10/15/2017  . Hypertensive urgency 10/15/2017  . Depression   . Hypertension   . Polycythemia 08/11/2012   Phillips Grout PT, DPT, GCS  Danielle Knapp 11/23/2019, 8:52 AM  [place] MAIN [facility] SERVICES [address] [place], [place], [address] Phone: [phone]   Fax:  [phone]  Name: Danielle Knapp MRN: [id number] Date of Birth: 1956-07-19

## 2019-12-01 LAB — JAK2 (INCLUDING V617F AND EXON 12), MPL,& CALR W/RFL MPN PANEL (NGS)

## 2019-12-03 ENCOUNTER — Ambulatory Visit: Payer: 59

## 2019-12-21 ENCOUNTER — Telehealth: Payer: Self-pay | Admitting: Medical Oncology

## 2019-12-21 NOTE — Telephone Encounter (Signed)
Molecular test cost-[facility] lab charge was denied by her insurance for molecular testing-considered "experimental". I gave her number to call [facility] to see if they have pt assistance funds.

## 2020-02-01 DIAGNOSIS — M84376A Stress fracture, unspecified foot, initial encounter for fracture: Secondary | ICD-10-CM

## 2020-02-01 HISTORY — DX: Stress fracture, unspecified foot, initial encounter for fracture: M84.376A

## 2020-07-10 NOTE — Progress Notes (Signed)
64 y.o. G51P0000 Married Caucasian female here for annual exam.    No vaginal bleeding or discharge.  Bladder control is pretty good per patient if she does not wait to empty her bladder.   Patient had vertigo at the beginning of the year.  This is now resolved.   Husband dx with prostate cancer.   Back at work.   Received her Covid booster and flu vaccine.   PCP: Harlan Stains, MD  Patient's last menstrual period was 09/03/2011.           Sexually active: No.  Husband with prostate ca The current method of family planning is post menopausal status.    Exercising: Yes.    walking, up and down steps, moving boxes at work, yard work Smoker:  no  Health Maintenance: Pap: 03-20-18 Neg:Neg HR HPV,02-21-15 Neg:Neg HR HPV, 01/27/13 - Neg;Neg HR HPV. History of abnormal Pap:  Yes, 2013 had colposcopy with Dr. Felicita Gage neg. No treatment to cervix MMG:  12-24-19 normal per patient--[facility]-called for report Colonoscopy: 02-02-18, 2 adenomas, due again 2024 BMD:  11-10-18  Result :Osteopenia multiple sites.  PCP following.  TDaP: 07-18-11 Gardasil:   no HIV: 10-15-17 NR Hep C:donates blood Screening Labs:  PCP.    reports that she has never smoked. She has never used smokeless tobacco. She reports current alcohol use of about 3.0 standard drinks of alcohol per week. She reports that she does not use drugs.  Past Medical History:  Diagnosis Date  . Allergic rhinitis   . Allergy   . Arthritis of shoulder   . Cataracts, bilateral    removed with lens implants   . Chest pressure   . Chondromalacia of patella   . Depression   . Diverticulitis   . Glaucoma    BORDERLINE IN LEFT EYE  . Heel spur   . HSV infection    TYPE 1 IN HER NOSE,LIP  . Hx of adenomatous colonic polyps 02/08/2018  . Hypertension   . Infertility, female   . Kidney stones 1/11   spontaneously passed dx'd based on blood test & history of cancer  . Mallet finger    LEFT INDEX FINGER  . Osteoarthritis, knee    . Osteoporosis   . Plantar fasciitis   . Plantar fasciitis of left foot   . Polycythemia   . Situational stress   . Stenosis of cervix   . Stress fracture of foot 02/2020   right foot  . Tennis elbow    -right    Past Surgical History:  Procedure Laterality Date  . ANAL SPHINCTEROPLASTY  2004   hemorrhoids  . CATARACT EXTRACTION, BILATERAL  2013  . COLONOSCOPY  2009   Gessner- Tics only   . DIAGNOSTIC LAPAROSCOPY  1996  . FINGER SURGERY    . HEEL SPUR SURGERY Right 2015   Dr. Durward Fortes  . LEFT HEART CATH AND CORONARY ANGIOGRAPHY N/A 11/03/2017   Procedure: LEFT HEART CATH AND CORONARY ANGIOGRAPHY;  Surgeon: Jettie Booze, MD;  Location: [facility] [facility];  Service: Cardiovascular;  Laterality: N/A;  . SEPTOPLASTY  1970's   x2  . TONSILLECTOMY      Current Outpatient Medications  Medication Sig Dispense Refill  . amLODipine (NORVASC) 2.5 MG tablet TK 1 T PO QD  1  . buPROPion (WELLBUTRIN XL) 300 MG 24 hr tablet Take by mouth.    . Calcium Carb-Cholecalciferol (CALCIUM 600 + D) 600-200 MG-UNIT TABS Take 1 tablet by mouth daily.    Marland Kitchen  fluticasone (FLONASE) 50 MCG/ACT nasal spray SPRAY 2 SPRAYS TWICE DAILY IN EACH NOSTRIL EVERY DAY    . losartan (COZAAR) 100 MG tablet Take 100 mg by mouth daily.    . sertraline (ZOLOFT) 100 MG tablet Take 150 mg by mouth daily.     . valACYclovir (VALTREX) 1000 MG tablet Take 2,000 mg by mouth 2 (two) times daily as needed (for fever blisters).     . zolpidem (AMBIEN) 10 MG tablet Take 10 mg by mouth at bedtime as needed.     No current facility-administered medications for this visit.    Family History  Problem Relation Age of Onset  . Osteoporosis Mother   . Heart attack Mother   . Alzheimer's disease Mother   . Diabetes Father   . Hypertension Father   . Heart failure Father   . Ovarian cancer Sister   . Stroke Maternal Grandmother   . Colon cancer Neg Hx   . Colon polyps Neg Hx   . Esophageal cancer Neg Hx   . Rectal  cancer Neg Hx   . Stomach cancer Neg Hx     Review of Systems  All other systems reviewed and are negative.   Exam:   BP 122/70 (Cuff Size: Large)   Pulse 66   Ht 5' 6.5" (1.689 m)   Wt 187 lb (84.8 kg)   LMP 09/03/2011   SpO2 98%   BMI 29.73 kg/m     General appearance: alert, cooperative and appears stated age Head: normocephalic, without obvious abnormality, atraumatic Neck: no adenopathy, supple, symmetrical, trachea midline and thyroid normal to inspection and palpation Lungs: clear to auscultation bilaterally Breasts: normal appearance, no masses or tenderness, No nipple retraction or dimpling, No nipple discharge or bleeding, No axillary adenopathy Heart: regular rate and rhythm Abdomen: soft, non-tender; no masses, no organomegaly Extremities: extremities normal, atraumatic, no cyanosis or edema Skin: skin color, texture, turgor normal. No rashes or lesions Lymph nodes: cervical, supraclavicular, and axillary nodes normal. Neurologic: grossly normal  Pelvic: External genitalia:  no lesions              No abnormal inguinal nodes palpated.              Urethra:  normal appearing urethra with no masses, tenderness or lesions              Bartholins and Skenes: normal                 Vagina: normal appearing vagina with normal color and discharge, left vaginal wall polyp 3 - 4 mm.  Atrophy noted.               Cervix: no lesions              Pap taken: No. Bimanual Exam:  Uterus:  normal size, contour, position, consistency, mobility, non-tender              Adnexa: no mass, fullness, tenderness              Rectal exam: Yes.  .  Confirms.              Anus:  normal sphincter tone, no lesions  Chaperone was present for exam.  Assessment:   Well woman visit with normal exam. Osteopenia. PCP following.  HSV 1. Stress incontinence - mild.  Depression/anxiety. HTN. Vaginal polyp.  Plan: Mammogram screening discussed. Self breast awareness reviewed. Pap and HR  HPV as above. Guidelines for Calcium,  Vitamin D, regular exercise program including cardiovascular and weight bearing exercise. Return for vaginal biopsy of polyp.  Procedure and rational explained.  BMD next year.  Follow up annually and prn.

## 2020-07-12 ENCOUNTER — Other Ambulatory Visit: Payer: Self-pay

## 2020-07-12 ENCOUNTER — Ambulatory Visit (INDEPENDENT_AMBULATORY_CARE_PROVIDER_SITE_OTHER): Payer: 59 | Admitting: Obstetrics and Gynecology

## 2020-07-12 ENCOUNTER — Encounter: Payer: Self-pay | Admitting: Obstetrics and Gynecology

## 2020-07-12 VITALS — BP 122/70 | HR 66 | Ht 66.5 in | Wt 187.0 lb

## 2020-07-12 DIAGNOSIS — N842 Polyp of vagina: Secondary | ICD-10-CM

## 2020-07-12 DIAGNOSIS — Z01419 Encounter for gynecological examination (general) (routine) without abnormal findings: Secondary | ICD-10-CM

## 2020-07-12 NOTE — Patient Instructions (Signed)

## 2020-07-20 ENCOUNTER — Telehealth: Payer: Self-pay

## 2020-07-20 NOTE — Telephone Encounter (Signed)
Spoke with pt. Pt calling to schedule vaginal bx that was recommended at [facility] on 11/10 by Dr Quincy Simmonds.  Pt scheduled for 08/08/20 at 430 pm with Dr Quincy Simmonds. Pt agreeable to date and time. Pt prefers end of day due to going home afterwards. Pt advised can take Motrin 800 mg with food and water one hour before procedure. Pt agreeable. Pt given PR per Hayley and verbalized understanding.  Cc: Hayley for precert.  Encounter closed

## 2020-07-20 NOTE — Telephone Encounter (Signed)
Patient would like to schedule biopsy.

## 2020-08-08 ENCOUNTER — Other Ambulatory Visit (HOSPITAL_COMMUNITY)
Admission: RE | Admit: 2020-08-08 | Discharge: 2020-08-08 | Disposition: A | Discharge status: Home / Self Care | Payer: 59 | Source: Ambulatory Visit | Attending: Obstetrics and Gynecology | Admitting: Obstetrics and Gynecology

## 2020-08-08 ENCOUNTER — Encounter: Payer: Self-pay | Admitting: Obstetrics and Gynecology

## 2020-08-08 ENCOUNTER — Other Ambulatory Visit: Payer: Self-pay

## 2020-08-08 ENCOUNTER — Ambulatory Visit (INDEPENDENT_AMBULATORY_CARE_PROVIDER_SITE_OTHER): Payer: 59 | Admitting: Obstetrics and Gynecology

## 2020-08-08 DIAGNOSIS — N842 Polyp of vagina: Secondary | ICD-10-CM

## 2020-08-08 NOTE — Progress Notes (Signed)
GYNECOLOGY  VISIT   HPI: 64 y.o.   Married  Caucasian  female   [place] with Patient's last menstrual period was 09/03/2011.   here for vaginal biopsy.     Patient has a left vaginal sidewall polyp/growth.   GYNECOLOGIC HISTORY: Patient's last menstrual period was 09/03/2011. Contraception:  PMP Menopausal hormone therapy:  none Last mammogram: 12-24-19 3D/stable benign appearing calcifications Lt.Br./Neg/density C/BiRads2 Last pap smear: 03-20-18 Neg:Neg HR HPV,02-21-15 Neg:Neg HR HPV, 01/27/13 - Neg;Neg HR HPV.        OB History    Gravida  0   Para  0   Term  0   Preterm  0   AB  0   Living  0     SAB  0   TAB  0   Ectopic  0   Multiple  0   Live Births                 Patient Active Problem List   Diagnosis Date Noted   Hx of adenomatous colonic polyps 02/08/2018   Chest pain 10/15/2017   Hypertensive urgency 10/15/2017   Depression    Hypertension    Polycythemia 08/11/2012    Past Medical History:  Diagnosis Date   Allergic rhinitis    Allergy    Arthritis of shoulder    Cataracts, bilateral    removed with lens implants    Chest pressure    Chondromalacia of patella    Depression    Diverticulitis    Glaucoma    BORDERLINE IN LEFT EYE   Heel spur    HSV infection    TYPE 1 IN HER NOSE,LIP   Hx of adenomatous colonic polyps 02/08/2018   Hypertension    Infertility, female    Kidney stones 1/11   spontaneously passed dx'd based on blood test & history of cancer   Mallet finger    LEFT INDEX FINGER   Osteoarthritis, knee    Osteoporosis    Plantar fasciitis    Plantar fasciitis of left foot    Polycythemia    Situational stress    Stenosis of cervix    Stress fracture of foot 02/2020   right foot   Tennis elbow    -right    Past Surgical History:  Procedure Laterality Date   ANAL SPHINCTEROPLASTY  2004   hemorrhoids   CATARACT EXTRACTION, BILATERAL  2013   COLONOSCOPY  2009   Gessner- Tics  only    DIAGNOSTIC LAPAROSCOPY  1996   FINGER SURGERY     HEEL SPUR SURGERY Right 2015   Dr. Durward Fortes   LEFT HEART CATH AND CORONARY ANGIOGRAPHY N/A 11/03/2017   Procedure: LEFT HEART CATH AND CORONARY ANGIOGRAPHY;  Surgeon: Jettie Booze, MD;  Location: [facility] [facility];  Service: Cardiovascular;  Laterality: N/A;   SEPTOPLASTY  1970's   x2   TONSILLECTOMY      Current Outpatient Medications  Medication Sig Dispense Refill   amLODipine (NORVASC) 2.5 MG tablet TK 1 T PO QD  1   buPROPion (WELLBUTRIN XL) 300 MG 24 hr tablet Take by mouth.     Calcium Carb-Cholecalciferol (CALCIUM 600 + D) 600-200 MG-UNIT TABS Take 1 tablet by mouth daily.     fluticasone (FLONASE) 50 MCG/ACT nasal spray SPRAY 2 SPRAYS TWICE DAILY IN EACH NOSTRIL EVERY DAY     losartan (COZAAR) 100 MG tablet Take 100 mg by mouth daily.     sertraline (ZOLOFT) 100 MG tablet  Take 150 mg by mouth daily.      valACYclovir (VALTREX) 1000 MG tablet Take 2,000 mg by mouth 2 (two) times daily as needed (for fever blisters).      zolpidem (AMBIEN) 10 MG tablet Take 10 mg by mouth at bedtime as needed.     No current facility-administered medications for this visit.     ALLERGIES: Ciprofloxacin hcl, Erythromycin, Flagyl [metronidazole], Nsaids, Ace inhibitors, Lisinopril, and Lunesta [eszopiclone]  Family History  Problem Relation Age of Onset   Osteoporosis Mother    Heart attack Mother    Alzheimer's disease Mother    Diabetes Father    Hypertension Father    Heart failure Father    Ovarian cancer Sister    Stroke Maternal Grandmother    Colon cancer Neg Hx    Colon polyps Neg Hx    Esophageal cancer Neg Hx    Rectal cancer Neg Hx    Stomach cancer Neg Hx     Social History   Socioeconomic History   Marital status: Married    Spouse name: Not on file   Number of children: Not on file   Years of education: Not on file   Highest education level: Not on file  Occupational  History   Not on file  Tobacco Use   Smoking status: Never Smoker   Smokeless tobacco: Never Used  Vaping Use   Vaping Use: Never used  Substance and Sexual Activity   Alcohol use: Yes    Alcohol/week: 3.0 standard drinks    Types: 3 Glasses of wine per week    Comment: or less   Drug use: No   Sexual activity: Not Currently    Partners: Male    Birth control/protection: Post-menopausal    Comment: husband with prostate cancer  Other Topics Concern   Not on file  Social History Narrative   Not on file   Social Determinants of Health   Financial Resource Strain:    Difficulty of Paying Living Expenses: Not on file  Food Insecurity:    Worried About [place] in the Last Year: Not on file   [employer] of Food in the Last Year: Not on file  Transportation Needs:    Lack of Transportation (Medical): Not on file   Lack of Transportation (Non-Medical): Not on file  Physical Activity:    Days of Exercise per Week: Not on file   Minutes of Exercise per Session: Not on file  Stress:    Feeling of Stress : Not on file  Social Connections:    Frequency of Communication with Friends and Family: Not on file   Frequency of Social Gatherings with Friends and Family: Not on file   Attends Religious Services: Not on file   Active Member of Clubs or Organizations: Not on file   Attends Archivist Meetings: Not on file   Marital Status: Not on file  Intimate Partner Violence:    Fear of Current or Ex-Partner: Not on file   Emotionally Abused: Not on file   Physically Abused: Not on file   Sexually Abused: Not on file    Review of Systems  All other systems reviewed and are negative.   PHYSICAL EXAMINATION:    BP (!) 142/70 (Cuff Size: Large)    Pulse 60    Ht 5' 6.5" (1.689 m)    Wt 187 lb (84.8 kg)    LMP 09/03/2011    SpO2 100%  BMI 29.73 kg/m     General appearance: alert, cooperative and appears stated age   Pelvic: External  genitalia:  no lesions              Urethra:  normal appearing urethra with no masses, tenderness or lesions              Bartholins and Skenes: normal                 Vagina: normal appearing vagina with normal color and discharge,  4 mm biopsy of left mid vaginal wall.  Hibiclens prep.  Tichler used.  Tissue to pathology.  Silver nitrate placed.  No complications. Minimal EBL.               Cervix: no lesions                Chaperone was present for exam.  ASSESSMENT  Left vaginal wall polyp.  PLAN  Fu pathology report.  Post biopsy instructions and precautions given.  Fu prn.

## 2020-08-10 LAB — SURGICAL PATHOLOGY

## 2020-09-26 ENCOUNTER — Other Ambulatory Visit: Payer: Self-pay | Admitting: Family Medicine

## 2020-09-26 ENCOUNTER — Ambulatory Visit
Admission: RE | Admit: 2020-09-26 | Discharge: 2020-09-26 | Disposition: A | Discharge status: Home / Self Care | Payer: 59 | Source: Ambulatory Visit | Attending: Family Medicine | Admitting: Family Medicine

## 2020-09-26 DIAGNOSIS — R1032 Left lower quadrant pain: Secondary | ICD-10-CM

## 2020-09-26 MED ORDER — IOPAMIDOL (ISOVUE-300) INJECTION 61%
100.0000 mL | Freq: Once | INTRAVENOUS | Status: AC | PRN
  Administered 2020-09-26: 100 mL via INTRAVENOUS

## 2020-09-27 ENCOUNTER — Telehealth: Payer: Self-pay | Admitting: *Deleted

## 2020-09-27 ENCOUNTER — Encounter: Payer: Self-pay | Admitting: Obstetrics and Gynecology

## 2020-09-27 ENCOUNTER — Other Ambulatory Visit: Payer: Self-pay

## 2020-09-27 ENCOUNTER — Ambulatory Visit (INDEPENDENT_AMBULATORY_CARE_PROVIDER_SITE_OTHER): Payer: 59 | Admitting: Obstetrics and Gynecology

## 2020-09-27 ENCOUNTER — Other Ambulatory Visit (HOSPITAL_COMMUNITY)
Admission: RE | Admit: 2020-09-27 | Discharge: 2020-09-27 | Disposition: A | Discharge status: Home / Self Care | Payer: 59 | Source: Ambulatory Visit | Attending: Obstetrics and Gynecology | Admitting: Obstetrics and Gynecology

## 2020-09-27 VITALS — BP 134/80 | HR 70 | Ht 66.5 in | Wt 180.0 lb

## 2020-09-27 DIAGNOSIS — N882 Stricture and stenosis of cervix uteri: Secondary | ICD-10-CM

## 2020-09-27 DIAGNOSIS — Z124 Encounter for screening for malignant neoplasm of cervix: Secondary | ICD-10-CM | POA: Diagnosis not present

## 2020-09-27 DIAGNOSIS — N858 Other specified noninflammatory disorders of uterus: Secondary | ICD-10-CM | POA: Diagnosis not present

## 2020-09-27 NOTE — Telephone Encounter (Signed)
I have reviewed the CT scan.  Please schedule office visit with me. I can see her this afternoon if she is available.

## 2020-09-27 NOTE — Telephone Encounter (Signed)
Will route to appointments to call and schedule.

## 2020-09-27 NOTE — Telephone Encounter (Signed)
Patient called stating her PCP ordered a CT abdomen andpelvis scan yesterday and asked you to review the scan. Please advise

## 2020-09-27 NOTE — Progress Notes (Signed)
GYNECOLOGY  VISIT   HPI: 65 y.o.   Married  Caucasian  female   [id number] with Patient's last menstrual period was 09/03/2011.   here to discuss CT results from 09/26/20 showing a pelvic mass.  She has had pelvic pain and bloating with back pain since early November. Originally, she was treated for diverticulitis in December.  She denies vaginal bleeding, blood in urine, or blood in stool. Not using pain medication.   CLINICAL DATA:  Left lower quadrant pain for 1 month.  EXAM: CT ABDOMEN AND PELVIS WITH CONTRAST  TECHNIQUE: Multidetector CT imaging of the abdomen and pelvis was performed using the standard protocol following bolus administration of intravenous contrast.  CONTRAST:  161mL ISOVUE-300 IOPAMIDOL (ISOVUE-300) INJECTION 61%  COMPARISON:  05/04/2017  FINDINGS: Lower chest: Unremarkable.  Hepatobiliary: No suspicious focal abnormality within the liver parenchyma. There is no evidence for gallstones, gallbladder wall thickening, or pericholecystic fluid. No intrahepatic or extrahepatic biliary dilation.  Pancreas: No focal mass lesion. No dilatation of the main duct. No intraparenchymal cyst. No peripancreatic edema.  Spleen: No splenomegaly. No focal mass lesion.  Adrenals/Urinary Tract: No adrenal nodule or mass. 3-4 mm nonobstructing stone identified upper pole right kidney. Stable 5 mm hypoattenuating lesion lower pole right kidney, too small to characterize but interval stability is reassuring for benign etiology. There is also a tiny hypoattenuating lesion lower pole left kidney, too small to characterize but likely benign. No evidence for hydroureter. The urinary bladder appears normal for the degree of distention.  Stomach/Bowel: Stomach is unremarkable. No gastric wall thickening. No evidence of outlet obstruction. Duodenum is normally positioned as is the ligament of Treitz. No small bowel wall thickening. No small bowel dilatation. The  terminal ileum is normal. The appendix is not visualized, but there is no edema or inflammation in the region of the cecum. No gross colonic mass. No colonic wall thickening. Diverticular changes are noted in the left colon without evidence of diverticulitis.  Vascular/Lymphatic: There is abdominal aortic atherosclerosis without aneurysm. There is no gastrohepatic or hepatoduodenal ligament lymphadenopathy. No retroperitoneal or mesenteric lymphadenopathy. No pelvic sidewall lymphadenopathy.  Reproductive: 9.8 x 7.2 x 7.9 cm hypoenhancing mass is identified in the lower uterus, apparently arising from the endometrial canal. The endometrial canal in the fundus of the uterus is not dilated. This appears to be above the cervix, in the lower uterine segment but could be arising from the upper cervix. There is no adnexal mass.  Other: No intraperitoneal free fluid.  Musculoskeletal: No worrisome lytic or sclerotic osseous abnormality.  IMPRESSION: 1. Large hypoenhancing mass in the region of the lower uterus/upper cervix. This appears to expand the endometrial canal the lower uterine segment although the endometrial canal in the uterine fundus is nondilated. As such, endometrial canal/cervical stricture is not considered likely. Rather, imaging features are highly concerning for neoplasm, likely of endometrial origin. 2. No findings of metastatic disease. Specifically, no evidence for lymphadenopathy in the abdomen/pelvis. No peritoneal or omental nodularity. No ascites. 3. Left colonic diverticulosis without diverticulitis  These results will be called to the ordering clinician or representative by the Radiologist Assistant, and communication documented in the PACS or [employer].   Electronically Signed   By: Misty Stanley M.D.   On: 09/26/2020 16:01   GYNECOLOGIC HISTORY: Patient's last menstrual period was 09/03/2011. Contraception:  PMP Menopausal hormone  therapy:  none Last mammogram:  12-24-19 3D/stable benign appearing calcifications Lt.Br./Neg/density C/BiRads2  Last pap smear: 03-20-18 Neg:Neg HR HPV,02-21-15 Neg:Neg HR  HPV, 01/27/13 - Neg;Neg HR HPV.        OB History    Gravida  0   Para  0   Term  0   Preterm  0   AB  0   Living  0     SAB  0   IAB  0   Ectopic  0   Multiple  0   Live Births                 Patient Active Problem List   Diagnosis Date Noted   Hx of adenomatous colonic polyps 02/08/2018   Chest pain 10/15/2017   Hypertensive urgency 10/15/2017   Depression    Hypertension    Polycythemia 08/11/2012    Past Medical History:  Diagnosis Date   Allergic rhinitis    Allergy    Arthritis of shoulder    Cataracts, bilateral    removed with lens implants    Chest pressure    Chondromalacia of patella    Depression    Diverticulitis    Glaucoma    BORDERLINE IN LEFT EYE   Heel spur    HSV infection    TYPE 1 IN HER NOSE,LIP   Hx of adenomatous colonic polyps 02/08/2018   Hypertension    Infertility, female    Kidney stones 1/11   spontaneously passed dx'd based on blood test & history of cancer   Mallet finger    LEFT INDEX FINGER   Osteoarthritis, knee    Osteoporosis    Plantar fasciitis    Plantar fasciitis of left foot    Polycythemia    Situational stress    Stenosis of cervix    Stress fracture of foot 02/2020   right foot   Tennis elbow    -right    Past Surgical History:  Procedure Laterality Date   ANAL SPHINCTEROPLASTY  2004   hemorrhoids   CATARACT EXTRACTION, BILATERAL  2013   COLONOSCOPY  2009   Gessner- Tics only    DIAGNOSTIC LAPAROSCOPY  1996   FINGER SURGERY     HEEL SPUR SURGERY Right 2015   Dr. Durward Fortes   LEFT HEART CATH AND CORONARY ANGIOGRAPHY N/A 11/03/2017   Procedure: LEFT HEART CATH AND CORONARY ANGIOGRAPHY;  Surgeon: Jettie Booze, MD;  Location: [facility];  Service: Cardiovascular;   Laterality: N/A;   SEPTOPLASTY  1970's   x2   TONSILLECTOMY      Current Outpatient Medications  Medication Sig Dispense Refill   amLODipine (NORVASC) 2.5 MG tablet TK 1 T PO QD  1   buPROPion (WELLBUTRIN XL) 300 MG 24 hr tablet Take by mouth.     Calcium Carb-Cholecalciferol 600-200 MG-UNIT TABS Take 1 tablet by mouth daily.     Chlorphen-Pseudoephed-APAP (CORICIDIN D PO) Take 1 tablet by mouth daily.     fluticasone (FLONASE) 50 MCG/ACT nasal spray SPRAY 2 SPRAYS TWICE DAILY IN EACH NOSTRIL EVERY DAY     losartan (COZAAR) 100 MG tablet Take 100 mg by mouth daily.     omeprazole (PRILOSEC) 40 MG capsule Take 40 mg by mouth daily.     sertraline (ZOLOFT) 100 MG tablet Take 150 mg by mouth daily.      valACYclovir (VALTREX) 1000 MG tablet Take 2,000 mg by mouth 2 (two) times daily as needed (for fever blisters).      zolpidem (AMBIEN) 10 MG tablet Take 10 mg by mouth at bedtime as needed.  No current facility-administered medications for this visit.     ALLERGIES: Ciprofloxacin hcl, Erythromycin, Flagyl [metronidazole], Nsaids, Ace inhibitors, Lisinopril, and Lunesta [eszopiclone]  Family History  Problem Relation Age of Onset   Osteoporosis Mother    Heart attack Mother    Alzheimer's disease Mother    Diabetes Father    Hypertension Father    Heart failure Father    Ovarian cancer Sister    Stroke Maternal Grandmother    Colon cancer Neg Hx    Colon polyps Neg Hx    Esophageal cancer Neg Hx    Rectal cancer Neg Hx    Stomach cancer Neg Hx     Social History   Socioeconomic History   Marital status: Married    Spouse name: Not on file   Number of children: Not on file   Years of education: Not on file   Highest education level: Not on file  Occupational History   Not on file  Tobacco Use   Smoking status: Never Smoker   Smokeless tobacco: Never Used  Vaping Use   Vaping Use: Never used  Substance and Sexual Activity    Alcohol use: Yes    Alcohol/week: 3.0 standard drinks    Types: 3 Glasses of wine per week    Comment: or less   Drug use: No   Sexual activity: Not Currently    Partners: Male    Birth control/protection: Post-menopausal    Comment: husband with prostate cancer  Other Topics Concern   Not on file  Social History Narrative   Not on file   Social Determinants of Health   Financial Resource Strain: Not on file  Food Insecurity: Not on file  Transportation Needs: Not on file  Physical Activity: Not on file  Stress: Not on file  Social Connections: Not on file  Intimate Partner Violence: Not on file    Review of Systems  Gastrointestinal: Positive for abdominal pain (epigastric).  Genitourinary: Positive for pelvic pain.       Low back pain  All other systems reviewed and are negative.   PHYSICAL EXAMINATION:    BP 134/80    Pulse 70    Ht 5' 6.5" (1.689 m)    Wt 180 lb (81.6 kg)    LMP 09/03/2011    SpO2 96%    BMI 28.62 kg/m     General appearance: alert, cooperative and appears stated age   Abdomen: soft, non-tender, lower abdomen slightly distended,  no organomegaly   No abnormal inguinal nodes palpated  Pelvic: External genitalia:  no lesions              Urethra:  normal appearing urethra with no masses, tenderness or lesions              Bartholins and Skenes: normal                 Vagina: normal appearing vagina with normal color and discharge, no lesions              Cervix: no lesions.  Cervical stenosis.                 Bimanual Exam:  Uterus:  10 cm fullness with tenderness just above the cervix.               Adnexa: no mass, fullness, tenderness              Rectal exam: Yes.  .  Confirms.  Anus:  normal sphincter tone, no lesions  Chaperone was present for exam.  ASSESSMENT  Pelvic mass.  Cervical stenosis.  PLAN  We reviewed her CT images and report.  Pap collected. Return for pelvic ultrasound.    30 min  total time was  spent for this patient encounter, including preparation, face-to-face counseling with the patient, coordination of care, and documentation of the encounter.

## 2020-09-27 NOTE — Telephone Encounter (Signed)
Patient coming today at 4pm

## 2020-09-28 ENCOUNTER — Ambulatory Visit (INDEPENDENT_AMBULATORY_CARE_PROVIDER_SITE_OTHER): Payer: 59 | Admitting: Obstetrics and Gynecology

## 2020-09-28 ENCOUNTER — Other Ambulatory Visit: Payer: Self-pay | Admitting: Obstetrics and Gynecology

## 2020-09-28 ENCOUNTER — Other Ambulatory Visit: Payer: Self-pay | Admitting: *Deleted

## 2020-09-28 ENCOUNTER — Encounter: Payer: Self-pay | Admitting: Obstetrics and Gynecology

## 2020-09-28 ENCOUNTER — Ambulatory Visit (INDEPENDENT_AMBULATORY_CARE_PROVIDER_SITE_OTHER): Payer: 59

## 2020-09-28 VITALS — BP 144/84 | HR 68 | Ht 66.5 in | Wt 180.0 lb

## 2020-09-28 DIAGNOSIS — R102 Pelvic and perineal pain unspecified side: Secondary | ICD-10-CM

## 2020-09-28 DIAGNOSIS — N9489 Other specified conditions associated with female genital organs and menstrual cycle: Secondary | ICD-10-CM

## 2020-09-28 DIAGNOSIS — N882 Stricture and stenosis of cervix uteri: Secondary | ICD-10-CM

## 2020-09-28 DIAGNOSIS — N858 Other specified noninflammatory disorders of uterus: Secondary | ICD-10-CM | POA: Diagnosis not present

## 2020-09-28 NOTE — Patient Instructions (Signed)
Atlas of pelvic anatomy and gynecologic surgery (4th ed., pp. 205-212). [place], [place]: Elsevier.">  Dilation and Curettage or Vacuum Curettage Dilation and curettage (D&C) and vacuum curettage are minor procedures. A D&C involves stretching the cervix (dilation) and scraping the inside lining of the uterus with surgical instruments (curettage). During a D&C, tissue is gently scraped from the lining of the uterus (endometrium), starting from the top portion of the uterus down to the lowest part of the uterus. During a vacuum curettage, the lining and tissue in the uterus are removed with the use of gentle suction. Curettage may be performed to either diagnose or treat a problem. For diagnosis A diagnostic curettage may be done if you have:  Irregular bleeding in the uterus.  Bleeding with the development of clots.  Spotting between menstrual periods.  Prolonged menstrual periods or other abnormal bleeding.  Bleeding after menopause.  No menstrual period (amenorrhea).  A change in size and shape of the uterus.  Abnormal endometrial cells discovered during a Pap test. For treatment Curettage may be done:  To remove an IUD (intrauterine device).  To remove remaining placenta after giving birth.  During an abortion.  During a miscarriage.  To remove growths in the lining of the uterus.  To remove some rare types of non-cancerous lumps (fibroids). Tell a health care provider about:  Any allergies you have, including allergies to prescribed medicine or latex.  All medicines you are taking, including vitamins, herbs, eye drops, creams, and over-the-counter medicines.  Any blood-thinning medicine you may be taking.  Any problems you or family members have had with anesthetic medicines.  Any blood disorders you have.  Any surgeries you have had.  Your medical history and any medical conditions you have.  Whether you are pregnant or may be pregnant.  Recent vaginal  infections you have had.  Recent menstrual periods, bleeding problems you have had, and what form of birth control (contraception) you use. What are the risks? Generally, this is a safe procedure. However, problems may occur, including:  Infection.  Heavy vaginal bleeding.  Allergic reactions to medicines.  Damage to the cervix or other structures or organs.  Development of scar tissue (adhesions) inside the uterus. This can cause abnormal periods and may make it harder to get pregnant.  A hole (perforation) in the wall of the uterus. This is rare. What happens before the procedure? Staying hydrated Follow instructions from your health care provider about hydration, which may include:  Up to 2 hours before the procedure - you may continue to drink clear liquids, such as water, clear fruit juice, black coffee, and plain tea.   Eating and drinking restrictions Follow instructions from your health care provider about eating and drinking, which may include:  8 hours before the procedure - stop eating heavy meals or foods, such as meat, fried foods, or fatty foods.  6 hours before the procedure - stop eating light meals or foods, such as toast or cereal.  6 hours before the procedure - stop drinking milk or drinks that contain milk.  2 hours before the procedure - stop drinking clear liquids. If your health care provider told you to take your medicine(s) on the day of your procedure, take them with only a sip of water. Medicines  Ask your health care provider about: ? Changing or stopping your regular medicines. This is especially important if you are taking diabetes medicines or blood thinners. ? Taking medicines such as aspirin and ibuprofen. These medicines can   thin your blood. Do not take these medicines unless your health care provider tells you to take them. ? Taking over-the-counter medicines, vitamins, herbs, and supplements.  You may be given a medicine to soften the cervix  in order to help with dilation. Surgery safety Ask your health care provider what steps will be taken to help prevent infection. These may include:  Removing hair at the surgery site.  Washing skin with a germ-killing soap.  Taking antibiotic medicine. General instructions  Do not use any products that contain nicotine or tobacco for at least 4 weeks before the procedure. These products include cigarettes, e-cigarettes, and chewing tobacco. If you need help quitting, ask your health care provider.  For 24 hours before your procedure, do not: ? Douche. ? Use tampons. ? Use medicines, creams, or suppositories in the vagina. ? Have sex.  You may be given a pregnancy test on the day of the procedure.  You may have a blood or urine sample taken.  Plan to have someone take you home from the hospital or clinic.  If you will be going home right after the procedure, plan to have someone with you for 24 hours. What happens during the procedure?  An IV will be inserted into one of your veins.  You will be given one of the following: ? A medicine that numbs the area in and around the cervix (local anesthetic). ? A medicine to make you fall asleep (general anesthetic).  You will lie down on your back, with your feet in foot rests (stirrups).  The size and position of your uterus will be checked.  A lubricated instrument (speculum or Sims retractor) will be inserted into the back side of your vagina. The speculum will be used to hold apart the walls of your vagina so your health care provider can see your cervix.  A tool (tenaculum) will be attached to the lip of the cervix to stabilize it.  Your cervix will be softened and dilated. This may be done by: ? Taking medicine, either orally or vaginally. ? Having thin rods (laminaria) or gradual widening instruments (tapered dilators) inserted into your cervix.  A small, sharp, curved instrument (curette) will be used to scrape a small  amount of tissue or cells from the endometrium or cervical canal. In some cases, gentle suction is applied with the curette.  The curette will then be removed.  The cells will be taken to a lab for testing. The procedure may vary among health care providers and hospitals.   What happens after the procedure?  Your blood pressure, heart rate, breathing rate, and blood oxygen level will be monitored until you leave the hospital or clinic.  You may have mild cramping, backache, pain, and light bleeding or spotting. You may pass small blood clots from your vagina.  You may have to wear compression stockings. These stockings help to prevent blood clots and reduce swelling in your legs. Summary  Dilation and curettage (D&C) involves stretching (dilating) the cervix and scraping the inside lining of the uterus (curettage).  Follow your health care provider's instructions about when to stop eating and drinking, and whether to stop or change any medicines.  After the procedure, you may have mild cramping, backache, pain, and light bleeding or spotting. You may pass small blood clots from your vagina.  Plan to have someone take you home from the hospital or clinic. This information is not intended to replace advice given to you by your health care   provider. Make sure you discuss any questions you have with your health care provider. Document Revised: 09/21/2019 Document Reviewed: 09/21/2019 Elsevier Patient Education  2021 Elsevier Inc. Hysteroscopy Hysteroscopy is a procedure used to look inside a woman's womb (uterus). This may be done for various reasons, including:  To look for tumors and other growths in the uterus.  To evaluate abnormal bleeding, fibroid tumors, polyps, scar tissue, or uterine cancer.  To determine why a woman is unable to get pregnant or has had repeated pregnancy losses.  To locate an IUD (intrauterine device).  To place a birth control device into the fallopian  tubes. During this procedure, a thin, flexible tube with a small light and camera (hysteroscope) is used to examine the uterus. The camera sends images to a monitor in the room so that your health care provider can view the inside of your uterus. A hysteroscopy should be done right after a menstrual period. Tell a health care provider about:  Any allergies you have.  All medicines you are taking, including vitamins, herbs, eye drops, creams, and over-the-counter medicines.  Any problems you or family members have had with anesthetic medicines.  Any blood disorders you have.  Any surgeries you have had.  Any medical conditions you have.  Whether you are pregnant or may be pregnant.  Whether you have been diagnosed with an STI (sexually transmitted infection) or you think you have an STI. What are the risks? Generally, this is a safe procedure. However, problems may occur, including:  Excessive bleeding.  Infection.  Damage to the uterus or other structures or organs.  Allergic reaction to medicines or fluids that are used in the procedure. What happens before the procedure? Staying hydrated Follow instructions from your health care provider about hydration, which may include:  Up to 2 hours before the procedure - you may continue to drink clear liquids, such as water, clear fruit juice, black coffee, and plain tea. Eating and drinking restrictions Follow instructions from your health care provider about eating and drinking, which may include:  8 hours before the procedure - stop eating solid foods and drink clear liquids only.  2 hours before the procedure - stop drinking clear liquids. Medicines  Ask your health care provider about: ? Changing or stopping your regular medicines. This is especially important if you are taking diabetes medicines or blood thinners. ? Taking medicines such as aspirin and ibuprofen. These medicines can thin your blood. Do not take these  medicines unless your health care provider tells you to take them. ? Taking over-the-counter medicines, vitamins, herbs, and supplements.  Medicine may be placed in your cervix the day before the procedure. This medicine causes the cervix to open (dilate). The larger opening makes it easier for the hysteroscope to be inserted into the uterus during the procedure. General instructions  Ask your health care provider: ? What steps will be taken to help prevent infection. These steps may include:  Washing skin with a germ-killing soap.  Taking antibiotic medicine.  Do not use any products that contain nicotine or tobacco for at least 4 weeks before the procedure. These products include cigarettes, chewing tobacco, and vaping devices, such as e-cigarettes. If you need help quitting, ask your health care provider.  Plan to have a responsible adult take you home from the hospital or clinic.  Plan to have a responsible adult care for you for the time you are told after you leave the hospital or clinic. This is important.    Empty your bladder before the procedure begins. What happens during the procedure?  An IV will be inserted into one of your veins.  You may be given: ? A medicine to help you relax (sedative). ? A medicine that numbs the area around the cervix (local anesthetic). ? A medicine to make you fall asleep (general anesthetic).  A hysteroscope will be inserted through your vagina and into your uterus.  Air or fluid will be used to enlarge your uterus to allow your health care provider to see it better. The amount of fluid used will be carefully checked throughout the procedure.  In some cases, tissue may be gently scraped from inside the uterus and sent to a lab for testing (biopsy). The procedure may vary among health care providers and hospitals. What can I expect after the procedure?  Your blood pressure, heart rate, breathing rate, and blood oxygen level will be monitored  until you leave the hospital or clinic.  You may have cramps. You may be given medicines for this.  You may have bleeding, which may vary from light spotting to menstrual-like bleeding. This is normal.  If you had a biopsy, it is up to you to get the results. Ask your health care provider, or the department that is doing the procedure, when your results will be ready. Follow these instructions at home: Activity  Rest as told by your health care provider.  Return to your normal activities as told by your health care provider. Ask your health care provider what activities are safe for you.  If you were given a sedative during the procedure, it can affect you for several hours. Do not drive or operate machinery until your health care provider says that it is safe. Medicines  Do not take aspirin or other NSAIDs during recovery, as told by your healthcare provider. It can increase the risk of bleeding.  Ask your health care provider if the medicine prescribed to you: ? Requires you to avoid driving or using machinery. ? Can cause constipation. You may need to take these actions to prevent or treat constipation:  Drink enough fluid to keep your urine pale yellow.  Take over-the-counter or prescription medicines.  Eat foods that are high in fiber, such as beans, whole grains, and fresh fruits and vegetables.  Limit foods that are high in fat and processed sugars, such as fried or sweet foods. General instructions  Do not douche, use tampons, or have sex for 2 weeks after the procedure, or until your health care provider approves.  Do not take baths, swim, or use a hot tub until your health care provider approves. Take showers instead of baths for 2 weeks, or for as long as told by your health care provider.  Keep all follow-up visits. This is important. Contact a health care provider if:  You feel dizzy or lightheaded.  You feel nauseous.  You have abnormal vaginal  discharge.  You have a rash.  You have pain that does not get better with medicine.  You have chills. Get help right away if:  You have bleeding that is heavier than a normal menstrual period.  You have a fever.  You have pain or cramps that get worse.  You develop new abdominal pain.  You faint.  You have pain in your shoulder.  You are short of breath. Summary  Hysteroscopy is a procedure that is used to look inside a woman's womb (uterus).  After the procedure, you may have bleeding,   which varies from light spotting to menstrual-like bleeding. This is normal. You may also have cramps.  Do not douche, use tampons, or have sex for 2 weeks after the procedure, or until your health care provider approves.  Plan to have a responsible adult take you home from the hospital or clinic. This information is not intended to replace advice given to you by your health care provider. Make sure you discuss any questions you have with your health care provider. Document Revised: 04/05/2020 Document Reviewed: 04/05/2020 Elsevier Patient Education  2021 Elsevier Inc.  

## 2020-09-28 NOTE — Progress Notes (Signed)
GYNECOLOGY  VISIT   HPI: 65 y.o.   Married  Caucasian  female   [id number] with Patient's last menstrual period was 09/03/2011.   here for Pelvic ultrasound for uterine mass detected on CT scan.   GYNECOLOGIC HISTORY: Patient's last menstrual period was 09/03/2011. Contraception:  PMP Menopausal hormone therapy:  none Last mammogram:  12-24-19 3D/stable benign appearing calcifications Lt.Br./Neg/density C/BiRads2 Last pap smear: 09-27-20 pend.,03-20-18 Neg:Neg HR HPV,02-21-15 Neg:Neg HR HPV, 01/27/13 - Neg;Neg HR HPV.        OB History    Gravida  0   Para  0   Term  0   Preterm  0   AB  0   Living  0     SAB  0   IAB  0   Ectopic  0   Multiple  0   Live Births                 Patient Active Problem List   Diagnosis Date Noted  . Hx of adenomatous colonic polyps 02/08/2018  . Chest pain 10/15/2017  . Hypertensive urgency 10/15/2017  . Depression   . Hypertension   . Polycythemia 08/11/2012    Past Medical History:  Diagnosis Date  . Allergic rhinitis   . Allergy   . Arthritis of shoulder   . Cataracts, bilateral    removed with lens implants   . Chest pressure   . Chondromalacia of patella   . Depression   . Diverticulitis   . Glaucoma    BORDERLINE IN LEFT EYE  . Heel spur   . HSV infection    TYPE 1 IN HER NOSE,LIP  . Hx of adenomatous colonic polyps 02/08/2018  . Hypertension   . Infertility, female   . Kidney stones 1/11   spontaneously passed dx'd based on blood test & history of cancer  . Mallet finger    LEFT INDEX FINGER  . Osteoarthritis, knee   . Osteoporosis   . Plantar fasciitis   . Plantar fasciitis of left foot   . Polycythemia   . Situational stress   . Stenosis of cervix   . Stress fracture of foot 02/2020   right foot  . Tennis elbow    -right    Past Surgical History:  Procedure Laterality Date  . ANAL SPHINCTEROPLASTY  2004   hemorrhoids  . CATARACT EXTRACTION, BILATERAL  2013  . COLONOSCOPY  2009   Gessner- Tics  only   . DIAGNOSTIC LAPAROSCOPY  1996  . FINGER SURGERY    . HEEL SPUR SURGERY Right 2015   Dr. Cleophas Dunker  . LEFT HEART CATH AND CORONARY ANGIOGRAPHY N/A 11/03/2017   Procedure: LEFT HEART CATH AND CORONARY ANGIOGRAPHY;  Surgeon: Corky Crafts, MD;  Location: [facility] INVASIVE CV LAB;  Service: Cardiovascular;  Laterality: N/A;  . SEPTOPLASTY  1970's   x2  . TONSILLECTOMY      Current Outpatient Medications  Medication Sig Dispense Refill  . amLODipine (NORVASC) 2.5 MG tablet TK 1 T PO QD  1  . buPROPion (WELLBUTRIN XL) 300 MG 24 hr tablet Take by mouth.    . Calcium Carb-Cholecalciferol 600-200 MG-UNIT TABS Take 1 tablet by mouth daily.    . Chlorphen-Pseudoephed-APAP (CORICIDIN D PO) Take 1 tablet by mouth daily.    . fluticasone (FLONASE) 50 MCG/ACT nasal spray SPRAY 2 SPRAYS TWICE DAILY IN EACH NOSTRIL EVERY DAY    . losartan (COZAAR) 100 MG tablet Take 100 mg by mouth daily.    Marland Kitchen  omeprazole (PRILOSEC) 40 MG capsule Take 40 mg by mouth daily.    . sertraline (ZOLOFT) 100 MG tablet Take 150 mg by mouth daily.     . valACYclovir (VALTREX) 1000 MG tablet Take 2,000 mg by mouth 2 (two) times daily as needed (for fever blisters).     . zolpidem (AMBIEN) 10 MG tablet Take 10 mg by mouth at bedtime as needed.     No current facility-administered medications for this visit.     ALLERGIES: Ciprofloxacin hcl, Erythromycin, Flagyl [metronidazole], Nsaids, Ace inhibitors, Lisinopril, and Lunesta [eszopiclone]  Family History  Problem Relation Age of Onset  . Osteoporosis Mother   . Heart attack Mother   . Alzheimer's disease Mother   . Diabetes Father   . Hypertension Father   . Heart failure Father   . Ovarian cancer Sister   . Stroke Maternal Grandmother   . Colon cancer Neg Hx   . Colon polyps Neg Hx   . Esophageal cancer Neg Hx   . Rectal cancer Neg Hx   . Stomach cancer Neg Hx     Social History   Socioeconomic History  . Marital status: Married    Spouse name: Not on  file  . Number of children: Not on file  . Years of education: Not on file  . Highest education level: Not on file  Occupational History  . Not on file  Tobacco Use  . Smoking status: Never Smoker  . Smokeless tobacco: Never Used  Vaping Use  . Vaping Use: Never used  Substance and Sexual Activity  . Alcohol use: Yes    Alcohol/week: 3.0 standard drinks    Types: 3 Glasses of wine per week    Comment: or less  . Drug use: No  . Sexual activity: Not Currently    Partners: Male    Birth control/protection: Post-menopausal    Comment: husband with prostate cancer  Other Topics Concern  . Not on file  Social History Narrative  . Not on file   Social Determinants of Health   Financial Resource Strain: Not on file  Food Insecurity: Not on file  Transportation Needs: Not on file  Physical Activity: Not on file  Stress: Not on file  Social Connections: Not on file  Intimate Partner Violence: Not on file    Review of Systems  All other systems reviewed and are negative.   PHYSICAL EXAMINATION:    BP (!) 144/84 (Cuff Size: Large)   Pulse 68   Ht 5' 6.5" (1.689 m)   Wt 180 lb (81.6 kg)   LMP 09/03/2011   SpO2 99%   BMI 28.62 kg/m     General appearance: alert, cooperative and appears stated age Head: Normocephalic, without obvious abnormality, atraumatic Lungs: clear to auscultation bilaterally Heart: regular rate and rhythm Abdomen: soft, non-tender, no masses,  no organomegaly Extremities: extremities normal, atraumatic, no cyanosis or edema Skin: Skin color, texture, turgor normal. No rashes or lesions  Pelvic ultrasound - transabdominal and transvaginal.   Uterus with large fluid collection extending from uterus into the cervical canal.  Size of fluid collection is 9.5 x 7.6 x 6.3 cm, approx 240 cc.  There is a 23 x 9 mm intracavitary mass with a feeder vessel.  Left ovary is small and normal  Right ovary not seen.  No adnexal mass.  No free  fluid.  ASSESSMENT  Large uterine fluid collection.  Endometrial mass.  Cervical stenosis.   PLAN  Pelvic ultrasound images and  report discussed.  We discussed options for care - hysteroscopy with drainage of uterine fluid and resection of endometrial mass, dilation and curettage versus direct referral to [facility] for potential hysterectomy.  Risks of hysteroscopy with dilation and curettage include but are not limited to bleeding, infection, damage to surrounding organs including uterine perforation requiring hospitalization and laparoscopy, pulmonary edema, reaction to anesthesia, DVT, PE, death, need for further treatment and surgery.   Surgical recovery discussed.   I will reach out to [facility] to discuss the case and finalize a plan.   FU pap.    32 min total time was spent for this patient encounter, including preparation, face-to-face counseling with the patient, coordination of care, and documentation of the encounter.

## 2020-09-29 ENCOUNTER — Telehealth: Payer: Self-pay | Admitting: Obstetrics and Gynecology

## 2020-09-29 DIAGNOSIS — N858 Other specified noninflammatory disorders of uterus: Secondary | ICD-10-CM

## 2020-09-29 NOTE — Telephone Encounter (Signed)
Phone call to patient in follow up to visit yesterday.    I had contact with Dr. Berline Lopes, [facility] [place], who is recommending an MRI of the pelvis prior to proceeding with the hysteroscopy and dilation and curettage procedure.   I will order this test.  Patient had recent blood work and has normal renal function.  Cr 0.81 on 09/25/20.   Dr. Berline Lopes will see the patient post hysteroscopy surgery if a malignancy is identified.

## 2020-10-02 LAB — CYTOLOGY - PAP
Comment: NEGATIVE
Diagnosis: NEGATIVE
High risk HPV: NEGATIVE

## 2020-10-02 NOTE — Telephone Encounter (Signed)
Please try to move up date for pelvic MRI.  Patient is having patient and is needing surgery.  [place]

## 2020-10-02 NOTE — Telephone Encounter (Signed)
Per review of Epic, patient is scheduled for MRI pelvis WO contrast on 10/18/20 at 0700 at [facility].   Call placed to [facility] Main radiology scheduling, spoke with Tosha. Patient scheduled for MRI at [place] on 10/09/20 at 7pm, arriving at 6:30pm.  Nothing to eat or drink 4 hours prior to appt.   Call placed to patient. Advised as seen above. Patient thankful for call. Patient request to keep both appts for now in case a cancellation becomes available earlier at [facility]. If not, patient will call to cancel. Patient verbalizes understanding and is agreeable.   Routing to provider for final review. Patient is agreeable to disposition. Will close encounter.  Cc: Hayley Carder

## 2020-10-09 ENCOUNTER — Ambulatory Visit
Admission: RE | Admit: 2020-10-09 | Discharge: 2020-10-09 | Disposition: A | Discharge status: Home / Self Care | Payer: 59 | Source: Ambulatory Visit | Attending: Obstetrics and Gynecology | Admitting: Obstetrics and Gynecology

## 2020-10-09 ENCOUNTER — Other Ambulatory Visit: Payer: Self-pay

## 2020-10-09 DIAGNOSIS — N858 Other specified noninflammatory disorders of uterus: Secondary | ICD-10-CM | POA: Diagnosis not present

## 2020-10-10 ENCOUNTER — Telehealth: Payer: Self-pay | Admitting: Obstetrics and Gynecology

## 2020-10-10 NOTE — Telephone Encounter (Signed)
Phone call with patient to discuss pelvic MRI results.   She has a distended uterus and cervical cavity with fluid and debris.  She has cervical stenosis.   Please precert and schedule surgery for my patient.  She needs an ultrasound guided hysteroscopy with dilation and curettage and potential Myosure removal of any masses encountered.   I would like to do this surgery on 10/17/20 if possible.  She is having discomfort in her pelvis.

## 2020-10-11 NOTE — Telephone Encounter (Signed)
Spoke with patient regarding surgery benefits. Patient acknowledges understanding of information presented. Patient is aware that benefits presented are professional benefits only. Patient is aware that once surgery is scheduled, the hospital will call with separate benefits. See account note.  Routing to Jill Hamm, RN, for surgery scheduling. 

## 2020-10-11 NOTE — Telephone Encounter (Signed)
Surgery time requested for 10/17/20.

## 2020-10-12 ENCOUNTER — Other Ambulatory Visit: Payer: Self-pay | Admitting: Obstetrics and Gynecology

## 2020-10-12 ENCOUNTER — Other Ambulatory Visit (HOSPITAL_COMMUNITY): Payer: Self-pay | Admitting: Obstetrics and Gynecology

## 2020-10-12 DIAGNOSIS — N858 Other specified noninflammatory disorders of uterus: Secondary | ICD-10-CM

## 2020-10-12 NOTE — Telephone Encounter (Signed)
Spoke with patient. Surgery date request confirmed.  Advised surgery is scheduled for 10/17/20, 1130 at [facility].  Surgery instruction sheet and hospital brochure reviewed, printed copy will be picked up in office today.  Patient advised of Covid screening and quarantine requirements and agreeable.   Routing to provider. Encounter closed.   Cc: Hayley Carder

## 2020-10-13 ENCOUNTER — Encounter (HOSPITAL_COMMUNITY)
Admission: RE | Admit: 2020-10-13 | Discharge: 2020-10-13 | Disposition: A | Discharge status: Home / Self Care | Payer: 59 | Source: Ambulatory Visit | Attending: Obstetrics and Gynecology | Admitting: Obstetrics and Gynecology

## 2020-10-13 ENCOUNTER — Other Ambulatory Visit (HOSPITAL_COMMUNITY): Payer: 59

## 2020-10-13 ENCOUNTER — Other Ambulatory Visit (HOSPITAL_COMMUNITY)
Admission: RE | Admit: 2020-10-13 | Discharge: 2020-10-13 | Disposition: A | Discharge status: Home / Self Care | Payer: 59 | Source: Ambulatory Visit | Attending: Obstetrics and Gynecology | Admitting: Obstetrics and Gynecology

## 2020-10-13 ENCOUNTER — Other Ambulatory Visit: Payer: Self-pay

## 2020-10-13 ENCOUNTER — Encounter (HOSPITAL_BASED_OUTPATIENT_CLINIC_OR_DEPARTMENT_OTHER): Payer: Self-pay | Admitting: Obstetrics and Gynecology

## 2020-10-13 DIAGNOSIS — Z20822 Contact with and (suspected) exposure to covid-19: Secondary | ICD-10-CM | POA: Diagnosis not present

## 2020-10-13 DIAGNOSIS — Z01818 Encounter for other preprocedural examination: Secondary | ICD-10-CM | POA: Insufficient documentation

## 2020-10-13 LAB — BASIC METABOLIC PANEL
Anion gap: 9 (ref 5–15)
BUN: 14 mg/dL (ref 8–23)
CO2: 28 mmol/L (ref 22–32)
Calcium: 9.6 mg/dL (ref 8.9–10.3)
Chloride: 104 mmol/L (ref 98–111)
Creatinine, Ser: 0.69 mg/dL (ref 0.44–1.00)
GFR, Estimated: >60 mL/min (ref >60)
Glucose, Bld: 90 mg/dL (ref 70–99)
Potassium: 4.6 mmol/L (ref 3.5–5.1)
Sodium: 141 mmol/L (ref 135–145)

## 2020-10-13 LAB — CBC
HCT: 46.5 % — ABNORMAL HIGH (ref 36.0–46.0)
Hemoglobin: 17 g/dL — ABNORMAL HIGH (ref 12.0–15.0)
MCH: 31.6 pg (ref 26.0–34.0)
MCHC: 36.6 g/dL — ABNORMAL HIGH (ref 30.0–36.0)
MCV: 86.4 fL (ref 80.0–100.0)
Platelets: 303 10*3/uL (ref 150–400)
RBC: 5.38 MIL/uL — ABNORMAL HIGH (ref 3.87–5.11)
RDW: 13.2 % (ref 11.5–15.5)
WBC: 8.3 10*3/uL (ref 4.0–10.5)
nRBC: 0 % (ref 0.0–0.2)

## 2020-10-13 LAB — SARS CORONAVIRUS 2 (TAT 6-24 HRS): SARS Coronavirus 2: NEGATIVE

## 2020-10-13 NOTE — Progress Notes (Signed)
Spoke w/ via phone for pre-op interview--- PT Lab needs dos---- no              Lab results------ getting CBC, BMP, T&S, EKG done 10-13-2020 @ 1300 COVID test ------ 10-13-2020 @ 6195 Arrive at ------- 0930 on 10-17-2020 NPO after MN NO Solid Food.  Clear liquids from MN until--- 0830 Medications to take mo rning of surgery ----- Wellbutrin, Zoloft, Norvasc, Prilosec, Coricidin, Flonase spray Diabetic medication ----- n/a Patient Special Instructions ----- n/a Pre-Op special Istructions ----- n/a Patient verbalized understanding of instructions that were given at this phone interview. Patient denies shortness of breath, chest pain, fever, cough at this phone interview.

## 2020-10-17 ENCOUNTER — Encounter (HOSPITAL_BASED_OUTPATIENT_CLINIC_OR_DEPARTMENT_OTHER): Payer: Self-pay | Admitting: Obstetrics and Gynecology

## 2020-10-17 ENCOUNTER — Encounter (HOSPITAL_BASED_OUTPATIENT_CLINIC_OR_DEPARTMENT_OTHER)
Admission: RE | Disposition: A | Discharge status: Home / Self Care | Payer: Self-pay | Source: Home / Self Care | Attending: Obstetrics and Gynecology

## 2020-10-17 ENCOUNTER — Ambulatory Visit (HOSPITAL_COMMUNITY)
Admission: RE | Admit: 2020-10-17 | Discharge: 2020-10-17 | Disposition: A | Discharge status: Home / Self Care | Payer: 59 | Source: Ambulatory Visit | Attending: Obstetrics and Gynecology | Admitting: Obstetrics and Gynecology

## 2020-10-17 ENCOUNTER — Ambulatory Visit (HOSPITAL_BASED_OUTPATIENT_CLINIC_OR_DEPARTMENT_OTHER): Payer: 59 | Admitting: Anesthesiology

## 2020-10-17 ENCOUNTER — Ambulatory Visit (HOSPITAL_BASED_OUTPATIENT_CLINIC_OR_DEPARTMENT_OTHER)
Admission: RE | Admit: 2020-10-17 | Discharge: 2020-10-17 | Disposition: A | Discharge status: Home / Self Care | Payer: 59 | Attending: Obstetrics and Gynecology | Admitting: Obstetrics and Gynecology

## 2020-10-17 DIAGNOSIS — N858 Other specified noninflammatory disorders of uterus: Secondary | ICD-10-CM

## 2020-10-17 DIAGNOSIS — N882 Stricture and stenosis of cervix uteri: Secondary | ICD-10-CM

## 2020-10-17 DIAGNOSIS — Z881 Allergy status to other antibiotic agents status: Secondary | ICD-10-CM | POA: Insufficient documentation

## 2020-10-17 DIAGNOSIS — Z8041 Family history of malignant neoplasm of ovary: Secondary | ICD-10-CM | POA: Diagnosis not present

## 2020-10-17 DIAGNOSIS — Z79899 Other long term (current) drug therapy: Secondary | ICD-10-CM | POA: Diagnosis not present

## 2020-10-17 DIAGNOSIS — Z820 Family history of epilepsy and other diseases of the nervous system: Secondary | ICD-10-CM | POA: Diagnosis not present

## 2020-10-17 DIAGNOSIS — C541 Malignant neoplasm of endometrium: Secondary | ICD-10-CM | POA: Diagnosis not present

## 2020-10-17 DIAGNOSIS — Z888 Allergy status to other drugs, medicaments and biological substances status: Secondary | ICD-10-CM | POA: Diagnosis not present

## 2020-10-17 DIAGNOSIS — D494 Neoplasm of unspecified behavior of bladder: Secondary | ICD-10-CM | POA: Diagnosis not present

## 2020-10-17 DIAGNOSIS — Z8262 Family history of osteoporosis: Secondary | ICD-10-CM | POA: Diagnosis not present

## 2020-10-17 DIAGNOSIS — Z833 Family history of diabetes mellitus: Secondary | ICD-10-CM | POA: Insufficient documentation

## 2020-10-17 DIAGNOSIS — Z886 Allergy status to analgesic agent status: Secondary | ICD-10-CM | POA: Insufficient documentation

## 2020-10-17 DIAGNOSIS — Z8249 Family history of ischemic heart disease and other diseases of the circulatory system: Secondary | ICD-10-CM | POA: Insufficient documentation

## 2020-10-17 DIAGNOSIS — C679 Malignant neoplasm of bladder, unspecified: Secondary | ICD-10-CM

## 2020-10-17 DIAGNOSIS — C801 Malignant (primary) neoplasm, unspecified: Secondary | ICD-10-CM

## 2020-10-17 HISTORY — DX: Gastro-esophageal reflux disease without esophagitis: K21.9

## 2020-10-17 HISTORY — DX: Preglaucoma, unspecified, left eye: H40.002

## 2020-10-17 HISTORY — DX: Herpesviral infection, unspecified: B00.9

## 2020-10-17 HISTORY — DX: Unspecified osteoarthritis, unspecified site: M19.90

## 2020-10-17 HISTORY — PX: DILATATION & CURETTAGE/HYSTEROSCOPY WITH MYOSURE: SHX6511

## 2020-10-17 HISTORY — DX: Benign paroxysmal vertigo, unspecified ear: H81.10

## 2020-10-17 HISTORY — DX: Personal history of other diseases of the digestive system: Z87.19

## 2020-10-17 HISTORY — DX: Major depressive disorder, single episode, unspecified: F32.9

## 2020-10-17 HISTORY — DX: Diverticulosis of large intestine without perforation or abscess without bleeding: K57.30

## 2020-10-17 HISTORY — DX: Malignant (primary) neoplasm, unspecified: C80.1

## 2020-10-17 HISTORY — DX: Other specified noninflammatory disorders of uterus: N85.8

## 2020-10-17 HISTORY — PX: OPERATIVE ULTRASOUND: SHX5996

## 2020-10-17 HISTORY — DX: Atherosclerotic heart disease of native coronary artery without angina pectoris: I25.10

## 2020-10-17 HISTORY — DX: Personal history of urinary calculi: Z87.442

## 2020-10-17 HISTORY — DX: Frequency of micturition: R35.0

## 2020-10-17 HISTORY — PX: CYSTOSCOPY: SHX5120

## 2020-10-17 HISTORY — DX: Presence of spectacles and contact lenses: Z97.3

## 2020-10-17 HISTORY — DX: Malignant neoplasm of bladder, unspecified: C67.9

## 2020-10-17 LAB — TYPE AND SCREEN
ABO/RH(D): A POS
Antibody Screen: NEGATIVE

## 2020-10-17 LAB — ABO/RH: ABO/RH(D): A POS

## 2020-10-17 SURGERY — DILATATION & CURETTAGE/HYSTEROSCOPY WITH MYOSURE
Anesthesia: General | Site: Uterus

## 2020-10-17 MED ORDER — FENTANYL CITRATE (PF) 100 MCG/2ML IJ SOLN
INTRAMUSCULAR | Status: AC
  Filled 2020-10-17: qty 2

## 2020-10-17 MED ORDER — SODIUM CHLORIDE 0.9 % IV SOLN
INTRAVENOUS | Status: AC
  Filled 2020-10-17: qty 100

## 2020-10-17 MED ORDER — SODIUM CHLORIDE 0.9 % IV SOLN
INTRAVENOUS | Status: AC
  Filled 2020-10-17: qty 2

## 2020-10-17 MED ORDER — LIDOCAINE HCL (CARDIAC) PF 100 MG/5ML IV SOSY
PREFILLED_SYRINGE | INTRAVENOUS | Status: DC | PRN
  Administered 2020-10-17: 100 mg via INTRAVENOUS

## 2020-10-17 MED ORDER — METHYLENE BLUE 0.5 % INJ SOLN
INTRAVENOUS | Status: DC | PRN
  Administered 2020-10-17: 40 mg via INTRAVENOUS

## 2020-10-17 MED ORDER — LACTATED RINGERS IV SOLN: INTRAVENOUS | Status: DC

## 2020-10-17 MED ORDER — PROPOFOL 10 MG/ML IV BOLUS
INTRAVENOUS | Status: AC
  Filled 2020-10-17: qty 20

## 2020-10-17 MED ORDER — ACETAMINOPHEN 500 MG PO TABS
1000.0000 mg | ORAL_TABLET | ORAL | Status: AC
  Administered 2020-10-17: 1000 mg via ORAL

## 2020-10-17 MED ORDER — OXYCODONE HCL 5 MG PO TABS
ORAL_TABLET | ORAL | Status: AC
  Filled 2020-10-17: qty 1

## 2020-10-17 MED ORDER — ONDANSETRON HCL 4 MG/2ML IJ SOLN
INTRAMUSCULAR | Status: AC
  Filled 2020-10-17: qty 2

## 2020-10-17 MED ORDER — IBUPROFEN 800 MG PO TABS: 800.0000 mg | ORAL_TABLET | Freq: Three times a day (TID) | ORAL | 0 refills | Status: DC | PRN

## 2020-10-17 MED ORDER — POVIDONE-IODINE 10 % EX SWAB: 2.0000 "application " | Freq: Once | CUTANEOUS | Status: DC

## 2020-10-17 MED ORDER — ACETAMINOPHEN 500 MG PO TABS
ORAL_TABLET | ORAL | Status: AC
  Filled 2020-10-17: qty 2

## 2020-10-17 MED ORDER — MIDAZOLAM HCL 2 MG/2ML IJ SOLN
INTRAMUSCULAR | Status: AC
  Filled 2020-10-17: qty 2

## 2020-10-17 MED ORDER — ACETAMINOPHEN 325 MG PO TABS
325.0000 mg | ORAL_TABLET | ORAL | Status: DC | PRN
Start: 2020-10-17 — End: 2020-10-17

## 2020-10-17 MED ORDER — OXYCODONE HCL 5 MG/5ML PO SOLN: 5.0000 mg | Freq: Once | ORAL | Status: AC | PRN

## 2020-10-17 MED ORDER — FENTANYL CITRATE (PF) 100 MCG/2ML IJ SOLN
INTRAMUSCULAR | Status: DC | PRN
  Administered 2020-10-17 (×2): 25 ug via INTRAVENOUS
  Administered 2020-10-17 (×3): 50 ug via INTRAVENOUS

## 2020-10-17 MED ORDER — PROPOFOL 10 MG/ML IV BOLUS
INTRAVENOUS | Status: DC | PRN
  Administered 2020-10-17: 40 mg via INTRAVENOUS
  Administered 2020-10-17: 160 mg via INTRAVENOUS

## 2020-10-17 MED ORDER — OXYCODONE-ACETAMINOPHEN 5-325 MG PO TABS: 1.0000 | ORAL_TABLET | ORAL | 0 refills | Status: DC | PRN

## 2020-10-17 MED ORDER — SODIUM CHLORIDE 0.9 % IV SOLN
2.0000 g | INTRAVENOUS | Status: AC
  Administered 2020-10-17: 2 g via INTRAVENOUS

## 2020-10-17 MED ORDER — FENTANYL CITRATE (PF) 100 MCG/2ML IJ SOLN: 25.0000 ug | INTRAMUSCULAR | Status: DC | PRN

## 2020-10-17 MED ORDER — ACETAMINOPHEN 160 MG/5ML PO SOLN: 325.0000 mg | ORAL | Status: DC | PRN

## 2020-10-17 MED ORDER — KETOROLAC TROMETHAMINE 30 MG/ML IJ SOLN
INTRAMUSCULAR | Status: DC | PRN
  Administered 2020-10-17: 30 mg via INTRAVENOUS

## 2020-10-17 MED ORDER — ONDANSETRON HCL 4 MG/2ML IJ SOLN
INTRAMUSCULAR | Status: DC | PRN
  Administered 2020-10-17: 4 mg via INTRAVENOUS

## 2020-10-17 MED ORDER — MEPERIDINE HCL 25 MG/ML IJ SOLN: 6.2500 mg | INTRAMUSCULAR | Status: DC | PRN

## 2020-10-17 MED ORDER — MIDAZOLAM HCL 2 MG/2ML IJ SOLN
INTRAMUSCULAR | Status: DC | PRN
  Administered 2020-10-17: 1 mg via INTRAVENOUS

## 2020-10-17 MED ORDER — CEFOXITIN SODIUM 2 G IV SOLR
INTRAVENOUS | Status: AC
  Filled 2020-10-17: qty 2

## 2020-10-17 MED ORDER — DEXAMETHASONE SODIUM PHOSPHATE 10 MG/ML IJ SOLN
INTRAMUSCULAR | Status: DC | PRN
  Administered 2020-10-17: 5 mg via INTRAVENOUS

## 2020-10-17 MED ORDER — OXYCODONE HCL 5 MG PO TABS
5.0000 mg | ORAL_TABLET | Freq: Once | ORAL | Status: AC | PRN
  Administered 2020-10-17: 5 mg via ORAL

## 2020-10-17 MED ORDER — LIDOCAINE HCL (PF) 2 % IJ SOLN
INTRAMUSCULAR | Status: AC
  Filled 2020-10-17: qty 5

## 2020-10-17 MED ORDER — ONDANSETRON HCL 4 MG/2ML IJ SOLN: 4.0000 mg | Freq: Once | INTRAMUSCULAR | Status: DC | PRN

## 2020-10-17 MED ORDER — DEXAMETHASONE SODIUM PHOSPHATE 10 MG/ML IJ SOLN
INTRAMUSCULAR | Status: AC
  Filled 2020-10-17: qty 1

## 2020-10-17 SURGICAL SUPPLY — 30 items
BLADE SURG SZ11 CARB STEEL (BLADE) ×1 IMPLANT
CATH ROBINSON RED A/P 16FR (CATHETERS) ×4 IMPLANT
COVER WAND RF STERILE (DRAPES) ×4 IMPLANT
DEVICE MYOSURE LITE (MISCELLANEOUS) IMPLANT
DEVICE MYOSURE REACH (MISCELLANEOUS) ×1 IMPLANT
DILATOR CANAL MILEX (MISCELLANEOUS) IMPLANT
GAUZE 4X4 16PLY RFD (DISPOSABLE) ×4 IMPLANT
GLOVE SURG ENC MOIS LTX SZ6.5 (GLOVE) ×4 IMPLANT
GOWN STRL REUS W/TWL LRG LVL3 (GOWN DISPOSABLE) ×4 IMPLANT
IV NS IRRIG 3000ML ARTHROMATIC (IV SOLUTION) ×4 IMPLANT
KIT PROCEDURE FLUENT (KITS) ×4 IMPLANT
KIT TURNOVER CYSTO (KITS) ×4 IMPLANT
MYOSURE XL FIBROID (MISCELLANEOUS)
NDL SPNL 18GX3.5 QUINCKE PK (NEEDLE) IMPLANT
NEEDLE SPNL 18GX3.5 QUINCKE PK (NEEDLE) ×4 IMPLANT
PACK VAGINAL MINOR WOMEN LF (CUSTOM PROCEDURE TRAY) ×4 IMPLANT
PAD OB MATERNITY 4.3X12.25 (PERSONAL CARE ITEMS) ×4 IMPLANT
PENCIL BUTTON HOLSTER BLD 10FT (ELECTRODE) ×1 IMPLANT
SEAL CERVICAL OMNI LOK (ABLATOR) IMPLANT
SEAL ROD LENS SCOPE MYOSURE (ABLATOR) ×4 IMPLANT
SET IRRIG Y TYPE TUR BLADDER L (SET/KITS/TRAYS/PACK) ×1 IMPLANT
SUCTION FRAZIER HANDLE 10FR (MISCELLANEOUS) ×4
SUCTION TUBE FRAZIER 10FR DISP (MISCELLANEOUS) IMPLANT
SUT VIC AB 3-0 SH 27 (SUTURE) ×4
SUT VIC AB 3-0 SH 27X BRD (SUTURE) IMPLANT
SYR 50ML LL SCALE MARK (SYRINGE) ×1 IMPLANT
SYSTEM TISS REMOVAL MYOSURE XL (MISCELLANEOUS) IMPLANT
TRAP SPECIMEN MUCUS 40CC (MISCELLANEOUS) ×1 IMPLANT
TUBING CONNECTING 10 (TUBING) ×1 IMPLANT
YANKAUER SUCT BULB TIP NO VENT (SUCTIONS) ×1 IMPLANT

## 2020-10-17 NOTE — Discharge Instructions (Signed)
Hi Danielle Knapp,   I was able to create an opening in your cervix to remove old bloody fluid that had collected insideyour cervix and uterus.  The dilation of your uterus from the fluid is what caused you to have pain. I removed a mass in the right corner of your uterine cavity.  The mass is what was causing the blood to occur.  I did need to put some sutures in your cervix due to bleeding from creating the new cervical opening.   I looked inside your bladder at the end of the surgery as a safety check. I did find some small small growths.  Dr. Harold Barban, a urologist, came into the operating room to visualize the areas, and he would like to see you as a patient in his office at [facility].  I will see you in my office in one week for your post op visit.   Call if you have heavy bleeding, pain, or fever.   Josefa Half, MD  Dilation and Curettage or Vacuum Curettage, Care After This sheet gives you information about how to care for yourself after your procedure. Your doctor may also give you more specific instructions. If you have problems or questions, contact your doctor. What can I expect after the procedure? After the procedure, it is common to have:  Mild pain or cramping.  Some bleeding or spotting from the vagina. These may last for up to 2 weeks. Follow these instructions at home: Medicines  Take over-the-counter and prescription medicines only as told by your doctor. This is very important if you take blood-thinning medicine.  Ask your doctor if the medicine prescribed to you requires you to avoid driving or using machinery. Activity  If you were given a medicine to help you relax (sedative) during your procedure, it can affect you for many hours. Do not drive or use machinery until your doctor says that it is safe.  Rest as told by your doctor.  Do not sit for a long time without moving. Get up to take short walks every 1-2 hours. This is important. Ask for help if you  feel weak or unsteady.  Do not lift anything that is heavier than 10 lb (4.5 kg), or the limit that you are told, until your doctor says that it is safe.  Return to your normal activities as told by your doctor. Ask your doctor what activities are safe for you.   Lifestyle For at least 2 weeks, or as long as told by your doctor:  Do not douche.  Do not use tampons.  Do not have sex. General instructions  Wear compression stockings as told by your doctor.  It is up to you to get the results of your procedure. Ask your doctor, or the department that is doing the procedure, when your results will be ready.  Keep all follow-up visits as told by your doctor. This is important. Contact a doctor if:  You have very bad cramps that get worse or do not get better with medicine.  You have very bad pain in your belly (abdomen).  You cannot drink fluids without vomiting.  You have pain in a different part of your pelvis. The pelvis is the area just above your thighs.  You have fluid from your vagina that smells bad.  You have a rash. Get help right away if:  You are bleeding a lot from your vagina. A lot of bleeding means soaking more than one sanitary pad in  1 hour for 2 hours in a row.  You have a fever that is above 100.62F (38.0C).  Your belly feels very tender or hard.  You have chest pain.  You have trouble breathing.  You feel dizzy.  You feel light-headed.  You pass out (faint).  You have pain in your neck or shoulder area. These symptoms may be an emergency. Do not wait to see if the symptoms will go away. Get medical help right away. Call your local emergency services (911 in the [place] Do not drive yourself to the hospital. Summary  After your procedure, it is common to have pain or cramping. It is also common to have bleeding or spotting from your vagina.  Rest as told. Do not sit for a long time without moving. Get up to take short walks every 1-2  hours.  Do not lift anything that is heavier than 10 lb (4.5 kg), or the limit that you are told.  Contact your doctor if you have fluid from your vagina that smells bad.  Get help right away if you develop any problems from the procedure. Ask your doctor what problems to watch for. This information is not intended to replace advice given to you by your health care provider. Make sure you discuss any questions you have with your health care provider. Document Revised: 09/21/2019 Document Reviewed: 09/21/2019 Elsevier Patient Education  2021 [place]. Hysteroscopy Hysteroscopy is a procedure used to look inside a woman's womb (uterus). This may be done for various reasons, including:  To look for tumors and other growths in the uterus.  To evaluate abnormal bleeding, fibroid tumors, polyps, scar tissue, or uterine cancer.  To determine why a woman is unable to get pregnant or has had repeated pregnancy losses.  To locate an IUD (intrauterine device).  To place a birth control device into the fallopian tubes. During this procedure, a thin, flexible tube with a small light and camera (hysteroscope) is used to examine the uterus. The camera sends images to a monitor in the room so that your health care provider can view the inside of your uterus. A hysteroscopy should be done right after a menstrual period. Tell a health care provider about:  Any allergies you have.  All medicines you are taking, including vitamins, herbs, eye drops, creams, and over-the-counter medicines.  Any problems you or family members have had with anesthetic medicines.  Any blood disorders you have.  Any surgeries you have had.  Any medical conditions you have.  Whether you are pregnant or may be pregnant.  Whether you have been diagnosed with an STI (sexually transmitted infection) or you think you have an STI. What are the risks? Generally, this is a safe procedure. However, problems may occur,  including:  Excessive bleeding.  Infection.  Damage to the uterus or other structures or organs.  Allergic reaction to medicines or fluids that are used in the procedure. What happens before the procedure? Staying hydrated Follow instructions from your health care provider about hydration, which may include:  Up to 2 hours before the procedure - you may continue to drink clear liquids, such as water, clear fruit juice, black coffee, and plain tea. Eating and drinking restrictions Follow instructions from your health care provider about eating and drinking, which may include:  8 hours before the procedure - stop eating solid foods and drink clear liquids only.  2 hours before the procedure - stop drinking clear liquids. Medicines  Ask your health care  provider about: ? Changing or stopping your regular medicines. This is especially important if you are taking diabetes medicines or blood thinners. ? Taking medicines such as aspirin and ibuprofen. These medicines can thin your blood. Do not take these medicines unless your health care provider tells you to take them. ? Taking over-the-counter medicines, vitamins, herbs, and supplements.  Medicine may be placed in your cervix the day before the procedure. This medicine causes the cervix to open (dilate). The larger opening makes it easier for the hysteroscope to be inserted into the uterus during the procedure. General instructions  Ask your health care provider: ? What steps will be taken to help prevent infection. These steps may include:  Washing skin with a germ-killing soap.  Taking antibiotic medicine.  Do not use any products that contain nicotine or tobacco for at least 4 weeks before the procedure. These products include cigarettes, chewing tobacco, and vaping devices, such as e-cigarettes. If you need help quitting, ask your health care provider.  Plan to have a responsible adult take you home from the hospital or  clinic.  Plan to have a responsible adult care for you for the time you are told after you leave the hospital or clinic. This is important.  Empty your bladder before the procedure begins. What happens during the procedure?  An IV will be inserted into one of your veins.  You may be given: ? A medicine to help you relax (sedative). ? A medicine that numbs the area around the cervix (local anesthetic). ? A medicine to make you fall asleep (general anesthetic).  A hysteroscope will be inserted through your vagina and into your uterus.  Air or fluid will be used to enlarge your uterus to allow your health care provider to see it better. The amount of fluid used will be carefully checked throughout the procedure.  In some cases, tissue may be gently scraped from inside the uterus and sent to a lab for testing (biopsy). The procedure may vary among health care providers and hospitals. What can I expect after the procedure?  Your blood pressure, heart rate, breathing rate, and blood oxygen level will be monitored until you leave the hospital or clinic.  You may have cramps. You may be given medicines for this.  You may have bleeding, which may vary from light spotting to menstrual-like bleeding. This is normal.  If you had a biopsy, it is up to you to get the results. Ask your health care provider, or the department that is doing the procedure, when your results will be ready. Follow these instructions at home: Activity  Rest as told by your health care provider.  Return to your normal activities as told by your health care provider. Ask your health care provider what activities are safe for you.  If you were given a sedative during the procedure, it can affect you for several hours. Do not drive or operate machinery until your health care provider says that it is safe. Medicines  Do not take aspirin or other NSAIDs during recovery, as told by your healthcare provider. It can  increase the risk of bleeding.  Ask your health care provider if the medicine prescribed to you: ? Requires you to avoid driving or using machinery. ? Can cause constipation. You may need to take these actions to prevent or treat constipation:  Drink enough fluid to keep your urine pale yellow.  Take over-the-counter or prescription medicines.  Eat foods that are high in fiber,  such as beans, whole grains, and fresh fruits and vegetables.  Limit foods that are high in fat and processed sugars, such as fried or sweet foods. General instructions  Do not douche, use tampons, or have sex for 2 weeks after the procedure, or until your health care provider approves.  Do not take baths, swim, or use a hot tub until your health care provider approves. Take showers instead of baths for 2 weeks, or for as long as told by your health care provider.  Keep all follow-up visits. This is important. Contact a health care provider if:  You feel dizzy or lightheaded.  You feel nauseous.  You have abnormal vaginal discharge.  You have a rash.  You have pain that does not get better with medicine.  You have chills. Get help right away if:  You have bleeding that is heavier than a normal menstrual period.  You have a fever.  You have pain or cramps that get worse.  You develop new abdominal pain.  You faint.  You have pain in your shoulder.  You are short of breath. Summary  Hysteroscopy is a procedure that is used to look inside a woman's womb (uterus).  After the procedure, you may have bleeding, which varies from light spotting to menstrual-like bleeding. This is normal. You may also have cramps.  Do not douche, use tampons, or have sex for 2 weeks after the procedure, or until your health care provider approves.  Plan to have a responsible adult take you home from the hospital or clinic. This information is not intended to replace advice given to you by your health care  provider. Make sure you discuss any questions you have with your health care provider. Document Revised: 04/05/2020 Document Reviewed: 04/05/2020 Elsevier Patient Education  2021 [place] Instructions  Activity: Get plenty of rest for the remainder of the day. A responsible individual must stay with you for 24 hours following the procedure.  For the next 24 hours, DO NOT: -Drive a car -Paediatric nurse -Drink alcoholic beverages -Take any medication unless instructed by your physician -Make any legal decisions or sign important papers.  Meals: Start with liquid foods such as gelatin or soup. Progress to regular foods as tolerated. Avoid greasy, spicy, heavy foods. If nausea and/or vomiting occur, drink only clear liquids until the nausea and/or vomiting subsides. Call your physician if vomiting continues.  Special Instructions/Symptoms: Your throat may feel dry or sore from the anesthesia or the breathing tube placed in your throat during surgery. If this causes discomfort, gargle with warm salt water. The discomfort should disappear within 24 hours.

## 2020-10-17 NOTE — Anesthesia Preprocedure Evaluation (Addendum)
Anesthesia Evaluation  Patient identified by MRN, date of birth, ID band Patient awake    Reviewed: Allergy & Precautions, H&P , NPO status , Patient's Chart, lab work & pertinent test results, reviewed documented beta blocker date and time   Airway Mallampati: II  TM Distance: >3 FB Neck ROM: full    Dental no notable dental hx. (+) Teeth Intact, Dental Advisory Given, Caps   Pulmonary neg pulmonary ROS,    Pulmonary exam normal breath sounds clear to auscultation       Cardiovascular Exercise Tolerance: Good hypertension, Pt. on medications negative cardio ROS   Rhythm:regular Rate:Normal     Neuro/Psych PSYCHIATRIC DISORDERS Depression negative neurological ROS     GI/Hepatic Neg liver ROS, GERD  Medicated and Controlled,  Endo/Other  negative endocrine ROS  Renal/GU negative Renal ROS  negative genitourinary   Musculoskeletal   Abdominal   Peds  Hematology negative hematology ROS (+)   Anesthesia Other Findings   Reproductive/Obstetrics negative OB ROS                            Anesthesia Physical Anesthesia Plan  ASA: II  Anesthesia Plan: General   Post-op Pain Management:    Induction:   PONV Risk Score and Plan: 3 and Ondansetron and Dexamethasone  Airway Management Planned: LMA  Additional Equipment:   Intra-op Plan:   Post-operative Plan:   Informed Consent: I have reviewed the patients History and Physical, chart, labs and discussed the procedure including the risks, benefits and alternatives for the proposed anesthesia with the patient or authorized representative who has indicated his/her understanding and acceptance.     Dental Advisory Given  Plan Discussed with: CRNA and Anesthesiologist  Anesthesia Plan Comments:         Anesthesia Quick Evaluation

## 2020-10-17 NOTE — Consult Note (Signed)
Urology Consult   Physician requesting consult: Dr Quincy Simmonds  Reason for consult: Incidental cystoscopic bladder findings  History of Present Illness: Danielle Knapp is a 65 y.o. patient undergoing endometrial tumor resection by gynecology.  They were performing cystoscopy to assess ureteral orifice drainage.  During cystoscopy was noted to have incidental finding of what appears to be a small exophytic papillary bladder tumor.  I was asked to step into the operating room for confirmation and opinion regarding management.  Patient currently asleep, patient undergoing cystoscopy and Dr. Quincy Simmonds showed me what appears to be a 1 cm small exophytic papillary tumor just proximal to the left ureteral orifice on the trigonal area.  There is also a very small frondular lesion in the right trigonal area distal to the right ureteral orifice.  No other lesions noted there is no hematuria or bleeding noted.  There is clear E flux from the ureteral orifice ease.  Neither of the lesions appear to involve the ureteral orifice ease.    Past Medical History:  Diagnosis Date  . Allergic rhinitis   . Borderline glaucoma, left    no eye drops  . BPPV (benign paroxysmal positional vertigo)   . Chondromalacia of patella    left  . Coronary artery disease    cardiac cath 11-03-2017 done for positive ETT,  showed very miminal nonobstrutive cad, ef 55-65%, non caridac chest pain  . Diverticulosis of colon   . Frequency of urination   . GERD (gastroesophageal reflux disease)   . History of chest pain 10/14/2017   ED visit w/ admission,  hypertensive urgency;  follow up with dr Doylene Canard--- had outpatient ETT 10-21-2017 positive ischemia, echo 10-16-2017 G1DD, 60-65%;    s/p cardiac cath 11-03-2017 w/ very miminal nonobstructive cad , chest pain non cardiac  . History of diverticulitis   . History of kidney stones   . HSV-1 infection    fever blisters  . Hx of adenomatous colonic polyps 02/08/2018  . Hypertension     followed by pcp  . MDD (major depressive disorder)   . OA (osteoarthritis)    left knee, shoulder  . Osteoporosis   . Polycythemia    long hx followed by pcp and  sees hematology/ oncology--- dr m. Julien Nordmann on as needed basis, chronic and told to donate blood regularly  . Stenosis of cervix   . Uterine mass   . Wears glasses     Past Surgical History:  Procedure Laterality Date  . CATARACT EXTRACTION W/ INTRAOCULAR LENS  IMPLANT, BILATERAL  2013  . COLONOSCOPY  02-02-2018 dr Carlean Purl  . DIAGNOSTIC LAPAROSCOPY  1996  . HEEL SPUR SURGERY Right 2015   Dr. Durward Fortes  . HEMORRHOID SURGERY  2004  . LEFT HEART CATH AND CORONARY ANGIOGRAPHY N/A 11/03/2017   Procedure: LEFT HEART CATH AND CORONARY ANGIOGRAPHY;  Surgeon: Jettie Booze, MD;  Location: [facility];  Service: Cardiovascular;  Laterality: N/A;  . SEPTOPLASTY  x2 1970's  . TONSILLECTOMY  age 9     Current Hospital Medications:  Home meds:  No current facility-administered medications on file prior to encounter.   Current Outpatient Medications on File Prior to Encounter  Medication Sig Dispense Refill  . amLODipine (NORVASC) 2.5 MG tablet Take 2.5 mg by mouth daily.  1  . buPROPion (WELLBUTRIN XL) 300 MG 24 hr tablet Take 300 mg by mouth daily.    . Calcium Carb-Cholecalciferol 600-200 MG-UNIT TABS Take 1 tablet by mouth at bedtime.    Marland Kitchen  Chlorphen-Pseudoephed-APAP (CORICIDIN D PO) Take 1 tablet by mouth daily.    . fluticasone (FLONASE) 50 MCG/ACT nasal spray Place 2 sprays into both nostrils daily.    Marland Kitchen losartan (COZAAR) 100 MG tablet Take 100 mg by mouth daily.    . Multiple Vitamin (MULTIVITAMIN) tablet Take 1 tablet by mouth at bedtime.    Marland Kitchen omeprazole (PRILOSEC) 40 MG capsule Take 40 mg by mouth daily.    . sertraline (ZOLOFT) 100 MG tablet Take 150 mg by mouth daily.     . valACYclovir (VALTREX) 1000 MG tablet Take 2,000 mg by mouth 2 (two) times daily as needed (for fever blisters).     . zolpidem  (AMBIEN) 10 MG tablet Take 10 mg by mouth at bedtime as needed.       Scheduled Meds: . povidone-iodine  2 application Topical Once   Continuous Infusions: . lactated ringers 125 mL/hr at 10/17/20 1133  . lactated ringers     PRN Meds:.  Allergies:  Allergies  Allergen Reactions  . Ciprofloxacin Hcl Anaphylaxis and Rash  . Erythromycin Diarrhea  . Flagyl [Metronidazole] Anaphylaxis and Rash  . Nsaids Cough    Can tolerate Ibuprofen.   . Lisinopril Cough    Family History  Problem Relation Age of Onset  . Osteoporosis Mother   . Heart attack Mother   . Alzheimer's disease Mother   . Diabetes Father   . Hypertension Father   . Heart failure Father   . Ovarian cancer Sister   . Stroke Maternal Grandmother   . Colon cancer Neg Hx   . Colon polyps Neg Hx   . Esophageal cancer Neg Hx   . Rectal cancer Neg Hx   . Stomach cancer Neg Hx     Social History:  reports that she has never smoked. She has never used smokeless tobacco. She reports current alcohol use. She reports that she does not use drugs.  ROS: A complete review of systems was performed.  All systems are negative except for pertinent findings as noted.  Physical Exam:  Patient currently under general anesthesia asleep Laboratory Data:  No results for input(s): WBC, HGB, HCT, PLT in the last 72 hours.  No results for input(s): NA, K, CL, GLUCOSE, BUN, CALCIUM, CREATININE in the last 72 hours.  Invalid input(s): CO3   Results for orders placed or performed during the hospital encounter of 10/17/20 (from the past 24 hour(s))  ABO/Rh     Status: None   Collection Time: 10/17/20 10:03 AM  Result Value Ref Range   ABO/RH(D)      A POS Performed at [facility], [place] [address] [place], [place] [address]    Recent Results (from the past 240 hour(s))  SARS CORONAVIRUS 2 (TAT 6-24 HRS) Nasopharyngeal Nasopharyngeal Swab     Status: None   Collection Time: 10/13/20  2:27 PM   Specimen:  Nasopharyngeal Swab  Result Value Ref Range Status   SARS Coronavirus 2 NEGATIVE NEGATIVE Final    Comment: (NOTE) SARS-CoV-2 target nucleic acids are NOT DETECTED.  The SARS-CoV-2 RNA is generally detectable in upper and lower respiratory specimens during the acute phase of infection. Negative results do not preclude SARS-CoV-2 infection, do not rule out co-infections with other pathogens, and should not be used as the sole basis for treatment or other patient management decisions. Negative results must be combined with clinical observations, patient history, and epidemiological information. The expected result is Negative.  Fact Sheet for Patients: SugarRoll.be  Fact Sheet for Healthcare Providers: https://www.woods-mathews.com/  This test is not yet approved or cleared by the [place] FDA and  has been authorized for detection and/or diagnosis of SARS-CoV-2 by FDA under an Emergency Use Authorization (EUA). This EUA will remain  in effect (meaning this test can be used) for the duration of the COVID-19 declaration under Se ction 564(b)(1) of the Act, 21 U.S.C. section 360bbb-3(b)(1), unless the authorization is terminated or revoked sooner.  Performed at [facility], [place] [address], [place],  [address]     Renal Function: Recent Labs    10/13/20 1327  CREATININE 0.69   Estimated Creatinine Clearance: 78.1 mL/min (by C-G formula based on SCr of 0.69 mg/dL).  Radiologic Imaging: No results found.  I independently reviewed the above imaging studies.  Impression/Recommendation: Probable small exophytic papillary urothelial tumor Recommendation: Patient will be scheduled for outpatient follow-up to discuss management which are likely mean cystoscopy and bladder biopsy with fulguration under anesthesia along with retrograde pyelogram.  Dr. Quincy Simmonds to arrange follow-up with me as outpatient.  Remi Haggard 10/17/2020, 1:22 PM     CC: Dr Quincy Simmonds

## 2020-10-17 NOTE — Transfer of Care (Signed)
Immediate Anesthesia Transfer of Care Note  Patient: Danielle Knapp  Procedure(s) Performed: Procedure(s) (LRB): DILATATION & CURETTAGE/HYSTEROSCOPY WITH  MYOSURE RESECTION OF ENDOMETRIAL MASS,  OPENING OF CERVICAL STENOSIS, DRAINAGE OF UTERINE FLUID (N/A) OPERATIVE ULTRASOUND (N/A) CYSTOSCOPY  Patient Location: PACU  Anesthesia Type: General  Level of Consciousness: awake, oriented, sedated and patient cooperative  Airway & Oxygen Therapy: Patient Spontanous Breathing and Patient connected to face mask oxygen  Post-op Assessment: Report given to PACU RN and Post -op Vital signs reviewed and stable  Post vital signs: Reviewed and stable  Complications: No apparent anesthesia complications Last Vitals:  Vitals Value Taken Time  BP 168/157 10/17/20 1331  Temp 36.4 C 10/17/20 1330  Pulse 80 10/17/20 1333  Resp 17 10/17/20 1333  SpO2 99 % 10/17/20 1333  Vitals shown include unvalidated device data.  Last Pain:  Vitals:   10/17/20 1000  TempSrc: Oral  PainSc: 3       Patients Stated Pain Goal: 5 (37/34/28 7681)  Complications: No complications documented.

## 2020-10-17 NOTE — H&P (Signed)
Office Visit  09/28/2020 [facility], MD  Obstetrics and Gynecology  Uterine mass +2 more  Dx  Procedure ; Referred by Harlan Stains, MD  Reason for Visit    Additional Documentation  Vitals:  BP 144/84 Important   (Cuff Size: Large)  Pulse 68  Ht 5' 6.5" (1.689 m)  Wt 81.6 kg  LMP 09/03/2011  SpO2 99%  BMI 28.62 kg/m  BSA 1.96 m    More Vitals  Flowsheets:  Anthropometrics,  NEWS,  MEWS Score,  Method of Visit    Encounter Info:  Billing Info,  History,  Allergies,  Detailed Report     Orthostatic Vitals Recorded in This Encounter   09/28/2020  1634     Cuff Size: Large   All Notes    Progress Notes by Nunzio Cobbs, MD at 09/28/2020 4:30 PM  Author: Nunzio Cobbs, MD Author Type: Physician Filed: 09/29/2020  1:44 PM  Note Status: Signed Cosign: Cosign Not Required Encounter Date: 09/28/2020  Editor: Nunzio Cobbs, MD (Physician)      Prior Versions: 1. Nunzio Cobbs, MD (Physician) at 09/29/2020  1:33 PM - Sign when Signing Visit   2. Lowella Fairy, CMA (Certified Psychologist, sport and exercise) at 09/28/2020  5:01 PM - Sign when Signing Visit    GYNECOLOGY  VISIT   HPI: 65 y.o.   Married  Caucasian  female   G0P0000 with Patient's last menstrual period was 09/03/2011.   here for Pelvic ultrasound for uterine mass detected on CT scan.    GYNECOLOGIC HISTORY: Patient's last menstrual period was 09/03/2011. Contraception:  PMP Menopausal hormone therapy:  none Last mammogram:  12-24-19 3D/stable benign appearing calcifications Lt.Br./Neg/density C/BiRads2 Last pap smear: 09-27-20 pend.,03-20-18 Neg:Neg HR HPV,02-21-15 Neg:Neg HR HPV, 01/27/13 - Neg;Neg HR HPV.                OB History     Gravida  0   Para  0   Term  0   Preterm  0   AB  0   Living  0      SAB  0   IAB  0   Ectopic  0   Multiple  0   Live Births                         Patient Active Problem List    Diagnosis Date Noted  . Hx of adenomatous colonic polyps 02/08/2018  . Chest pain 10/15/2017  . Hypertensive urgency 10/15/2017  . Depression    . Hypertension    . Polycythemia 08/11/2012          Past Medical History:  Diagnosis Date  . Allergic rhinitis    . Allergy    . Arthritis of shoulder    . Cataracts, bilateral      removed with lens implants   . Chest pressure    . Chondromalacia of patella    . Depression    . Diverticulitis    . Glaucoma      BORDERLINE IN LEFT EYE  . Heel spur    . HSV infection      TYPE 1 IN HER NOSE,LIP  . Hx of adenomatous colonic polyps 02/08/2018  . Hypertension    . Infertility, female    . Kidney stones 1/11    spontaneously passed dx'd based on  blood test & history of cancer  . Mallet finger      LEFT INDEX FINGER  . Osteoarthritis, knee    . Osteoporosis    . Plantar fasciitis    . Plantar fasciitis of left foot    . Polycythemia    . Situational stress    . Stenosis of cervix    . Stress fracture of foot 02/2020    right foot  . Tennis elbow      -right           Past Surgical History:  Procedure Laterality Date  . ANAL SPHINCTEROPLASTY   2004    hemorrhoids  . CATARACT EXTRACTION, BILATERAL   2013  . COLONOSCOPY   2009    [facility]- Tics only   . DIAGNOSTIC LAPAROSCOPY   1996  . FINGER SURGERY      . HEEL SPUR SURGERY Right 2015    Dr. Durward Fortes  . LEFT HEART CATH AND CORONARY ANGIOGRAPHY N/A 11/03/2017    Procedure: LEFT HEART CATH AND CORONARY ANGIOGRAPHY;  Surgeon: Jettie Booze, MD;  Location: [facility];  Service: Cardiovascular;  Laterality: N/A;  . SEPTOPLASTY   1970's    x2  . TONSILLECTOMY                Current Outpatient Medications  Medication Sig Dispense Refill  . amLODipine (NORVASC) 2.5 MG tablet TK 1 T PO QD   1  . buPROPion (WELLBUTRIN XL) 300 MG 24 hr tablet Take by mouth.      . Calcium Carb-Cholecalciferol 600-200 MG-UNIT TABS  Take 1 tablet by mouth daily.      . Chlorphen-Pseudoephed-APAP (CORICIDIN D PO) Take 1 tablet by mouth daily.      . fluticasone (FLONASE) 50 MCG/ACT nasal spray SPRAY 2 SPRAYS TWICE DAILY IN EACH NOSTRIL EVERY DAY      . losartan (COZAAR) 100 MG tablet Take 100 mg by mouth daily.      Marland Kitchen omeprazole (PRILOSEC) 40 MG capsule Take 40 mg by mouth daily.      . sertraline (ZOLOFT) 100 MG tablet Take 150 mg by mouth daily.       . valACYclovir (VALTREX) 1000 MG tablet Take 2,000 mg by mouth 2 (two) times daily as needed (for fever blisters).       . zolpidem (AMBIEN) 10 MG tablet Take 10 mg by mouth at bedtime as needed.        No current facility-administered medications for this visit.      ALLERGIES: Ciprofloxacin hcl, Erythromycin, Flagyl [metronidazole], Nsaids, Ace inhibitors, Lisinopril, and Lunesta [eszopiclone]        Family History  Problem Relation Age of Onset  . Osteoporosis Mother    . Heart attack Mother    . Alzheimer's disease Mother    . Diabetes Father    . Hypertension Father    . Heart failure Father    . Ovarian cancer Sister    . Stroke Maternal Grandmother    . Colon cancer Neg Hx    . Colon polyps Neg Hx    . Esophageal cancer Neg Hx    . Rectal cancer Neg Hx    . Stomach cancer Neg Hx        Social History         Socioeconomic History  . Marital status: Married      Spouse name: Not on file  . Number of children: Not on file  . Years of  education: Not on file  . Highest education level: Not on file  Occupational History  . Not on file  Tobacco Use  . Smoking status: Never Smoker  . Smokeless tobacco: Never Used  Vaping Use  . Vaping Use: Never used  Substance and Sexual Activity  . Alcohol use: Yes      Alcohol/week: 3.0 standard drinks      Types: 3 Glasses of wine per week      Comment: or less  . Drug use: No  . Sexual activity: Not Currently      Partners: Male      Birth control/protection: Post-menopausal      Comment: husband with  prostate cancer  Other Topics Concern  . Not on file  Social History Narrative  . Not on file    Social Determinants of Health    Financial Resource Strain: Not on file  Food Insecurity: Not on file  Transportation Needs: Not on file  Physical Activity: Not on file  Stress: Not on file  Social Connections: Not on file  Intimate Partner Violence: Not on file      Review of Systems  All other systems reviewed and are negative.     PHYSICAL EXAMINATION:     BP (!) 144/84 (Cuff Size: Large)   Pulse 68   Ht 5' 6.5" (1.689 m)   Wt 180 lb (81.6 kg)   LMP 09/03/2011   SpO2 99%   BMI 28.62 kg/m     General appearance: alert, cooperative and appears stated age Head: Normocephalic, without obvious abnormality, atraumatic Lungs: clear to auscultation bilaterally Heart: regular rate and rhythm Abdomen: soft, non-tender, no masses,  no organomegaly Extremities: extremities normal, atraumatic, no cyanosis or edema Skin: Skin color, texture, turgor normal. No rashes or lesions   Pelvic ultrasound - transabdominal and transvaginal.    Uterus with large fluid collection extending from uterus into the cervical canal.  Size of fluid collection is 9.5 x 7.6 x 6.3 cm, approx 240 cc.  There is a 23 x 9 mm intracavitary mass with a feeder vessel.  Left ovary is small and normal  Right ovary not seen.  No adnexal mass.  No free fluid.   ASSESSMENT   Large uterine fluid collection.  Endometrial mass.  Cervical stenosis.    PLAN   Pelvic ultrasound images and report discussed.   We discussed options for care - hysteroscopy with drainage of uterine fluid and resection of endometrial mass, dilation and curettage versus direct referral to [facility] for potential hysterectomy.   Risks of hysteroscopy with dilation and curettage include but are not limited to bleeding, infection, damage to surrounding organs including uterine perforation requiring hospitalization and laparoscopy, pulmonary  edema, reaction to anesthesia, DVT, PE, death, need for further treatment and surgery.   Surgical recovery discussed.    I will reach out to GYN ONC to discuss the case and finalize a plan.    FU pap.    32 min total time was spent for this patient encounter, including preparation, face-to-face counseling with the patient, coordination of care, and documentation of the encounter.          ADDENDUM  GYN ONC recommended pelvic MRI which showed distended uterus and cervical cavity with fluid and debris.   Will proceed with ultrasound guided hysteroscopy with drainage of uterine fluid, resection of endometrial mass, and dilation and curettage.

## 2020-10-17 NOTE — Op Note (Signed)
OPERATIVE REPORT  PREOPERATIVE DIAGNOSES:   Uterine mass, abnormal collection of fluid in endometrial cavity, cervical stenosis.  POSTOPERATIVE DIAGNOSES:  Uterine mass, abnormal collection of fluid in endometrial cavity, cervical stenosis, bladder tumor    PROCEDURE:  Ultrasound guided opening of cervical stenosis, Myosure resection of endometrial mass, dilation and curettage, cystoscopy.  SURGEON:  Lenard Galloway, MD  INTRAOPERATIVE CONSULTATION:   Harold Barban, MD  ANESTHESIA:  LMA, paracervical block with 10 mL of 1% lidocaine.  IV FLUIDS:  1600 cc LR  EBL:  50 cc  URINE OUTPUT:   200 cc  NORMAL SALINE DEFICIT:   450 cc.   COMPLICATIONS:  None.  INDICATIONS FOR THE PROCEDURE:     The patient is a 65 year old G59P0 Caucasian female who presented with pelvic and CT scan showing a large uterine fluid collection with an endometrial mass.   Pelvic ultrasound in follow up demonstrated a fluid collection measuring 9.5 x 7.6 x 6.3 cm in the uterus and extending into the cervical canal.  There was also a 23 x 9 cm intracavitary mass with a feeder vessel noted.  The patient had cervical stenosis on pelvic exam.  MRI documented a distended uterus with polypoid debris.  A plan is now made to proceed with an ultrasound guided hysteroscopy with dilation and curettage and Myosurgical resection of endometrial mass; after risks, benefits and alternatives were reviewed.  FINDINGS:  Exam under anesthesia revealed 10 cm size uterus.  The lower uterine segment felt distended.  No adnexal masses were noted.  There was no visible cervical os or well recognized cervix.   Once entry into the cervix was possible, the thickness of the cervical tissue was noted to be approximately 4 - 5 mm. The uterus was filled with dark blood and mucous reminiscent of an endometrioma type liquid.  Approximately 250 cc were removed. Hysteroscopy showed a 1.5 polypoid vascular mass of the right cornual region.  The left  cornual region was not distinctly visualized.  There were small endometrial masses similar in appearance to submucous fibroids of about 2 mm in size in scattered locations.  Endometrial currettings were scant.  Cystoscopy revealed a polypoid tumor near the left ureteral orifice and the right ureteral orifice.  The left polypoid tumor was approximately 1 cm and the right side was a few millimeters.  Both ureters were patent bilaterally after the injection of indigo carmine dye.  The bladder was intact throughout 360 degrees.  The urethra was unremarkable.   SPECIMENS:   The endometrial fluid was sent for cytology and aerobic and anaerobic culture.  The endometrial mass was sent to pathology separately from the endometrial curettings.   PROCEDURE IN DETAIL:  The patient was reidentified in the preoperative hold area.  She received TED hose and PAS stockings for DVT prophylaxis. She received Cefotetan for IV antibiotic prophylaxis.   In the operating room, the patient was placed in the dorsal lithotomy position and then an LMA anesthetic was introduced.  The patient's lower abdomen, vagina and perineum were sterilely prepped with Betadine and the  patient's bladder was catheterized of urine.  She was sterilly draped  An exam under anesthesia was performed.  See the findings above.   Transabdominal ultrasound was performed and continued throughout the procedure.   A weighted speculum was placed inside the vagina and the vaginal cuff was visualized.   The bladder was retrograde filled with normal saline to outline the bladder.  Vaginal and then rectal exam were  then performed with ultrasound guidance to define the proximity to the cervix and fluid in the uterine canal.  An Alis clamp was placed over the area of expected cervix.  An 18 gauge needle was used to enter into the uterine fluid in the cervix area.  Dark old mucousy blood was noted.   The opening was enlarged a few mm using an 11  blade. An Alis clamp was placed on the posterior cervical os opening.   The remaining blood was drained from the uterus.  The MyoSure hysteroscope was then inserted inside the uterine cavity under the continuous infusion of normal saline solution.   Findings are as noted above.   The Myosure Reach device was used to resect the endometrial polyp, which was sent to pathology.  A sharp curette was introduced into the uterine cavity and the endometrium was curetted gently. A minimal amount of endometrial curettings was obtained.  This specimen was sent to Pathology.  The neocervix was bleeding from the edges and this was treated with monopolar cautery.  The edges of the neocervix were then sutured with a running locked suture of 3/0 Vicryl to create hemostasis.   There was a small amount of bright red blood noted coming from the os indicating patency.   The foley catheter was removed and cystoscopy was performed.  See the findings above.  Dr. Milford Cage provided intraoperative consultation to visualize the bladder tumors.  He recommended follow up in his office.  The patient was awakened and escorted to the recovery room in stable condition after she was cleansed of Betadine. There were no complications to the procedure.    All needle, instrument and sponge counts were correct.  Lenard Galloway, MD

## 2020-10-17 NOTE — Anesthesia Procedure Notes (Signed)
Procedure Name: LMA Insertion Date/Time: 10/17/2020 11:40 AM Performed by: Raenette Rover, CRNA Pre-anesthesia Checklist: Patient identified, Emergency Drugs available, Suction available and Patient being monitored Patient Re-evaluated:Patient Re-evaluated prior to induction Oxygen Delivery Method: Circle system utilized Preoxygenation: Pre-oxygenation with 100% oxygen Induction Type: IV induction LMA: LMA inserted LMA Size: 4.0 Number of attempts: 1 Placement Confirmation: positive ETCO2 and breath sounds checked- equal and bilateral Tube secured with: Tape Dental Injury: Teeth and Oropharynx as per pre-operative assessment

## 2020-10-18 ENCOUNTER — Telehealth: Payer: Self-pay | Admitting: Obstetrics and Gynecology

## 2020-10-18 ENCOUNTER — Encounter (HOSPITAL_BASED_OUTPATIENT_CLINIC_OR_DEPARTMENT_OTHER): Payer: Self-pay | Admitting: Obstetrics and Gynecology

## 2020-10-18 ENCOUNTER — Other Ambulatory Visit: Payer: 59

## 2020-10-18 LAB — SURGICAL PATHOLOGY

## 2020-10-18 LAB — CYTOLOGY - NON PAP

## 2020-10-18 NOTE — Anesthesia Postprocedure Evaluation (Signed)
Anesthesia Post Note  Patient: Danielle Knapp  Procedure(s) Performed: DILATATION & CURETTAGE/HYSTEROSCOPY WITH  MYOSURE RESECTION OF ENDOMETRIAL MASS,  OPENING OF CERVICAL STENOSIS, DRAINAGE OF UTERINE FLUID (N/A ) OPERATIVE ULTRASOUND (N/A Uterus) CYSTOSCOPY (Bladder)     Patient location during evaluation: PACU Anesthesia Type: General Level of consciousness: awake and alert Pain management: pain level controlled Vital Signs Assessment: post-procedure vital signs reviewed and stable Respiratory status: spontaneous breathing, nonlabored ventilation and respiratory function stable Cardiovascular status: blood pressure returned to baseline and stable Postop Assessment: no apparent nausea or vomiting Anesthetic complications: no   No complications documented.  Last Vitals:  Vitals:   10/17/20 1400 10/17/20 1515  BP: (!) 144/81 137/73  Pulse: 72 79  Resp: 14 16  Temp:  36.6 C  SpO2: 97%     Last Pain:  Vitals:   10/17/20 1515  TempSrc:   PainSc: [name]

## 2020-10-18 NOTE — Telephone Encounter (Signed)
Phone call to patient in follow up to surgery yesterday.   She states her pelvic pain is now essentially gone.  She took on Percocet last night.  Having light vaginal bleeding.   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 She also had intraoperative findings of a bladder tumor noted at time of cystoscopy.  Urologist Dr. Harold Barban performed intraop consultation.   Please make a referral to Dr. Valarie Cones, GYN ONC at the [facility].   Please make a referral to Dr. Harold Barban at [facility].   She will keep her post op appointment for next week.  I recommend no work for 3 weeks due to surgical recovery and need for her upcoming appointments.   She will bring in new FMLA papers to be filled out.

## 2020-10-20 ENCOUNTER — Telehealth: Payer: Self-pay | Admitting: *Deleted

## 2020-10-20 NOTE — Telephone Encounter (Signed)
Message left on Triage voice mail at [facility] [phone] for appointment with Dr Milford Cage.  Call to patient. Advised to expect call from [facility] directly for appointment with Dr Berline Lopes. Will call back with update when available from [facility].

## 2020-10-20 NOTE — Telephone Encounter (Signed)
Call to [facility] and appointment requested with Dr Berline Lopes. They will review and call patient directly with appointment.

## 2020-10-20 NOTE — Telephone Encounter (Signed)
Call back from [facility]) appointment with Dr Milford Cage on 10-25-20 at 1045, arrive 1030. Patient notified and agreeable. Phone number given.She is aware of directions to office.

## 2020-10-20 NOTE — Telephone Encounter (Signed)
Called and scheduled the patient for a new patient appt. Patient scheduled for 3/4 at 11:15 am with Dr Berline Lopes. Patient given the opinion to see Dr Denman George on 2/22, but the patient requested 3/4. Patient given the address and phone number for the clinic. Patient also given the policy for mask and visitors

## 2020-10-22 LAB — AEROBIC/ANAEROBIC CULTURE W GRAM STAIN (SURGICAL/DEEP WOUND): Culture: NO GROWTH

## 2020-10-23 ENCOUNTER — Telehealth: Payer: Self-pay | Admitting: *Deleted

## 2020-10-23 NOTE — Telephone Encounter (Signed)
Pt requesting a return to work note as soon as possible. Would be ok with seeing another Provider for her post op. If she can get her FMLA forms filled out sooner

## 2020-10-23 NOTE — Telephone Encounter (Signed)
Called patient Friday afternoon and confirmed all appointments. Patient is agreeable as scheduled.    Routing to provider for notification. Encounter closed.

## 2020-10-23 NOTE — Telephone Encounter (Signed)
Spoke with patient regarding returning to work post-op. Patient stated that she was not having any post-op issues and would like to return to work on 10/25/2020, after her appointment at [facility]. Reviewed with Dr. Talbert Nan to confirm OK for patient to return to work. Will wait to receive new FMLA paperwork and fax to employer.   Encounter closed.

## 2020-10-24 ENCOUNTER — Ambulatory Visit: Payer: 59 | Admitting: Obstetrics and Gynecology

## 2020-10-24 ENCOUNTER — Other Ambulatory Visit: Payer: Self-pay | Admitting: Obstetrics and Gynecology

## 2020-10-25 NOTE — Telephone Encounter (Signed)
Patient scheduled with Dr.Tucker on 11/03/20

## 2020-10-31 ENCOUNTER — Ambulatory Visit: Payer: Self-pay | Admitting: Obstetrics and Gynecology

## 2020-10-31 NOTE — Patient Instructions (Addendum)
Preparing for your Surgery  Plan for surgery on November 09, 2020 with Dr. Jeral Pinch and Dr. Milford Cage at [place] will be scheduled for a possible dilation and curettage of the uterus, robotic assisted total laparoscopic hysterectomy (removal of the uterus and cervix), bilateral salpingo-oophorectomy (removal of both ovaries and fallopian tubes), sentinel lymph node biopsy, possible lymph node dissection, possible laparotomy (larger incision on your abdomen if needed), procedure with Dr. Milford Cage.   Pre-operative Testing -You will receive a phone call from presurgical testing at [facility] to arrange for a pre-operative appointment, labs, and COVID test. The COVID test normally happens 3 days prior to the surgery and they ask that you self quarantine after the test up until surgery to decrease chance of exposure.  -Bring your insurance card, copy of an advanced directive if applicable, medication list  -At that visit, you will be asked to sign a consent for a possible blood transfusion in case a transfusion becomes necessary during surgery.  The need for a blood transfusion is rare but having consent is a necessary part of your care.     -You should not be taking blood thinners or aspirin at least ten days prior to surgery unless instructed by your surgeon.  -Do not take supplements such as fish oil (omega 3), red yeast rice, turmeric before your surgery. You want to avoid medications with aspirin in them including headache powders such as BC or Goody's), Excedrin migraine.  Day Before Surgery at [place] will be asked to take in a light diet the day before surgery. You will be advised you can have clear liquids up until 3 hours before your surgery.    Eat a light diet the day before surgery.  Examples including soups, broths, toast, yogurt, mashed potatoes.  AVOID GAS PRODUCING FOODS. Things to avoid include carbonated beverages (fizzy beverages, sodas), raw fruits  and raw vegetables (uncooked), or beans.   If your bowels are filled with gas, your surgeon will have difficulty visualizing your pelvic organs which increases your surgical risks.  Your role in recovery Your role is to become active as soon as directed by your doctor, while still giving yourself time to heal.  Rest when you feel tired. You will be asked to do the following in order to speed your recovery:  - Cough and breathe deeply. This helps to clear and expand your lungs and can prevent pneumonia after surgery.  - [place]. Do mild physical activity. Walking or moving your legs help your circulation and body functions return to normal. Do not try to get up or walk alone the first time after surgery.   -If you develop swelling on one leg or the other, pain in the back of your leg, redness/warmth in one of your legs, please call the office or go to the Emergency Room to have a doppler to rule out a blood clot. For shortness of breath, chest pain-seek care in the Emergency Room as soon as possible. - Actively manage your pain. Managing your pain lets you move in comfort. We will ask you to rate your pain on a scale of zero to 10. It is your responsibility to tell your doctor or nurse where and how much you hurt so your pain can be treated.  Special Considerations -If you are diabetic, you may be placed on insulin after surgery to have closer control over your blood sugars to promote healing and recovery.  This  does not mean that you will be discharged on insulin.  If applicable, your oral antidiabetics will be resumed when you are tolerating a solid diet.  -Your final pathology results from surgery should be available around one week after surgery and the results will be relayed to you when available.  -FMLA forms can be faxed to [phone] and please allow 5-7 business days for completion.  Pain Management After Surgery -You have been prescribed additional ibuprofen and  bowel regimen medications before surgery so that you can have these available when you are discharged from the hospital.   -Make sure that you have Tylenol at home to use on a regular basis after surgery for pain control. We recommend alternating this medication with ibuprofen every hour to six hours since they work differently and are processed in the body differently for pain relief.  Bowel Regimen -You have been prescribed Sennakot-S to take nightly to prevent constipation especially if you are taking the narcotic pain medication intermittently.  It is important to prevent constipation and drink adequate amounts of liquids. You can stop taking this medication when you are not taking pain medication and you are back on your normal bowel routine.  Risks of Surgery Risks of surgery are low but include bleeding, infection, damage to surrounding structures, re-operation, blood clots, and very rarely death.   Blood Transfusion Information (For the consent to be signed before surgery)  We will be checking your blood type before surgery so in case of emergencies, we will know what type of blood you would need.                                            WHAT IS A BLOOD TRANSFUSION?  A transfusion is the replacement of blood or some of its parts. Blood is made up of multiple cells which provide different functions.  Red blood cells carry oxygen and are used for blood loss replacement.  White blood cells fight against infection.  Platelets control bleeding.  Plasma helps clot blood.  Other blood products are available for specialized needs, such as hemophilia or other clotting disorders. BEFORE THE TRANSFUSION  Who gives blood for transfusions?   You may be able to donate blood to be used at a later date on yourself (autologous donation).  Relatives can be asked to donate blood. This is generally not any safer than if you have received blood from a stranger. The same precautions are taken to  ensure safety when a relative's blood is donated.  Healthy volunteers who are fully evaluated to make sure their blood is safe. This is blood bank blood. Transfusion therapy is the safest it has ever been in the practice of medicine. Before blood is taken from a donor, a complete history is taken to make sure that person has no history of diseases nor engages in risky social behavior (examples are intravenous drug use or sexual activity with multiple partners). The donor's travel history is screened to minimize risk of transmitting infections, such as malaria. The donated blood is tested for signs of infectious diseases, such as HIV and hepatitis. The blood is then tested to be sure it is compatible with you in order to minimize the chance of a transfusion reaction. If you or a relative donates blood, this is often done in anticipation of surgery and is not appropriate for emergency situations. It takes  many days to process the donated blood. RISKS AND COMPLICATIONS Although transfusion therapy is very safe and saves many lives, the main dangers of transfusion include:   Getting an infectious disease.  Developing a transfusion reaction. This is an allergic reaction to something in the blood you were given. Every precaution is taken to prevent this. The decision to have a blood transfusion has been considered carefully by your caregiver before blood is given. Blood is not given unless the benefits outweigh the risks.  AFTER SURGERY INSTRUCTIONS  Return to work: 4 weeks if applicable  Activity: 1. Be up and out of the bed during the day.  Take a nap if needed.  You may walk up steps but be careful and use the hand rail.  Stair climbing will tire you more than you think, you may need to stop part way and rest.   2. No lifting or straining for 6 weeks over 10 pounds. No pushing, pulling, straining for 6 weeks.  3. No driving for around 1 week(s).  Do not drive if you are taking narcotic pain  medicine and make sure that your reaction time has returned.   4. You can shower as soon as the next day after surgery. Shower daily.  Use your regular soap and water (not directly on the incision) and pat your incision(s) dry afterwards; don't rub.  No tub baths or submerging your body in water until cleared by your surgeon. If you have the soap that was given to you by pre-surgical testing that was used before surgery, you do not need to use it afterwards because this can irritate your incisions.   5. No sexual activity and nothing in the vagina for 8 weeks.  6. You may experience a small amount of clear drainage from your incisions, which is normal.  If the drainage persists, increases, or changes color please call the office.  7. Do not use creams, lotions, or ointments such as neosporin on your incisions after surgery until advised by your surgeon because they can cause removal of the dermabond glue on your incisions.    8. You may experience vaginal spotting after surgery or around the 6-8 week mark from surgery when the stitches at the top of the vagina begin to dissolve.  The spotting is normal but if you experience heavy bleeding, call our office.  9. Take Tylenol or ibuprofen first for pain and only use narcotic pain medication for severe pain not relieved by the Tylenol or Ibuprofen.  Monitor your Tylenol intake to a max of 4,000 mg in a 24 hour period. You can alternate these medications after surgery.  Diet: 1. Low sodium Heart Healthy Diet is recommended but you are cleared to resume your normal (before surgery) diet after your procedure.  2. It is safe to use a laxative, such as Miralax or Colace, if you have difficulty moving your bowels. You have been prescribed Sennakot at bedtime every evening to keep bowel movements regular and to prevent constipation.    Wound Care: 1. Keep clean and dry.  Shower daily.  Reasons to call the Doctor:  Fever - Oral temperature greater than  100.4 degrees Fahrenheit  Foul-smelling vaginal discharge  Difficulty urinating  Nausea and vomiting  Increased pain at the site of the incision that is unrelieved with pain medicine.  Difficulty breathing with or without chest pain  New calf pain especially if only on one side  Sudden, continuing increased vaginal bleeding with or without clots.  Contacts: For questions or concerns you should contact:  Dr. Jeral Pinch at [phone]  Joylene John, NP at [phone]  After Hours: call [phone] and have the GYN Oncologist paged/contacted (after 5 pm or on the weekends).  Messages sent via mychart are for non-urgent matters and are not responded to after hours so for urgent needs, please call the after hours number.

## 2020-11-02 ENCOUNTER — Encounter: Payer: Self-pay | Admitting: Gynecologic Oncology

## 2020-11-02 ENCOUNTER — Ambulatory Visit: Payer: 59 | Admitting: Obstetrics and Gynecology

## 2020-11-03 ENCOUNTER — Encounter: Payer: Self-pay | Admitting: Gynecologic Oncology

## 2020-11-03 ENCOUNTER — Inpatient Hospital Stay: Payer: 59 | Attending: Gynecologic Oncology | Admitting: Gynecologic Oncology

## 2020-11-03 ENCOUNTER — Other Ambulatory Visit: Payer: Self-pay | Admitting: Gynecologic Oncology

## 2020-11-03 ENCOUNTER — Other Ambulatory Visit: Payer: Self-pay

## 2020-11-03 VITALS — BP 150/93 | HR 74 | Temp 96.6°F | Resp 18 | Ht 67.0 in | Wt 184.0 lb

## 2020-11-03 DIAGNOSIS — K219 Gastro-esophageal reflux disease without esophagitis: Secondary | ICD-10-CM | POA: Insufficient documentation

## 2020-11-03 DIAGNOSIS — Z780 Asymptomatic menopausal state: Secondary | ICD-10-CM | POA: Diagnosis not present

## 2020-11-03 DIAGNOSIS — Z90722 Acquired absence of ovaries, bilateral: Secondary | ICD-10-CM | POA: Diagnosis not present

## 2020-11-03 DIAGNOSIS — Z79899 Other long term (current) drug therapy: Secondary | ICD-10-CM | POA: Insufficient documentation

## 2020-11-03 DIAGNOSIS — N3289 Other specified disorders of bladder: Secondary | ICD-10-CM | POA: Diagnosis not present

## 2020-11-03 DIAGNOSIS — C541 Malignant neoplasm of endometrium: Secondary | ICD-10-CM | POA: Insufficient documentation

## 2020-11-03 DIAGNOSIS — R8290 Unspecified abnormal findings in urine: Secondary | ICD-10-CM | POA: Diagnosis not present

## 2020-11-03 DIAGNOSIS — I2510 Atherosclerotic heart disease of native coronary artery without angina pectoris: Secondary | ICD-10-CM | POA: Diagnosis not present

## 2020-11-03 DIAGNOSIS — D751 Secondary polycythemia: Secondary | ICD-10-CM | POA: Insufficient documentation

## 2020-11-03 DIAGNOSIS — Z90710 Acquired absence of both cervix and uterus: Secondary | ICD-10-CM | POA: Insufficient documentation

## 2020-11-03 DIAGNOSIS — M1990 Unspecified osteoarthritis, unspecified site: Secondary | ICD-10-CM | POA: Insufficient documentation

## 2020-11-03 DIAGNOSIS — F329 Major depressive disorder, single episode, unspecified: Secondary | ICD-10-CM | POA: Insufficient documentation

## 2020-11-03 DIAGNOSIS — R1013 Epigastric pain: Secondary | ICD-10-CM | POA: Diagnosis not present

## 2020-11-03 DIAGNOSIS — M810 Age-related osteoporosis without current pathological fracture: Secondary | ICD-10-CM | POA: Insufficient documentation

## 2020-11-03 DIAGNOSIS — I10 Essential (primary) hypertension: Secondary | ICD-10-CM | POA: Insufficient documentation

## 2020-11-03 MED ORDER — IBUPROFEN 800 MG PO TABS: 800.0000 mg | ORAL_TABLET | Freq: Three times a day (TID) | ORAL | 0 refills | Status: DC | PRN

## 2020-11-03 MED ORDER — SENNOSIDES-DOCUSATE SODIUM 8.6-50 MG PO TABS: 2.0000 | ORAL_TABLET | Freq: Every day | ORAL | 0 refills | Status: DC

## 2020-11-03 NOTE — Progress Notes (Signed)
GYNECOLOGIC ONCOLOGY NEW PATIENT CONSULTATION   Patient Name: Danielle Knapp  Patient Age: 65 y.o. Date of Service: 11/03/2020 Referring Provider: Josefa Half MD  Primary Care Provider: Harlan Stains, MD Consulting Provider: Jeral Pinch, MD   Assessment/Plan:  Postmenopausal patient with clinical stage I uterine cancer.  We reviewed the nature of endometrial cancer and its recommended surgical staging, including total hysterectomy, bilateral salpingo-oophorectomy, and lymph node assessment. The patient is a suitable candidate for staging via a minimally invasive approach to surgery.  We reviewed that robotic assistance would be used to complete the surgery.   We discussed that most endometrial cancer is detected early and that decisions regarding adjuvant therapy will be made based on her final pathology.  In her work-up, the patient has had both a CT of the abdomen and pelvis as well as MRI.  No evidence of metastatic disease on imaging.  We reviewed the sentinel lymph node technique. Risks and benefits of sentinel lymph node biopsy was reviewed. We reviewed the technique and ICG dye. The patient DOES NOT have an iodine allergy or known liver dysfunction. We reviewed the false negative rate (0.4%), and that 3% of patients with metastatic disease will not have it detected by SLN biopsy in endometrial cancer. A low risk of allergic reaction to the dye, <0.2% for ICG, has been reported. We also discussed that in the case of failed mapping, which occurs 40% of the time, a bilateral or unilateral lymphadenectomy will be performed at the surgeons discretion.   Potential benefits of sentinel nodes including a higher detection rate for metastasis due to ultrastaging and potential reduction in operative morbidity. However, there remains uncertainty as to the role for treatment of micrometastatic disease. Further, the benefit of operative morbidity associated with the SLN technique in endometrial  cancer is not yet completely known. In other patient populations (e.g. the cervical cancer population) there has been observed reductions in morbidity with SLN biopsy compared to pelvic lymphadenectomy. Lymphedema, nerve dysfunction and lymphocysts are all potential risks with the SLN technique as with complete lymphadenectomy. Additional risks to the patient include the risk of damage to an internal organ while operating in an altered view (e.g. the black and white image of the robotic fluorescence imaging mode).   We discussed the plan for a dilation and curettage for uterine decompression, robotic assisted hysterectomy, bilateral salpingo-oophorectomy, sentinel lymph node evaluation, possible lymph node dissection, possible laparotomy. The risks of surgery were discussed in detail and she understands these to include infection; wound separation; hernia; vaginal cuff separation, injury to adjacent organs such as bowel, bladder, blood vessels, ureters and nerves; bleeding which may require blood transfusion; anesthesia risk; thromboembolic events; possible death; unforeseen complications; possible need for re-exploration; medical complications such as heart attack, stroke, pleural effusion and pneumonia; and, if full lymphadenectomy is performed the risk of lymphedema and lymphocyst. The patient will receive DVT and antibiotic prophylaxis as indicated. She voiced a clear understanding. She had the opportunity to ask questions. Perioperative instructions were reviewed with her. Prescriptions for post-op medications were sent to her pharmacy of choice.  Given findings of bladder lesions at the time of her hysteroscopy and D&C, we'll plan for joint procedure with cystoscopy and biopsy with Dr. Milford Cage.   A copy of this note was sent to the patient's referring provider.   70 minutes of total time was spent for this patient encounter, including preparation, face-to-face counseling with the patient and  coordination of care, and documentation of the encounter.  Jeral Pinch, MD  Division of Gynecologic Oncology  Department of Obstetrics and Gynecology  [facility]  ___________________________________________  Chief Complaint: Chief Complaint  Patient presents with   Endometrial cancer [facility])    History of Present Illness:  Danielle Knapp is a 65 y.o. y.o. female who is seen in consultation at the request of Dr. Quincy Simmonds for an evaluation of newly diagnosed uterine cancer.  Patient reports menopause some years ago.  She denies any vaginal bleeding.  She was seen in mid November for her annual exam, at that visit a vaginal sidewall polyp was noted and a visit was scheduled for her to come back in for excision.  Pathology from this showed no dysplasia or cancer.  In mid December, the patient endorses starting to feel bloated, abdominal fullness, and significant pelvic pain.  She thought that this was related to diverticulitis, which she has had 2 previous bouts of.  She tried a brat diet which did not help.  Given persistent symptoms in January, she reached out to her primary care provider and ultimately underwent CT abdomen and pelvis in late January.  CT scan was notable for endocervical and endometrial mass/fluid.  She then underwent an MRI, confirming these results.  More recently, she had hysteroscopy with dilation and curettage as well as cystoscopy on 2/15.  Findings at this time of surgery were a distended lower uterine segment.  The uterus was filled with dark blood and mucus.  Approximately 250 cc of fluid was drained.  On hysteroscopy, the patient was noted to have a 1.5 cm polypoid vascular mass emanating from the right cornua.  On cystoscopy, the patient was found to have a polypoid tumor near the left ureteral orifice as well as the right ureteral orifice, both which were sub-1 cm in size.  Since her procedure, patient noted some light bleeding, typically  when she voids.  She denies any further pelvic or abdominal pain since surgery.  She reports having a good appetite without nausea or emesis.  The bloating that she was feeling in December and January improved completely after surgery.  She notes regular bowel function.  She has had some increased urinary frequency as well as urgency the last 3 or 4 months.  Her medical history is notable for hypertension.  She had a cardiac cath in the past and was diagnosed with mild coronary artery disease.  She denies any chest pain or shortness of breath with ambulation.  Her surgical history is notable for diagnostic laparoscopy for infertility about 30 years ago.  She was told at that time that findings were consistent with mild endometriosis.  Her maternal grandfather had prostate cancer.  Her sister reports a history of ovarian cancer although the patient notes that this history is somewhat unreliable.  Debbie lives in [place] with her husband.  He recently finished treatment for prostate cancer.  She works at a [employer].  PAST MEDICAL HISTORY:  Past Medical History:  Diagnosis Date   Allergic rhinitis    Borderline glaucoma, left    no eye drops   BPPV (benign paroxysmal positional vertigo)    Chondromalacia of patella    left   Coronary artery disease    cardiac cath 11-03-2017 done for positive ETT,  showed very miminal nonobstrutive cad, ef 55-65%, non caridac chest pain   Diverticulosis of colon    Frequency of urination    GERD (gastroesophageal reflux disease)    History of chest pain 10/14/2017  ED visit w/ admission,  hypertensive urgency;  follow up with dr Doylene Canard--- had outpatient ETT 10-21-2017 positive ischemia, echo 10-16-2017 G1DD, 60-65%;    s/p cardiac cath 11-03-2017 w/ very miminal nonobstructive cad , chest pain non cardiac   History of diverticulitis    History of kidney stones    HSV-1 infection    fever blisters   Hx of adenomatous colonic polyps 02/08/2018    Hypertension    followed by pcp   MDD (major depressive disorder)    OA (osteoarthritis)    left knee, shoulder   Osteoporosis    Polycythemia    long hx followed by pcp and  sees hematology/ oncology--- dr Jerilynn Mages. Julien Nordmann on as needed basis, chronic and told to donate blood regularly   Stenosis of cervix    Uterine mass    Wears glasses      PAST SURGICAL HISTORY:  Past Surgical History:  Procedure Laterality Date   CATARACT EXTRACTION W/ INTRAOCULAR LENS  IMPLANT, BILATERAL  2013   COLONOSCOPY  02-02-2018 dr Carlean Purl   CYSTOSCOPY  10/17/2020   Procedure: CYSTOSCOPY;  Surgeon: Nunzio Cobbs, MD;  Location: [place];  Service: Gynecology;;   DIAGNOSTIC LAPAROSCOPY  [place] N/A 10/17/2020   Procedure: DILATATION & CURETTAGE/HYSTEROSCOPY WITH  MYOSURE RESECTION OF ENDOMETRIAL MASS,  OPENING OF CERVICAL STENOSIS, DRAINAGE OF UTERINE FLUID;  Surgeon: Nunzio Cobbs, MD;  Location: [place];  Service: Gynecology;  Laterality: N/A;   HEEL SPUR SURGERY Right 2015   Dr. Durward Fortes   HEMORRHOID SURGERY  2004   LEFT HEART CATH AND CORONARY ANGIOGRAPHY N/A 11/03/2017   Procedure: LEFT HEART CATH AND CORONARY ANGIOGRAPHY;  Surgeon: Jettie Booze, MD;  Location: [place] [facility];  Service: Cardiovascular;  Laterality: N/A;   OPERATIVE ULTRASOUND N/A 10/17/2020   Procedure: OPERATIVE ULTRASOUND;  Surgeon: Nunzio Cobbs, MD;  Location[facility];  Service: Gynecology;  Laterality: N/A;   SEPTOPLASTY  x2 62's   TONSILLECTOMY  age 38    OB/GYN HISTORY:  OB History  Gravida Para Term Preterm AB Living  0 0 0 0 0 0  SAB IAB Ectopic Multiple Live Births  0 0 0 0      Patient's last menstrual period was 09/03/2011.  Age at menarche: 80 Age at menopause: Does not remember Hx of HRT: Denies Hx of STDs: Denies Last pap: 09/2020,  negative History of abnormal pap smears: Denies  SCREENING STUDIES:  Last mammogram: 2021  Last colonoscopy: 2019  MEDICATIONS: Outpatient Encounter Medications as of 11/03/2020  Medication Sig   amLODipine (NORVASC) 2.5 MG tablet Take 2.5 mg by mouth daily.   buPROPion (WELLBUTRIN XL) 300 MG 24 hr tablet Take 300 mg by mouth daily.   Calcium Carb-Cholecalciferol 600-200 MG-UNIT TABS Take 1 tablet by mouth at bedtime.   Chlorphen-Pseudoephed-APAP (CORICIDIN D PO) Take 1 tablet by mouth daily.   fluticasone (FLONASE) 50 MCG/ACT nasal spray Place 2 sprays into both nostrils daily.   losartan (COZAAR) 100 MG tablet Take 100 mg by mouth daily.   Multiple Vitamin (MULTIVITAMIN) tablet Take 1 tablet by mouth at bedtime.   omeprazole (PRILOSEC) 40 MG capsule Take 40 mg by mouth daily.   senna-docusate (SENOKOT-S) 8.6-50 MG tablet Take 2 tablets by mouth at bedtime. For AFTER surgery, do not take if having diarrhea   sertraline (ZOLOFT) 100 MG tablet Take 150 mg  by mouth daily.    valACYclovir (VALTREX) 1000 MG tablet Take 2,000 mg by mouth 2 (two) times daily as needed (for fever blisters).    zolpidem (AMBIEN) 10 MG tablet Take 10 mg by mouth at bedtime as needed.   ibuprofen (ADVIL) 800 MG tablet Take 1 tablet (800 mg total) by mouth every 8 (eight) hours as needed for moderate pain. For AFTER surgery   oxyCODONE-acetaminophen (PERCOCET) 5-325 MG tablet Take 1 tablet by mouth every 4 (four) hours as needed for severe pain. (Patient not taking: Reported on 11/02/2020)   [DISCONTINUED] ibuprofen (ADVIL) 800 MG tablet Take 1 tablet (800 mg total) by mouth every 8 (eight) hours as needed. (Patient not taking: Reported on 11/02/2020)   No facility-administered encounter medications on file as of 11/03/2020.    ALLERGIES:  Allergies  Allergen Reactions   Ciprofloxacin Hcl Anaphylaxis and Rash   Erythromycin Diarrhea   Flagyl [Metronidazole] Anaphylaxis and Rash   Lisinopril Cough      FAMILY HISTORY:  Family History  Problem Relation Age of Onset   Osteoporosis Mother    Heart attack Mother    Alzheimer's disease Mother    Diabetes Father    Hypertension Father    Heart failure Father    Ovarian cancer Sister    Stroke Maternal Grandmother    Prostate cancer Maternal Grandfather    Colon cancer Neg Hx    Colon polyps Neg Hx    Esophageal cancer Neg Hx    Rectal cancer Neg Hx    Stomach cancer Neg Hx    Breast cancer Neg Hx    Endometrial cancer Neg Hx    Pancreatic cancer Neg Hx      SOCIAL HISTORY:  Social Connections: Not on file    REVIEW OF SYSTEMS:  + Anxiety and depression Denies appetite changes, fevers, chills, fatigue, unexplained weight changes. Denies hearing loss, neck lumps or masses, mouth sores, ringing in ears or voice changes. Denies cough or wheezing.  Denies shortness of breath. Denies chest pain or palpitations. Denies leg swelling. Denies abdominal distention, pain, blood in stools, constipation, diarrhea, nausea, vomiting, or early satiety. Denies pain with intercourse, dysuria, frequency, hematuria or incontinence. Denies hot flashes, pelvic pain, vaginal bleeding or vaginal discharge.   Denies joint pain, back pain or muscle pain/cramps. Denies itching, rash, or wounds. Denies dizziness, headaches, numbness or seizures. Denies swollen lymph nodes or glands, denies easy bruising or bleeding. Denies confusion, or decreased concentration.  Physical Exam:  Vital Signs for this encounter:  Blood pressure (!) 150/93, pulse 74, temperature (!) 96.6 F (35.9 C), temperature source Tympanic, resp. rate 18, height 5\' 7"  (1.702 m), weight 184 lb (83.5 kg), last menstrual period 09/03/2011, SpO2 100 %. Body mass index is 28.82 kg/m. General: Alert, oriented, no acute distress.  HEENT: Normocephalic, atraumatic. Sclera anicteric.  Chest: Clear to auscultation bilaterally. No wheezes, rhonchi, or  rales. Cardiovascular: Regular rate and rhythm, no murmurs, rubs, or gallops.  Abdomen: Normoactive bowel sounds. Soft, nondistended, nontender to palpation. No masses or hepatosplenomegaly appreciated. No palpable fluid wave.  Extremities: Grossly normal range of motion. Warm, well perfused. No edema bilaterally.  Skin: No rashes or lesions.  Lymphatics: No cervical, supraclavicular, or inguinal adenopathy.  GU:  Normal external female genitalia. No lesions. No discharge or bleeding.             Bladder/urethra:  No lesions or masses, well supported bladder  Vagina: Somewhat narrowed vaginal introitus.  Vaginal mucosa mildly atrophic, no lesions noted.             Cervix: Small, flush with the vaginal apex, ill-defined.  Healing tissue centrally from recent procedure.             Uterus: Approximately 8-10 cm, mobile.  Upper cervix and lower uterine segment are somewhat bulbous although not extending to the sidewall.  No parametrial involvement or nodularity appreciated.               Adnexa: No masses appreciated.  LABORATORY AND RADIOLOGIC DATA:  Outside medical records were reviewed to synthesize the above history, along with the history and physical obtained during the visit.   Lab Results  Component Value Date   WBC 8.3 10/13/2020   HGB 17.0 (H) 10/13/2020   HCT 46.5 (H) 10/13/2020   PLT 303 10/13/2020   GLUCOSE 90 10/13/2020   ALT 25 10/25/2019   AST 18 10/25/2019   NA 141 10/13/2020   K 4.6 10/13/2020   CL 104 10/13/2020   CREATININE 0.69 10/13/2020   BUN 14 10/13/2020   CO2 28 10/13/2020   INR 1.0 10/31/2017   EMB on 2/15: A. ENDOMETRIUM MASS, RESECTION:  - Endometrioid adenocarcinoma.  - See comment.   B. ENDOMETRIUM, CURETTAGE:  - Blood and scanty benign endocervical mucosa.  - No endometrial tissue identified.   COMMENT:   A. The findings are consistent with FIGO grade 1/2.   MRI pelvis 10/09/20: Distended endocervical canal, as described above,  favoring a lower cervical stenosis/stricture.  Mild associated polypoid soft tissue/debris in the endometrial cavity and along the lower cervix, poorly evaluated on unenhanced MR. While the endometrial soft tissue favors debris, consider hysteroscopy to exclude a cervical mass resulting in Stenosis/stricture.  CT A/P on 1/25: 1. Large hypoenhancing mass in the region of the lower uterus/upper cervix. This appears to expand the endometrial canal the lower uterine segment although the endometrial canal in the uterine fundus is nondilated. As such, endometrial canal/cervical stricture is not considered likely. Rather, imaging features are highly concerning for neoplasm, likely of endometrial origin. 2. No findings of metastatic disease. Specifically, no evidence for lymphadenopathy in the abdomen/pelvis. No peritoneal or omental nodularity. No ascites. 3. Left colonic diverticulosis without diverticulitis

## 2020-11-03 NOTE — H&P (View-Only) (Signed)
GYNECOLOGIC ONCOLOGY NEW PATIENT CONSULTATION   Patient Name: Danielle Knapp  Patient Age: 65 y.o. Date of Service: 11/03/2020 Referring Provider: Josefa Half MD  Primary Care Provider: Harlan Stains, MD Consulting Provider: Jeral Pinch, MD   Assessment/Plan:  Postmenopausal patient with clinical stage I uterine cancer.  We reviewed the nature of endometrial cancer and its recommended surgical staging, including total hysterectomy, bilateral salpingo-oophorectomy, and lymph node assessment. The patient is a suitable candidate for staging via a minimally invasive approach to surgery.  We reviewed that robotic assistance would be used to complete the surgery.   We discussed that most endometrial cancer is detected early and that decisions regarding adjuvant therapy will be made based on her final pathology.  In her work-up, the patient has had both a CT of the abdomen and pelvis as well as MRI.  No evidence of metastatic disease on imaging.  We reviewed the sentinel lymph node technique. Risks and benefits of sentinel lymph node biopsy was reviewed. We reviewed the technique and ICG dye. The patient DOES NOT have an iodine allergy or known liver dysfunction. We reviewed the false negative rate (0.4%), and that 3% of patients with metastatic disease will not have it detected by SLN biopsy in endometrial cancer. A low risk of allergic reaction to the dye, <0.2% for ICG, has been reported. We also discussed that in the case of failed mapping, which occurs 40% of the time, a bilateral or unilateral lymphadenectomy will be performed at the surgeon's discretion.   Potential benefits of sentinel nodes including a higher detection rate for metastasis due to ultrastaging and potential reduction in operative morbidity. However, there remains uncertainty as to the role for treatment of micrometastatic disease. Further, the benefit of operative morbidity associated with the SLN technique in endometrial  cancer is not yet completely known. In other patient populations (e.g. the cervical cancer population) there has been observed reductions in morbidity with SLN biopsy compared to pelvic lymphadenectomy. Lymphedema, nerve dysfunction and lymphocysts are all potential risks with the SLN technique as with complete lymphadenectomy. Additional risks to the patient include the risk of damage to an internal organ while operating in an altered view (e.g. the black and white image of the robotic fluorescence imaging mode).   We discussed the plan for a dilation and curettage for uterine decompression, robotic assisted hysterectomy, bilateral salpingo-oophorectomy, sentinel lymph node evaluation, possible lymph node dissection, possible laparotomy. The risks of surgery were discussed in detail and she understands these to include infection; wound separation; hernia; vaginal cuff separation, injury to adjacent organs such as bowel, bladder, blood vessels, ureters and nerves; bleeding which may require blood transfusion; anesthesia risk; thromboembolic events; possible death; unforeseen complications; possible need for re-exploration; medical complications such as heart attack, stroke, pleural effusion and pneumonia; and, if full lymphadenectomy is performed the risk of lymphedema and lymphocyst. The patient will receive DVT and antibiotic prophylaxis as indicated. She voiced a clear understanding. She had the opportunity to ask questions. Perioperative instructions were reviewed with her. Prescriptions for post-op medications were sent to her pharmacy of choice.  Given findings of bladder lesions at the time of her hysteroscopy and D&C, we'll plan for joint procedure with cystoscopy and biopsy with Dr. Milford Cage.   A copy of this note was sent to the patient's referring provider.   70 minutes of total time was spent for this patient encounter, including preparation, face-to-face counseling with the patient and  coordination of care, and documentation of the encounter.  Jeral Pinch, MD  Division of Gynecologic Oncology  Department of Obstetrics and Gynecology  [facility]  ___________________________________________  Chief Complaint: Chief Complaint  Patient presents with  . Endometrial cancer ([place])    History of Present Illness:  Danielle Knapp is a 65 y.o. y.o. female who is seen in consultation at the request of Dr. Quincy Simmonds for an evaluation of newly diagnosed uterine cancer.  Patient reports menopause some years ago.  She denies any vaginal bleeding.  She was seen in mid November for her annual exam, at that visit a vaginal sidewall polyp was noted and a visit was scheduled for her to come back in for excision.  Pathology from this showed no dysplasia or cancer.  In mid December, the patient endorses starting to feel bloated, abdominal fullness, and significant pelvic pain.  She thought that this was related to diverticulitis, which she has had 2 previous bouts of.  She tried a brat diet which did not help.  Given persistent symptoms in January, she reached out to her primary care provider and ultimately underwent CT abdomen and pelvis in late January.  CT scan was notable for endocervical and endometrial mass/fluid.  She then underwent an MRI, confirming these results.  More recently, she had hysteroscopy with dilation and curettage as well as cystoscopy on 2/15.  Findings at this time of surgery were a distended lower uterine segment.  The uterus was filled with dark blood and mucus.  Approximately 250 cc of fluid was drained.  On hysteroscopy, the patient was noted to have a 1.5 cm polypoid vascular mass emanating from the right cornua.  On cystoscopy, the patient was found to have a polypoid tumor near the left ureteral orifice as well as the right ureteral orifice, both which were sub-1 cm in size.  Since her procedure, patient noted some light bleeding, typically  when she voids.  She denies any further pelvic or abdominal pain since surgery.  She reports having a good appetite without nausea or emesis.  The bloating that she was feeling in December and January improved completely after surgery.  She notes regular bowel function.  She has had some increased urinary frequency as well as urgency the last 3 or 4 months.  Her medical history is notable for hypertension.  She had a cardiac cath in the past and was diagnosed with mild coronary artery disease.  She denies any chest pain or shortness of breath with ambulation.  Her surgical history is notable for diagnostic laparoscopy for infertility about 30 years ago.  She was told at that time that findings were consistent with mild endometriosis.  Her maternal grandfather had prostate cancer.  Her sister reports a history of ovarian cancer although the patient notes that this history is somewhat unreliable.  Debbie lives in [place] with her husband.  He recently finished treatment for prostate cancer.  She works at a [employer].  PAST MEDICAL HISTORY:  Past Medical History:  Diagnosis Date  . Allergic rhinitis   . Borderline glaucoma, left    no eye drops  . BPPV (benign paroxysmal positional vertigo)   . Chondromalacia of patella    left  . Coronary artery disease    cardiac cath 11-03-2017 done for positive ETT,  showed very miminal nonobstrutive cad, ef 55-65%, non caridac chest pain  . Diverticulosis of colon   . Frequency of urination   . GERD (gastroesophageal reflux disease)   . History of chest pain 10/14/2017  ED visit w/ admission,  hypertensive urgency;  follow up with dr Doylene Canard--- had outpatient ETT 10-21-2017 positive ischemia, echo 10-16-2017 G1DD, 60-65%;    s/p cardiac cath 11-03-2017 w/ very miminal nonobstructive cad , chest pain non cardiac  . History of diverticulitis   . History of kidney stones   . HSV-1 infection    fever blisters  . Hx of adenomatous colonic polyps 02/08/2018   . Hypertension    followed by pcp  . MDD (major depressive disorder)   . OA (osteoarthritis)    left knee, shoulder  . Osteoporosis   . Polycythemia    long hx followed by pcp and  sees hematology/ oncology--- dr m. Julien Nordmann on as needed basis, chronic and told to donate blood regularly  . Stenosis of cervix   . Uterine mass   . Wears glasses      PAST SURGICAL HISTORY:  Past Surgical History:  Procedure Laterality Date  . CATARACT EXTRACTION W/ INTRAOCULAR LENS  IMPLANT, BILATERAL  2013  . COLONOSCOPY  02-02-2018 dr Carlean Purl  . CYSTOSCOPY  10/17/2020   Procedure: CYSTOSCOPY;  Surgeon: Nunzio Cobbs, MD;  Location: [facility];  Service: Gynecology;;  . [place]  . DILATATION & CURETTAGE/HYSTEROSCOPY WITH MYOSURE N/A 10/17/2020   Procedure: DILATATION & CURETTAGE/HYSTEROSCOPY WITH  MYOSURE RESECTION OF ENDOMETRIAL MASS,  OPENING OF CERVICAL STENOSIS, DRAINAGE OF UTERINE FLUID;  Surgeon: Nunzio Cobbs, MD;  Location: [facility];  Service: Gynecology;  Laterality: N/A;  . HEEL SPUR SURGERY Right 2015   Dr. Durward Fortes  . HEMORRHOID SURGERY  2004  . LEFT HEART CATH AND CORONARY ANGIOGRAPHY N/A 11/03/2017   Procedure: LEFT HEART CATH AND CORONARY ANGIOGRAPHY;  Surgeon: Jettie Booze, MD;  Location: [facility];  Service: Cardiovascular;  Laterality: N/A;  . OPERATIVE ULTRASOUND N/A 10/17/2020   Procedure: OPERATIVE ULTRASOUND;  Surgeon: Nunzio Cobbs, MD;  Location: [facility];  Service: Gynecology;  Laterality: N/A;  . SEPTOPLASTY  x2 1970's  . TONSILLECTOMY  age 37    OB/GYN HISTORY:  OB History  Gravida Para Term Preterm AB Living  0 0 0 0 0 0  SAB IAB Ectopic Multiple Live Births  0 0 0 0      Patient's last menstrual period was 09/03/2011.  Age at menarche: 12 Age at menopause: Does not remember Hx of HRT: Denies Hx of STDs: Denies Last pap: 09/2020,  negative History of abnormal pap smears: Denies  SCREENING STUDIES:  Last mammogram: 2021  Last colonoscopy: 2019  MEDICATIONS: Outpatient Encounter Medications as of 11/03/2020  Medication Sig  . amLODipine (NORVASC) 2.5 MG tablet Take 2.5 mg by mouth daily.  Marland Kitchen buPROPion (WELLBUTRIN XL) 300 MG 24 hr tablet Take 300 mg by mouth daily.  . Calcium Carb-Cholecalciferol 600-200 MG-UNIT TABS Take 1 tablet by mouth at bedtime.  . Chlorphen-Pseudoephed-APAP (CORICIDIN D PO) Take 1 tablet by mouth daily.  . fluticasone (FLONASE) 50 MCG/ACT nasal spray Place 2 sprays into both nostrils daily.  Marland Kitchen losartan (COZAAR) 100 MG tablet Take 100 mg by mouth daily.  . Multiple Vitamin (MULTIVITAMIN) tablet Take 1 tablet by mouth at bedtime.  Marland Kitchen omeprazole (PRILOSEC) 40 MG capsule Take 40 mg by mouth daily.  Marland Kitchen senna-docusate (SENOKOT-S) 8.6-50 MG tablet Take 2 tablets by mouth at bedtime. For AFTER surgery, do not take if having diarrhea  . sertraline (ZOLOFT) 100 MG tablet Take 150 mg  by mouth daily.   . valACYclovir (VALTREX) 1000 MG tablet Take 2,000 mg by mouth 2 (two) times daily as needed (for fever blisters).   . zolpidem (AMBIEN) 10 MG tablet Take 10 mg by mouth at bedtime as needed.  Marland Kitchen ibuprofen (ADVIL) 800 MG tablet Take 1 tablet (800 mg total) by mouth every 8 (eight) hours as needed for moderate pain. For AFTER surgery  . oxyCODONE-acetaminophen (PERCOCET) 5-325 MG tablet Take 1 tablet by mouth every 4 (four) hours as needed for severe pain. (Patient not taking: Reported on 11/02/2020)  . [DISCONTINUED] ibuprofen (ADVIL) 800 MG tablet Take 1 tablet (800 mg total) by mouth every 8 (eight) hours as needed. (Patient not taking: Reported on 11/02/2020)   No facility-administered encounter medications on file as of 11/03/2020.    ALLERGIES:  Allergies  Allergen Reactions  . Ciprofloxacin Hcl Anaphylaxis and Rash  . Erythromycin Diarrhea  . Flagyl [Metronidazole] Anaphylaxis and Rash  . Lisinopril Cough      FAMILY HISTORY:  Family History  Problem Relation Age of Onset  . Osteoporosis Mother   . Heart attack Mother   . Alzheimer's disease Mother   . Diabetes Father   . Hypertension Father   . Heart failure Father   . Ovarian cancer Sister   . Stroke Maternal Grandmother   . Prostate cancer Maternal Grandfather   . Colon cancer Neg Hx   . Colon polyps Neg Hx   . Esophageal cancer Neg Hx   . Rectal cancer Neg Hx   . Stomach cancer Neg Hx   . Breast cancer Neg Hx   . Endometrial cancer Neg Hx   . Pancreatic cancer Neg Hx      SOCIAL HISTORY:  Social Connections: Not on file    REVIEW OF SYSTEMS:  + Anxiety and depression Denies appetite changes, fevers, chills, fatigue, unexplained weight changes. Denies hearing loss, neck lumps or masses, mouth sores, ringing in ears or voice changes. Denies cough or wheezing.  Denies shortness of breath. Denies chest pain or palpitations. Denies leg swelling. Denies abdominal distention, pain, blood in stools, constipation, diarrhea, nausea, vomiting, or early satiety. Denies pain with intercourse, dysuria, frequency, hematuria or incontinence. Denies hot flashes, pelvic pain, vaginal bleeding or vaginal discharge.   Denies joint pain, back pain or muscle pain/cramps. Denies itching, rash, or wounds. Denies dizziness, headaches, numbness or seizures. Denies swollen lymph nodes or glands, denies easy bruising or bleeding. Denies confusion, or decreased concentration.  Physical Exam:  Vital Signs for this encounter:  Blood pressure (!) 150/93, pulse 74, temperature (!) 96.6 F (35.9 C), temperature source Tympanic, resp. rate 18, height 5\' 7"  (1.702 m), weight 184 lb (83.5 kg), last menstrual period 09/03/2011, SpO2 100 %. Body mass index is 28.82 kg/m. General: Alert, oriented, no acute distress.  HEENT: Normocephalic, atraumatic. Sclera anicteric.  Chest: Clear to auscultation bilaterally. No wheezes, rhonchi, or  rales. Cardiovascular: Regular rate and rhythm, no murmurs, rubs, or gallops.  Abdomen: Normoactive bowel sounds. Soft, nondistended, nontender to palpation. No masses or hepatosplenomegaly appreciated. No palpable fluid wave.  Extremities: Grossly normal range of motion. Warm, well perfused. No edema bilaterally.  Skin: No rashes or lesions.  Lymphatics: No cervical, supraclavicular, or inguinal adenopathy.  GU:  Normal external female genitalia. No lesions. No discharge or bleeding.             Bladder/urethra:  No lesions or masses, well supported bladder  Vagina: Somewhat narrowed vaginal introitus.  Vaginal mucosa mildly atrophic, no lesions noted.             Cervix: Small, flush with the vaginal apex, ill-defined.  Healing tissue centrally from recent procedure.             Uterus: Approximately 8-10 cm, mobile.  Upper cervix and lower uterine segment are somewhat bulbous although not extending to the sidewall.  No parametrial involvement or nodularity appreciated.               Adnexa: No masses appreciated.  LABORATORY AND RADIOLOGIC DATA:  Outside medical records were reviewed to synthesize the above history, along with the history and physical obtained during the visit.   Lab Results  Component Value Date   WBC 8.3 10/13/2020   HGB 17.0 (H) 10/13/2020   HCT 46.5 (H) 10/13/2020   PLT 303 10/13/2020   GLUCOSE 90 10/13/2020   ALT 25 10/25/2019   AST 18 10/25/2019   NA 141 10/13/2020   K 4.6 10/13/2020   CL 104 10/13/2020   CREATININE 0.69 10/13/2020   BUN 14 10/13/2020   CO2 28 10/13/2020   INR 1.0 10/31/2017   EMB on 2/15: A. ENDOMETRIUM MASS, RESECTION:  - Endometrioid adenocarcinoma.  - See comment.   B. ENDOMETRIUM, CURETTAGE:  - Blood and scanty benign endocervical mucosa.  - No endometrial tissue identified.   COMMENT:   A. The findings are consistent with FIGO grade 1/2.   MRI pelvis 10/09/20: Distended endocervical canal, as described above,  favoring a lower cervical stenosis/stricture.  Mild associated polypoid soft tissue/debris in the endometrial cavity and along the lower cervix, poorly evaluated on unenhanced MR. While the endometrial soft tissue favors debris, consider hysteroscopy to exclude a cervical mass resulting in Stenosis/stricture.  CT A/P on 1/25: 1. Large hypoenhancing mass in the region of the lower uterus/upper cervix. This appears to expand the endometrial canal the lower uterine segment although the endometrial canal in the uterine fundus is nondilated. As such, endometrial canal/cervical stricture is not considered likely. Rather, imaging features are highly concerning for neoplasm, likely of endometrial origin. 2. No findings of metastatic disease. Specifically, no evidence for lymphadenopathy in the abdomen/pelvis. No peritoneal or omental nodularity. No ascites. 3. Left colonic diverticulosis without diverticulitis

## 2020-11-06 ENCOUNTER — Other Ambulatory Visit: Payer: Self-pay | Admitting: Urology

## 2020-11-06 ENCOUNTER — Other Ambulatory Visit (HOSPITAL_COMMUNITY)
Admission: RE | Admit: 2020-11-06 | Discharge: 2020-11-06 | Disposition: A | Discharge status: Home / Self Care | Payer: 59 | Source: Ambulatory Visit | Attending: Gynecologic Oncology | Admitting: Gynecologic Oncology

## 2020-11-06 ENCOUNTER — Encounter: Payer: Self-pay | Admitting: Gynecologic Oncology

## 2020-11-06 ENCOUNTER — Other Ambulatory Visit: Payer: Self-pay

## 2020-11-06 ENCOUNTER — Encounter (HOSPITAL_COMMUNITY)
Admission: RE | Admit: 2020-11-06 | Discharge: 2020-11-06 | Disposition: A | Discharge status: Home / Self Care | Payer: 59 | Source: Ambulatory Visit | Attending: Gynecologic Oncology | Admitting: Gynecologic Oncology

## 2020-11-06 ENCOUNTER — Encounter (HOSPITAL_COMMUNITY): Payer: Self-pay | Admitting: Gynecologic Oncology

## 2020-11-06 DIAGNOSIS — Z01812 Encounter for preprocedural laboratory examination: Secondary | ICD-10-CM | POA: Insufficient documentation

## 2020-11-06 DIAGNOSIS — Z20822 Contact with and (suspected) exposure to covid-19: Secondary | ICD-10-CM | POA: Diagnosis not present

## 2020-11-06 LAB — SARS CORONAVIRUS 2 (TAT 6-24 HRS): SARS Coronavirus 2: NEGATIVE

## 2020-11-06 LAB — CBC
HCT: 45.6 % (ref 36.0–46.0)
Hemoglobin: 16.3 g/dL — ABNORMAL HIGH (ref 12.0–15.0)
MCH: 31.4 pg (ref 26.0–34.0)
MCHC: 35.7 g/dL (ref 30.0–36.0)
MCV: 87.9 fL (ref 80.0–100.0)
Platelets: 269 10*3/uL (ref 150–400)
RBC: 5.19 MIL/uL — ABNORMAL HIGH (ref 3.87–5.11)
RDW: 13.2 % (ref 11.5–15.5)
WBC: 8.4 10*3/uL (ref 4.0–10.5)
nRBC: 0 % (ref 0.0–0.2)

## 2020-11-06 LAB — COMPREHENSIVE METABOLIC PANEL
ALT: 29 U/L (ref 0–44)
AST: 24 U/L (ref 15–41)
Albumin: 4.2 g/dL (ref 3.5–5.0)
Alkaline Phosphatase: 66 U/L (ref 38–126)
Anion gap: 11 (ref 5–15)
BUN: 14 mg/dL (ref 8–23)
CO2: 25 mmol/L (ref 22–32)
Calcium: 9.1 mg/dL (ref 8.9–10.3)
Chloride: 105 mmol/L (ref 98–111)
Creatinine, Ser: 0.69 mg/dL (ref 0.44–1.00)
GFR, Estimated: >60 mL/min (ref >60)
Glucose, Bld: 79 mg/dL (ref 70–99)
Potassium: 3.9 mmol/L (ref 3.5–5.1)
Sodium: 141 mmol/L (ref 135–145)
Total Bilirubin: 1 mg/dL (ref 0.3–1.2)
Total Protein: 7 g/dL (ref 6.5–8.1)

## 2020-11-06 LAB — URINALYSIS, ROUTINE W REFLEX MICROSCOPIC
Bilirubin Urine: NEGATIVE
Glucose, UA: NEGATIVE mg/dL
Hgb urine dipstick: NEGATIVE
Ketones, ur: NEGATIVE mg/dL
Leukocytes,Ua: NEGATIVE
Nitrite: NEGATIVE
Protein, ur: NEGATIVE mg/dL
Specific Gravity, Urine: 1.005 (ref 1.005–1.030)
pH: 6 (ref 5.0–8.0)

## 2020-11-06 NOTE — Patient Instructions (Signed)
DUE TO COVID-19 ONLY ONE VISITOR IS ALLOWED TO COME WITH YOU AND STAY IN THE WAITING ROOM ONLY DURING PRE OP AND PROCEDURE.    COVID SWAB TESTING MUST BE COMPLETED ON:  Today at 2:35 PM   [address] [place], [place] [address]  (Must self quarantine after testing. Follow instructions on handout.)   Your procedure is scheduled on: Thursday, November 09, 2020   Report to [facility] Main  Entrance   Report to Short Stay at 5:30 AM   [facility])   Call this number if you have problems the morning of surgery [phone]   Do not eat food :After Midnight.   May have liquids until  4:30AM  day of surgery  CLEAR LIQUID DIET  Foods Allowed                                                                     Foods Excluded  Water, Black Coffee and tea, regular and decaf                             liquids that you cannot  Plain Jell-O in any flavor  (No red)                                           see through such as: Fruit ices (not with fruit pulp)                                     milk, soups, orange juice              Iced Popsicles (No red)                                    All solid food                                   Apple juices Sports drinks like Gatorade (No red) Lightly seasoned clear broth or consume(fat free) Sugar, honey syrup  Sample Menu Breakfast                                Lunch                                     Supper Cranberry juice                    Beef broth                            Chicken broth Jell-O  Grape juice                           Apple juice Coffee or tea                        Jell-O                                      Popsicle                                                Coffee or tea                        Coffee or tea       Oral Hygiene is also important to reduce your risk of infection.                                    Remember - BRUSH YOUR TEETH THE MORNING OF SURGERY WITH YOUR  REGULAR TOOTHPASTE   Do NOT smoke after Midnight   Take these medicines the morning of surgery with A SIP OF WATER: Amlodipine, Wellbutrin, Loratadine, Omeprazole, Sertraline   May use nasal spray the morning of procedure              You may not have any metal on your body including hair pins, jewelry, and body piercings             Do not wear make-up, lotions, powders, perfumes/cologne, or deodorant             Do not wear nail polish.  Do not shave  48 hours prior to surgery.               Do not bring valuables to the hospital. [place].   Contacts, dentures or bridgework may not be worn into surgery.    Patients discharged the day of surgery will not be allowed to drive home.   Special Instructions: Bring a copy of your healthcare power of attorney and living will documents         the day of surgery if you haven't scanned them in before.              Please read over the following fact sheets you were given: IF YOU HAVE QUESTIONS ABOUT YOUR PRE OP INSTRUCTIONS PLEASE CALL [phone]   [place] - Preparing for Surgery Before surgery, you can play an important role.  Because skin is not sterile, your skin needs to be as free of germs as possible.  You can reduce the number of germs on your skin by washing with CHG (chlorahexidine gluconate) soap before surgery.  CHG is an antiseptic cleaner which kills germs and bonds with the skin to continue killing germs even after washing. Please DO NOT use if you have an allergy to CHG or antibacterial soaps.  If your skin becomes reddened/irritated stop using the CHG and inform your nurse when you arrive at Short Stay. Do  not shave (including legs and underarms) for at least 48 hours prior to the first CHG shower.  You may shave your face/neck.  Please follow these instructions carefully:  1.  Shower with CHG Soap the night before surgery and the  morning of surgery.  2.  If you choose  to wash your hair, wash your hair first as usual with your normal  shampoo.  3.  After you shampoo, rinse your hair and body thoroughly to remove the shampoo.                             4.  Use CHG as you would any other liquid soap.  You can apply chg directly to the skin and wash.  Gently with a scrungie or clean washcloth.  5.  Apply the CHG Soap to your body ONLY FROM THE NECK DOWN.   Do   not use on face/ open                           Wound or open sores. Avoid contact with eyes, ears mouth and   genitals (private parts).                       Wash face,  Genitals (private parts) with your normal soap.             6.  Wash thoroughly, paying special attention to the area where your    surgery  will be performed.  7.  Thoroughly rinse your body with warm water from the neck down.  8.  DO NOT shower/wash with your normal soap after using and rinsing off the CHG Soap.                9.  Pat yourself dry with a clean towel.            10.  Wear clean pajamas.            11.  Place clean sheets on your bed the night of your first shower and do not  sleep with pets. Day of Surgery : Do not apply any lotions/deodorants the morning of surgery.  Please wear clean clothes to the hospital/surgery center.  FAILURE TO FOLLOW THESE INSTRUCTIONS MAY RESULT IN THE CANCELLATION OF YOUR SURGERY  PATIENT SIGNATURE_________________________________  NURSE SIGNATURE__________________________________  ________________________________________________________________________  WHAT IS A BLOOD TRANSFUSION? Blood Transfusion Information  A transfusion is the replacement of blood or some of its parts. Blood is made up of multiple cells which provide different functions.  Red blood cells carry oxygen and are used for blood loss replacement.  White blood cells fight against infection.  Platelets control bleeding.  Plasma helps clot blood.  Other blood products are available for specialized needs, such as  hemophilia or other clotting disorders. BEFORE THE TRANSFUSION  Who gives blood for transfusions?   Healthy volunteers who are fully evaluated to make sure their blood is safe. This is blood bank blood. Transfusion therapy is the safest it has ever been in the practice of medicine. Before blood is taken from a donor, a complete history is taken to make sure that person has no history of diseases nor engages in risky social behavior (examples are intravenous drug use or sexual activity with multiple partners). The donor's travel history is screened to minimize risk of transmitting infections, such as malaria. The donated blood  is tested for signs of infectious diseases, such as HIV and hepatitis. The blood is then tested to be sure it is compatible with you in order to minimize the chance of a transfusion reaction. If you or a relative donates blood, this is often done in anticipation of surgery and is not appropriate for emergency situations. It takes many days to process the donated blood. RISKS AND COMPLICATIONS Although transfusion therapy is very safe and saves many lives, the main dangers of transfusion include:   Getting an infectious disease.  Developing a transfusion reaction. This is an allergic reaction to something in the blood you were given. Every precaution is taken to prevent this. The decision to have a blood transfusion has been considered carefully by your caregiver before blood is given. Blood is not given unless the benefits outweigh the risks. AFTER THE TRANSFUSION  Right after receiving a blood transfusion, you will usually feel much better and more energetic. This is especially true if your red blood cells have gotten low (anemic). The transfusion raises the level of the red blood cells which carry oxygen, and this usually causes an energy increase.  The nurse administering the transfusion will monitor you carefully for complications. HOME CARE INSTRUCTIONS  No special  instructions are needed after a transfusion. You may find your energy is better. Speak with your caregiver about any limitations on activity for underlying diseases you may have. SEEK MEDICAL CARE IF:   Your condition is not improving after your transfusion.  You develop redness or irritation at the intravenous (IV) site. SEEK IMMEDIATE MEDICAL CARE IF:  Any of the following symptoms occur over the next 12 hours:  Shaking chills.  You have a temperature by mouth above 102 F (38.9 C), not controlled by medicine.  Chest, back, or muscle pain.  People around you feel you are not acting correctly or are confused.  Shortness of breath or difficulty breathing.  Dizziness and fainting.  You get a rash or develop hives.  You have a decrease in urine output.  Your urine turns a dark color or changes to pink, red, or brown. Any of the following symptoms occur over the next 10 days:  You have a temperature by mouth above 102 F (38.9 C), not controlled by medicine.  Shortness of breath.  Weakness after normal activity.  The white part of the eye turns yellow (jaundice).  You have a decrease in the amount of urine or are urinating less often.  Your urine turns a dark color or changes to pink, red, or brown. Document Released: 08/16/2000 Document Revised: 11/11/2011 Document Reviewed: 04/04/2008 [facility] Patient Information 2014 [place], [place].  _______________________________________________________________________

## 2020-11-06 NOTE — Progress Notes (Signed)
COVID Vaccine Completed: Yes Date COVID Vaccine completed: 12/01/19 Has received booster: Yes COVID vaccine manufacturer: Pfizer    Date of COVID positive in last 90 days: N/A  PCP - Harlan Stains, MD Cardiologist - Dr. Jenkins Rouge last seen in 10/2017  Chest x-ray - N/A EKG - 10/13/20 in epic Stress Test -  Greater than 2years ECHO - N/A Cardiac Cath -  Greater than 2years Pacemaker/ICD device last checked:N/A  Sleep Study - N/A CPAP -N/A   Fasting Blood Sugar - N/A Checks Blood Sugar _____ times a day N/A Blood Thinner Instructions: N/A Aspirin Instructions:N/A Last Dose:N/A  Activity level:  Can go up a flight of stairs and activities of daily living without stopping and without symptoms        Anesthesia review:   Patient denies shortness of breath, fever, cough and chest pain at PAT appointment   Patient verbalized understanding of instructions that were given to them at the PAT appointment. Patient was also instructed that they will need to review over the PAT instructions again at home before surgery.

## 2020-11-07 ENCOUNTER — Telehealth: Payer: Self-pay

## 2020-11-07 NOTE — Telephone Encounter (Signed)
Danielle Knapp states that she understands her written pre-op instructions from 11-06-20. She has no questions or concerns at this time.

## 2020-11-08 ENCOUNTER — Telehealth: Payer: Self-pay

## 2020-11-08 ENCOUNTER — Ambulatory Visit: Payer: Self-pay | Admitting: Obstetrics and Gynecology

## 2020-11-08 NOTE — H&P (Signed)
Patient is a 65 year old white female whom I saw as intraoperative consultation 1 week ago during endometrial procedure by Dr. Quincy Simmonds of gynecology. During her procedure she performed a cystoscopy to assess ureteral efflux and was found to have what appeared to be a papillary lesion in the bladder. I was called to come look at that and it appeared that she had small raised papillary lesion near the right ureteral orifice which had the appearance of a low-grade papillary urothelial carcinoma. She is here now for follow-up. She apparently was found to have endometrial cancer and is scheduled to see the GYN Oncology physicians on 11/03/2020. His unclear what procedure she is going to need for that. The patient denies any history of gross hematuria. She is a nonsmoker.  Micro urinalysis today shows 3-10 RBCs     ALLERGIES: Ciprofloxacin erythramycine Flagyl    MEDICATIONS: Omeprazole  Amlodipine Besylate 10 mg tablet  Fluticasone Propionate  Ibuprofen  Losartan Potassium 100 mg tablet  Oxycodone-Acetaminophen  Sertraline Hcl 150 mg capsule  Zolpidem Tartrate 5 mg tablet     GU PSH: None   NON-GU PSH: Cataract surgery D&&C Tonsillectomy..     GU PMH: None   NON-GU PMH: Anxiety GERD Hypertension Skin Cancer, History    FAMILY HISTORY: None    Notes: mom deceased -COPD  father deceased-Stroke  grandfather-prostate cancer   SOCIAL HISTORY: Marital Status: Married Preferred Language: English; Ethnicity: Not Hispanic Or Latino; Race: White Current Smoking Status: Patient has never smoked.   Tobacco Use Assessment Completed: Used Tobacco in last 30 days? Does not use smokeless tobacco. Social Drinker.  Does not use drugs. Drinks 1 caffeinated drink per day. Has not had a blood transfusion.    REVIEW OF SYSTEMS:    GU Review Female:   Patient reports frequent urination, hard to postpone urination, and leakage of urine. Patient denies burning /pain with urination, get up at  night to urinate, stream starts and stops, trouble starting your stream, have to strain to urinate, and being pregnant.  Gastrointestinal (Upper):   Patient denies nausea, vomiting, and indigestion/ heartburn.  Gastrointestinal (Lower):   Patient denies constipation and diarrhea.  Constitutional:   Patient denies fever, night sweats, weight loss, and fatigue.  Skin:   Patient denies skin rash/ lesion and itching.  Eyes:   Patient reports blurred vision. Patient denies double vision.  Ears/ Nose/ Throat:   Patient reports sinus problems. Patient denies sore throat.  Hematologic/Lymphatic:   Patient denies swollen glands and easy bruising.  Cardiovascular:   Patient denies leg swelling and chest pains.  Respiratory:   Patient reports cough. Patient denies shortness of breath.  Endocrine:   Patient denies excessive thirst.  Musculoskeletal:   Patient denies back pain and joint pain.  Neurological:   Patient denies headaches and dizziness.  Psychologic:   Patient reports depression and anxiety.    VITAL SIGNS:      10/25/2020 11:13 AM  Weight 180 lb / 81.65 kg  Height 67 in / 170.18 cm  BP 152/89 mmHg  Pulse 87 /min  Temperature 98.6 F / 37 C  BMI 28.2 kg/m   MULTI-SYSTEM PHYSICAL EXAMINATION:    Constitutional: Well-nourished. No physical deformities. Normally developed. Good grooming.  Neck: Neck symmetrical, not swollen. Normal tracheal position.  Respiratory: No labored breathing, no use of accessory muscles.   Cardiovascular: Normal temperature, normal extremity pulses, no swelling, no varicosities.  Lymphatic: No enlargement of neck, axillae, groin.  Skin: No paleness, no jaundice, no cyanosis.  No lesion, no ulcer, no rash.  Neurologic / Psychiatric: Oriented to time, oriented to place, oriented to person. No depression, no anxiety, no agitation.  Eyes: Normal conjunctivae. Normal eyelids.  Ears, Nose, Mouth, and Throat: Left ear no scars, no lesions, no masses. Right ear no  scars, no lesions, no masses. Nose no scars, no lesions, no masses. Normal hearing. Normal lips.  Musculoskeletal: Normal gait and station of head and neck.     Complexity of Data:  Source Of History:  Patient  Records Review:   Previous Doctor Records, Previous Hospital Records, Previous Patient Records  Urine Test Review:   Urinalysis   PROCEDURES:          Urinalysis w/Scope - 81001 Dipstick Dipstick Cont'd Micro  Color: Yellow Bilirubin: Neg mg/dL WBC/hpf: 0 - 5/hpf  Appearance: Clear Ketones: Neg mg/dL RBC/hpf: 3 - 10/hpf  Specific Gravity: 1.010 Blood: 3+ ery/uL Bacteria: Rare (0-9/hpf)  pH: 7.5 Protein: Neg mg/dL Cystals: NS (Not Seen)  Glucose: Neg mg/dL Urobilinogen: 0.2 mg/dL Casts: NS (Not Seen)    Nitrites: Neg Trichomonas: Not Present    Leukocyte Esterase: Neg leu/uL Mucous: Not Present      Epithelial Cells: 0 - 5/hpf      Yeast: NS (Not Seen)      Sperm: Not Present    ASSESSMENT:      ICD-10 Details  1 GU:   Bladder tumor/neoplasm - D41.4 Acute, Complicated Injury   PLAN:           Document Letter(s):  Created for Patient: Clinical Summary         Notes:   I discussed treatment options for the patient. She need cysto retrogrades and TURBT. I we are going to wait on evaluation by GYN Oncology as we probably could perform this as a combined procedure as this is a very small lesion and could be easily removed fairly quickly. Risks and benefits the procedure were discussed in detail as outlined below. We will wait and here from GYN Oncology regarding perhaps timing of a combined procedure.  TURBT consent: I have discussed with the patient the risks, benefits of TURBT which include but are not limited to: Bleeding, infection, damage to the bladder with potential perforation of the bladder, damage to surrounding organs, possible need for further procedures including open repair and catheterization, possibility of nonhealing area within the bladder, urgency, frequency  which may be refractory to medications. I pointed out that in some occasions after resection of the bladder tumor, mitomycin-C chemotherapy may be instilled into the bladder. The risks associated with this therapy include but are not limited to: Refractory or new onset urgency, frequency, dysuria, infrequently severe systemic side effects secondary to mitomycin-C. After full discussion of the risks, benefits and alternatives, the patient has consented to the above procedure and desires to proceed.

## 2020-11-08 NOTE — Anesthesia Preprocedure Evaluation (Addendum)
Anesthesia Evaluation  Patient identified by MRN, date of birth, ID band Patient awake    Reviewed: Allergy & Precautions, NPO status , Patient's Chart, lab work & pertinent test results  Airway Mallampati: II  TM Distance: >3 FB Neck ROM: Full    Dental  (+) Teeth Intact   Pulmonary    Pulmonary exam normal        Cardiovascular hypertension, + CAD   Rhythm:Regular Rate:Normal     Neuro/Psych Depression    GI/Hepatic GERD  Medicated,diverticulitis   Endo/Other  negative endocrine ROS  Renal/GU negative Renal ROS Bladder dysfunction Female GU complaint Endometrial and bladder cancer    Musculoskeletal  (+) Arthritis , Osteoarthritis,    Abdominal (+)  Abdomen: soft. Bowel sounds: normal.  Peds  Hematology negative hematology ROS (+)   Anesthesia Other Findings   Reproductive/Obstetrics                            Anesthesia Physical Anesthesia Plan  ASA: III  Anesthesia Plan: General   Post-op Pain Management:    Induction: Intravenous  PONV Risk Score and Plan: 3 and Ondansetron, Dexamethasone, Midazolam and Treatment may vary due to age or medical condition  Airway Management Planned: Mask and Oral ETT  Additional Equipment: None  Intra-op Plan:   Post-operative Plan: Extubation in OR  Informed Consent: I have reviewed the patients History and Physical, chart, labs and discussed the procedure including the risks, benefits and alternatives for the proposed anesthesia with the patient or authorized representative who has indicated his/her understanding and acceptance.     Dental advisory given  Plan Discussed with: CRNA  Anesthesia Plan Comments: (CATH 2019:  Prox LAD lesion is 10% stenosed.  Mid Cx lesion is 10% stenosed.  Dist RCA lesion is 10% stenosed.  The left ventricular systolic function is normal.  LV end diastolic pressure is normal.  The left  ventricular ejection fraction is 55-65% by visual estimate.  There is no aortic valve stenosis.   Minimal nonobstructive CAD.  Investigate other causes of chest pain  ECHO 2019: Study Conclusions  - Left ventricle: The cavity size was normal. Wall thickness was  normal. Systolic function was normal. The estimated ejection  fraction was in the range of 60% to 65%. Wall motion was normal;  there were no regional wall motion abnormalities. Doppler  parameters are consistent with abnormal left ventricular  relaxation (grade 1 diastolic dysfunction). The E/e&' ratio is  between 8-15, suggesting indeterminate LV filling pressure.  - Mitral valve: Mildly thickened leaflets . There was trivial  regurgitation.  - Left atrium: The atrium was normal in size.  - Right ventricle: The cavity size was normal. Wall thickness was  normal. Systolic function was normal.  - Right atrium: The atrium was normal in size.  - Inferior vena cava: The vessel was dilated. The respirophasic  diameter changes were blunted (< 50%), consistent with elevated  central venous pressure.  Lab Results      Component                Value               Date                      WBC                      8.4  11/06/2020                HGB                      16.3 (H)            11/06/2020                HCT                      45.6                11/06/2020                MCV                      87.9                11/06/2020                PLT                      269                 11/06/2020           Lab Results      Component                Value               Date                      NA                       141                 11/06/2020                K                        3.9                 11/06/2020                CO2                      25                  11/06/2020                GLUCOSE                  79                  11/06/2020                BUN                       14                  11/06/2020                CREATININE               0.69                11/06/2020  CALCIUM                  9.1                 11/06/2020                GFRNONAA                 >60                 11/06/2020                GFRAA                    >60                 10/25/2019          )     Anesthesia Quick Evaluation

## 2020-11-08 NOTE — Telephone Encounter (Signed)
Pre surgical call to patient.  No answer, left message to return call.

## 2020-11-09 ENCOUNTER — Encounter (HOSPITAL_COMMUNITY): Payer: Self-pay | Admitting: Gynecologic Oncology

## 2020-11-09 ENCOUNTER — Ambulatory Visit (HOSPITAL_COMMUNITY): Payer: 59 | Admitting: Certified Registered Nurse Anesthetist

## 2020-11-09 ENCOUNTER — Ambulatory Visit (HOSPITAL_COMMUNITY)
Admission: RE | Admit: 2020-11-09 | Discharge: 2020-11-09 | Disposition: A | Discharge status: Home / Self Care | Payer: 59 | Attending: Gynecologic Oncology | Admitting: Gynecologic Oncology

## 2020-11-09 ENCOUNTER — Encounter (HOSPITAL_COMMUNITY)
Admission: RE | Disposition: A | Discharge status: Home / Self Care | Payer: Self-pay | Source: Home / Self Care | Attending: Gynecologic Oncology

## 2020-11-09 ENCOUNTER — Other Ambulatory Visit: Payer: Self-pay

## 2020-11-09 DIAGNOSIS — D360 Benign neoplasm of lymph nodes: Secondary | ICD-10-CM | POA: Insufficient documentation

## 2020-11-09 DIAGNOSIS — C569 Malignant neoplasm of unspecified ovary: Secondary | ICD-10-CM | POA: Diagnosis not present

## 2020-11-09 DIAGNOSIS — I10 Essential (primary) hypertension: Secondary | ICD-10-CM | POA: Insufficient documentation

## 2020-11-09 DIAGNOSIS — Z85828 Personal history of other malignant neoplasm of skin: Secondary | ICD-10-CM | POA: Diagnosis not present

## 2020-11-09 DIAGNOSIS — Z881 Allergy status to other antibiotic agents status: Secondary | ICD-10-CM | POA: Insufficient documentation

## 2020-11-09 DIAGNOSIS — D494 Neoplasm of unspecified behavior of bladder: Secondary | ICD-10-CM | POA: Diagnosis present

## 2020-11-09 DIAGNOSIS — C541 Malignant neoplasm of endometrium: Secondary | ICD-10-CM | POA: Diagnosis not present

## 2020-11-09 DIAGNOSIS — C679 Malignant neoplasm of bladder, unspecified: Secondary | ICD-10-CM | POA: Diagnosis not present

## 2020-11-09 DIAGNOSIS — Z8042 Family history of malignant neoplasm of prostate: Secondary | ICD-10-CM | POA: Diagnosis not present

## 2020-11-09 HISTORY — PX: ROBOTIC ASSISTED TOTAL HYSTERECTOMY WITH BILATERAL SALPINGO OOPHERECTOMY: SHX6086

## 2020-11-09 HISTORY — PX: TRANSURETHRAL RESECTION OF BLADDER TUMOR: SHX2575

## 2020-11-09 HISTORY — PX: LYMPH NODE DISSECTION: SHX5087

## 2020-11-09 HISTORY — PX: CYSTOSCOPY: SHX5120

## 2020-11-09 HISTORY — DX: Malignant neoplasm of endometrium: C54.1

## 2020-11-09 LAB — TYPE AND SCREEN
ABO/RH(D): A POS
Antibody Screen: NEGATIVE

## 2020-11-09 SURGERY — HYSTERECTOMY, TOTAL, ROBOT-ASSISTED, LAPAROSCOPIC, WITH BILATERAL SALPINGO-OOPHORECTOMY
Anesthesia: General

## 2020-11-09 MED ORDER — LIDOCAINE 2% (20 MG/ML) 5 ML SYRINGE
INTRAMUSCULAR | Status: AC
  Filled 2020-11-09: qty 5

## 2020-11-09 MED ORDER — STERILE WATER FOR IRRIGATION IR SOLN
Status: DC | PRN
  Administered 2020-11-09: 1000 mL

## 2020-11-09 MED ORDER — LIDOCAINE 2% (20 MG/ML) 5 ML SYRINGE
INTRAMUSCULAR | Status: AC
  Filled 2020-11-09: qty 10

## 2020-11-09 MED ORDER — SUGAMMADEX SODIUM 200 MG/2ML IV SOLN
INTRAVENOUS | Status: DC | PRN
  Administered 2020-11-09: 200 mg via INTRAVENOUS

## 2020-11-09 MED ORDER — CELECOXIB 200 MG PO CAPS
400.0000 mg | ORAL_CAPSULE | ORAL | Status: AC
  Administered 2020-11-09: 400 mg via ORAL
  Filled 2020-11-09: qty 2

## 2020-11-09 MED ORDER — STERILE WATER FOR INJECTION IJ SOLN
INTRAMUSCULAR | Status: AC
  Filled 2020-11-09: qty 10

## 2020-11-09 MED ORDER — STERILE WATER FOR IRRIGATION IR SOLN
Status: DC | PRN
  Administered 2020-11-09: 3000 mL

## 2020-11-09 MED ORDER — AMISULPRIDE (ANTIEMETIC) 5 MG/2ML IV SOLN: 10.0000 mg | Freq: Once | INTRAVENOUS | Status: DC | PRN

## 2020-11-09 MED ORDER — HYDROMORPHONE HCL 1 MG/ML IJ SOLN
INTRAMUSCULAR | Status: AC
  Filled 2020-11-09: qty 1

## 2020-11-09 MED ORDER — HEPARIN SODIUM (PORCINE) 5000 UNIT/ML IJ SOLN
5000.0000 [IU] | INTRAMUSCULAR | Status: AC
  Administered 2020-11-09: 5000 [IU] via SUBCUTANEOUS
  Filled 2020-11-09: qty 1

## 2020-11-09 MED ORDER — GLYCOPYRROLATE PF 0.2 MG/ML IJ SOSY
PREFILLED_SYRINGE | INTRAMUSCULAR | Status: DC | PRN
  Administered 2020-11-09: .2 mg via INTRAVENOUS

## 2020-11-09 MED ORDER — SCOPOLAMINE 1 MG/3DAYS TD PT72
1.0000 | MEDICATED_PATCH | TRANSDERMAL | Status: DC
  Administered 2020-11-09: 1.5 mg via TRANSDERMAL
  Filled 2020-11-09: qty 1

## 2020-11-09 MED ORDER — ROCURONIUM BROMIDE 10 MG/ML (PF) SYRINGE
PREFILLED_SYRINGE | INTRAVENOUS | Status: AC
  Filled 2020-11-09: qty 10

## 2020-11-09 MED ORDER — GABAPENTIN 100 MG PO CAPS
200.0000 mg | ORAL_CAPSULE | ORAL | Status: AC
  Administered 2020-11-09: 200 mg via ORAL
  Filled 2020-11-09: qty 2

## 2020-11-09 MED ORDER — STERILE WATER FOR INJECTION IJ SOLN
INTRAMUSCULAR | Status: DC | PRN
  Administered 2020-11-09: 4 mL

## 2020-11-09 MED ORDER — ONDANSETRON HCL 4 MG/2ML IJ SOLN
INTRAMUSCULAR | Status: DC | PRN
  Administered 2020-11-09: 4 mg via INTRAVENOUS

## 2020-11-09 MED ORDER — EPHEDRINE SULFATE-NACL 50-0.9 MG/10ML-% IV SOSY
PREFILLED_SYRINGE | INTRAVENOUS | Status: DC | PRN
  Administered 2020-11-09: 10 mg via INTRAVENOUS

## 2020-11-09 MED ORDER — GLYCOPYRROLATE PF 0.2 MG/ML IJ SOSY
PREFILLED_SYRINGE | INTRAMUSCULAR | Status: AC
  Filled 2020-11-09: qty 1

## 2020-11-09 MED ORDER — CEFAZOLIN SODIUM-DEXTROSE 2-4 GM/100ML-% IV SOLN
2.0000 g | INTRAVENOUS | Status: DC
  Filled 2020-11-09: qty 100

## 2020-11-09 MED ORDER — PROPOFOL 10 MG/ML IV BOLUS
INTRAVENOUS | Status: DC | PRN
  Administered 2020-11-09: 130 mg via INTRAVENOUS

## 2020-11-09 MED ORDER — ALBUTEROL SULFATE HFA 108 (90 BASE) MCG/ACT IN AERS
INHALATION_SPRAY | RESPIRATORY_TRACT | Status: AC
  Filled 2020-11-09: qty 6.7

## 2020-11-09 MED ORDER — LACTATED RINGERS IV SOLN: INTRAVENOUS | Status: DC

## 2020-11-09 MED ORDER — OXYCODONE HCL 5 MG/5ML PO SOLN: 5.0000 mg | Freq: Once | ORAL | Status: AC | PRN

## 2020-11-09 MED ORDER — ONDANSETRON HCL 4 MG/2ML IJ SOLN
INTRAMUSCULAR | Status: AC
  Filled 2020-11-09: qty 2

## 2020-11-09 MED ORDER — KETAMINE HCL 10 MG/ML IJ SOLN
INTRAMUSCULAR | Status: DC | PRN
  Administered 2020-11-09: 30 mg via INTRAVENOUS

## 2020-11-09 MED ORDER — LACTATED RINGERS IR SOLN
Status: DC | PRN
  Administered 2020-11-09: 1000 mL

## 2020-11-09 MED ORDER — BUPIVACAINE HCL 0.25 % IJ SOLN
INTRAMUSCULAR | Status: DC | PRN
  Administered 2020-11-09: 30 mL

## 2020-11-09 MED ORDER — FENTANYL CITRATE (PF) 250 MCG/5ML IJ SOLN
INTRAMUSCULAR | Status: AC
  Filled 2020-11-09: qty 5

## 2020-11-09 MED ORDER — BUPIVACAINE HCL 0.25 % IJ SOLN
INTRAMUSCULAR | Status: AC
  Filled 2020-11-09: qty 1

## 2020-11-09 MED ORDER — HYDROMORPHONE HCL 1 MG/ML IJ SOLN
INTRAMUSCULAR | Status: AC
  Administered 2020-11-09: 0.5 mg via INTRAVENOUS
  Filled 2020-11-09: qty 1

## 2020-11-09 MED ORDER — SODIUM CHLORIDE 0.9 % IR SOLN
Status: DC | PRN
  Administered 2020-11-09: 1000 mL

## 2020-11-09 MED ORDER — ROCURONIUM BROMIDE 10 MG/ML (PF) SYRINGE
PREFILLED_SYRINGE | INTRAVENOUS | Status: DC | PRN
  Administered 2020-11-09 (×2): 20 mg via INTRAVENOUS
  Administered 2020-11-09: 60 mg via INTRAVENOUS
  Administered 2020-11-09: 20 mg via INTRAVENOUS

## 2020-11-09 MED ORDER — ACETAMINOPHEN 500 MG PO TABS
1000.0000 mg | ORAL_TABLET | ORAL | Status: AC
  Administered 2020-11-09: 1000 mg via ORAL
  Filled 2020-11-09: qty 2

## 2020-11-09 MED ORDER — OXYCODONE HCL 5 MG PO TABS: 5.0000 mg | ORAL_TABLET | Freq: Once | ORAL | Status: AC | PRN

## 2020-11-09 MED ORDER — DEXAMETHASONE SODIUM PHOSPHATE 10 MG/ML IJ SOLN
INTRAMUSCULAR | Status: AC
  Filled 2020-11-09: qty 1

## 2020-11-09 MED ORDER — LACTATED RINGERS IV SOLN: INTRAVENOUS | Status: DC | PRN

## 2020-11-09 MED ORDER — PROPOFOL 10 MG/ML IV BOLUS
INTRAVENOUS | Status: AC
  Filled 2020-11-09: qty 20

## 2020-11-09 MED ORDER — FENTANYL CITRATE (PF) 100 MCG/2ML IJ SOLN
INTRAMUSCULAR | Status: DC | PRN
  Administered 2020-11-09 (×7): 50 ug via INTRAVENOUS
  Administered 2020-11-09: 100 ug via INTRAVENOUS

## 2020-11-09 MED ORDER — CEFAZOLIN SODIUM-DEXTROSE 2-4 GM/100ML-% IV SOLN
2.0000 g | INTRAVENOUS | Status: AC
  Administered 2020-11-09: 2 g via INTRAVENOUS

## 2020-11-09 MED ORDER — KETAMINE HCL 10 MG/ML IJ SOLN
INTRAMUSCULAR | Status: AC
  Filled 2020-11-09: qty 1

## 2020-11-09 MED ORDER — LIDOCAINE 2% (20 MG/ML) 5 ML SYRINGE
INTRAMUSCULAR | Status: DC | PRN
  Administered 2020-11-09: 1.5 mg/kg/h via INTRAVENOUS
  Administered 2020-11-09: 80 mg via INTRAVENOUS

## 2020-11-09 MED ORDER — STERILE WATER FOR INJECTION IJ SOLN
INTRAMUSCULAR | Status: DC | PRN
  Administered 2020-11-09: 10 mL

## 2020-11-09 MED ORDER — PHENYLEPHRINE HCL-NACL 10-0.9 MG/250ML-% IV SOLN
INTRAVENOUS | Status: AC
  Filled 2020-11-09: qty 250

## 2020-11-09 MED ORDER — MIDAZOLAM HCL 2 MG/2ML IJ SOLN
INTRAMUSCULAR | Status: AC
  Filled 2020-11-09: qty 2

## 2020-11-09 MED ORDER — OXYCODONE HCL 5 MG PO TABS
ORAL_TABLET | ORAL | Status: AC
  Administered 2020-11-09: 5 mg via ORAL
  Filled 2020-11-09: qty 1

## 2020-11-09 MED ORDER — FENTANYL CITRATE (PF) 100 MCG/2ML IJ SOLN
INTRAMUSCULAR | Status: AC
  Filled 2020-11-09: qty 2

## 2020-11-09 MED ORDER — DEXAMETHASONE SODIUM PHOSPHATE 4 MG/ML IJ SOLN
4.0000 mg | INTRAMUSCULAR | Status: AC
  Administered 2020-11-09: 6 mg via INTRAVENOUS

## 2020-11-09 MED ORDER — MIDAZOLAM HCL 5 MG/5ML IJ SOLN
INTRAMUSCULAR | Status: DC | PRN
  Administered 2020-11-09: 2 mg via INTRAVENOUS

## 2020-11-09 MED ORDER — PROMETHAZINE HCL 25 MG/ML IJ SOLN: 6.2500 mg | INTRAMUSCULAR | Status: DC | PRN

## 2020-11-09 MED ORDER — HYDROMORPHONE HCL 1 MG/ML IJ SOLN
0.2500 mg | INTRAMUSCULAR | Status: DC | PRN
  Administered 2020-11-09 (×3): 0.5 mg via INTRAVENOUS

## 2020-11-09 MED ORDER — DEXMEDETOMIDINE (PRECEDEX) IN NS 20 MCG/5ML (4 MCG/ML) IV SYRINGE
PREFILLED_SYRINGE | INTRAVENOUS | Status: DC | PRN
  Administered 2020-11-09: 4 ug via INTRAVENOUS
  Administered 2020-11-09 (×2): 8 ug via INTRAVENOUS

## 2020-11-09 SURGICAL SUPPLY — 95 items
ADH SKN CLS APL DERMABOND .7 (GAUZE/BANDAGES/DRESSINGS) ×4
AGENT HMST KT MTR STRL THRMB (HEMOSTASIS) ×4
APL ESCP 34 STRL LF DISP (HEMOSTASIS) ×2
APPLICATOR SURGIFLO ENDO (HEMOSTASIS) ×1 IMPLANT
BACTOSHIELD CHG 4% 4OZ (MISCELLANEOUS) ×1
BAG DRN RND TRDRP ANRFLXCHMBR (UROLOGICAL SUPPLIES)
BAG LAPAROSCOPIC 12 15 PORT 16 (BASKET) IMPLANT
BAG RETRIEVAL 12/15 (BASKET)
BAG SPEC RTRVL LRG 6X4 10 (ENDOMECHANICALS) ×2
BAG URINE DRAIN 2000ML AR STRL (UROLOGICAL SUPPLIES) IMPLANT
BAG URO CATCHER STRL LF (MISCELLANEOUS) ×3 IMPLANT
BLADE SURG SZ10 CARB STEEL (BLADE) ×1 IMPLANT
BULB IRRIG PATHFIND (MISCELLANEOUS) IMPLANT
CATH INTERMIT  6FR 70CM (CATHETERS) ×2 IMPLANT
CLOTH BEACON ORANGE TIMEOUT ST (SAFETY) ×2 IMPLANT
COVER BACK TABLE 60X90IN (DRAPES) ×3 IMPLANT
COVER TIP SHEARS 8 DVNC (MISCELLANEOUS) ×2 IMPLANT
COVER TIP SHEARS 8MM DA VINCI (MISCELLANEOUS) ×3
COVER WAND RF STERILE (DRAPES) IMPLANT
DECANTER SPIKE VIAL GLASS SM (MISCELLANEOUS) ×2 IMPLANT
DERMABOND ADVANCED (GAUZE/BANDAGES/DRESSINGS) ×2
DERMABOND ADVANCED .7 DNX12 (GAUZE/BANDAGES/DRESSINGS) ×2 IMPLANT
DRAPE ARM DVNC X/XI (DISPOSABLE) ×8 IMPLANT
DRAPE COLUMN DVNC XI (DISPOSABLE) ×2 IMPLANT
DRAPE DA VINCI XI ARM (DISPOSABLE) ×12
DRAPE DA VINCI XI COLUMN (DISPOSABLE) ×3
DRAPE FOOT SWITCH (DRAPES) ×2 IMPLANT
DRAPE SHEET LG 3/4 BI-LAMINATE (DRAPES) ×3 IMPLANT
DRAPE SURG IRRIG POUCH 19X23 (DRAPES) ×3 IMPLANT
DRAPE UNDERBUTTOCKS STRL (DISPOSABLE) ×3 IMPLANT
DRSG OPSITE POSTOP 4X6 (GAUZE/BANDAGES/DRESSINGS) ×1 IMPLANT
DRSG OPSITE POSTOP 4X8 (GAUZE/BANDAGES/DRESSINGS) IMPLANT
ELECT PENCIL ROCKER SW 15FT (MISCELLANEOUS) ×1 IMPLANT
ELECT REM PT RETURN 15FT ADLT (MISCELLANEOUS) ×4 IMPLANT
GLOVE SURG ENC MOIS LTX SZ6 (GLOVE) ×12 IMPLANT
GLOVE SURG ENC MOIS LTX SZ6.5 (GLOVE) ×6 IMPLANT
GLOVE SURG ENC TEXT LTX SZ7.5 (GLOVE) ×3 IMPLANT
GOWN STRL REUS W/ TWL LRG LVL3 (GOWN DISPOSABLE) ×8 IMPLANT
GOWN STRL REUS W/TWL LRG LVL3 (GOWN DISPOSABLE) ×18 IMPLANT
GOWN STRL REUS W/TWL XL LVL3 (GOWN DISPOSABLE) ×3 IMPLANT
GUIDEWIRE STR DUAL SENSOR (WIRE) ×2 IMPLANT
HOLDER FOLEY CATH W/STRAP (MISCELLANEOUS) ×2 IMPLANT
IRRIG SUCT STRYKERFLOW 2 WTIP (MISCELLANEOUS) ×3
IRRIGATION SUCT STRKRFLW 2 WTP (MISCELLANEOUS) ×2 IMPLANT
KIT PROCEDURE DA VINCI SI (MISCELLANEOUS) ×3
KIT PROCEDURE DVNC SI (MISCELLANEOUS) IMPLANT
KIT TURNOVER KIT A (KITS) ×6 IMPLANT
LOOP CUT BIPOLAR 24F LRG (ELECTROSURGICAL) IMPLANT
MANIFOLD NEPTUNE II (INSTRUMENTS) ×3 IMPLANT
MANIPULATOR UTERINE 4.5 ZUMI (MISCELLANEOUS) ×3 IMPLANT
NDL HYPO 21X1.5 SAFETY (NEEDLE) ×2 IMPLANT
NDL SPNL 18GX3.5 QUINCKE PK (NEEDLE) IMPLANT
NEEDLE HYPO 21X1.5 SAFETY (NEEDLE) ×3 IMPLANT
NEEDLE SPNL 18GX3.5 QUINCKE PK (NEEDLE) ×3 IMPLANT
NS IRRIG 1000ML POUR BTL (IV SOLUTION) ×3 IMPLANT
OBTURATOR OPTICAL STANDARD 8MM (TROCAR) ×3
OBTURATOR OPTICAL STND 8 DVNC (TROCAR) ×2
OBTURATOR OPTICALSTD 8 DVNC (TROCAR) ×2 IMPLANT
PACK CYSTO (CUSTOM PROCEDURE TRAY) ×3 IMPLANT
PACK ROBOT GYN CUSTOM WL (TRAY / TRAY PROCEDURE) ×3 IMPLANT
PAD POSITIONING PINK XL (MISCELLANEOUS) ×3 IMPLANT
PENCIL SMOKE EVACUATOR (MISCELLANEOUS) IMPLANT
PORT ACCESS TROCAR AIRSEAL 12 (TROCAR) ×2 IMPLANT
PORT ACCESS TROCAR AIRSEAL 5M (TROCAR) ×1
POUCH SPECIMEN RETRIEVAL 10MM (ENDOMECHANICALS) ×1 IMPLANT
SCRUB CHG 4% DYNA-HEX 4OZ (MISCELLANEOUS) ×2 IMPLANT
SEAL CANN UNIV 5-8 DVNC XI (MISCELLANEOUS) ×8 IMPLANT
SEAL XI 5MM-8MM UNIVERSAL (MISCELLANEOUS) ×12
SET TRI-LUMEN FLTR TB AIRSEAL (TUBING) ×3 IMPLANT
SPONGE LAP 18X18 RF (DISPOSABLE) ×1 IMPLANT
SURGIFLO W/THROMBIN 8M KIT (HEMOSTASIS) ×2 IMPLANT
SUT MNCRL AB 4-0 PS2 18 (SUTURE) ×1 IMPLANT
SUT PDS AB 1 TP1 96 (SUTURE) ×2 IMPLANT
SUT VIC AB 0 CT1 27 (SUTURE)
SUT VIC AB 0 CT1 27XBRD ANTBC (SUTURE) IMPLANT
SUT VIC AB 2-0 CT1 27 (SUTURE)
SUT VIC AB 2-0 CT1 TAPERPNT 27 (SUTURE) IMPLANT
SUT VIC AB 2-0 SH 27 (SUTURE) ×3
SUT VIC AB 2-0 SH 27X BRD (SUTURE) IMPLANT
SUT VIC AB 4-0 PS2 18 (SUTURE) ×7 IMPLANT
SYR 10ML LL (SYRINGE) ×1 IMPLANT
SYR TOOMEY IRRIG 70ML (MISCELLANEOUS)
SYRINGE TOOMEY IRRIG 70ML (MISCELLANEOUS) IMPLANT
SYS RETRIEVAL 5MM INZII UNIV (BASKET) ×6
SYSTEM RETRIEVL 5MM INZII UNIV (BASKET) IMPLANT
TOWEL OR 17X26 10 PK STRL BLUE (TOWEL DISPOSABLE) ×3 IMPLANT
TOWEL OR NON WOVEN STRL DISP B (DISPOSABLE) ×3 IMPLANT
TRAP SPECIMEN MUCUS 40CC (MISCELLANEOUS) IMPLANT
TRAY FOLEY MTR SLVR 16FR STAT (SET/KITS/TRAYS/PACK) ×3 IMPLANT
TROCAR XCEL NON-BLD 5MMX100MML (ENDOMECHANICALS) IMPLANT
TUBING CONNECTING 10 (TUBING) ×3 IMPLANT
TUBING UROLOGY SET (TUBING) ×2 IMPLANT
WATER STERILE IRR 1000ML POUR (IV SOLUTION) ×3 IMPLANT
YANKAUER SUCT BULB TIP 10FT TU (MISCELLANEOUS) ×1 IMPLANT
YANKAUER SUCT BULB TIP NO VENT (SUCTIONS) ×1 IMPLANT

## 2020-11-09 NOTE — Interval H&P Note (Signed)
History and Physical Interval Note:  11/09/2020 6:55 AM  Danielle Knapp  has presented today for surgery, with the diagnosis of ENDOMETRIAL CANCER, BLADDER CANCER.  The various methods of treatment have been discussed with the patient and family. After consideration of risks, benefits and other options for treatment, the patient has consented to  Procedure(s) with comments: DILATATION AND CURETTAGE (N/A) XI ROBOTIC ASSISTED TOTAL HYSTERECTOMY WITH BILATERAL SALPINGO OOPHORECTOMY, POSSIBLE LAPAROTOMY (Bilateral) SENTINEL NODE BIOPSY (N/A) POSSIBLE LYMPH NODE DISSECTION (N/A) TRANSURETHRAL RESECTION OF BLADDER TUMOR (TURBT) (N/A) CYSTOSCOPY (N/A) - Dr. [place] Cage needs to go first. [name] and rigid bx forceps. as a surgical intervention.  The patient's history has been reviewed, patient examined, no change in status, stable for surgery.  I have reviewed the patient's chart and labs.  Questions were answered to the patient's satisfaction.     Lafonda Mosses

## 2020-11-09 NOTE — Interval H&P Note (Signed)
History and Physical Interval Note:  11/09/2020 7:11 AM  Danielle Knapp  has presented today for surgery, with the diagnosis of ENDOMETRIAL CANCER, BLADDER CANCER.  The various methods of treatment have been discussed with the patient and family. After consideration of risks, benefits and other options for treatment, the patient has consented to  Procedure(s) with comments: DILATATION AND CURETTAGE (N/A) XI ROBOTIC ASSISTED TOTAL HYSTERECTOMY WITH BILATERAL SALPINGO OOPHORECTOMY, POSSIBLE LAPAROTOMY (Bilateral) SENTINEL NODE BIOPSY (N/A) POSSIBLE LYMPH NODE DISSECTION (N/A) TRANSURETHRAL RESECTION OF BLADDER TUMOR (TURBT) (N/A) CYSTOSCOPY (N/A) - Dr. [place] Cage needs to go first. [name] and rigid bx forceps. as a surgical intervention.  The patient's history has been reviewed, patient examined, no change in status, stable for surgery.  I have reviewed the patient's chart and labs.  Questions were answered to the patient's satisfaction.     Remi Haggard

## 2020-11-09 NOTE — Anesthesia Postprocedure Evaluation (Signed)
Anesthesia Post Note  Patient: Danielle Knapp  Procedure(s) Performed: XI ROBOTIC ASSISTED TOTAL HYSTERECTOMY WITH BILATERAL SALPINGO OOPHORECTOMY, (Bilateral ) BILATERAL LYMPH NODE DISSECTION;MINI LAPAROTOMY (N/A ) TRANSURETHRAL RESECTION OF BLADDER TUMOR (TURBT) (N/A ) CYSTOSCOPY (N/A )     Patient location during evaluation: PACU Anesthesia Type: General Level of consciousness: awake and alert Pain management: pain level controlled Vital Signs Assessment: post-procedure vital signs reviewed and stable Respiratory status: spontaneous breathing, nonlabored ventilation, respiratory function stable and patient connected to nasal cannula oxygen Cardiovascular status: blood pressure returned to baseline and stable Postop Assessment: no apparent nausea or vomiting Anesthetic complications: no   No complications documented.  Last Vitals:  Vitals:   11/09/20 1400 11/09/20 1451  BP: 128/88 124/72  Pulse: 86 64  Resp: 16 15  Temp:  36.4 C  SpO2: 98% 95%    Last Pain:  Vitals:   11/09/20 1451  TempSrc:   PainSc: 4                  Yulitza Shorts P Adelaida Reindel

## 2020-11-09 NOTE — Op Note (Signed)
Preoperative diagnosis:  1.  Bladder tumor  Postoperative diagnosis: 1.  Bladder tumor  Procedure(s): 1.  Cystoscopy, transurethral section of bladder tumor (small size)  Surgeon: Dr. Harold Barban  Anesthesia: General  Complications: None  EBL: Minimal  Specimens: Bladder tumor  Disposition of specimens: Bladder tumor specimen to pathology  Intraoperative findings: Patient had 2 small exophytic tumors first was inferior and just medial to the left ureteral orifice measuring about 1/2 to 1 cm in size.  Additional small tumor was lateral to it superior to the right ureteral orifice and was about 0.5 cm in size.  Both areas were removed utilizing rigid biopsy forceps to preserve pathologic integrity.  The bases were fulgurated with Bugbee electrode.  Good hemostasis ureteral orifice ease were not involved  Indication: Patient was noted to have incidental finding of probable bladder cancer at time of recent GYN procedure for endometrial cancer during cystoscopy.  Presents now for cystoscopy and TURBT  Description of procedure:  After obtaining form consent for the patient he was taken the major or suite placed under general anesthesia.  Placed in dorsolithotomy position genitalia prepped and draped in usual sterile fashion.  Proper pause and timeout was performed.  99 Fransico was advanced into the bladder without difficulty.  Bladder was thoroughly spread with 30 and 70 degree lenses with the above-noted findings.  In order to preserve pathologic integrity it was felt that these areas would be removed with rigid biopsy forceps.  Both these areas were removed completely with a rigid biopsy forceps and then the base was cauterized with Bugbee electrode with good hemostasis noted.  Care was taken to stay away from the ureteral orifice ease.  There was good E flux from both ureteral orifice ease following the biopsy and fulguration.  The bladder was emptied and the areas were reinspected with  good hemostasis.  Procedure was terminated.  Patient was stable at the termination of this portion of procedure.  She is scheduled to undergo hysterectomy by gynecology and this will be dictated under separate cover.

## 2020-11-09 NOTE — Brief Op Note (Signed)
11/09/2020  11:54 AM  PATIENT:  Danielle Knapp  65 y.o. female  PRE-OPERATIVE DIAGNOSIS:  ENDOMETRIAL CANCER, BLADDER CANCER  POST-OPERATIVE DIAGNOSIS:  ENDOMETRIAL CANCER, BLADDER CANCER  PROCEDURE:  Procedure(s) with comments: XI ROBOTIC ASSISTED TOTAL HYSTERECTOMY WITH BILATERAL SALPINGO OOPHORECTOMY, (Bilateral) BILATERAL LYMPH NODE DISSECTION;MINI LAPAROTOMY (N/A) TRANSURETHRAL RESECTION OF BLADDER TUMOR (TURBT) (N/A) CYSTOSCOPY (N/A) - Dr. Milford Cage needs to go first. [name] and rigid bx forceps.  SURGEON:  Surgeon(s) and Role: Panel 1:    * Lafonda Mosses, MD - Primary Panel 2:    * Remi Haggard, MD - Primary  PHYSICIAN ASSISTANT:   ASSISTANTS: Joylene John NP  ANESTHESIA:   general  EBL: 150cc  BLOOD ADMINISTERED: none  DRAINS: none   LOCAL MEDICATIONS USED:  MARCAINE     SPECIMEN:  Uterus, cervix, bilateral adnexa, bilateral pelvic LNs  DISPOSITION OF SPECIMEN:  PATHOLOGY  COUNTS:  YES  TOURNIQUET:  * No tourniquets in log *  DICTATION: .Note written in EPIC  PLAN OF CARE: Discharge to home after PACU  PATIENT DISPOSITION:  PACU - hemodynamically stable.   Delay start of Pharmacological VTE agent (>24hrs) due to surgical blood loss or risk of bleeding: not applicable

## 2020-11-09 NOTE — Brief Op Note (Signed)
Patient has completed phase 1 and is now in the bathroom attempting to void.  1350 Patient now voided and is sitting in chair in comfortable condition.  1415 Attempted to give discharge instructions to husband over the phone but he has difficulty hearing via telephone. Will get him next to patient.  1500 discharge instructions reviewed with patient and husband. Patient escorted in wheelchair to transport by RN Daleen Snook.

## 2020-11-09 NOTE — Anesthesia Procedure Notes (Signed)
Procedure Name: Intubation Date/Time: 11/09/2020 7:47 AM Performed by: West Pugh, CRNA Pre-anesthesia Checklist: Patient identified, Emergency Drugs available, Suction available, Patient being monitored and Timeout performed Patient Re-evaluated:Patient Re-evaluated prior to induction Oxygen Delivery Method: Circle system utilized Preoxygenation: Pre-oxygenation with 100% oxygen Induction Type: IV induction Ventilation: Mask ventilation without difficulty Laryngoscope Size: Mac and 3 Grade View: Grade I Tube type: Oral Tube size: 7.0 mm Number of attempts: 1 Airway Equipment and Method: Stylet Placement Confirmation: ETT inserted through vocal cords under direct vision,  positive ETCO2,  CO2 detector and breath sounds checked- equal and bilateral Secured at: 22 cm Tube secured with: Tape Dental Injury: Teeth and Oropharynx as per pre-operative assessment

## 2020-11-09 NOTE — Discharge Instructions (Signed)
11/09/2020  Return to work: 4-6 weeks if applicable  You will have a white honeycomb dressing over your larger incision. This dressing can be removed 5 days after surgery and you do not need to reapply a new dressing. Once you remove the dressing, you will notice that you have the surgical glue (dermabond) on the incision and this will peel off on its own. You can get this dressing wet in the shower the days after surgery prior to removal on the 5th day.  Activity: 1. Be up and out of the bed during the day.  Take a nap if needed.  You may walk up steps but be careful and use the hand rail.  Stair climbing will tire you more than you think, you may need to stop part way and rest.   2. No lifting or straining for 6 weeks.  3. No driving for around 1 week(s).  Do not drive if you are taking narcotic pain medicine.  4. Shower daily.  Use soap and water on your incision and pat dry; don't rub.  No tub baths until cleared by your surgeon.   5. No sexual activity and nothing in the vagina for 8 weeks.  6. You may experience a small amount of clear drainage from your incisions, which is normal.  If the drainage persists or increases, please call the office.  7. You may experience vaginal spotting after surgery or around the 6-8 week mark from surgery when the stitches at the top of the vagina begin to dissolve.  The spotting is normal but if you experience heavy bleeding, call our office.  8. Take Tylenol or ibuprofen first for pain and only use narcotic pain medication for severe pain not relieved by the Tylenol or Ibuprofen.  Monitor your Tylenol intake to a max of 4,000 mg.  Diet: 1. Low sodium Heart Healthy Diet is recommended.  2. It is safe to use a laxative, such as Miralax or Colace, if you have difficulty moving your bowels. You can take Sennakot at bedtime every evening to keep bowel movements regular and to prevent constipation.    Wound Care: 1. Keep clean and dry.  Shower  daily.  Reasons to call the Doctor:  Fever - Oral temperature greater than 100.4 degrees Fahrenheit  Foul-smelling vaginal discharge  Difficulty urinating  Nausea and vomiting  Increased pain at the site of the incision that is unrelieved with pain medicine.  Difficulty breathing with or without chest pain  New calf pain especially if only on one side  Sudden, continuing increased vaginal bleeding with or without clots.   Contacts: For questions or concerns you should contact:  Dr. Jeral Pinch at [phone]  Joylene John, NP at [phone]  After Hours: call [phone] and have the GYN Oncologist paged/contacted

## 2020-11-09 NOTE — Transfer of Care (Signed)
Immediate Anesthesia Transfer of Care Note  Patient: Danielle Knapp  Procedure(s) Performed: XI ROBOTIC ASSISTED TOTAL HYSTERECTOMY WITH BILATERAL SALPINGO OOPHORECTOMY, (Bilateral ) BILATERAL LYMPH NODE DISSECTION;MINI LAPAROTOMY (N/A ) TRANSURETHRAL RESECTION OF BLADDER TUMOR (TURBT) (N/A ) CYSTOSCOPY (N/A )  Patient Location: PACU  Anesthesia Type:General  Level of Consciousness: awake, drowsy and patient cooperative  Airway & Oxygen Therapy: Patient Spontanous Breathing and Patient connected to face mask oxygen  Post-op Assessment: Report given to RN and Post -op Vital signs reviewed and stable  Post vital signs: Reviewed and stable  Last Vitals:  Vitals Value Taken Time  BP 160/83 11/09/20 1148  Temp    Pulse 67 11/09/20 1154  Resp    SpO2 100 % 11/09/20 1154  Vitals shown include unvalidated device data.  Last Pain:  Vitals:   11/09/20 0605  TempSrc: Oral         Complications: No complications documented.

## 2020-11-09 NOTE — Op Note (Signed)
OPERATIVE NOTE  Pre-operative Diagnosis: endometrial cancer grade 1-2, bladder masses  Post-operative Diagnosis: same  Operation: Cystoscopy and biopsy with fulguration of papillary lesions (Dr. Milford Cage), Robotic-assisted laparoscopic total hysterectomy with bilateral salpingoophorectomy, bilateral pelvic LND, mini-lap for specimen removal  Surgeon: Jeral Pinch MD  Assistant Surgeon: Joylene John NP  Anesthesia: GET  Urine Output: 600cc  Operative Findings: On EUA, cervix difficult to distinguish, flush with apex; uterus small and mobile. Blood and some tumor noted on D&C. On intra-abdominal entry, normal upper abdominal exam including liver edge, diaphragm, stomach, small and large bowel, omentum. Bilateral ovaries atrophic appearing. Uterus 6-8cm and normal appearing. Mapping unsuccessful to bilateral pelvic basins. Significant retroperitoneal fibrosis noted and significant adhesions between the bladder and cervix. Several mildly prominent pelvic lymph nodes, no frank tumor involvement. No intra-abdominal or pelvic disease noted at the end of surgery.  Estimated Blood Loss:  150cc    Total IV Fluids: see I&O flowsheet         Specimens: uterus, cervix, bilateral tubes and ovaries, bilateral pelvic lymph nodes, bladder biopsies         Complications:  None apparent; patient tolerated the procedure well.         Disposition: PACU - hemodynamically stable.  Procedure Details  The patient was seen in the Holding Room. The risks, benefits, complications, treatment options, and expected outcomes were discussed with the patient.  The patient concurred with the proposed plan, giving informed consent.  The site of surgery properly noted/marked. The patient was identified as Danielle Knapp and the procedure verified as a Robotic-assisted hysterectomy with bilateral salpingo oophorectomy with SLN biopsy.   After induction of anesthesia, the patient was draped and prepped in the usual  sterile manner. Patient was placed in supine position after anesthesia and draped and prepped in the usual sterile manner as follows: Her arms were tucked to her side with all appropriate precautions.  The shoulders were stabilized with padded shoulder blocks applied to the acromium processes.  The patient was placed in the semi-lithotomy position in [place].    The procedure was first turned over to Dr. Milford Cage. At the completion of his portion of the surgery, the perineum and vagina were prepped with CholoraPrep. The patient was draped after the CholoraPrep had been allowed to dry for 3 minutes.  A Time Out was held and the above information confirmed.  The urethra was prepped with Betadine. Foley catheter was placed.  A sterile speculum was placed in the vagina.  The cervix was grasped with a single-tooth tenaculum. 2mg  total of ICG was injected into the cervical stroma at 2 and 9 o'clock with 1cc injected at a 1cm and 58mm depth (concentration 0.5mg /ml) in all locations. The cervix was dilated with Kennon Rounds dilators.  A curreage was performed to decompress the uterus. The ZUMI uterine manipulator with a small colpotomizer ring (given narrow introitus, small cervix and cervix flush with vaginal apex) was placed without difficulty.  A pneum occluder balloon was placed over the manipulator.  OG tube placement was confirmed and to suction.   Next, a 10 mm skin incision was made 1 cm below the subcostal margin in the midclavicular line.  The 5 mm Optiview port and scope was used for direct entry.  Opening pressure was under 10 mm CO2.  The abdomen was insufflated and the findings were noted as above.   At this point and all points during the procedure, the patient's intra-abdominal pressure did not exceed 15 mmHg. Next, an  8 mm skin incision was made superior the umbilicus and a right and left port were placed about 8 cm lateral to the robot port on the right and left side.  A fourth arm was placed on the  right.  The 5 mm assist trocar was exchanged for a 10-12 mm port. All ports were placed under direct visualization.  The patient was placed in steep Trendelenburg.  Bowel was folded away into the upper abdomen.  The robot was docked in the normal manner.  The right and left peritoneum were opened parallel to the IP ligament to open the retroperitoneal spaces bilaterally. The round ligaments were transected. The SLN mapping was performed in bilateral pelvic basins. After identifying the ureters, the para rectal and paravesical spaces were opened up entirely with careful dissection below the level of the ureters bilaterally and to the depth of the uterine artery origin in order to skeletonize the uterine "web" and ensure visualization of all parametrial channels. The para-aortic basins were carefully exposed and evaluated for isolated para-aortic SLN's. Mapping was not noted to have happened bilaterally.  The hysterectomy was started.  The ureter was again noted to be on the medial leaf of the broad ligament.  The peritoneum above the ureter was incised and stretched and the infundibulopelvic ligament was skeletonized, cauterized and cut.  The posterior peritoneum was taken down to the level of the KOH ring.  The anterior peritoneum was also taken down.  The bladder flap was created to the level of the KOH ring.  The uterine artery on the right side was skeletonized, cauterized and cut in the normal manner.  A similar procedure was performed on the left.  The colpotomy was made and the uterus, cervix, bilateral ovaries and tubes were amputated and placed in an Endocatch bag for removal later through a mini-lap.  Two areas of what appeared to be residual cervix were noted along the posterior cuff and these were excised using electrocautery. Pedicles were inspected and excellent hemostasis was achieved.    The colpotomy at the vaginal cuff was closed with Vicryl on a CT1 needle in a running manner.    A pelvic  lymph dissection was performed on the right with the following borders: proximally the bifurcation of the common iliac, distally the circumflex iliac vein, laterally the genitofemoral nerve, the medial border was the superior vesicle artery and the deep border was the obturator nerve. All lymphatic tissue was removed and sent to Pathology. A similar procedure was performed on the contralateral side.   Irrigation was used and excellent hemostasis was achieved.  At this point in the procedure was completed.  Robotic instruments were removed under direct visulaization.  The robot was undocked.   A mini-lap was performed with the abdomen still inflated by extending the umbilical incision to a length of 6cm. The incision was carried down to the peritoneum with electrocautery. While elevating the peritoneum, this incision was also extended. The various specimens, all in the bags, were removed through the incision and handed off the field.   The fascia at the mini-lap was closed with running looped #1 PDS tied in the midline. The subcutaneous tissue was irrigated and hemostasis assured. The subcutaneous tissue was reapproximated with 2-0 Vicryl. Local anesthesia was injected. The fascia at the 10-12 mm port was closed with 0 Vicryl on a UR-5 needle.  The subcuticular tissue at all incisions was closed with 4-0 Vicryl and the skin was closed with 4-0 Monocryl in a subcuticular manner.  Dermabond was applied.    The vagina was swabbed with  minimal bleeding noted.   All sponge, lap and needle counts were correct x  3. Foley catheter was removed.  The patient was transferred to the recovery room in stable condition.  Jeral Pinch, MD

## 2020-11-10 ENCOUNTER — Other Ambulatory Visit: Payer: Self-pay | Admitting: Gynecologic Oncology

## 2020-11-10 ENCOUNTER — Encounter (HOSPITAL_COMMUNITY): Payer: Self-pay | Admitting: Gynecologic Oncology

## 2020-11-10 ENCOUNTER — Telehealth: Payer: Self-pay

## 2020-11-10 DIAGNOSIS — C541 Malignant neoplasm of endometrium: Secondary | ICD-10-CM

## 2020-11-10 NOTE — Telephone Encounter (Signed)
LM for patient to call back to the office to discuss how she is doing after her surgery yesterday.

## 2020-11-10 NOTE — Progress Notes (Signed)
I called the patient and discussed final pathology results from yesterday. She is very happy with this news. We will have a phone visit next week to discuss further and to check on her progress from a postoperative standpoint.  Valarie Cones MD

## 2020-11-10 NOTE — Telephone Encounter (Signed)
Danielle Knapp states that she is eating,drinking, and urinating well. Passing gas. Will increase the senokot-s to 2 tabs bid with 8 oz of water.  If no BM by Sunday 11-12-20, She can add a capful of Miralax bid. She is having some vaginal spotting. No blood noted with urine.  Told her she may experience some blood in her urine from the TURBT performed yesterday.  If she experiencing any bright red blood or heavy bleeding she needs to call the office. Pt verbalized understanding. Afebrile. Incisions D&I She can remove the honeycomb dressing 5 days post op which is 11-14-20 and she can leave the incision open to air. Pt aware of post op appointments as well as the office number [phone] and after hours number [phone] to call if she has any questions or concerns

## 2020-11-13 ENCOUNTER — Telehealth: Payer: Self-pay | Admitting: Gynecologic Oncology

## 2020-11-13 LAB — SURGICAL PATHOLOGY

## 2020-11-13 NOTE — Telephone Encounter (Signed)
Called patient and discussed pathology from surgery. She is very happy with this news. We will keep our phone visit for next week.  Jeral Pinch MD Gynecologic Oncology

## 2020-11-14 ENCOUNTER — Telehealth: Payer: Self-pay

## 2020-11-14 NOTE — Telephone Encounter (Signed)
TC from patient inquiring when she should remove dressing from abdominal incision.  Surgery on 11/09/2020.  Patient instructed to remove dressing today as it has been 5 days.  Patient states she is doing well.  No other questions or concerns at this time.

## 2020-11-16 ENCOUNTER — Encounter: Payer: Self-pay | Admitting: Gynecologic Oncology

## 2020-11-16 ENCOUNTER — Inpatient Hospital Stay (HOSPITAL_BASED_OUTPATIENT_CLINIC_OR_DEPARTMENT_OTHER): Payer: 59 | Admitting: Gynecologic Oncology

## 2020-11-16 ENCOUNTER — Other Ambulatory Visit: Payer: Self-pay | Admitting: Internal Medicine

## 2020-11-16 DIAGNOSIS — C541 Malignant neoplasm of endometrium: Secondary | ICD-10-CM

## 2020-11-16 NOTE — Progress Notes (Signed)
Gynecologic Oncology Telehealth Consult Note: Gyn-Onc  I connected with Danielle Knapp on 11/16/20 at  3:30 PM EDT by telephone and verified that I am speaking with the correct person using two identifiers.  I discussed the limitations, risks, security and privacy concerns of performing an evaluation and management service by telemedicine and the availability of in-person appointments. I also discussed with the patient that there may be a patient responsible charge related to this service. The patient expressed understanding and agreed to proceed.  Other persons participating in the visit and their role in the encounter: None.  Patient's location: Home Provider's location: [facility]  Reason for Visit: Follow-up after surgery  Treatment History: Oncology History  Endometrial cancer (HCC)   Initial Diagnosis   Endometrial cancer (HCC)   09/27/2019 Imaging   CT A/P: 1. Large hypoenhancing mass in the region of the lower uterus/upper cervix. This appears to expand the endometrial canal the lower uterine segment although the endometrial canal in the uterine fundus is nondilated. As such, endometrial canal/cervical stricture is not considered likely. Rather, imaging features are highly concerning for neoplasm, likely of endometrial origin. 2. No findings of metastatic disease. Specifically, no evidence for lymphadenopathy in the abdomen/pelvis. No peritoneal or omental nodularity. No ascites. 3. Left colonic diverticulosis without diverticulitis   10/09/2020 Imaging   MRI pelvis: Distended endocervical canal, as described above, favoring a lower cervical stenosis/stricture.   Mild associated polypoid soft tissue/debris in the endometrial cavity and along the lower cervix, poorly evaluated on unenhanced MR. While the endometrial soft tissue favors debris, consider hysteroscopy to exclude a cervical mass resulting in Stenosis/stricture.   10/17/2020 Initial Biopsy    EMB: A. ENDOMETRIUM MASS, RESECTION:  - Endometrioid adenocarcinoma.  - See comment.   B. ENDOMETRIUM, CURETTAGE:  - Blood and scanty benign endocervical mucosa.  - No endometrial tissue identified.   COMMENT:   A. The findings are consistent with FIGO grade 1/2.   11/09/2020 Imaging   Cystoscopy and biopsy with fulguration of papillary lesions (Dr. Newsome), Robotic-assisted laparoscopic total hysterectomy with bilateral salpingoophorectomy, bilateral pelvic LND, mini-lap for specimen removal  Findings: On EUA, cervix difficult to distinguish, flush with apex; uterus small and mobile. Blood and some tumor noted on D&C. On intra-abdominal entry, normal upper abdominal exam including liver edge, diaphragm, stomach, small and large bowel, omentum. Bilateral ovaries atrophic appearing. Uterus 6-8cm and normal appearing. Mapping unsuccessful to bilateral pelvic basins. Significant retroperitoneal fibrosis noted and significant adhesions between the bladder and cervix. Several mildly prominent pelvic lymph nodes, no frank tumor involvement. No intra-abdominal or pelvic disease noted at the end of surgery.   11/09/2020 Pathology Results   A. BLADDER TUMOR, BIOPSY:  - Low grade papillary urothelial carcinoma.  - No distinct lamina propria invasion identified.  - Muscularis propria is present and not involved.   B. CERVIX, RIGHT POSTERIOR, BIOPSY:  - Fibromuscular tissue devoid of epithelium, negative for carcinoma.   C. CERVIX, LEFT POSTERIOR, BIOPSY:  - Fibromuscular tissue with endocervical glandular type epithelium,  negative for carcinoma.   D. LYMPH NODES, RIGHT PELVIC, EXCISION:  - Five lymph nodes, negative for carcinoma (0/5).   E. LYMPH NODES, LEFT PELVIC, EXCISION:  - Four lymph nodes, negative for carcinoma (0/4).   F. UTERUS, CERVIX AND BILATERAL FALLOPIAN TUBES AND OVARIES, TOTAL  HYSTERECTOMY AND BILATERAL SALPINGO-OOPHORECTOMY:  - Endometrioid carcinoma, FIGO grade 2,  with invasion less than half the  myometrium.  - No involvement of uterine serosa, cervical stroma   or adnexa.  - See oncology table.   ONCOLOGY TABLE:   UTERUS, CARCINOMA OR CARCINOSARCOMA: Resection   Procedure: Total hysterectomy and bilateral salpingo-oophorectomy,  Excision of pelvic lymph nodes  Histologic Type: Endometrioid carcinoma  Histologic Grade: FIGO grade 2  Myometrial Invasion:       Depth of Myometrial Invasion: 2 mm       Myometrial Thickness: 7 mm       Percentage of Myometrial Invasion: < 50%  Uterine Serosa Involvement: Not identified  Cervical Stroma Involvement: Not identified  Other Tissue/Organ Involvement: Not identified  Peritoneal/Ascitic Fluid: Not submitted/unknown  Lymphovascular Invasion: Not identified  Regional Lymph Nodes:       Pelvic Lymph Nodes Examined:            0 Sentinel            9 Non-sentinel            9 Total       Pelvic Lymph Nodes with Metastasis: 0        Para-aortic Lymph Nodes Examined:            0 Sentinel            0 Non-sentinel            0 Total  Distant Metastasis:       Distant Site(s) Involved: Not applicable  Pathologic Stage Classification (pTNM, AJCC 8th Edition): pT1a, pN0  Ancillary Studies: MMR/MSI testing will be ordered  Representative Tumor Block: F4  (v4.2.0.1)      Interval History: Patient is doing well since her phone call last week.  She notes that pain is improving daily.  She denies any significant vaginal bleeding or discharge.  She reports a good appetite without nausea or emesis.  Bowel function is improving.  She denies any urinary symptoms.  She has not taken oxycodone in several days and is using Tylenol and ibuprofen less frequently now.  She saw Dr. Milford Cage for follow-up and it sounds like she does not need any additional treatment from a bladder standpoint.  Past Medical/Surgical History: Past Medical History:  Diagnosis Date  . Allergic rhinitis   . Borderline glaucoma, left     no eye drops  . BPPV (benign paroxysmal positional vertigo)   . Chondromalacia of patella    left  . Coronary artery disease    cardiac cath 11-03-2017 done for positive ETT,  showed very miminal nonobstrutive cad, ef 55-65%, non caridac chest pain  . Diverticulosis of colon   . Endometrial cancer ([place])   . Frequency of urination   . GERD (gastroesophageal reflux disease)   . History of chest pain 10/14/2017   ED visit w/ admission,  hypertensive urgency;  follow up with dr Doylene Canard--- had outpatient ETT 10-21-2017 positive ischemia, echo 10-16-2017 G1DD, 60-65%;    s/p cardiac cath 11-03-2017 w/ very miminal nonobstructive cad , chest pain non cardiac  . History of diverticulitis   . History of kidney stones   . HSV-1 infection    fever blisters  . Hx of adenomatous colonic polyps 02/08/2018  . Hypertension    followed by pcp  . MDD (major depressive disorder)   . OA (osteoarthritis)    left knee, shoulder  . Osteoporosis   . Polycythemia    long hx followed by pcp and  sees hematology/ oncology--- dr m. Julien Nordmann on as needed basis, chronic and told to donate blood regularly  . Stenosis of cervix   .  Uterine mass   . Wears glasses     Past Surgical History:  Procedure Laterality Date  . CATARACT EXTRACTION W/ INTRAOCULAR LENS  IMPLANT, BILATERAL  2013  . COLONOSCOPY  02-02-2018 dr gessner  . CYSTOSCOPY  10/17/2020   Procedure: CYSTOSCOPY;  Surgeon: Amundson C Silva, Brook E, MD;  Location: [facility];  Service: Gynecology;;  . CYSTOSCOPY N/A 11/09/2020   Procedure: CYSTOSCOPY;  Surgeon: Newsome, George B, MD;  Location: [facility];  Service: Urology;  Laterality: N/A;  Dr. Newsome needs to go first. [name] and rigid bx forceps.  . DIAGNOSTIC LAPAROSCOPY  1996  . DILATATION & CURETTAGE/HYSTEROSCOPY WITH MYOSURE N/A 10/17/2020   Procedure: DILATATION & CURETTAGE/HYSTEROSCOPY WITH  MYOSURE RESECTION OF ENDOMETRIAL MASS,  OPENING OF CERVICAL STENOSIS, DRAINAGE OF UTERINE  FLUID;  Surgeon: Amundson C Silva, Brook E, MD;  Location: [facility];  Service: Gynecology;  Laterality: N/A;  . HEEL SPUR SURGERY Right 2015   Dr. Whitfield  . HEMORRHOID SURGERY  2004  . LEFT HEART CATH AND CORONARY ANGIOGRAPHY N/A 11/03/2017   Procedure: LEFT HEART CATH AND CORONARY ANGIOGRAPHY;  Surgeon: Varanasi, Jayadeep S, MD;  Location: [facility];  Service: Cardiovascular;  Laterality: N/A;  . LYMPH NODE DISSECTION N/A 11/09/2020   Procedure: BILATERAL LYMPH NODE DISSECTION;MINI LAPAROTOMY;  Surgeon: Tucker, Katherine R, MD;  Location: [facility];  Service: Gynecology;  Laterality: N/A;  . OPERATIVE ULTRASOUND N/A 10/17/2020   Procedure: OPERATIVE ULTRASOUND;  Surgeon: Amundson C Silva, Brook E, MD;  Location:  SURGERY CENTER;  Service: Gynecology;  Laterality: N/A;  . ROBOTIC ASSISTED TOTAL HYSTERECTOMY WITH BILATERAL SALPINGO OOPHERECTOMY Bilateral 11/09/2020   Procedure: XI ROBOTIC ASSISTED TOTAL HYSTERECTOMY WITH BILATERAL SALPINGO OOPHORECTOMY,;  Surgeon: Tucker, Katherine R, MD;  Location: [facility];  Service: Gynecology;  Laterality: Bilateral;  . SEPTOPLASTY  x2 1970's  . TONSILLECTOMY  age 6  . TRANSURETHRAL RESECTION OF BLADDER TUMOR N/A 11/09/2020   Procedure: TRANSURETHRAL RESECTION OF BLADDER TUMOR (TURBT);  Surgeon: Newsome, George B, MD;  Location: [facility];  Service: Urology;  Laterality: N/A;    Family History  Problem Relation Age of Onset  . Osteoporosis Mother   . Heart attack Mother   . Alzheimer's disease Mother   . Diabetes Father   . Hypertension Father   . Heart failure Father   . Ovarian cancer Sister   . Stroke Maternal Grandmother   . Prostate cancer Maternal Grandfather   . Colon cancer Neg Hx   . Colon polyps Neg Hx   . Esophageal cancer Neg Hx   . Rectal cancer Neg Hx   . Stomach cancer Neg Hx   . Breast cancer Neg Hx   . Endometrial cancer Neg Hx   . Pancreatic cancer Neg Hx     Social History   Socioeconomic  History  . Marital status: Married    Spouse name: Not on file  . Number of children: Not on file  . Years of education: Not on file  . Highest education level: Not on file  Occupational History  . Not on file  Tobacco Use  . Smoking status: Never Smoker  . Smokeless tobacco: Never Used  Vaping Use  . Vaping Use: Never used  Substance and Sexual Activity  . Alcohol use: Yes    Comment: occasional wine  . Drug use: Never  . Sexual activity: Not Currently    Partners: Male    Birth control/protection: Post-menopausal  Other Topics Concern  .   Not on file  Social History Narrative  . Not on file   Social Determinants of Health   Financial Resource Strain: Not on file  Food Insecurity: Not on file  Transportation Needs: Not on file  Physical Activity: Not on file  Stress: Not on file  Social Connections: Not on file    Current Medications:  Current Outpatient Medications:  .  amLODipine (NORVASC) 2.5 MG tablet, Take 2.5 mg by mouth daily., Disp: , Rfl: 1 .  buPROPion (WELLBUTRIN XL) 300 MG 24 hr tablet, Take 300 mg by mouth daily., Disp: , Rfl:  .  Calcium Carb-Cholecalciferol (CALCIUM 500+D3 PO), Take 1 tablet by mouth every evening., Disp: , Rfl:  .  fluticasone (FLONASE) 50 MCG/ACT nasal spray, Place 2 sprays into both nostrils daily., Disp: , Rfl:  .  ibuprofen (ADVIL) 800 MG tablet, TAKE 1 TABLET(800 MG) BY MOUTH EVERY 8 HOURS AFTER SURGERY AS NEEDED FOR MODERATE PAIN, Disp: 30 tablet, Rfl: 0 .  loratadine (CLARITIN) 10 MG tablet, Take 10 mg by mouth daily., Disp: , Rfl:  .  losartan (COZAAR) 100 MG tablet, Take 100 mg by mouth daily., Disp: , Rfl:  .  Multiple Vitamin (MULTIVITAMIN WITH MINERALS) TABS tablet, Take 1 tablet by mouth every evening., Disp: , Rfl:  .  omeprazole (PRILOSEC) 40 MG capsule, Take 40 mg by mouth daily., Disp: , Rfl:  .  oxyCODONE-acetaminophen (PERCOCET) 5-325 MG tablet, Take 1 tablet by mouth every 4 (four) hours as needed for severe pain.,  Disp: 15 tablet, Rfl: 0 .  senna-docusate (SENOKOT-S) 8.6-50 MG tablet, Take 2 tablets by mouth at bedtime. For AFTER surgery, do not take if having diarrhea, Disp: 30 tablet, Rfl: 0 .  sertraline (ZOLOFT) 100 MG tablet, Take 150 mg by mouth daily. , Disp: , Rfl:  .  valACYclovir (VALTREX) 1000 MG tablet, Take 2,000 mg by mouth 2 (two) times daily as needed (for fever blisters). , Disp: , Rfl:  .  zolpidem (AMBIEN) 10 MG tablet, Take 5 mg by mouth at bedtime., Disp: , Rfl:   Physical Exam: LMP 09/03/2011  Not performed given limitations of phone visit  Laboratory & Radiologic Studies: None new  Assessment & Plan: Danielle Knapp is a 64 y.o. woman with Stage 1A grade 2 endometrioid endometrial adenocarcinoma who presents for discussion of pathology results and follow-up.  Patient appears to be continuing to heal well.  Discussed pathology again and given only 1 intermediate risk factor, patient is at low risk of recurrence and does not require adjuvant therapy.  She is aware of follow-up visit and will call the office if any concerns or questions, before then.  I discussed the assessment and treatment plan with the patient. The patient was provided with an opportunity to ask questions and all were answered. The patient agreed with the plan and demonstrated an understanding of the instructions.   The patient was advised to call back or see an in-person evaluation if the symptoms worsen or if the condition fails to improve as anticipated.   22 minutes of total time was spent for this patient encounter, including preparation, face-to-face counseling with the patient and coordination of care, and documentation of the encounter.   Katherine Tucker, MD  Division of Gynecologic Oncology  Department of Obstetrics and Gynecology  [facility]   

## 2020-11-17 ENCOUNTER — Other Ambulatory Visit: Payer: Self-pay | Admitting: Gynecologic Oncology

## 2020-11-17 DIAGNOSIS — C541 Malignant neoplasm of endometrium: Secondary | ICD-10-CM

## 2020-11-24 ENCOUNTER — Encounter: Payer: Self-pay | Admitting: Gynecologic Oncology

## 2020-11-27 ENCOUNTER — Other Ambulatory Visit: Payer: Self-pay

## 2020-11-27 ENCOUNTER — Inpatient Hospital Stay (HOSPITAL_BASED_OUTPATIENT_CLINIC_OR_DEPARTMENT_OTHER): Payer: 59 | Admitting: Gynecologic Oncology

## 2020-11-27 ENCOUNTER — Inpatient Hospital Stay: Payer: 59

## 2020-11-27 ENCOUNTER — Encounter: Payer: Self-pay | Admitting: Gynecologic Oncology

## 2020-11-27 VITALS — BP 156/75 | HR 72 | Temp 97.5°F | Resp 18 | Ht 67.0 in | Wt 185.3 lb

## 2020-11-27 DIAGNOSIS — R8290 Unspecified abnormal findings in urine: Secondary | ICD-10-CM

## 2020-11-27 DIAGNOSIS — R1013 Epigastric pain: Secondary | ICD-10-CM

## 2020-11-27 DIAGNOSIS — C541 Malignant neoplasm of endometrium: Secondary | ICD-10-CM | POA: Diagnosis not present

## 2020-11-27 NOTE — Patient Instructions (Addendum)
It was great to see you today!  You are healing very well from surgery.    I will call you with your CT scan results once I get them. I will also call you with your urine results.  We will plan for visits every 6 months initially and then transition to yearly visits.  Since my schedule is not out past the summer, please call to get a follow-up visit scheduled with me in late September.  If anything changes between visits, such as you develop vaginal bleeding, pelvic pain, change to your bowel function, or any other concerning symptoms, please call the clinic to see me sooner at [phone].

## 2020-11-27 NOTE — Progress Notes (Signed)
Gynecologic Oncology Return Clinic Visit  11/27/2020  Reason for Visit: Postoperative follow-up and treatment discussion  Treatment History: Oncology History  Endometrial cancer [facility])   Initial Diagnosis   Endometrial cancer ([place])   09/27/2019 Imaging   CT A/P: 1. Large hypoenhancing mass in the region of the lower uterus/upper cervix. This appears to expand the endometrial canal the lower uterine segment although the endometrial canal in the uterine fundus is nondilated. As such, endometrial canal/cervical stricture is not considered likely. Rather, imaging features are highly concerning for neoplasm, likely of endometrial origin. 2. No findings of metastatic disease. Specifically, no evidence for lymphadenopathy in the abdomen/pelvis. No peritoneal or omental nodularity. No ascites. 3. Left colonic diverticulosis without diverticulitis   10/09/2020 Imaging   MRI pelvis: Distended endocervical canal, as described above, favoring a lower cervical stenosis/stricture.   Mild associated polypoid soft tissue/debris in the endometrial cavity and along the lower cervix, poorly evaluated on unenhanced MR. While the endometrial soft tissue favors debris, consider hysteroscopy to exclude a cervical mass resulting in Stenosis/stricture.   10/17/2020 Initial Biopsy   EMB: A. ENDOMETRIUM MASS, RESECTION:  - Endometrioid adenocarcinoma.  - See comment.   B. ENDOMETRIUM, CURETTAGE:  - Blood and scanty benign endocervical mucosa.  - No endometrial tissue identified.   COMMENT:   A. The findings are consistent with FIGO grade 1/2.   11/09/2020 Imaging   Cystoscopy and biopsy with fulguration of papillary lesions (Dr. Milford Cage), Robotic-assisted laparoscopic total hysterectomy with bilateral salpingoophorectomy, bilateral pelvic LND, mini-lap for specimen removal  Findings: On EUA, cervix difficult to distinguish, flush with apex; uterus small and mobile. Blood and some tumor noted on  D&C. On intra-abdominal entry, normal upper abdominal exam including liver edge, diaphragm, stomach, small and large bowel, omentum. Bilateral ovaries atrophic appearing. Uterus 6-8cm and normal appearing. Mapping unsuccessful to bilateral pelvic basins. Significant retroperitoneal fibrosis noted and significant adhesions between the bladder and cervix. Several mildly prominent pelvic lymph nodes, no frank tumor involvement. No intra-abdominal or pelvic disease noted at the end of surgery.   11/09/2020 Pathology Results   A. BLADDER TUMOR, BIOPSY:  - Low grade papillary urothelial carcinoma.  - No distinct lamina propria invasion identified.  - Muscularis propria is present and not involved.   B. CERVIX, RIGHT POSTERIOR, BIOPSY:  - Fibromuscular tissue devoid of epithelium, negative for carcinoma.   C. CERVIX, LEFT POSTERIOR, BIOPSY:  - Fibromuscular tissue with endocervical glandular type epithelium,  negative for carcinoma.   D. LYMPH NODES, RIGHT PELVIC, EXCISION:  - Five lymph nodes, negative for carcinoma (0/5).   E. LYMPH NODES, LEFT PELVIC, EXCISION:  - Four lymph nodes, negative for carcinoma (0/4).   F. UTERUS, CERVIX AND BILATERAL FALLOPIAN TUBES AND OVARIES, TOTAL  HYSTERECTOMY AND BILATERAL SALPINGO-OOPHORECTOMY:  - Endometrioid carcinoma, FIGO grade 2, with invasion less than half the  myometrium.  - No involvement of uterine serosa, cervical stroma or adnexa.  - See oncology table.   ONCOLOGY TABLE:   UTERUS, CARCINOMA OR CARCINOSARCOMA: Resection   Procedure: Total hysterectomy and bilateral salpingo-oophorectomy,  Excision of pelvic lymph nodes  Histologic Type: Endometrioid carcinoma  Histologic Grade: FIGO grade 2  Myometrial Invasion:       Depth of Myometrial Invasion: 2 mm       Myometrial Thickness: 7 mm       Percentage of Myometrial Invasion: < 50%  Uterine Serosa Involvement: Not identified  Cervical Stroma Involvement: Not identified  Other  Tissue/Organ Involvement: Not identified  Peritoneal/Ascitic Fluid:  Not submitted/unknown  Lymphovascular Invasion: Not identified  Regional Lymph Nodes:       Pelvic Lymph Nodes Examined:            0 Sentinel            9 Non-sentinel            9 Total       Pelvic Lymph Nodes with Metastasis: 0        Para-aortic Lymph Nodes Examined:            0 Sentinel            0 Non-sentinel            0 Total  Distant Metastasis:       Distant Site(s) Involved: Not applicable  Pathologic Stage Classification (pTNM, AJCC 8th Edition): pT1a, pN0  Ancillary Studies: MMR/MSI testing will be ordered  Representative Tumor Block: F4  (v4.2.0.1)      Interval History: Patient presents today for follow-up after surgery.  She notes that things have gone okay at home but her healing has been slower than she was anticipating.  She notes some swelling of her abdomen as well as sensitivity to the touch.  She also notes some swelling of her low pelvis in the area of her mons.  She denies any vaginal bleeding or discharge.  She has had some hot flashes since surgery but denies fevers or chills.  She endorses a good appetite without nausea or vomiting.  She reports bowel function is now normal off medications.  She has had some urgency and pain if she does not empty her bladder when she needs to void.  She also has had some increased frequency but thinks this may be related to drinking more water.  She has noted some odor to her urine since surgery.  Past Medical/Surgical History: Past Medical History:  Diagnosis Date  . Allergic rhinitis   . Borderline glaucoma, left    no eye drops  . BPPV (benign paroxysmal positional vertigo)   . Chondromalacia of patella    left  . Coronary artery disease    cardiac cath 11-03-2017 done for positive ETT,  showed very miminal nonobstrutive cad, ef 55-65%, non caridac chest pain  . Diverticulosis of colon   . Endometrial cancer ([place])   . Frequency of urination    . GERD (gastroesophageal reflux disease)   . History of chest pain 10/14/2017   ED visit w/ admission,  hypertensive urgency;  follow up with dr Doylene Canard--- had outpatient ETT 10-21-2017 positive ischemia, echo 10-16-2017 G1DD, 60-65%;    s/p cardiac cath 11-03-2017 w/ very miminal nonobstructive cad , chest pain non cardiac  . History of diverticulitis   . History of kidney stones   . HSV-1 infection    fever blisters  . Hx of adenomatous colonic polyps 02/08/2018  . Hypertension    followed by pcp  . MDD (major depressive disorder)   . OA (osteoarthritis)    left knee, shoulder  . Osteoporosis   . Polycythemia    long hx followed by pcp and  sees hematology/ oncology--- dr m. Julien Nordmann on as needed basis, chronic and told to donate blood regularly  . Stenosis of cervix   . Uterine mass   . Wears glasses     Past Surgical History:  Procedure Laterality Date  . CATARACT EXTRACTION W/ INTRAOCULAR LENS  IMPLANT, BILATERAL  2013  . COLONOSCOPY  02-02-2018 dr Carlean Purl  . CYSTOSCOPY  10/17/2020   Procedure: CYSTOSCOPY;  Surgeon: Nunzio Cobbs, MD;  Location[facility];  Service: Gynecology;;  . Consuela Mimes N/A 11/09/2020   Procedure: Consuela Mimes;  Surgeon: Remi Haggard, MD;  Location: [facility];  Service: Urology;  Laterality: N/A;  Dr. Milford Cage needs to go first. [name] and rigid bx forceps.  Marland Kitchen DIAGNOSTIC LAPAROSCOPY  1996  . DILATATION & CURETTAGE/HYSTEROSCOPY WITH MYOSURE N/A 10/17/2020   Procedure: DILATATION & CURETTAGE/HYSTEROSCOPY WITH  MYOSURE RESECTION OF ENDOMETRIAL MASS,  OPENING OF CERVICAL STENOSIS, DRAINAGE OF UTERINE FLUID;  Surgeon: Nunzio Cobbs, MD;  Location: [facility];  Service: Gynecology;  Laterality: N/A;  . HEEL SPUR SURGERY Right 2015   Dr. Durward Fortes  . HEMORRHOID SURGERY  2004  . LEFT HEART CATH AND CORONARY ANGIOGRAPHY N/A 11/03/2017   Procedure: LEFT HEART CATH AND CORONARY ANGIOGRAPHY;  Surgeon: Jettie Booze, MD;  Location: [facility];  Service: Cardiovascular;  Laterality: N/A;  . LYMPH NODE DISSECTION N/A 11/09/2020   Procedure: BILATERAL LYMPH NODE DISSECTION;MINI LAPAROTOMY;  Surgeon: Lafonda Mosses, MD;  Location: [facility];  Service: Gynecology;  Laterality: N/A;  . OPERATIVE ULTRASOUND N/A 10/17/2020   Procedure: OPERATIVE ULTRASOUND;  Surgeon: Nunzio Cobbs, MD;  Location: [facility];  Service: Gynecology;  Laterality: N/A;  . ROBOTIC ASSISTED TOTAL HYSTERECTOMY WITH BILATERAL SALPINGO OOPHERECTOMY Bilateral 11/09/2020   Procedure: XI ROBOTIC ASSISTED TOTAL HYSTERECTOMY WITH BILATERAL SALPINGO OOPHORECTOMY,;  Surgeon: Lafonda Mosses, MD;  Location: [facility];  Service: Gynecology;  Laterality: Bilateral;  . SEPTOPLASTY  x2 1970's  . TONSILLECTOMY  age 67  . TRANSURETHRAL RESECTION OF BLADDER TUMOR N/A 11/09/2020   Procedure: TRANSURETHRAL RESECTION OF BLADDER TUMOR (TURBT);  Surgeon: Remi Haggard, MD;  Location: [facility];  Service: Urology;  Laterality: N/A;    Family History  Problem Relation Age of Onset  . Osteoporosis Mother   . Heart attack Mother   . Alzheimer's disease Mother   . Diabetes Father   . Hypertension Father   . Heart failure Father   . Ovarian cancer Sister   . Stroke Maternal Grandmother   . Prostate cancer Maternal Grandfather   . Colon cancer Neg Hx   . Colon polyps Neg Hx   . Esophageal cancer Neg Hx   . Rectal cancer Neg Hx   . Stomach cancer Neg Hx   . Breast cancer Neg Hx   . Endometrial cancer Neg Hx   . Pancreatic cancer Neg Hx     Social History   Socioeconomic History  . Marital status: Married    Spouse name: Not on file  . Number of children: Not on file  . Years of education: Not on file  . Highest education level: Not on file  Occupational History  . Not on file  Tobacco Use  . Smoking status: Never Smoker  . Smokeless tobacco: Never Used  Vaping Use  . Vaping Use: Never used   Substance and Sexual Activity  . Alcohol use: Yes    Comment: occasional wine  . Drug use: Never  . Sexual activity: Not Currently    Partners: Male    Birth control/protection: Post-menopausal  Other Topics Concern  . Not on file  Social History Narrative  . Not on file   Social Determinants of Health   Financial Resource Strain: Not on file  Food Insecurity: Not on file  Transportation Needs: Not on file  Physical Activity: Not on file  Stress: Not on file  Social Connections: Not on file    Current Medications:  Current Outpatient Medications:  .  amLODipine (NORVASC) 2.5 MG tablet, Take 2.5 mg by mouth daily., Disp: , Rfl: 1 .  buPROPion (WELLBUTRIN XL) 300 MG 24 hr tablet, Take 300 mg by mouth daily., Disp: , Rfl:  .  Calcium Carb-Cholecalciferol (CALCIUM 500+D3 PO), Take 1 tablet by mouth every evening., Disp: , Rfl:  .  fluticasone (FLONASE) 50 MCG/ACT nasal spray, Place 2 sprays into both nostrils daily., Disp: , Rfl:  .  ibuprofen (ADVIL) 800 MG tablet, TAKE 1 TABLET(800 MG) BY MOUTH EVERY 8 HOURS AFTER SURGERY AS NEEDED FOR MODERATE PAIN, Disp: 30 tablet, Rfl: 0 .  loratadine (CLARITIN) 10 MG tablet, Take 10 mg by mouth daily., Disp: , Rfl:  .  losartan (COZAAR) 100 MG tablet, Take 100 mg by mouth daily., Disp: , Rfl:  .  Multiple Vitamin (MULTIVITAMIN WITH MINERALS) TABS tablet, Take 1 tablet by mouth every evening., Disp: , Rfl:  .  omeprazole (PRILOSEC) 40 MG capsule, Take 40 mg by mouth daily., Disp: , Rfl:  .  oxyCODONE-acetaminophen (PERCOCET) 5-325 MG tablet, Take 1 tablet by mouth every 4 (four) hours as needed for severe pain., Disp: 15 tablet, Rfl: 0 .  senna-docusate (SENOKOT-S) 8.6-50 MG tablet, Take 2 tablets by mouth at bedtime. For AFTER surgery, do not take if having diarrhea, Disp: 30 tablet, Rfl: 0 .  sertraline (ZOLOFT) 100 MG tablet, Take 150 mg by mouth daily. , Disp: , Rfl:  .  valACYclovir (VALTREX) 1000 MG tablet, Take 2,000 mg by mouth 2 (two)  times daily as needed (for fever blisters). , Disp: , Rfl:  .  zolpidem (AMBIEN) 10 MG tablet, Take 5 mg by mouth at bedtime., Disp: , Rfl:   Review of Systems: Pertinent positives include abdominal distention, pain, urinary frequency, pelvic pain, hot flashes Denies appetite changes, fevers, chills, fatigue, unexplained weight changes. Denies hearing loss, neck lumps or masses, mouth sores, ringing in ears or voice changes. Denies cough or wheezing.  Denies shortness of breath. Denies chest pain or palpitations. Denies leg swelling. Denies blood in stools, constipation, diarrhea, nausea, vomiting, or early satiety. Denies pain with intercourse, dysuria, hematuria or incontinence. Denies vaginal bleeding or vaginal discharge.   Denies joint pain, back pain or muscle pain/cramps. Denies itching, rash, or wounds. Denies dizziness, headaches, numbness or seizures. Denies swollen lymph nodes or glands, denies easy bruising or bleeding. Denies anxiety, depression, confusion, or decreased concentration.  Physical Exam: BP (!) 156/75 (BP Location: Right Arm, Patient Position: Sitting)   Pulse 72   Temp (!) 97.5 F (36.4 C) (Tympanic)   Resp 18   Ht 5' 7" (1.702 m)   Wt 185 lb 4.8 oz (84.1 kg)   LMP 09/03/2011   SpO2 100%   BMI 29.02 kg/m  General: Alert, oriented, no acute distress. HEENT: Normocephalic, atraumatic, sclera anicteric. Chest: Unlabored breathing on room air. Abdomen: Obese, soft, incisions healing well, remaining Dermabond removed.  Patient has some tenderness at the upper aspect of her mini lap incision with some swelling noted in an approximately 4 x 3 cm area at the upper aspect of the incision, no induration or exudate.  Normoactive bowel sounds.   Extremities: Grossly normal range of motion.  Warm, well perfused.  No edema bilaterally. Skin: No rashes or lesions noted. GU: Minimal edema of the mons.  Otherwise normal appearing external genitalia without erythema,  excoriation, or lesions.  Speculum exam reveals cuff intact, sutures visible, no bleeding or discharge.  Bimanual exam reveals cuff intact no significant tenderness or fluctuance.    Laboratory & Radiologic Studies: None new  Assessment & Plan: Danielle Knapp is a 65 y.o. woman with Stage 1A grade 2 endometrioid endometrial adenocarcinoma who presents for discussion of pathology results and follow-up.  Patient is overall doing well although slowly improving from a postoperative standpoint.  She is meeting all milestones but has continued pain and some abdominal symptoms.  On exam, I suspect that she has either hematoma or seroma at the superior aspect of her incision.  She does not describe symptoms that are concerning for a hernia but she does have a mass appreciated at the upper aspect of the incision.  We discussed precautions that should result in visit to the emergency department if she developed such as intractable abdominal pain or a bulge that was not due reducible.  I will order a CT scan to rule out an incisional hernia.  Given urinary symptoms, I will also order a urine culture.  We discussed continued postoperative expectations and limitations.  We also reviewed her pathology again and that she has low risk disease.  This means that her risk of recurrence is less than 5% and that no adjuvant treatment is indicated.  We discussed signs and symptoms that would be concerning for disease recurrence and that follow-up per NCCN surveillance guidelines will be every 6 months initially.  28 minutes of total time was spent for this patient encounter, including preparation, face-to-face counseling with the patient and coordination of care, and documentation of the encounter.  Jeral Pinch, MD  Division of Gynecologic Oncology  Department of Obstetrics and Gynecology  [facility]

## 2020-11-28 ENCOUNTER — Other Ambulatory Visit: Payer: Self-pay | Admitting: Gynecologic Oncology

## 2020-11-28 DIAGNOSIS — C541 Malignant neoplasm of endometrium: Secondary | ICD-10-CM

## 2020-11-28 LAB — URINE CULTURE: Culture: NO GROWTH

## 2020-11-29 ENCOUNTER — Telehealth: Payer: Self-pay | Admitting: Oncology

## 2020-11-29 DIAGNOSIS — C541 Malignant neoplasm of endometrium: Secondary | ICD-10-CM

## 2020-11-29 NOTE — Telephone Encounter (Signed)
Debbie called back and is interested in [facility].  Scheduled appointment for 12/07/20 at 1:00.  She verbalized understanding and agreement.

## 2020-11-29 NOTE — Telephone Encounter (Signed)
Left a message regarding genetic counseling.  Requested a return call.

## 2020-11-30 ENCOUNTER — Encounter (HOSPITAL_COMMUNITY): Payer: Self-pay

## 2020-11-30 ENCOUNTER — Ambulatory Visit (HOSPITAL_COMMUNITY)
Admission: RE | Admit: 2020-11-30 | Discharge: 2020-11-30 | Disposition: A | Discharge status: Home / Self Care | Payer: 59 | Source: Ambulatory Visit | Attending: Gynecologic Oncology | Admitting: Gynecologic Oncology

## 2020-11-30 ENCOUNTER — Other Ambulatory Visit: Payer: Self-pay

## 2020-11-30 DIAGNOSIS — R1013 Epigastric pain: Secondary | ICD-10-CM | POA: Diagnosis not present

## 2020-11-30 MED ORDER — IOHEXOL 300 MG/ML  SOLN
100.0000 mL | Freq: Once | INTRAMUSCULAR | Status: AC | PRN
  Administered 2020-11-30: 100 mL via INTRAVENOUS

## 2020-12-04 ENCOUNTER — Telehealth: Payer: Self-pay

## 2020-12-04 NOTE — Telephone Encounter (Signed)
LM for Ms Claiborne to call back to discuss if her abdominal pain is decreasing.

## 2020-12-05 NOTE — Telephone Encounter (Addendum)
Ms Scadden states that the bruising is decreasing on her abdomen.  The abdomen is still swollen. The left side remains tender and sore to touch. It has not change since visit on 11-27-20. Pt afebrile. Moving bowels well. Will send information to Dr. Berline Lopes to review and call her back with recommendations. Pt verbalized understanding.

## 2020-12-07 ENCOUNTER — Other Ambulatory Visit: Payer: Self-pay | Admitting: Genetic Counselor

## 2020-12-07 ENCOUNTER — Other Ambulatory Visit: Payer: Self-pay

## 2020-12-07 ENCOUNTER — Inpatient Hospital Stay: Payer: 59

## 2020-12-07 ENCOUNTER — Inpatient Hospital Stay: Payer: 59 | Attending: Gynecologic Oncology | Admitting: Genetic Counselor

## 2020-12-07 DIAGNOSIS — Z8041 Family history of malignant neoplasm of ovary: Secondary | ICD-10-CM | POA: Diagnosis not present

## 2020-12-07 DIAGNOSIS — Z803 Family history of malignant neoplasm of breast: Secondary | ICD-10-CM

## 2020-12-07 DIAGNOSIS — C541 Malignant neoplasm of endometrium: Secondary | ICD-10-CM

## 2020-12-07 DIAGNOSIS — Z8042 Family history of malignant neoplasm of prostate: Secondary | ICD-10-CM | POA: Diagnosis not present

## 2020-12-07 LAB — GENETIC SCREENING ORDER

## 2020-12-08 ENCOUNTER — Encounter: Payer: Self-pay | Admitting: Gynecologic Oncology

## 2020-12-08 ENCOUNTER — Encounter: Payer: Self-pay | Admitting: Genetic Counselor

## 2020-12-08 ENCOUNTER — Telehealth: Payer: Self-pay | Admitting: Gynecologic Oncology

## 2020-12-08 DIAGNOSIS — Z8042 Family history of malignant neoplasm of prostate: Secondary | ICD-10-CM

## 2020-12-08 DIAGNOSIS — Z8041 Family history of malignant neoplasm of ovary: Secondary | ICD-10-CM

## 2020-12-08 DIAGNOSIS — Z803 Family history of malignant neoplasm of breast: Secondary | ICD-10-CM

## 2020-12-08 HISTORY — DX: Family history of malignant neoplasm of ovary: Z80.41

## 2020-12-08 HISTORY — DX: Family history of malignant neoplasm of prostate: Z80.42

## 2020-12-08 HISTORY — DX: Family history of malignant neoplasm of breast: Z80.3

## 2020-12-08 NOTE — Progress Notes (Signed)
REFERRING PROVIDER: Dorothyann Gibbs, NP [place],  [place] [address]  PRIMARY PROVIDER:  Harlan Stains, MD  PRIMARY REASON FOR VISIT:  1. Endometrial cancer ([place])   2. Family history of ovarian cancer   3. Family history of breast cancer   4. Family history of prostate cancer      HISTORY OF PRESENT ILLNESS:   Danielle Knapp, a 65 y.o. female, was seen for a [facility] cancer genetics consultation at the request of Joylene John, NP due to a personal and family history of cancer.  Danielle Knapp presents to clinic today to discuss the possibility of a hereditary predisposition to cancer, to discuss genetic testing, and to further clarify her future cancer risks, as well as potential cancer risks for family members.   In 2022, at the age of 51, Danielle Knapp was diagnosed with endometrioid adenocarcinoma and low grade papillary urothelial carcinoma.  Endometrial pathology showed MSI-H, loss of MLH1/PMS2 by IHC, and hypermethylation of MLH1.   CANCER HISTORY:  Oncology History  Endometrial cancer [facility])   Initial Diagnosis   Endometrial cancer ([place])   09/27/2019 Imaging   CT A/P: 1. Large hypoenhancing mass in the region of the lower uterus/upper cervix. This appears to expand the endometrial canal the lower uterine segment although the endometrial canal in the uterine fundus is nondilated. As such, endometrial canal/cervical stricture is not considered likely. Rather, imaging features are highly concerning for neoplasm, likely of endometrial origin. 2. No findings of metastatic disease. Specifically, no evidence for lymphadenopathy in the abdomen/pelvis. No peritoneal or omental nodularity. No ascites. 3. Left colonic diverticulosis without diverticulitis   10/09/2020 Imaging   MRI pelvis: Distended endocervical canal, as described above, favoring a lower cervical stenosis/stricture.   Mild associated polypoid soft tissue/debris in the endometrial cavity and along the lower  cervix, poorly evaluated on unenhanced MR. While the endometrial soft tissue favors debris, consider hysteroscopy to exclude a cervical mass resulting in Stenosis/stricture.   10/17/2020 Initial Biopsy   EMB: A. ENDOMETRIUM MASS, RESECTION:  - Endometrioid adenocarcinoma.  - See comment.   B. ENDOMETRIUM, CURETTAGE:  - Blood and scanty benign endocervical mucosa.  - No endometrial tissue identified.   COMMENT:   A. The findings are consistent with FIGO grade 1/2.   11/09/2020 Imaging   Cystoscopy and biopsy with fulguration of papillary lesions (Dr. Milford Cage), Robotic-assisted laparoscopic total hysterectomy with bilateral salpingoophorectomy, bilateral pelvic LND, mini-lap for specimen removal  Findings: On EUA, cervix difficult to distinguish, flush with apex; uterus small and mobile. Blood and some tumor noted on D&C. On intra-abdominal entry, normal upper abdominal exam including liver edge, diaphragm, stomach, small and large bowel, omentum. Bilateral ovaries atrophic appearing. Uterus 6-8cm and normal appearing. Mapping unsuccessful to bilateral pelvic basins. Significant retroperitoneal fibrosis noted and significant adhesions between the bladder and cervix. Several mildly prominent pelvic lymph nodes, no frank tumor involvement. No intra-abdominal or pelvic disease noted at the end of surgery.   11/09/2020 Pathology Results   A. BLADDER TUMOR, BIOPSY:  - Low grade papillary urothelial carcinoma.  - No distinct lamina propria invasion identified.  - Muscularis propria is present and not involved.   B. CERVIX, RIGHT POSTERIOR, BIOPSY:  - Fibromuscular tissue devoid of epithelium, negative for carcinoma.   C. CERVIX, LEFT POSTERIOR, BIOPSY:  - Fibromuscular tissue with endocervical glandular type epithelium,  negative for carcinoma.   D. LYMPH NODES, RIGHT PELVIC, EXCISION:  - Five lymph nodes, negative for carcinoma (0/5).   E.  LYMPH NODES, LEFT PELVIC, EXCISION:  - Four  lymph nodes, negative for carcinoma (0/4).   F. UTERUS, CERVIX AND BILATERAL FALLOPIAN TUBES AND OVARIES, TOTAL  HYSTERECTOMY AND BILATERAL SALPINGO-OOPHORECTOMY:  - Endometrioid carcinoma, FIGO grade 2, with invasion less than half the  myometrium.  - No involvement of uterine serosa, cervical stroma or adnexa.  - See oncology table.   ONCOLOGY TABLE:   UTERUS, CARCINOMA OR CARCINOSARCOMA: Resection   Procedure: Total hysterectomy and bilateral salpingo-oophorectomy,  Excision of pelvic lymph nodes  Histologic Type: Endometrioid carcinoma  Histologic Grade: FIGO grade 2  Myometrial Invasion:       Depth of Myometrial Invasion: 2 mm       Myometrial Thickness: 7 mm       Percentage of Myometrial Invasion: < 50%  Uterine Serosa Involvement: Not identified  Cervical Stroma Involvement: Not identified  Other Tissue/Organ Involvement: Not identified  Peritoneal/Ascitic Fluid: Not submitted/unknown  Lymphovascular Invasion: Not identified  Regional Lymph Nodes:       Pelvic Lymph Nodes Examined:            0 Sentinel            9 Non-sentinel            9 Total       Pelvic Lymph Nodes with Metastasis: 0        Para-aortic Lymph Nodes Examined:            0 Sentinel            0 Non-sentinel            0 Total  Distant Metastasis:       Distant Site(s) Involved: Not applicable  Pathologic Stage Classification (pTNM, AJCC 8th Edition): pT1a, pN0  Ancillary Studies: MMR/MSI testing will be ordered  Representative Tumor Block: F4  (v4.2.0.1)      RISK FACTORS:  Menarche was at age 1.  Nulliparous.  OCP use for approximately 20 years.  Ovaries intact: no.  Hysterectomy: yes.  Menopausal status: postmenopausal.  HRT use: 0 years. Colonoscopy: yes; most recent in 01/2018; follow up in 5 year interval due to polyps per patient. Mammogram within the last year: yes; scheduled for May per patient Number of breast biopsies: 0.  Past Medical History:  Diagnosis Date  .  Allergic rhinitis   . Borderline glaucoma, left    no eye drops  . BPPV (benign paroxysmal positional vertigo)   . Chondromalacia of patella    left  . Coronary artery disease    cardiac cath 11-03-2017 done for positive ETT,  showed very miminal nonobstrutive cad, ef 55-65%, non caridac chest pain  . Diverticulosis of colon   . Endometrial cancer ([place])   . Family history of breast cancer 12/08/2020  . Family history of ovarian cancer 12/08/2020  . Family history of prostate cancer 12/08/2020  . Frequency of urination   . GERD (gastroesophageal reflux disease)   . History of chest pain 10/14/2017   ED visit w/ admission,  hypertensive urgency;  follow up with dr Doylene Canard--- had outpatient ETT 10-21-2017 positive ischemia, echo 10-16-2017 G1DD, 60-65%;    s/p cardiac cath 11-03-2017 w/ very miminal nonobstructive cad , chest pain non cardiac  . History of diverticulitis   . History of kidney stones   . HSV-1 infection    fever blisters  . Hx of adenomatous colonic polyps 02/08/2018  . Hypertension    followed by pcp  . MDD (major depressive disorder)   .  OA (osteoarthritis)    left knee, shoulder  . Osteoporosis   . Polycythemia    long hx followed by pcp and  sees hematology/ oncology--- dr m. Julien Nordmann on as needed basis, chronic and told to donate blood regularly  . Stenosis of cervix   . Uterine mass   . Wears glasses     Past Surgical History:  Procedure Laterality Date  . CATARACT EXTRACTION W/ INTRAOCULAR LENS  IMPLANT, BILATERAL  2013  . COLONOSCOPY  02-02-2018 dr Carlean Purl  . CYSTOSCOPY  10/17/2020   Procedure: CYSTOSCOPY;  Surgeon: Nunzio Cobbs, MD;  Location: [facility];  Service: Gynecology;;  . Consuela Mimes N/A 11/09/2020   Procedure: Consuela Mimes;  Surgeon: Remi Haggard, MD;  Location: [facility];  Service: Urology;  Laterality: N/A;  Dr. Milford Cage needs to go first. [name] and rigid bx forceps.  Marland Kitchen DIAGNOSTIC LAPAROSCOPY  1996  . DILATATION &  CURETTAGE/HYSTEROSCOPY WITH MYOSURE N/A 10/17/2020   Procedure: DILATATION & CURETTAGE/HYSTEROSCOPY WITH  MYOSURE RESECTION OF ENDOMETRIAL MASS,  OPENING OF CERVICAL STENOSIS, DRAINAGE OF UTERINE FLUID;  Surgeon: Nunzio Cobbs, MD;  Location: [facility];  Service: Gynecology;  Laterality: N/A;  . HEEL SPUR SURGERY Right 2015   Dr. Durward Fortes  . HEMORRHOID SURGERY  2004  . LEFT HEART CATH AND CORONARY ANGIOGRAPHY N/A 11/03/2017   Procedure: LEFT HEART CATH AND CORONARY ANGIOGRAPHY;  Surgeon: Jettie Booze, MD;  Location: [facility];  Service: Cardiovascular;  Laterality: N/A;  . LYMPH NODE DISSECTION N/A 11/09/2020   Procedure: BILATERAL LYMPH NODE DISSECTION;MINI LAPAROTOMY;  Surgeon: Lafonda Mosses, MD;  Location: [facility];  Service: Gynecology;  Laterality: N/A;  . OPERATIVE ULTRASOUND N/A 10/17/2020   Procedure: OPERATIVE ULTRASOUND;  Surgeon: Nunzio Cobbs, MD;  Location: [facility];  Service: Gynecology;  Laterality: N/A;  . ROBOTIC ASSISTED TOTAL HYSTERECTOMY WITH BILATERAL SALPINGO OOPHERECTOMY Bilateral 11/09/2020   Procedure: XI ROBOTIC ASSISTED TOTAL HYSTERECTOMY WITH BILATERAL SALPINGO OOPHORECTOMY,;  Surgeon: Lafonda Mosses, MD;  Location: [facility];  Service: Gynecology;  Laterality: Bilateral;  . SEPTOPLASTY  x2 1970's  . TONSILLECTOMY  age 6  . TRANSURETHRAL RESECTION OF BLADDER TUMOR N/A 11/09/2020   Procedure: TRANSURETHRAL RESECTION OF BLADDER TUMOR (TURBT);  Surgeon: Remi Haggard, MD;  Location: [facility];  Service: Urology;  Laterality: N/A;    Social History   Socioeconomic History  . Marital status: Married    Spouse name: Not on file  . Number of children: Not on file  . Years of education: Not on file  . Highest education level: Not on file  Occupational History  . Not on file  Tobacco Use  . Smoking status: Never Smoker  . Smokeless tobacco: Never Used  Vaping Use  . Vaping Use: Never used   Substance and Sexual Activity  . Alcohol use: Yes    Comment: occasional wine  . Drug use: Never  . Sexual activity: Not Currently    Partners: Male    Birth control/protection: Post-menopausal  Other Topics Concern  . Not on file  Social History Narrative  . Not on file   Social Determinants of Health   Financial Resource Strain: Not on file  Food Insecurity: Not on file  Transportation Needs: Not on file  Physical Activity: Not on file  Stress: Not on file  Social Connections: Not on file     FAMILY HISTORY:  We obtained a detailed, 4-generation  family history.  Significant diagnoses are listed below: Family History  Problem Relation Age of Onset  . Ovarian cancer Sister 61  . Prostate cancer Maternal Grandfather 74       metastatic  . Breast cancer Other        MGM's sister; dx after 54    Ms. Fifita has one sister, age 65, who had a history of ovarian cancer around age 30.  She does not know if her sister had hereditary caner genetic testing.  Ms. Dangerfield mother was an only child and died at age 29 without cancer.  Ms. Westergren maternal grandfather was diagnosed with metastatic prostate cancer in his 56s and died in his 72s.  Ms. Hebb's maternal grandmother's sister had a history of breast cancer after the age of 76.  Ms. Lonon father was an only child and died at age 65 without cancer.   Ms. Mcfayden is unaware of previous family history of genetic testing for hereditary cancer risks. Patient's maternal ancestors are of English/Irish descent, and paternal ancestors are of unknown descent. There is no reported Ashkenazi Jewish ancestry. There is no known consanguinity.  GENETIC COUNSELING ASSESSMENT: Danielle Knapp is a 65 y.o. female with a personal and family history of cancer which is somewhat suggestive of a hereditary cancer syndrome and predisposition to cancer given the presence of a personal history of related cancers as well as the presence of related cancers in  her maternal family. We, therefore, discussed and recommended the following at today's visit.   DISCUSSION: We discussed that 5 - 10% of cancer is hereditary, with most cases of hereditary endometrial cancer associated with mutations in the Lynch syndrome genes.  We discussed the results of her tumor screening for Lynch syndrome. There are other genes that can be associated with hereditary breast and ovarian cancer syndromes.  Type of cancer risk and level of risk are gene-specific.  We discussed that testing is beneficial for several reasons including knowing how to follow individuals after completing their treatment and understanding if other family members could be at risk for cancer and allowing them to undergo genetic testing.   We reviewed the characteristics, features and inheritance patterns of hereditary cancer syndromes. We also discussed genetic testing, including the appropriate family members to test, the process of testing, insurance coverage and turn-around-time for results. We discussed the implications of a negative, positive, carrier and/or variant of uncertain significant result. We recommended Danielle Knapp pursue genetic testing for a panel that includes genes associated with endometrial, bladder, ovarian, and breast cancer.   Danielle Knapp  was offered a common hereditary cancer panel (47 genes) and an expanded pan-cancer panel (84 genes). Danielle Knapp was informed of the benefits and limitations of each panel, including that expanded pan-cancer panels contain genes that do not have clear management guidelines at this point in time.  We also discussed that as the number of genes included on a panel increases, the chances of variants of uncertain significance increases.  After considering the benefits and limitations of each gene panel, Danielle Knapp  elected to have a common hereditary cancers panle through [facility].   The Common Hereditary Cancers + RNA Panel offered by [facility] includes  sequencing, deletion/duplication, and RNA testing of the following 47 genes: APC, ATM, AXIN2, BARD1, BMPR1A, BRCA1, BRCA2, BRIP1, CDH1, CDK4*, CDKN2A (p14ARF)*, CDKN2A (p16INK4a)*, CHEK2, CTNNA1, DICER1, EPCAM (Deletion/duplication testing only), GREM1 (promoter region deletion/duplication testing only), KIT, MEN1, MLH1, MSH2, MSH3, MSH6, MUTYH, NBN, NF1, NHTL1, PALB2, PDGFRA*, PMS2,  POLD1, POLE, PTEN, RAD50, RAD51C, RAD51D, SDHB, SDHC, SDHD, SMAD4, SMARCA4. STK11, TP53, TSC1, TSC2, and VHL.  The following genes were evaluated for sequence changes only: SDHA and HOXB13 c.251G>A variant only.  RNA analysis is not performed for the * genes.    Based on Danielle Knapp's personal history of both endometrial and bladder cancer as well as family history of ovarian, breast, and metastatic prostate cancer, she meets medical criteria for genetic testing. Despite that she meets criteria, she may still have an out of pocket cost. We discussed that if she has an out of pocket cost for testing, the lab should contact her to discuss self-pay prices and/or patient pay assistance programs.   PLAN: After considering the risks, benefits, and limitations, Danielle Knapp provided informed consent to pursue genetic testing and the blood sample was sent to [facility] for analysis of the Common Hereditary Cancers +RNA Panel. Results should be available within approximately 3 weeks' time, at which point they will be disclosed by telephone to Danielle Knapp, as will any additional recommendations warranted by these results. Danielle Knapp will receive a summary of her genetic counseling visit and a copy of her results once available. This information will also be available in Epic.   Lastly, we encouraged Danielle Knapp to remain in contact with cancer genetics annually so that we can continuously update the family history and inform her of any changes in cancer genetics and testing that may be of benefit for this family.   Danielle Knapp  questions were answered to her satisfaction today. Our contact information was provided should additional questions or concerns arise. Thank you for the referral and allowing [place] to share in the care of your patient.   Hilding Quintanar M. Joette Catching, [place], [facility] Genetic Counselor [email or web address] (P) [phone]  The patient was seen for a total of 30 minutes in face-to-face genetic counseling.  Drs. Magrinat, Lindi Adie and/or Burr Medico were available to discuss this case as needed.    _______________________________________________________________________ For Office Staff:  Number of people involved in session: 1 Was an Intern/ student involved with case: no

## 2020-12-08 NOTE — Telephone Encounter (Signed)
Called patient to check in regarding symptoms since her last visit with me. Message left.  Jeral Pinch MD Gynecologic Oncology

## 2020-12-20 ENCOUNTER — Encounter: Payer: Self-pay | Admitting: Gynecologic Oncology

## 2020-12-20 NOTE — Telephone Encounter (Signed)
Faxed return to work note and updated FMLA papers for return to work on 12-25-20 to [employer] at [phone] as requested by Ms Pilgrim.

## 2020-12-21 ENCOUNTER — Encounter: Payer: Self-pay | Admitting: Gynecologic Oncology

## 2020-12-26 ENCOUNTER — Telehealth: Payer: Self-pay | Admitting: Genetic Counselor

## 2020-12-26 ENCOUNTER — Encounter: Payer: Self-pay | Admitting: Genetic Counselor

## 2020-12-26 DIAGNOSIS — Z1379 Encounter for other screening for genetic and chromosomal anomalies: Secondary | ICD-10-CM | POA: Insufficient documentation

## 2020-12-26 NOTE — Telephone Encounter (Signed)
Contacted patient in attempt to disclose results of genetic testing.  LVM with contact information requesting a call back.  

## 2021-01-02 ENCOUNTER — Ambulatory Visit: Payer: Self-pay | Admitting: Genetic Counselor

## 2021-01-02 DIAGNOSIS — C541 Malignant neoplasm of endometrium: Secondary | ICD-10-CM

## 2021-01-02 DIAGNOSIS — Z1379 Encounter for other screening for genetic and chromosomal anomalies: Secondary | ICD-10-CM

## 2021-01-02 DIAGNOSIS — Z803 Family history of malignant neoplasm of breast: Secondary | ICD-10-CM

## 2021-01-02 DIAGNOSIS — Z8041 Family history of malignant neoplasm of ovary: Secondary | ICD-10-CM

## 2021-01-02 DIAGNOSIS — Z8042 Family history of malignant neoplasm of prostate: Secondary | ICD-10-CM

## 2021-01-02 NOTE — Progress Notes (Signed)
HPI:  Ms. Eiland was previously seen in the [place] clinic due to a personal and family history of cancer and concerns regarding a hereditary predisposition to cancer. Please refer to our prior cancer genetics clinic note for more information regarding our discussion, assessment and recommendations, at the time. Ms. Hooley recent genetic test results were disclosed to her, as were recommendations warranted by these results. These results and recommendations are discussed in more detail below.  CANCER HISTORY:  In 2022, at the age of 75, Ms. Blethen was diagnosed with endometrioid adenocarcinoma and low grade papillary urothelial carcinoma.  Endometrial pathology showed MSI-H, loss of MLH1/PMS2 by IHC, and hypermethylation of MLH1.   Oncology History  Endometrial cancer [facility])   Initial Diagnosis   Endometrial cancer ([place])   09/27/2019 Imaging   CT A/P: 1. Large hypoenhancing mass in the region of the lower uterus/upper cervix. This appears to expand the endometrial canal the lower uterine segment although the endometrial canal in the uterine fundus is nondilated. As such, endometrial canal/cervical stricture is not considered likely. Rather, imaging features are highly concerning for neoplasm, likely of endometrial origin. 2. No findings of metastatic disease. Specifically, no evidence for lymphadenopathy in the abdomen/pelvis. No peritoneal or omental nodularity. No ascites. 3. Left colonic diverticulosis without diverticulitis   10/09/2020 Imaging   MRI pelvis: Distended endocervical canal, as described above, favoring a lower cervical stenosis/stricture.   Mild associated polypoid soft tissue/debris in the endometrial cavity and along the lower cervix, poorly evaluated on unenhanced MR. While the endometrial soft tissue favors debris, consider hysteroscopy to exclude a cervical mass resulting in Stenosis/stricture.   10/17/2020 Initial Biopsy   EMB: A.  ENDOMETRIUM MASS, RESECTION:  - Endometrioid adenocarcinoma.  - See comment.   B. ENDOMETRIUM, CURETTAGE:  - Blood and scanty benign endocervical mucosa.  - No endometrial tissue identified.   COMMENT:   A. The findings are consistent with FIGO grade 1/2.   11/09/2020 Imaging   Cystoscopy and biopsy with fulguration of papillary lesions (Dr. Milford Cage), Robotic-assisted laparoscopic total hysterectomy with bilateral salpingoophorectomy, bilateral pelvic LND, mini-lap for specimen removal  Findings: On EUA, cervix difficult to distinguish, flush with apex; uterus small and mobile. Blood and some tumor noted on D&C. On intra-abdominal entry, normal upper abdominal exam including liver edge, diaphragm, stomach, small and large bowel, omentum. Bilateral ovaries atrophic appearing. Uterus 6-8cm and normal appearing. Mapping unsuccessful to bilateral pelvic basins. Significant retroperitoneal fibrosis noted and significant adhesions between the bladder and cervix. Several mildly prominent pelvic lymph nodes, no frank tumor involvement. No intra-abdominal or pelvic disease noted at the end of surgery.   11/09/2020 Pathology Results   A. BLADDER TUMOR, BIOPSY:  - Low grade papillary urothelial carcinoma.  - No distinct lamina propria invasion identified.  - Muscularis propria is present and not involved.   B. CERVIX, RIGHT POSTERIOR, BIOPSY:  - Fibromuscular tissue devoid of epithelium, negative for carcinoma.   C. CERVIX, LEFT POSTERIOR, BIOPSY:  - Fibromuscular tissue with endocervical glandular type epithelium,  negative for carcinoma.   D. LYMPH NODES, RIGHT PELVIC, EXCISION:  - Five lymph nodes, negative for carcinoma (0/5).   E. LYMPH NODES, LEFT PELVIC, EXCISION:  - Four lymph nodes, negative for carcinoma (0/4).   F. UTERUS, CERVIX AND BILATERAL FALLOPIAN TUBES AND OVARIES, TOTAL  HYSTERECTOMY AND BILATERAL SALPINGO-OOPHORECTOMY:  - Endometrioid carcinoma, FIGO grade 2, with  invasion less than half the  myometrium.  - No involvement of uterine serosa, cervical stroma or  adnexa.  - See oncology table.   ONCOLOGY TABLE:   UTERUS, CARCINOMA OR CARCINOSARCOMA: Resection   Procedure: Total hysterectomy and bilateral salpingo-oophorectomy,  Excision of pelvic lymph nodes  Histologic Type: Endometrioid carcinoma  Histologic Grade: FIGO grade 2  Myometrial Invasion:       Depth of Myometrial Invasion: 2 mm       Myometrial Thickness: 7 mm       Percentage of Myometrial Invasion: < 50%  Uterine Serosa Involvement: Not identified  Cervical Stroma Involvement: Not identified  Other Tissue/Organ Involvement: Not identified  Peritoneal/Ascitic Fluid: Not submitted/unknown  Lymphovascular Invasion: Not identified  Regional Lymph Nodes:       Pelvic Lymph Nodes Examined:            0 Sentinel            9 Non-sentinel            9 Total       Pelvic Lymph Nodes with Metastasis: 0        Para-aortic Lymph Nodes Examined:            0 Sentinel            0 Non-sentinel            0 Total  Distant Metastasis:       Distant Site(s) Involved: Not applicable  Pathologic Stage Classification (pTNM, AJCC 8th Edition): pT1a, pN0  Ancillary Studies: MMR/MSI testing will be ordered  Representative Tumor Block: F4  (v4.2.0.1)    12/24/2020 Genetic Testing   Negative hereditary cancer genetic testing: no pathogenic variants detected in [facility] Common Hereditary Cancers +RNA Panel.  The report date is December 24, 2020.  The Common Hereditary Cancers + RNA Panel offered by [facility] includes sequencing, deletion/duplication, and RNA testing of the following 47 genes: APC, ATM, AXIN2, BARD1, BMPR1A, BRCA1, BRCA2, BRIP1, CDH1, CDK4*, CDKN2A (p14ARF)*, CDKN2A (p16INK4a)*, CHEK2, CTNNA1, DICER1, EPCAM (Deletion/duplication testing only), GREM1 (promoter region deletion/duplication testing only), KIT, MEN1, MLH1, MSH2, MSH3, MSH6, MUTYH, NBN, NF1, NHTL1, PALB2, PDGFRA*, PMS2, POLD1,  POLE, PTEN, RAD50, RAD51C, RAD51D, SDHB, SDHC, SDHD, SMAD4, SMARCA4. STK11, TP53, TSC1, TSC2, and VHL.  The following genes were evaluated for sequence changes only: SDHA and HOXB13 c.251G>A variant only.  RNA analysis is not performed for the * genes.       FAMILY HISTORY:  We obtained a detailed, 4-generation family history.  Significant diagnoses are listed below: Family History  Problem Relation Age of Onset  . Ovarian cancer Sister 26  . Prostate cancer Maternal Grandfather 74       metastatic  . Breast cancer Other        MGM's sister; dx after 74     Ms. Leighton has one sister, age 40, who had a history of ovarian cancer around age 72.  She does not know if her sister had hereditary caner genetic testing.  Ms. Ellinger mother was an only child and died at age 74 without cancer.  Ms. Canul maternal grandfather was diagnosed with metastatic prostate cancer in his 28s and died in his 100s.  Ms. Furrow's maternal grandmother's sister had a history of breast cancer after the age of 66.  Ms. Mcghie father was an only child and died at age 73 without cancer.   Ms. Bosak is unaware of previous family history of genetic testing for hereditary cancer risks. Patient's maternal ancestors are of English/Irish descent, and paternal ancestors are of unknown descent. There is no  reported Ashkenazi Jewish ancestry. There is no known consanguinity.  GENETIC TEST RESULTS: Genetic testing reported out on December 24, 2020.  The [facility] Common Hereditary Cancers +RNA Panel found no pathogenic mutations.  The Common Hereditary Cancers + RNA Panel offered by [facility] includes sequencing, deletion/duplication, and RNA testing of the following 47 genes: APC, ATM, AXIN2, BARD1, BMPR1A, BRCA1, BRCA2, BRIP1, CDH1, CDK4*, CDKN2A (p14ARF)*, CDKN2A (p16INK4a)*, CHEK2, CTNNA1, DICER1, EPCAM (Deletion/duplication testing only), GREM1 (promoter region deletion/duplication testing only), KIT, MEN1, MLH1, MSH2, MSH3, MSH6,  MUTYH, NBN, NF1, NHTL1, PALB2, PDGFRA*, PMS2, POLD1, POLE, PTEN, RAD50, RAD51C, RAD51D, SDHB, SDHC, SDHD, SMAD4, SMARCA4. STK11, TP53, TSC1, TSC2, and VHL.  The following genes were evaluated for sequence changes only: SDHA and HOXB13 c.251G>A variant only.  RNA analysis is not performed for the * genes.    The test report has been scanned into EPIC and is located under the Molecular Pathology section of the Results Review tab.  A portion of the result report is included below for reference.     We discussed with Ms. Bangert that because current genetic testing is not perfect, it is possible there may be a gene mutation in one of these genes that current testing cannot detect, but that chance is small.  We also discussed, that there could be another gene that has not yet been discovered, or that we have not yet tested, that is responsible for the cancer diagnoses in the family. It is also possible there is a hereditary cause for the cancer in the family that Ms. Luffman did not inherit and therefore was not identified in her testing.  Therefore, it is important to remain in touch with cancer genetics in the future so that we can continue to offer Ms. Chuang the most up to date genetic testing.   ADDITIONAL GENETIC TESTING: We discussed with Ms. Ruedas that there are other genes that are associated with increased cancer risk that can be analyzed. Should Ms. Truex wish to pursue additional genetic testing, we are happy to discuss and coordinate this testing, at any time.     CANCER SCREENING RECOMMENDATIONS: Ms. Yust test result is considered negative (normal).  This means that we have not identified a hereditary cause for her personal history of cancer at this time. Most cancers happen by chance and this negative test suggests that her cancer may fall into this category.    While reassuring, this does not definitively rule out a hereditary predisposition to cancer. It is still possible that there  could be genetic mutations that are undetectable by current technology. There could be genetic mutations in genes that have not been tested or identified to increase cancer risk.  Therefore, it is recommended she continue to follow the cancer management and screening guidelines provided by her oncology and primary healthcare provider.   An individual's cancer risk and medical management are not determined by genetic test results alone. Overall cancer risk assessment incorporates additional factors, including personal medical history, family history, and any available genetic information that may result in a personalized plan for cancer prevention and surveillance.   RECOMMENDATIONS FOR FAMILY MEMBERS:  Individuals in this family might be at some increased risk of developing cancer, over the general population risk, simply due to the family history of cancer.  We recommended women in this family have a yearly mammogram beginning at age 41, or 72 years younger than the earliest onset of cancer, an annual clinical breast exam, and perform monthly breast self-exams. Women in  this family should also have a gynecological exam as recommended by their primary provider. All family members should be referred for colonoscopy starting at age 24.   It is also possible there is a hereditary cause for the cancer in Ms. Kuhlman's family that she did not inherit and therefore was not identified in her.  Based on Ms. Wittwer's family history, we recommended her sister, who was diagnosed with ovarian cancer at age 41, have genetic counseling and testing. Ms. Hosking can let us know if we can be of any assistance in coordinating genetic counseling and/or testing for this family member.    FOLLOW-UP: Lastly, we discussed with Ms. Common that cancer genetics is a rapidly advancing field and it is possible that new genetic tests will be appropriate for her and/or her family members in the future. We encouraged her to remain in  contact with cancer genetics on an annual basis so we can update her personal and family histories and let her know of advances in cancer genetics that may benefit this family.   Our contact number was provided. Ms. Zubiate questions were answered to her satisfaction, and she knows she is welcome to call us at anytime with additional questions or concerns.     Tyana Butzer M. Joette Catching, [place], [facility] Genetic Counselor [email or web address] (P) [phone]

## 2021-01-02 NOTE — Telephone Encounter (Signed)
Revealed negative genetic testing.  Discussed that we do not know why she has a history of endometrial and bladder cancer or why there is cancer in the family. It could be familial, due to a different gene that we are not testing, or maybe our current technology may not be able to pick something up.  It will be important for her to keep in contact with genetics to keep up with whether additional testing may be needed.

## 2021-01-23 DIAGNOSIS — C50919 Malignant neoplasm of unspecified site of unspecified female breast: Secondary | ICD-10-CM

## 2021-01-23 HISTORY — DX: Malignant neoplasm of unspecified site of unspecified female breast: C50.919

## 2021-01-24 ENCOUNTER — Telehealth: Payer: Self-pay | Admitting: *Deleted

## 2021-01-24 ENCOUNTER — Encounter: Payer: Self-pay | Admitting: *Deleted

## 2021-01-24 NOTE — Telephone Encounter (Signed)
Confirmed [facility] for 01/31/21 at 12:15pm .  Instructions and contact information given.

## 2021-01-30 ENCOUNTER — Encounter: Payer: Self-pay | Admitting: *Deleted

## 2021-01-30 ENCOUNTER — Encounter: Payer: Self-pay | Admitting: Family Medicine

## 2021-01-30 DIAGNOSIS — D0511 Intraductal carcinoma in situ of right breast: Secondary | ICD-10-CM

## 2021-01-31 ENCOUNTER — Encounter: Payer: Self-pay | Admitting: Oncology

## 2021-01-31 ENCOUNTER — Ambulatory Visit
Admission: RE | Admit: 2021-01-31 | Discharge: 2021-01-31 | Disposition: A | Discharge status: Home / Self Care | Payer: 59 | Source: Ambulatory Visit | Attending: Radiation Oncology | Admitting: Radiation Oncology

## 2021-01-31 ENCOUNTER — Inpatient Hospital Stay: Payer: 59

## 2021-01-31 ENCOUNTER — Other Ambulatory Visit: Payer: Self-pay

## 2021-01-31 ENCOUNTER — Ambulatory Visit: Payer: 59 | Admitting: Physical Therapy

## 2021-01-31 ENCOUNTER — Inpatient Hospital Stay: Payer: 59 | Attending: Gynecologic Oncology | Admitting: Oncology

## 2021-01-31 ENCOUNTER — Other Ambulatory Visit: Payer: Self-pay | Admitting: Surgery

## 2021-01-31 ENCOUNTER — Encounter: Payer: Self-pay | Admitting: *Deleted

## 2021-01-31 VITALS — BP 143/77 | HR 67 | Temp 97.7°F | Resp 18 | Ht 67.0 in | Wt 186.4 lb

## 2021-01-31 DIAGNOSIS — D0511 Intraductal carcinoma in situ of right breast: Secondary | ICD-10-CM | POA: Diagnosis not present

## 2021-01-31 DIAGNOSIS — Z8542 Personal history of malignant neoplasm of other parts of uterus: Secondary | ICD-10-CM | POA: Diagnosis not present

## 2021-01-31 DIAGNOSIS — Z8551 Personal history of malignant neoplasm of bladder: Secondary | ICD-10-CM | POA: Diagnosis not present

## 2021-01-31 DIAGNOSIS — Z79899 Other long term (current) drug therapy: Secondary | ICD-10-CM | POA: Insufficient documentation

## 2021-01-31 DIAGNOSIS — Z90710 Acquired absence of both cervix and uterus: Secondary | ICD-10-CM | POA: Diagnosis not present

## 2021-01-31 DIAGNOSIS — D751 Secondary polycythemia: Secondary | ICD-10-CM | POA: Diagnosis not present

## 2021-01-31 DIAGNOSIS — Z90722 Acquired absence of ovaries, bilateral: Secondary | ICD-10-CM | POA: Insufficient documentation

## 2021-01-31 DIAGNOSIS — C541 Malignant neoplasm of endometrium: Secondary | ICD-10-CM | POA: Diagnosis not present

## 2021-01-31 DIAGNOSIS — I10 Essential (primary) hypertension: Secondary | ICD-10-CM | POA: Insufficient documentation

## 2021-01-31 DIAGNOSIS — C679 Malignant neoplasm of bladder, unspecified: Secondary | ICD-10-CM | POA: Insufficient documentation

## 2021-01-31 DIAGNOSIS — Z9079 Acquired absence of other genital organ(s): Secondary | ICD-10-CM | POA: Diagnosis not present

## 2021-01-31 LAB — CBC WITH DIFFERENTIAL (CANCER CENTER ONLY)
Abs Immature Granulocytes: 0.01 10*3/uL (ref 0.00–0.07)
Basophils Absolute: 0.1 10*3/uL (ref 0.0–0.1)
Basophils Relative: 1 %
Eosinophils Absolute: 0.1 10*3/uL (ref 0.0–0.5)
Eosinophils Relative: 2 %
HCT: 43.3 % (ref 36.0–46.0)
Hemoglobin: 16.2 g/dL — ABNORMAL HIGH (ref 12.0–15.0)
Immature Granulocytes: 0 %
Lymphocytes Relative: 19 %
Lymphs Abs: 1.3 10*3/uL (ref 0.7–4.0)
MCH: 32 pg (ref 26.0–34.0)
MCHC: 37.4 g/dL — ABNORMAL HIGH (ref 30.0–36.0)
MCV: 85.4 fL (ref 80.0–100.0)
Monocytes Absolute: 0.6 10*3/uL (ref 0.1–1.0)
Monocytes Relative: 9 %
Neutro Abs: 4.7 10*3/uL (ref 1.7–7.7)
Neutrophils Relative %: 69 %
Platelet Count: 263 10*3/uL (ref 150–400)
RBC: 5.07 MIL/uL (ref 3.87–5.11)
RDW: 12.7 % (ref 11.5–15.5)
WBC Count: 6.8 10*3/uL (ref 4.0–10.5)
nRBC: 0 % (ref 0.0–0.2)

## 2021-01-31 LAB — CMP (CANCER CENTER ONLY)
ALT: 26 U/L (ref 0–44)
AST: 22 U/L (ref 15–41)
Albumin: 4.2 g/dL (ref 3.5–5.0)
Alkaline Phosphatase: 69 U/L (ref 38–126)
Anion gap: 11 (ref 5–15)
BUN: 13 mg/dL (ref 8–23)
CO2: 26 mmol/L (ref 22–32)
Calcium: 9.6 mg/dL (ref 8.9–10.3)
Chloride: 105 mmol/L (ref 98–111)
Creatinine: 0.78 mg/dL (ref 0.44–1.00)
GFR, Estimated: >60 mL/min (ref >60)
Glucose, Bld: 100 mg/dL — ABNORMAL HIGH (ref 70–99)
Potassium: 4.3 mmol/L (ref 3.5–5.1)
Sodium: 142 mmol/L (ref 135–145)
Total Bilirubin: 0.8 mg/dL (ref 0.3–1.2)
Total Protein: 7.1 g/dL (ref 6.5–8.1)

## 2021-01-31 NOTE — Progress Notes (Signed)
Radiation Oncology         ([phone]) [phone] ________________________________  Name: Danielle Knapp        MRN: [id number]  Date of Service: 01/31/2021 DOB: July 13, 1956  CC:Harlan Stains, MD  Coralie Keens, MD     REFERRING PHYSICIAN: Coralie Keens, MD   DIAGNOSIS: The encounter diagnosis was Ductal carcinoma in situ (DCIS) of right breast.   HISTORY OF PRESENT ILLNESS: Danielle Knapp is a 65 y.o. female seen in the multidisciplinary breast clinic for a new diagnosis of right breast cancer.  The patient was also just recently diagnosed with a stage I, pT1aN0 grade 2 endometrial carcinoma.  She underwent robotic hysterectomy with bilateral salpingo-oophorectomy and lymph node dissection on 11/09/2020.  She also had a bladder tumor that was biopsied and found to have a low-grade papillary urothelial carcinoma without invasion of the muscularis or lamina propria.  She will be followed expectantly without any adjuvant treatment.  Her recent imaging of the breast for screening purposes showed calcifications and this showed amorphic right upper quadrant measuring 2-3 cm. A stereotactic biopsy revealed a low grade DCIS with calcifications and intraductal papilloma and changes of ADH were also seen. Her cancer was ER/PR positive. She's seen to discuss treatment of her cancer.   PREVIOUS RADIATION THERAPY: No   PAST MEDICAL HISTORY:  Past Medical History:  Diagnosis Date  . Allergic rhinitis   . Borderline glaucoma, left    no eye drops  . BPPV (benign paroxysmal positional vertigo)   . Chondromalacia of patella    left  . Coronary artery disease    cardiac cath 11-03-2017 done for positive ETT,  showed very miminal nonobstrutive cad, ef 55-65%, non caridac chest pain  . Diverticulosis of colon   . Endometrial cancer ([place])   . Family history of breast cancer 12/08/2020  . Family history of ovarian cancer 12/08/2020  . Family history of prostate cancer 12/08/2020  . Frequency of urination   .  GERD (gastroesophageal reflux disease)   . History of chest pain 10/14/2017   ED visit w/ admission,  hypertensive urgency;  follow up with dr Doylene Canard--- had outpatient ETT 10-21-2017 positive ischemia, echo 10-16-2017 G1DD, 60-65%;    s/p cardiac cath 11-03-2017 w/ very miminal nonobstructive cad , chest pain non cardiac  . History of diverticulitis   . History of kidney stones   . HSV-1 infection    fever blisters  . Hx of adenomatous colonic polyps 02/08/2018  . Hypertension    followed by pcp  . MDD (major depressive disorder)   . OA (osteoarthritis)    left knee, shoulder  . Osteoporosis   . Polycythemia    long hx followed by pcp and  sees hematology/ oncology--- dr m. Julien Nordmann on as needed basis, chronic and told to donate blood regularly  . Stenosis of cervix   . Uterine mass   . Wears glasses        PAST SURGICAL HISTORY: Past Surgical History:  Procedure Laterality Date  . CATARACT EXTRACTION W/ INTRAOCULAR LENS  IMPLANT, BILATERAL  2013  . COLONOSCOPY  02-02-2018 dr Carlean Purl  . CYSTOSCOPY  10/17/2020   Procedure: CYSTOSCOPY;  Surgeon: Nunzio Cobbs, MD;  Location[facility];  Service: Gynecology;;  . Consuela Mimes N/A 11/09/2020   Procedure: Consuela Mimes;  Surgeon: Remi Haggard, MD;  Location: [facility];  Service: Urology;  Laterality: N/A;  Dr. Milford Cage needs to go first. [name] and rigid bx forceps.  Marland Kitchen  DIAGNOSTIC LAPAROSCOPY  1996  . DILATATION & CURETTAGE/HYSTEROSCOPY WITH MYOSURE N/A 10/17/2020   Procedure: DILATATION & CURETTAGE/HYSTEROSCOPY WITH  MYOSURE RESECTION OF ENDOMETRIAL MASS,  OPENING OF CERVICAL STENOSIS, DRAINAGE OF UTERINE FLUID;  Surgeon: Nunzio Cobbs, MD;  Location: [facility];  Service: Gynecology;  Laterality: N/A;  . HEEL SPUR SURGERY Right 2015   Dr. Durward Fortes  . HEMORRHOID SURGERY  2004  . LEFT HEART CATH AND CORONARY ANGIOGRAPHY N/A 11/03/2017   Procedure: LEFT HEART CATH AND CORONARY ANGIOGRAPHY;   Surgeon: Jettie Booze, MD;  Location: [facility];  Service: Cardiovascular;  Laterality: N/A;  . LYMPH NODE DISSECTION N/A 11/09/2020   Procedure: BILATERAL LYMPH NODE DISSECTION;MINI LAPAROTOMY;  Surgeon: Lafonda Mosses, MD;  Location: [facility];  Service: Gynecology;  Laterality: N/A;  . OPERATIVE ULTRASOUND N/A 10/17/2020   Procedure: OPERATIVE ULTRASOUND;  Surgeon: Nunzio Cobbs, MD;  [facility];  Service: Gynecology;  Laterality: N/A;  . ROBOTIC ASSISTED TOTAL HYSTERECTOMY WITH BILATERAL SALPINGO OOPHERECTOMY Bilateral 11/09/2020   Procedure: XI ROBOTIC ASSISTED TOTAL HYSTERECTOMY WITH BILATERAL SALPINGO OOPHORECTOMY,;  Surgeon: Lafonda Mosses, MD;  Location: [facility];  Service: Gynecology;  Laterality: Bilateral;  . SEPTOPLASTY  x2 1970's  . TONSILLECTOMY  age 39  . TRANSURETHRAL RESECTION OF BLADDER TUMOR N/A 11/09/2020   Procedure: TRANSURETHRAL RESECTION OF BLADDER TUMOR (TURBT);  Surgeon: Remi Haggard, MD;  Location: [facility];  Service: Urology;  Laterality: N/A;     FAMILY HISTORY:  Family History  Problem Relation Age of Onset  . Osteoporosis Mother   . Heart attack Mother   . Alzheimer's disease Mother   . Diabetes Father   . Hypertension Father   . Heart failure Father   . Ovarian cancer Sister 21  . Stroke Maternal Grandmother   . Prostate cancer Maternal Grandfather 74       metastatic  . Breast cancer Other        MGM's sister; dx after 33  . Colon cancer Neg Hx   . Colon polyps Neg Hx   . Esophageal cancer Neg Hx   . Rectal cancer Neg Hx   . Stomach cancer Neg Hx   . Endometrial cancer Neg Hx   . Pancreatic cancer Neg Hx      SOCIAL HISTORY:  reports that she has never smoked. She has never used smokeless tobacco. She reports current alcohol use. She reports that she does not use drugs.  The patient is married and lives in [place].  She works for a [employer] in [place].     ALLERGIES: Ciprofloxacin hcl, Erythromycin, Metronidazole, Lisinopril, Ace inhibitors, and Eszopiclone   MEDICATIONS:  Current Outpatient Medications  Medication Sig Dispense Refill  . amLODipine (NORVASC) 2.5 MG tablet Take 2.5 mg by mouth daily.  1  . buPROPion (WELLBUTRIN XL) 300 MG 24 hr tablet Take 300 mg by mouth daily.    . Calcium Carb-Cholecalciferol (CALCIUM 500+D3 PO) Take 1 tablet by mouth every evening.    . fluticasone (FLONASE) 50 MCG/ACT nasal spray Place 2 sprays into both nostrils daily.    Marland Kitchen ibuprofen (ADVIL) 800 MG tablet TAKE 1 TABLET(800 MG) BY MOUTH EVERY 8 HOURS AFTER SURGERY AS NEEDED FOR MODERATE PAIN 30 tablet 0  . loratadine (CLARITIN) 10 MG tablet Take 10 mg by mouth daily.    Marland Kitchen losartan (COZAAR) 100 MG tablet Take 100 mg by mouth daily.    Marland Kitchen  Multiple Vitamin (MULTIVITAMIN WITH MINERALS) TABS tablet Take 1 tablet by mouth every evening.    Marland Kitchen omeprazole (PRILOSEC) 40 MG capsule Take 40 mg by mouth daily.    Marland Kitchen oxyCODONE-acetaminophen (PERCOCET) 5-325 MG tablet Take 1 tablet by mouth every 4 (four) hours as needed for severe pain. 15 tablet 0  . senna-docusate (SENOKOT-S) 8.6-50 MG tablet Take 2 tablets by mouth at bedtime. For AFTER surgery, do not take if having diarrhea 30 tablet 0  . sertraline (ZOLOFT) 100 MG tablet Take 150 mg by mouth daily.     . valACYclovir (VALTREX) 1000 MG tablet Take 2,000 mg by mouth 2 (two) times daily as needed (for fever blisters).     . zolpidem (AMBIEN) 10 MG tablet Take 5 mg by mouth at bedtime.     No current facility-administered medications for this encounter.     REVIEW OF SYSTEMS: On review of systems, the patient reports that she is doing well and denies specific breast complaints. She has some skin changes that she is curious about. No other concerns are verbalized.   PHYSICAL EXAM:  Wt Readings from Last 3 Encounters:  11/27/20 185 lb 4.8 oz (84.1 kg)  11/09/20 178 lb 9.6 oz (81 kg)  11/03/20 184 lb (83.5 kg)    Temp Readings from Last 3 Encounters:  11/27/20 (!) 97.5 F (36.4 C) (Tympanic)  11/09/20 97.6 F (36.4 C)  11/06/20 98.9 F (37.2 C) (Oral)   BP Readings from Last 3 Encounters:  11/27/20 (!) 156/75  11/09/20 124/72  11/06/20 130/82   Pulse Readings from Last 3 Encounters:  11/27/20 72  11/09/20 64  11/06/20 60    In general this is a well appearing Caucasian female in no acute distress. She's alert and oriented x4 and appropriate throughout the examination. Cardiopulmonary assessment is negative for acute distress and she exhibits normal effort. Bilateral breast exam is deferred. She has multiple seborrheic keratoses seen of the left shoulder   ECOG = 0  0 - Asymptomatic (Fully active, able to carry on all predisease activities without restriction)  1 - Symptomatic but completely ambulatory (Restricted in physically strenuous activity but ambulatory and able to carry out work of a light or sedentary nature. For example, light housework, office work)  2 - Symptomatic, <50% in bed during the day (Ambulatory and capable of all self care but unable to carry out any work activities. Up and about more than 50% of waking hours)  3 - Symptomatic, >50% in bed, but not bedbound (Capable of only limited self-care, confined to bed or chair 50% or more of waking hours)  4 - Bedbound (Completely disabled. Cannot carry on any self-care. Totally confined to bed or chair)  5 - Death   Eustace Pen MM, Creech RH, Tormey DC, et al. [phone]). "Toxicity and response criteria of the [facility]". [place] Oncol. 5 (6): 649-55    LABORATORY DATA:  Lab Results  Component Value Date   WBC 8.4 11/06/2020   HGB 16.3 (H) 11/06/2020   HCT 45.6 11/06/2020   MCV 87.9 11/06/2020   PLT 269 11/06/2020   Lab Results  Component Value Date   NA 141 11/06/2020   K 3.9 11/06/2020   CL 105 11/06/2020   CO2 25 11/06/2020   Lab Results  Component Value Date   ALT 29 11/06/2020    AST 24 11/06/2020   ALKPHOS 66 11/06/2020   BILITOT 1.0 11/06/2020      RADIOGRAPHY: No results  found.     IMPRESSION/PLAN: 1. Low grade, ER/PR positive DCIS of the right breast. Dr. Lisbeth Renshaw discusses the pathology findings and reviews the nature of early noninvasive breast disease. The consensus from the breast conference includes breast conservation with lumpectomy. Dr. Lisbeth Renshaw discusses the rationale for  external radiotherapy to the breast  to reduce risks of local recurrence followed by antiestrogen therapy. We discussed the risks, benefits, short, and long term effects of radiotherapy, as well as the curative intent, and the patient is interested in proceeding. Dr. Lisbeth Renshaw discusses the delivery and logistics of radiotherapy and anticipates a course of 4 weeks of radiotherapy to the right breast. We will see her back a few weeks after surgery to discuss the simulation process and anticipate we starting radiotherapy about 4-6 weeks after surgery. She's hoping to finish treatment prior to a trip out [place] this fall which we should be able to complete treatment by then. 2. Possible genetic predisposition to malignancy. The patient has a strong family history of breast and ovarian cancer.  She has already undergone genetic testing.  Her genetics were discussed this morning in conference but are already up to date. No additional testing is necessary at this time.    In a visit lasting 60 minutes, greater than 50% of the time was spent face to face reviewing her case, as well as in preparation of, discussing, and coordinating the patient's care.  The above documentation reflects my direct findings during this shared patient visit. Please see the separate note by Dr. Lisbeth Renshaw on this date for the remainder of the patient's plan of care.    Carola Rhine, [facility]    **Disclaimer: This note was dictated with voice recognition software. Similar sounding words can inadvertently be transcribed and this  note may contain transcription errors which may not have been corrected upon publication of note.**

## 2021-01-31 NOTE — Progress Notes (Signed)
[place]  [phone] [phone]     ID: Danielle Knapp DOB: 05-06-1956  MR#: [id number]  FGH#:[id number]  Patient Care Team: Harlan Stains, MD as PCP - General (Family Medicine) Mauro Kaufmann, RN as Oncology Nurse Navigator Rockwell Germany, RN as Oncology Nurse Navigator Coralie Keens, MD as Consulting Physician (General Surgery) Sitlaly Gudiel, Virgie Dad, MD as Consulting Physician (Oncology) Kyung Rudd, MD as Consulting Physician (Radiation Oncology) Yisroel Ramming, Everardo All, MD as Consulting Physician (Obstetrics and Gynecology) Remi Haggard, MD as Consulting Physician (Urology) Lafonda Mosses, MD as Consulting Physician (Gynecologic Oncology) Chauncey Cruel, MD OTHER MD:  CHIEF COMPLAINT: Ductal carcinoma in situ  CURRENT TREATMENT: Definitive surgery pending   HISTORY OF CURRENT ILLNESS: Danielle Knapp has a personal history of bladder cancer and endometrial cancer. She previously underwent genetic testing on 12/07/2020, and results were negative.  She had routine screening mammography on 01/03/2021 showing a possible abnormality in the right breast. She underwent right diagnostic mammography with tomography at [facility] on 01/11/2021 showing: breast density category C; 2.3 cm cluster of calcifications in upper-outer right breast.  Accordingly on 01/23/2021 she proceeded to biopsy of the right breast area in question. The pathology from this procedure ([id number]) showed: Ductal carcinoma in situ, low-grade  Prognostic indicators significant for: estrogen receptor, 100% positive and progesterone receptor, 90% positive, with strong to moderate intensity.   Cancer Staging Ductal carcinoma in situ (DCIS) of right breast Staging form: Breast, AJCC 8th Edition - Clinical stage from 01/31/2021: Stage 0 (cTis (DCIS), cN0, cM0, ER+, PR+) - Unsigned Stage prefix: Initial diagnosis Nuclear grade: G1   The patient's subsequent history is as  detailed below.   INTERVAL HISTORY: Danielle "Jackelyn Poling" was evaluated in the multidisciplinary breast cancer clinic on 01/31/2021 accompanied by her husband Danielle Knapp. Her case was also presented at the multidisciplinary breast cancer conference on the same day. At that time a preliminary plan was proposed: Breast conserving surgery, adjuvant radiation, consider antiestrogens   REVIEW OF SYSTEMS: There were no specific symptoms leading to the original mammogram, which was routinely scheduled. The patient denies unusual headaches, visual changes, nausea, vomiting, stiff neck, dizziness, or gait imbalance. There has been no cough, phlegm production, or pleurisy, no chest pain or pressure, and no change in bowel or bladder habits. The patient denies fever, rash, bleeding, unexplained fatigue or unexplained weight loss.  She is still recovering from her recent hysterectomy but generally likes to take walks and do yard work for exercise.  A detailed review of systems was otherwise entirely negative.   COVID 19 VACCINATION STATUS: Status post Pfizer vaccine x2+1 booster as of June 2022   PAST MEDICAL HISTORY: Past Medical History:  Diagnosis Date  . Allergic rhinitis   . Allergy   . Anxiety   . Bladder cancer ([place]) 10/17/2020  . Borderline glaucoma, left    no eye drops  . BPPV (benign paroxysmal positional vertigo)   . Breast cancer ([place]) 01/23/2021  . Cancer ([place]) 10/17/2020  . Cataract   . Chondromalacia of patella    left  . Coronary artery disease    cardiac cath 11-03-2017 done for positive ETT,  showed very miminal nonobstrutive cad, ef 55-65%, non caridac chest pain  . Diverticulosis of colon   . Endometrial cancer ([place])   . Family history of breast cancer 12/08/2020  . Family history of ovarian cancer 12/08/2020  . Family history of prostate cancer 12/08/2020  . Frequency of  urination   . GERD (gastroesophageal reflux disease)   . History of chest pain 10/14/2017   ED visit w/ admission,   hypertensive urgency;  follow up with dr Doylene Canard--- had outpatient ETT 10-21-2017 positive ischemia, echo 10-16-2017 G1DD, 60-65%;    s/p cardiac cath 11-03-2017 w/ very miminal nonobstructive cad , chest pain non cardiac  . History of diverticulitis   . History of kidney stones   . HSV-1 infection    fever blisters  . Hx of adenomatous colonic polyps 02/08/2018  . Hypertension    followed by pcp  . MDD (major depressive disorder)   . OA (osteoarthritis)    left knee, shoulder  . Osteoporosis   . Polycythemia    long hx followed by pcp and  sees hematology/ oncology--- dr m. Julien Nordmann on as needed basis, chronic and told to donate blood regularly  . Stenosis of cervix   . Uterine mass   . Wears glasses     PAST SURGICAL HISTORY: Past Surgical History:  Procedure Laterality Date  . ABDOMINAL HYSTERECTOMY    . CATARACT EXTRACTION W/ INTRAOCULAR LENS  IMPLANT, BILATERAL  2013  . COLONOSCOPY  02-02-2018 dr Carlean Purl  . CYSTOSCOPY  10/17/2020   Procedure: CYSTOSCOPY;  Surgeon: Nunzio Cobbs, MD;  Location: [facility];  Service: Gynecology;;  . Consuela Mimes N/A 11/09/2020   Procedure: Consuela Mimes;  Surgeon: Remi Haggard, MD;  Location: [facility];  Service: Urology;  Laterality: N/A;  Dr. Milford Cage needs to go first. [name] and rigid bx forceps.  Marland Kitchen DIAGNOSTIC LAPAROSCOPY  1996  . DILATATION & CURETTAGE/HYSTEROSCOPY WITH MYOSURE N/A 10/17/2020   Procedure: DILATATION & CURETTAGE/HYSTEROSCOPY WITH  MYOSURE RESECTION OF ENDOMETRIAL MASS,  OPENING OF CERVICAL STENOSIS, DRAINAGE OF UTERINE FLUID;  Surgeon: Nunzio Cobbs, MD;  Location: [facility];  Service: Gynecology;  Laterality: N/A;  . EYE SURGERY    . FRACTURE SURGERY    . HEEL SPUR SURGERY Right 2015   Dr. Durward Fortes  . HEMORRHOID SURGERY  2004  . LEFT HEART CATH AND CORONARY ANGIOGRAPHY N/A 11/03/2017   Procedure: LEFT HEART CATH AND CORONARY ANGIOGRAPHY;  Surgeon: Jettie Booze, MD;   Location: [facility];  Service: Cardiovascular;  Laterality: N/A;  . LYMPH NODE DISSECTION N/A 11/09/2020   Procedure: BILATERAL LYMPH NODE DISSECTION;MINI LAPAROTOMY;  Surgeon: Lafonda Mosses, MD;  Location: [facility];  Service: Gynecology;  Laterality: N/A;  . OPERATIVE ULTRASOUND N/A 10/17/2020   Procedure: OPERATIVE ULTRASOUND;  Surgeon: Nunzio Cobbs, MD;  Location: [facility];  Service: Gynecology;  Laterality: N/A;  . ROBOTIC ASSISTED TOTAL HYSTERECTOMY WITH BILATERAL SALPINGO OOPHERECTOMY Bilateral 11/09/2020   Procedure: XI ROBOTIC ASSISTED TOTAL HYSTERECTOMY WITH BILATERAL SALPINGO OOPHORECTOMY,;  Surgeon: Lafonda Mosses, MD;  Location: [facility];  Service: Gynecology;  Laterality: Bilateral;  . SEPTOPLASTY  x2 1970's  . TONSILLECTOMY  age 28  . TRANSURETHRAL RESECTION OF BLADDER TUMOR N/A 11/09/2020   Procedure: TRANSURETHRAL RESECTION OF BLADDER TUMOR (TURBT);  Surgeon: Remi Haggard, MD;  Location: [facility];  Service: Urology;  Laterality: N/A;    FAMILY HISTORY: Family History  Problem Relation Age of Onset  . Osteoporosis Mother   . Heart attack Mother   . Alzheimer's disease Mother   . Arthritis Mother   . Depression Mother   . Diabetes Father   . Hypertension Father   . Heart failure Father   . Alcohol abuse Father   .  COPD Father   . Ovarian cancer Sister 38  . Stroke Maternal Grandmother   . Prostate cancer Maternal Grandfather 74       metastatic  . Cancer Maternal Grandfather   . Breast cancer Other        MGM's sister; dx after 79  . Vision loss Paternal Grandmother   . Colon cancer Neg Hx   . Colon polyps Neg Hx   . Esophageal cancer Neg Hx   . Rectal cancer Neg Hx   . Stomach cancer Neg Hx   . Endometrial cancer Neg Hx   . Pancreatic cancer Neg Hx   The patient's father died at the age of 78 from a stroke.  The patient's mother died with congestive heart failure and Alzheimer's disease at age 28.  The patient had no  brothers, 1 sister.  That sister has a history of ovarian cancer.   GYNECOLOGIC HISTORY:  Patient's last menstrual period was 09/03/2011. Menarche: 65 years old [place] P 0 LMP 2013 Hysterectomy? Yes, 11/09/2020 for endometrial cancer BSO? yes   SOCIAL HISTORY: (updated 01/2021)  Danielle Blades "Jackelyn Poling" works as a Science writer for [employer].  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  The patient has no birth children but she has a stepdaughter age 2 and a 59 age 64 both living in [place].  There are no grandchildren.  The patient is a Psychologist, forensic.  ADVANCED DIRECTIVES: In the absence of any documents to the contrary the patient's husband is her healthcare power of attorney   HEALTH MAINTENANCE: Social History   Tobacco Use  . Smoking status: Never Smoker  . Smokeless tobacco: Never Used  Vaping Use  . Vaping Use: Never used  Substance Use Topics  . Alcohol use: Yes    Alcohol/week: 2.0 standard drinks    Types: 2 Glasses of wine per week    Comment: occasional wine  . Drug use: Never     Colonoscopy: 01/2018 (Dr. Carlean Purl), recall 2024  PAP: 09/2020, negative  Bone density: 11/2018, -1.7   Allergies  Allergen Reactions  . Ciprofloxacin Hcl Anaphylaxis and Rash  . Erythromycin Diarrhea and Other (See Comments)  . Metronidazole Anaphylaxis, Rash and Other (See Comments)  . Tolmetin Other (See Comments)    Can tolerate Ibuprofen.  Can tolerate Ibuprofen.   . Lisinopril Cough  . Eszopiclone Other (See Comments)    Current Outpatient Medications  Medication Sig Dispense Refill  . amLODipine (NORVASC) 2.5 MG tablet Take 2.5 mg by mouth daily.  1  . buPROPion (WELLBUTRIN XL) 300 MG 24 hr tablet Take 300 mg by mouth daily.    . fluticasone (FLONASE) 50 MCG/ACT nasal spray Place 2 sprays into both nostrils daily.    Marland Kitchen ibuprofen (ADVIL) 800 MG tablet TAKE 1 TABLET(800 MG) BY  MOUTH EVERY 8 HOURS AFTER SURGERY AS NEEDED FOR MODERATE PAIN 30 tablet 0  . losartan (COZAAR) 100 MG tablet Take 100 mg by mouth daily.    . Multiple Vitamin (MULTIVITAMIN WITH MINERALS) TABS tablet Take 1 tablet by mouth every evening.    . Calcium Carb-Cholecalciferol (CALCIUM 500+D3 PO) Take 1 tablet by mouth every evening.    . loratadine (CLARITIN) 10 MG tablet Take 10 mg by mouth daily. (Patient not taking: Reported on 01/31/2021)    . omeprazole (PRILOSEC) 40 MG capsule Take 40 mg by mouth daily. (Patient not taking: Reported on 01/31/2021)    .  oxyCODONE-acetaminophen (PERCOCET) 5-325 MG tablet Take 1 tablet by mouth every 4 (four) hours as needed for severe pain. (Patient not taking: Reported on 01/31/2021) 15 tablet 0  . senna-docusate (SENOKOT-S) 8.6-50 MG tablet Take 2 tablets by mouth at bedtime. For AFTER surgery, do not take if having diarrhea (Patient not taking: Reported on 01/31/2021) 30 tablet 0  . sertraline (ZOLOFT) 100 MG tablet Take 150 mg by mouth daily.     . valACYclovir (VALTREX) 1000 MG tablet Take 2,000 mg by mouth 2 (two) times daily as needed (for fever blisters).     . zolpidem (AMBIEN) 10 MG tablet Take 5 mg by mouth at bedtime.     No current facility-administered medications for this visit.    OBJECTIVE: White woman who appears stated  Vitals:   01/31/21 1240  BP: (!) 143/77  Pulse: 67  Resp: 18  Temp: 97.7 F (36.5 C)  SpO2: 98%     Body mass index is 29.19 kg/m.   Wt Readings from Last 3 Encounters:  01/31/21 186 lb 6.4 oz (84.6 kg)  11/27/20 185 lb 4.8 oz (84.1 kg)  11/09/20 178 lb 9.6 oz (81 kg)      ECOG FS:1 - Symptomatic but completely ambulatory  Ocular: Sclerae unicteric, pupils round and equal Ear-nose-throat: Wearing a mask Lymphatic: No cervical or supraclavicular adenopathy Lungs no rales or rhonchi Heart regular rate and rhythm Abd soft, nontender, positive bowel sounds MSK no focal spinal tenderness, no joint edema Neuro: non-focal,  well-oriented, appropriate affect Breasts: I do not palpate any mass in the right breast and there is no ecchymosis.  The left breast and both axillae are benign.   LAB RESULTS:  CMP     Component Value Date/Time   NA 142 01/31/2021 1210   NA 147 (H) 10/31/2017 0829   NA 141 08/11/2012 0935   K 4.3 01/31/2021 1210   K 3.6 08/11/2012 0935   CL 105 01/31/2021 1210   CL 105 08/11/2012 0935   CO2 26 01/31/2021 1210   CO2 26 08/11/2012 0935   GLUCOSE 100 (H) 01/31/2021 1210   GLUCOSE 115 (H) 08/11/2012 0935   BUN 13 01/31/2021 1210   BUN 18 10/31/2017 0829   BUN 18.0 08/11/2012 0935   CREATININE 0.78 01/31/2021 1210   CREATININE 0.8 08/11/2012 0935   CALCIUM 9.6 01/31/2021 1210   CALCIUM 9.9 08/11/2012 0935   PROT 7.1 01/31/2021 1210   PROT 7.4 08/11/2012 0935   ALBUMIN 4.2 01/31/2021 1210   ALBUMIN 4.1 08/11/2012 0935   AST 22 01/31/2021 1210   AST 17 08/11/2012 0935   ALT 26 01/31/2021 1210   ALT 24 08/11/2012 0935   ALKPHOS 69 01/31/2021 1210   ALKPHOS 53 08/11/2012 0935   BILITOT 0.8 01/31/2021 1210   BILITOT 0.79 08/11/2012 0935   GFRNONAA >60 01/31/2021 1210   GFRAA >60 10/25/2019 1237    No results found for: TOTALPROTELP, ALBUMINELP, A1GS, A2GS, BETS, BETA2SER, GAMS, MSPIKE, SPEI  Lab Results  Component Value Date   WBC 6.8 01/31/2021   NEUTROABS 4.7 01/31/2021   HGB 16.2 (H) 01/31/2021   HCT 43.3 01/31/2021   MCV 85.4 01/31/2021   PLT 263 01/31/2021    No results found for: LABCA2  No components found for: IOEVOJ500  No results for input(s): INR in the last 168 hours.  No results found for: LABCA2  No results found for: XFG182  No results found for: XHB716  No results found for: RCV893  No results  found for: CA2729  No components found for: HGQUANT  No results found for: CEA1 / No results found for: CEA1   No results found for: AFPTUMOR  No results found for: CHROMOGRNA  No results found for: KPAFRELGTCHN, LAMBDASER,  KAPLAMBRATIO (kappa/lambda light chains)  No results found for: HGBA, HGBA2QUANT, HGBFQUANT, HGBSQUAN (Hemoglobinopathy evaluation)   Lab Results  Component Value Date   LDH 188 08/11/2012    Lab Results  Component Value Date   IRON 192 (H) 10/25/2019   TIBC 424 10/25/2019   IRONPCTSAT 45 10/25/2019   (Iron and TIBC)  Lab Results  Component Value Date   FERRITIN 39 10/25/2019    Urinalysis    Component Value Date/Time   COLORURINE STRAW (A) 11/06/2020 1409   APPEARANCEUR CLEAR 11/06/2020 1409   LABSPEC 1.005 11/06/2020 1409   PHURINE 6.0 11/06/2020 1409   GLUCOSEU NEGATIVE 11/06/2020 1409   HGBUR NEGATIVE 11/06/2020 1409   BILIRUBINUR NEGATIVE 11/06/2020 1409   BILIRUBINUR n 02/28/2016 0837   KETONESUR NEGATIVE 11/06/2020 1409   PROTEINUR NEGATIVE 11/06/2020 1409   UROBILINOGEN negative 02/28/2016 0837   NITRITE NEGATIVE 11/06/2020 1409   LEUKOCYTESUR NEGATIVE 11/06/2020 1409    STUDIES: No results found.   ELIGIBLE FOR AVAILABLE RESEARCH PROTOCOL: no  ASSESSMENT: 65 y.o. [place] woman with  (1) status post total abdominal hysterectomy with bilateral salpingo-oophorectomy 11/09/2020 for a n endometrioid carcinoma, pT1a, pN0, FIGO grade 2  (a) no involvement of the uterine serosa or cervical stroma or adnexa,  (b) a total of 4 left and 5 right pelvic lymph nodes removed  (2) low-grade noninvasive papillary urothelial cancer biopsied 11/09/2020 (followed by [facility] Cage)  (3) genetics testing 12/24/2020 through the [facility] Common Hereditary Cancers +RNA Panel found no deleterious mutations in APC, ATM, AXIN2, BARD1, BMPR1A, BRCA1, BRCA2, BRIP1, CDH1, CDK4*, CDKN2A (p14ARF)*, CDKN2A (p16INK4a)*, CHEK2, CTNNA1, DICER1, EPCAM (Deletion/duplication testing only), GREM1 (promoter region deletion/duplication testing only), KIT, MEN1, MLH1, MSH2, MSH3, MSH6, MUTYH, NBN, NF1, NHTL1, PALB2, PDGFRA*, PMS2, POLD1, POLE, PTEN, RAD50, RAD51C, RAD51D, SDHB, SDHC, SDHD, SMAD4,  SMARCA4. STK11, TP53, TSC1, TSC2, and VHL.  The following genes were evaluated for sequence changes only: SDHA and HOXB13 c.251G>A variant only.  RNA analysis is not performed for the * genes.   (4) status post right breast biopsy 01/23/2021 for ductal carcinoma in situ, low-grade, estrogen and progesterone receptor positive  (5) definitive breast surgery pending  (6) adjuvant radiation  (7) consider antiestrogen  PLAN: I met today with Danielle Knapp to review her new diagnosis. Specifically we discussed the biology of her breast cancer, its diagnosis, staging, treatment  options and prognosis. Danielle Knapp understands that in noninvasive ductal carcinoma, also called ductal carcinoma in situ ("DCIS") the breast cancer cells remain trapped in the ducts were they started. They cannot travel to a vital organ. For that reason these cancers in themselves are not life-threatening.  If the whole breast is removed then all the ducts are removed and since the cancer cells are trapped in the ducts, the cure rate with mastectomy for noninvasive breast cancer is approximately 99%. Nevertheless we recommend lumpectomy, because there is no survival advantage to mastectomy and because the cosmetic result is generally superior with breast conservation.  Since the patient is keeping her breasts, there will be some risk of recurrence. The recurrence can only be in the same breast since, again, the cells are trapped in the ducts. There is no between these 3 cancers to the best of our ability to tell and specifically there  was no evidence of Lynch syndrome on her lab or from one breast to the other. The risk of local recurrence is cut by more than half with radiation, which is standard in this situation.  In estrogen receptor positive cancers like Danielle Knapp, anti-estrogens can also be considered. They will further reduce the risk of recurrence by one half. In addition anti-estrogens will lower the risk of a new breast cancer  developing in either breast, also by one half. That risk otherwise approaches 1% per year.   Accordingly the overall plan is for surgery, followed by radiation, then a discussion of anti-estrogens.  Danielle Knapp of course was recently diagnosed with endometrial and bladder cancer.  I reassured her that there is no clear association between these 3 cancers and specifically her genetics did not show any evidence of Lynch syndrome.  She wondered if she needed a PET scan and we discussed why that would not be appropriate or helpful.  She is interested in moving up her colonoscopy and if she decides to do that she will contact Dr. Carlean Purl, but she understands there is no clear association between breast and colon cancer.  Danielle Knapp has a good understanding of the overall plan. She agrees with it. She knows the goal of treatment in her case is cure. She will call with any problems that may develop before her next visit here.  Total encounter time 55 minutes.Sarajane Jews C. Eleanora Guinyard, MD 01/31/2021 1:51 PM Medical Oncology and Hematology [facility], [facility], [facility], [place] [address] Tel. [phone]    Fax. [phone]   This document serves as a record of services personally performed by Lurline Del, MD. It was created on his behalf by Wilburn Mylar, a trained medical scribe. The creation of this record is based on the scribe's personal observations and the provider's statements to them.   I, Lurline Del MD, have reviewed the above documentation for accuracy and completeness, and I agree with the above.    *Total Encounter Time as defined by the Centers for Medicare and Medicaid Services includes, in addition to the face-to-face time of a patient visit (documented in the note above) non-face-to-face time: obtaining and reviewing outside history, ordering and reviewing medications, tests or procedures, care coordination (communications with other health care professionals or  caregivers) and documentation in the medical record.

## 2021-02-01 ENCOUNTER — Telehealth: Payer: Self-pay | Admitting: Oncology

## 2021-02-01 ENCOUNTER — Telehealth: Payer: Self-pay | Admitting: Genetic Counselor

## 2021-02-01 ENCOUNTER — Encounter: Payer: Self-pay | Admitting: *Deleted

## 2021-02-01 ENCOUNTER — Encounter: Payer: Self-pay | Admitting: General Practice

## 2021-02-01 DIAGNOSIS — D0511 Intraductal carcinoma in situ of right breast: Secondary | ICD-10-CM

## 2021-02-01 NOTE — Progress Notes (Signed)
[name]  Reached Danielle Knapp "Debbie" by phone following Breast Multidisciplinary Clinic to introduce [place] team/resources, reviewing distress screen per protocol.  The patient scored a 10 on the Psychosocial Distress Thermometer which indicates severe distress. Also assessed for distress and other psychosocial needs.   ONCBCN DISTRESS SCREENING 02/01/2021  Screening Type Initial Screening  Distress experienced in past week (1-10) 10  Emotional problem type Depression;Adjusting to illness  Information Concerns Type Lack of info about diagnosis;Lack of info about treatment;Lack of info about complementary therapy choices;Lack of info about maintaining fitness  Physical Problem type Sleep/insomnia  Referral to support programs Yes   Debbie reports that [facility] resolved her information concerns, gave her confidence in her team, and reduced her distress to a 3. She is interested in support programming and plans to review her packet in the coming days. Faith and perspective (including from her endometrial and bladder cancer care) are helping her cope.    Follow up needed: No. Per Debbie, no other needs or concerns at this time. She plans to contact chaplain as needed/desired.   [place], [place], [facility] Pager [phone] Voicemail [phone]

## 2021-02-01 NOTE — Telephone Encounter (Signed)
Scheduled appointment per 06/01 sch msg. Patient is aware.

## 2021-02-01 NOTE — Telephone Encounter (Signed)
Received call from Ms. Meske that [employer] denied genetic testing claim.  Message sent to [facility] lab with request to give patient a call to discuss OOP cost.  Discussed that lab may offer price that is lower than price listed on EOB.  Phone number to [facility] provided.

## 2021-02-06 ENCOUNTER — Encounter: Payer: Self-pay | Admitting: *Deleted

## 2021-02-06 ENCOUNTER — Telehealth: Payer: Self-pay | Admitting: *Deleted

## 2021-02-06 NOTE — Telephone Encounter (Signed)
Spoke to pt regarding [place] from 6.1.22. Denies questions or concerns regarding dx or treatment care plan. Encourage pt to call with needs. Received verbal understanding.

## 2021-02-07 ENCOUNTER — Inpatient Hospital Stay
Admission: RE | Admit: 2021-02-07 | Discharge: 2021-02-07 | Disposition: A | Discharge status: Home / Self Care | Payer: Self-pay | Source: Ambulatory Visit | Attending: Radiation Oncology | Admitting: Radiation Oncology

## 2021-02-07 ENCOUNTER — Other Ambulatory Visit: Payer: Self-pay | Admitting: Radiation Oncology

## 2021-02-07 DIAGNOSIS — D0511 Intraductal carcinoma in situ of right breast: Secondary | ICD-10-CM

## 2021-02-08 ENCOUNTER — Other Ambulatory Visit: Payer: Self-pay

## 2021-02-08 ENCOUNTER — Encounter (HOSPITAL_BASED_OUTPATIENT_CLINIC_OR_DEPARTMENT_OTHER): Payer: Self-pay | Admitting: Surgery

## 2021-02-09 ENCOUNTER — Telehealth: Payer: Self-pay

## 2021-02-09 MED ORDER — ENSURE PRE-SURGERY PO LIQD: 296.0000 mL | Freq: Once | ORAL | Status: DC

## 2021-02-09 NOTE — Telephone Encounter (Signed)
Spoke with Danielle Knapp this morning to schedule her follow up appointment. She is scheduled for 9/2 at 1:15. Patient is in agreement of date and time.

## 2021-02-09 NOTE — Progress Notes (Signed)

## 2021-02-15 NOTE — H&P (Signed)
Danielle Knapp Appointment: 01/31/2021 1:00 PM Location: [facility] Patient #: [id number] DOB: [birth year] Undefined / Language: Danielle Knapp / Race: White Female   History of Present Illness Danielle Canary A. Ninfa Linden MD; 01/31/2021 1:25 PM) The patient is a 65 year old female who presents with breast cancer.   Chief complaint: DCIS right breast  This is an 65 year old female who was found on recent screening mammography to have a 2 to 3 cm area of calcifications in the upper outer quadrant of the right breast which were suspicious.  She had a biopsy of the most posterior aspect of this showing low-grade DCIS.  She has had no previous problems regarding her breast.  She recently did have a low-grade endometrial cancer as well as a small bladder cancer.  She has a sister who had ovarian cancer.  She has had genetic testing in the past that was negative for the BRCA gene.  She denies nipple discharge.  She is otherwise without complaints.  She has had no issues regarding general anesthesia.    Past Surgical History Danielle Slipper, RN; 01/31/2021 8:20 AM) Breast Biopsy   Right. Cataract Surgery   Bilateral. Foot Surgery   Right. Hemorrhoidectomy   Hysterectomy (due to cancer) - Complete   Oral Surgery    Diagnostic Studies History Danielle Slipper, RN; 01/31/2021 8:20 AM) Colonoscopy   1-5 years ago Mammogram   within last year Pap Smear   1-5 years ago  Medication History Danielle Slipper, RN; 01/31/2021 8:20 AM) Medications Reconciled   Social History Danielle Slipper, RN; 01/31/2021 8:20 AM) Alcohol use   Occasional alcohol use. Caffeine use   Carbonated beverages, Tea. No drug use   Tobacco use   Never smoker.  Family History Danielle Slipper, RN; 01/31/2021 8:20 AM) Alcohol Abuse   Father. Diabetes Mellitus   Father. Heart Disease   Mother. Heart disease in female family member before age 56   Hypertension   Father. Ovarian Cancer   Sister. Respiratory Condition   Father.  Pregnancy / Birth  History Danielle Slipper, RN; 01/31/2021 8:20 AM) Age at menarche   43 years. Age of menopause   [age] Contraceptive History   Oral contraceptives. Gravida   0 Para   0 Regular periods    Other Problems Danielle Slipper, RN; 01/31/2021 8:20 AM) Anxiety Disorder   Arthritis   Bladder Problems   Breast Cancer   Cancer   Cervical Cancer   Depression   Diverticulosis   Hemorrhoids   High blood pressure   Inguinal Hernia   Kidney Stone   Lump In Breast   Oophorectomy      Review of Systems Danielle Slipper RN; 01/31/2021 8:20 AM) General Not Present- Appetite Loss, Chills, Fatigue, Fever, Night Sweats, Weight Gain and Weight Loss. Skin Present- Change in Wart/Mole and New Lesions. Not Present- Dryness, Hives, Jaundice, Non-Healing Wounds, Rash and Ulcer. HEENT Present- Visual Disturbances and Wears glasses/contact lenses. Not Present- Earache, Hearing Loss, Hoarseness, Nose Bleed, Oral Ulcers, Ringing in the Ears, Seasonal Allergies, Sinus Pain, Sore Throat and Yellow Eyes. Respiratory Present- Snoring. Not Present- Bloody sputum, Chronic Cough, Difficulty Breathing and Wheezing. Breast Present- Breast Mass. Not Present- Breast Pain, Nipple Discharge and Skin Changes. Cardiovascular Not Present- Chest Pain, Difficulty Breathing Lying Down, Leg Cramps, Palpitations, Rapid Heart Rate, Shortness of Breath and Swelling of Extremities. Gastrointestinal Not Present- Abdominal Pain, Bloating, Bloody Stool, Change in Bowel Habits, Chronic diarrhea, Constipation, Difficulty Swallowing, Excessive gas, Gets full quickly at meals, Hemorrhoids, Indigestion, Nausea,  Rectal Pain and Vomiting. Female Genitourinary Present- Urgency. Not Present- Frequency, Nocturia, Painful Urination and Pelvic Pain. Musculoskeletal Present- Joint Pain. Not Present- Back Pain, Joint Stiffness, Muscle Pain, Muscle Weakness and Swelling of Extremities. Neurological Not Present- Decreased Memory, Fainting, Headaches, Numbness, Seizures,  Tingling, Tremor, Trouble walking and Weakness. Psychiatric Present- Anxiety and Depression. Not Present- Bipolar, Change in Sleep Pattern, Fearful and Frequent crying. Endocrine Not Present- Cold Intolerance, Excessive Hunger, Hair Changes, Heat Intolerance, Hot flashes and New Diabetes. Hematology Not Present- Blood Thinners, Easy Bruising, Excessive bleeding, Gland problems, HIV and Persistent Infections.   Physical Exam (Danielle Rusher A. Ninfa Linden MD; 01/31/2021 1:26 PM) The physical exam findings are as follows: Note:  she appears well on exam  Breasts are normal in appearance. There is no palpable chronic breast mass. She is well-appearing, biopsies. The nipple areolar complex is normal. There is no axillary adenopathy.  Lungs clear CV RRR Abdomen soft, NT Neuro grossly intact    Assessment & Plan (Danielle Kubicek A. Ninfa Linden MD; 01/31/2021 1:29 PM) DUCTAL CARCINOMA IN SITU (DCIS) OF RIGHT BREAST (D05.11) Impression: I reviewed her mammograms and ultrasound as well as her pathology results. We have also discussed her this morning in the multidisciplinary breast cancer conference. She has a low-grade ductal carcinoma in situ right breast measuring 2 to 3 cm in size. Patient was presented to ER 11% PR positive. I discussed with radiology. She was originally scheduled to have a second biopsy of the most anterior extent. We have decided that this can be bracketed instead. I discussed the diagnosis with the patient and her husband. We discussed surgical options including the breast conservation with a lumpectomy versus a mastectomy. We discussed the pros and cons of each. She wished to proceed with breast conservation. We next proceeded with discussing a radioactive seed guided right breast lumpectomy. Explained the surgical procedure in detail. We discussed the risks which includes but is not limited to bleeding, infection, injury to surrounding structures, the need for further surgery if the margins are  positive, cardiopulmonary issues, postoperative recovery, etc. She understands wished to proceed with surgery which will be scheduled.

## 2021-02-16 ENCOUNTER — Ambulatory Visit (HOSPITAL_BASED_OUTPATIENT_CLINIC_OR_DEPARTMENT_OTHER)
Admission: RE | Admit: 2021-02-16 | Discharge: 2021-02-16 | Disposition: A | Discharge status: Home / Self Care | Payer: 59 | Attending: Surgery | Admitting: Surgery

## 2021-02-16 ENCOUNTER — Ambulatory Visit (HOSPITAL_BASED_OUTPATIENT_CLINIC_OR_DEPARTMENT_OTHER): Payer: 59 | Admitting: Certified Registered"

## 2021-02-16 ENCOUNTER — Encounter (HOSPITAL_BASED_OUTPATIENT_CLINIC_OR_DEPARTMENT_OTHER)
Admission: RE | Disposition: A | Discharge status: Home / Self Care | Payer: Self-pay | Source: Home / Self Care | Attending: Surgery

## 2021-02-16 ENCOUNTER — Encounter (HOSPITAL_BASED_OUTPATIENT_CLINIC_OR_DEPARTMENT_OTHER): Payer: Self-pay | Admitting: Surgery

## 2021-02-16 ENCOUNTER — Other Ambulatory Visit: Payer: Self-pay

## 2021-02-16 DIAGNOSIS — Z8551 Personal history of malignant neoplasm of bladder: Secondary | ICD-10-CM | POA: Insufficient documentation

## 2021-02-16 DIAGNOSIS — D0511 Intraductal carcinoma in situ of right breast: Secondary | ICD-10-CM | POA: Insufficient documentation

## 2021-02-16 DIAGNOSIS — D241 Benign neoplasm of right breast: Secondary | ICD-10-CM | POA: Diagnosis not present

## 2021-02-16 DIAGNOSIS — Z8542 Personal history of malignant neoplasm of other parts of uterus: Secondary | ICD-10-CM | POA: Insufficient documentation

## 2021-02-16 HISTORY — PX: BREAST LUMPECTOMY WITH RADIOACTIVE SEED LOCALIZATION: SHX6424

## 2021-02-16 SURGERY — BREAST LUMPECTOMY WITH RADIOACTIVE SEED LOCALIZATION
Anesthesia: General | Site: Breast | Laterality: Right

## 2021-02-16 MED ORDER — PROPOFOL 500 MG/50ML IV EMUL
INTRAVENOUS | Status: AC
  Filled 2021-02-16: qty 50

## 2021-02-16 MED ORDER — BUPIVACAINE-EPINEPHRINE 0.5% -1:200000 IJ SOLN
INTRAMUSCULAR | Status: DC | PRN
  Administered 2021-02-16: 20 mL

## 2021-02-16 MED ORDER — LIDOCAINE HCL (PF) 2 % IJ SOLN
INTRAMUSCULAR | Status: AC
  Filled 2021-02-16: qty 5

## 2021-02-16 MED ORDER — MIDAZOLAM HCL 2 MG/2ML IJ SOLN
INTRAMUSCULAR | Status: AC
  Filled 2021-02-16: qty 2

## 2021-02-16 MED ORDER — ACETAMINOPHEN 500 MG PO TABS: 1000.0000 mg | ORAL_TABLET | Freq: Once | ORAL | Status: DC

## 2021-02-16 MED ORDER — OXYCODONE HCL 5 MG PO TABS
ORAL_TABLET | ORAL | Status: AC
  Filled 2021-02-16: qty 1

## 2021-02-16 MED ORDER — BUPIVACAINE-EPINEPHRINE (PF) 0.5% -1:200000 IJ SOLN
INTRAMUSCULAR | Status: AC
  Filled 2021-02-16: qty 60

## 2021-02-16 MED ORDER — DEXAMETHASONE SODIUM PHOSPHATE 4 MG/ML IJ SOLN
INTRAMUSCULAR | Status: DC | PRN
  Administered 2021-02-16: 8 mg via INTRAVENOUS

## 2021-02-16 MED ORDER — ACETAMINOPHEN 500 MG PO TABS
1000.0000 mg | ORAL_TABLET | ORAL | Status: AC
  Administered 2021-02-16: 1000 mg via ORAL

## 2021-02-16 MED ORDER — PHENYLEPHRINE HCL (PRESSORS) 10 MG/ML IV SOLN
INTRAVENOUS | Status: DC | PRN
  Administered 2021-02-16: 80 ug via INTRAVENOUS

## 2021-02-16 MED ORDER — MIDAZOLAM HCL 5 MG/5ML IJ SOLN
INTRAMUSCULAR | Status: DC | PRN
  Administered 2021-02-16: 2 mg via INTRAVENOUS

## 2021-02-16 MED ORDER — PROMETHAZINE HCL 25 MG/ML IJ SOLN: 6.2500 mg | INTRAMUSCULAR | Status: DC | PRN

## 2021-02-16 MED ORDER — PHENYLEPHRINE 40 MCG/ML (10ML) SYRINGE FOR IV PUSH (FOR BLOOD PRESSURE SUPPORT)
PREFILLED_SYRINGE | INTRAVENOUS | Status: AC
  Filled 2021-02-16: qty 10

## 2021-02-16 MED ORDER — LACTATED RINGERS IV SOLN: INTRAVENOUS | Status: DC

## 2021-02-16 MED ORDER — HYDROMORPHONE HCL 1 MG/ML IJ SOLN
0.2500 mg | INTRAMUSCULAR | Status: DC | PRN
  Administered 2021-02-16: 0.5 mg via INTRAVENOUS

## 2021-02-16 MED ORDER — FENTANYL CITRATE (PF) 100 MCG/2ML IJ SOLN
INTRAMUSCULAR | Status: AC
  Filled 2021-02-16: qty 2

## 2021-02-16 MED ORDER — MEPERIDINE HCL 25 MG/ML IJ SOLN: 6.2500 mg | INTRAMUSCULAR | Status: DC | PRN

## 2021-02-16 MED ORDER — PROPOFOL 10 MG/ML IV BOLUS
INTRAVENOUS | Status: DC | PRN
  Administered 2021-02-16: 200 mg via INTRAVENOUS

## 2021-02-16 MED ORDER — CEFAZOLIN SODIUM-DEXTROSE 2-4 GM/100ML-% IV SOLN
2.0000 g | INTRAVENOUS | Status: AC
  Administered 2021-02-16: 2 g via INTRAVENOUS

## 2021-02-16 MED ORDER — LIDOCAINE HCL (CARDIAC) PF 100 MG/5ML IV SOSY
PREFILLED_SYRINGE | INTRAVENOUS | Status: DC | PRN
  Administered 2021-02-16: 60 mg via INTRAVENOUS

## 2021-02-16 MED ORDER — ONDANSETRON HCL 4 MG/2ML IJ SOLN
INTRAMUSCULAR | Status: DC | PRN
  Administered 2021-02-16: 4 mg via INTRAVENOUS

## 2021-02-16 MED ORDER — ONDANSETRON HCL 4 MG/2ML IJ SOLN
INTRAMUSCULAR | Status: AC
  Filled 2021-02-16: qty 2

## 2021-02-16 MED ORDER — OXYCODONE HCL 5 MG PO TABS
5.0000 mg | ORAL_TABLET | Freq: Once | ORAL | Status: AC | PRN
  Administered 2021-02-16: 5 mg via ORAL

## 2021-02-16 MED ORDER — ACETAMINOPHEN 500 MG PO TABS
ORAL_TABLET | ORAL | Status: AC
  Filled 2021-02-16: qty 2

## 2021-02-16 MED ORDER — CHLORHEXIDINE GLUCONATE CLOTH 2 % EX PADS: 6.0000 | MEDICATED_PAD | Freq: Once | CUTANEOUS | Status: DC

## 2021-02-16 MED ORDER — HYDROMORPHONE HCL 1 MG/ML IJ SOLN
INTRAMUSCULAR | Status: AC
  Filled 2021-02-16: qty 0.5

## 2021-02-16 MED ORDER — OXYCODONE HCL 5 MG/5ML PO SOLN: 5.0000 mg | Freq: Once | ORAL | Status: AC | PRN

## 2021-02-16 MED ORDER — CEFAZOLIN SODIUM-DEXTROSE 2-4 GM/100ML-% IV SOLN
INTRAVENOUS | Status: AC
  Filled 2021-02-16: qty 100

## 2021-02-16 MED ORDER — DEXAMETHASONE SODIUM PHOSPHATE 10 MG/ML IJ SOLN
INTRAMUSCULAR | Status: AC
  Filled 2021-02-16: qty 1

## 2021-02-16 MED ORDER — TRAMADOL HCL 50 MG PO TABS: 50.0000 mg | ORAL_TABLET | Freq: Four times a day (QID) | ORAL | 0 refills | Status: DC | PRN

## 2021-02-16 MED ORDER — FENTANYL CITRATE (PF) 100 MCG/2ML IJ SOLN
INTRAMUSCULAR | Status: DC | PRN
  Administered 2021-02-16 (×2): 50 ug via INTRAVENOUS

## 2021-02-16 SURGICAL SUPPLY — 51 items
ADH SKN CLS APL DERMABOND .7 (GAUZE/BANDAGES/DRESSINGS) ×1
APL PRP STRL LF DISP 70% ISPRP (MISCELLANEOUS) ×1
APPLIER CLIP 9.375 MED OPEN (MISCELLANEOUS) ×2
APR CLP MED 9.3 20 MLT OPN (MISCELLANEOUS) ×1
BINDER BREAST 3XL (GAUZE/BANDAGES/DRESSINGS) IMPLANT
BINDER BREAST LRG (GAUZE/BANDAGES/DRESSINGS) IMPLANT
BINDER BREAST MEDIUM (GAUZE/BANDAGES/DRESSINGS) IMPLANT
BINDER BREAST XLRG (GAUZE/BANDAGES/DRESSINGS) ×1 IMPLANT
BINDER BREAST XXLRG (GAUZE/BANDAGES/DRESSINGS) IMPLANT
BLADE SURG 15 STRL LF DISP TIS (BLADE) ×1 IMPLANT
BLADE SURG 15 STRL SS (BLADE) ×2
CANISTER SUC SOCK COL 7IN (MISCELLANEOUS) IMPLANT
CANISTER SUCT 1200ML W/VALVE (MISCELLANEOUS) ×1 IMPLANT
CHLORAPREP W/TINT 26 (MISCELLANEOUS) ×2 IMPLANT
CLIP APPLIE 9.375 MED OPEN (MISCELLANEOUS) IMPLANT
COVER BACK TABLE 60X90IN (DRAPES) ×2 IMPLANT
COVER MAYO STAND STRL (DRAPES) ×2 IMPLANT
COVER PROBE W GEL 5X96 (DRAPES) ×2 IMPLANT
COVER WAND RF STERILE (DRAPES) IMPLANT
DECANTER SPIKE VIAL GLASS SM (MISCELLANEOUS) IMPLANT
DERMABOND ADVANCED (GAUZE/BANDAGES/DRESSINGS) ×1
DERMABOND ADVANCED .7 DNX12 (GAUZE/BANDAGES/DRESSINGS) ×1 IMPLANT
DRAPE LAPAROSCOPIC ABDOMINAL (DRAPES) ×2 IMPLANT
DRAPE UTILITY XL STRL (DRAPES) ×2 IMPLANT
ELECT REM PT RETURN 9FT ADLT (ELECTROSURGICAL) ×2
ELECTRODE REM PT RTRN 9FT ADLT (ELECTROSURGICAL) ×1 IMPLANT
GAUZE SPONGE 4X4 12PLY STRL LF (GAUZE/BANDAGES/DRESSINGS) IMPLANT
GLOVE SURG ENC MOIS LTX SZ6.5 (GLOVE) ×1 IMPLANT
GLOVE SURG SIGNA 7.5 PF LTX (GLOVE) ×2 IMPLANT
GLOVE SURG UNDER POLY LF SZ7 (GLOVE) ×2 IMPLANT
GOWN STRL REUS W/ TWL LRG LVL3 (GOWN DISPOSABLE) ×1 IMPLANT
GOWN STRL REUS W/ TWL XL LVL3 (GOWN DISPOSABLE) ×1 IMPLANT
GOWN STRL REUS W/TWL LRG LVL3 (GOWN DISPOSABLE)
GOWN STRL REUS W/TWL XL LVL3 (GOWN DISPOSABLE) ×4
KIT MARKER MARGIN INK (KITS) ×2 IMPLANT
NDL HYPO 25X1 1.5 SAFETY (NEEDLE) ×1 IMPLANT
NEEDLE HYPO 25X1 1.5 SAFETY (NEEDLE) ×2 IMPLANT
NS IRRIG 1000ML POUR BTL (IV SOLUTION) ×1 IMPLANT
PACK BASIN DAY SURGERY FS (CUSTOM PROCEDURE TRAY) ×2 IMPLANT
PENCIL SMOKE EVACUATOR (MISCELLANEOUS) ×2 IMPLANT
SLEEVE SCD COMPRESS KNEE MED (STOCKING) ×2 IMPLANT
SPONGE LAP 4X18 RFD (DISPOSABLE) ×2 IMPLANT
SUT MNCRL AB 4-0 PS2 18 (SUTURE) ×2 IMPLANT
SUT SILK 2 0 SH (SUTURE) IMPLANT
SUT VIC AB 3-0 SH 27 (SUTURE) ×2
SUT VIC AB 3-0 SH 27X BRD (SUTURE) ×1 IMPLANT
SYR CONTROL 10ML LL (SYRINGE) ×2 IMPLANT
TOWEL GREEN STERILE FF (TOWEL DISPOSABLE) ×2 IMPLANT
TRAY FAXITRON CT DISP (TRAY / TRAY PROCEDURE) ×2 IMPLANT
TUBE CONNECTING 20X1/4 (TUBING) ×1 IMPLANT
YANKAUER SUCT BULB TIP NO VENT (SUCTIONS) ×1 IMPLANT

## 2021-02-16 NOTE — Op Note (Signed)
RIGHT BREAST LUMPECTOMY WITH RADIOACTIVE SEED LOCALIZATION  Procedure Note  Danielle Knapp 02/16/2021   Pre-op Diagnosis: DCIS RIGHT BREAST     Post-op Diagnosis: same  Procedure(s): RIGHT BREAST LUMPECTOMY WITH RADIOACTIVE SEED LOCALIZATION  Surgeon(s): Coralie Keens, MD  Anesthesia: General  Staff:  Circulator: Ted Mcalpine, RN Scrub Person: Charisse March, RN  Estimated Blood Loss: Minimal               Specimens: sent to path  Indications: This is a 65 year old female with low-grade DCIS of the right breast.  She has a area of calcifications spanning about 3 cm.  The decision was made to proceed with a bracketed radioactive seed guided right breast lumpectomy  Procedure: The patient was brought to the operating room identifies correct patient.  She is placed upon the operating room table and general anesthesia was induced.  Her right breast was prepped and draped in usual sterile fashion.  I identified the radioactive seeds with the aid of the neoprobe in the upper outer quadrant of the right breast.  I anesthetized the skin over the area with Marcaine.  I then made a longitudinal incision with a scalpel.  I then dissected down to the breast tissue with electrocautery.  I worked toward the most medial seed first of the neoprobe.  I dissected medial to this and then down to the chest wall.  I then carried the dissection superiorly and inferiorly with the cautery working toward the more posterior lateral seed.  I stayed well around this going down to the chest wall under both seeds.  With the aid of the neoprobe I was then able to complete the lumpectomy removing both seeds in the single lumpectomy specimen.  The margins were then marked with pain.  An x-ray was performed for me that both seeds in the previous biopsy clip were in the lumpectomy specimen.  This was then sent to pathology for evaluation.  I achieved hemostasis with the cautery.  I anesthetized the incision  further with Marcaine.  I then closed the subtenons tissue with interrupted 3-0 Vicryl sutures and closed the skin with a running 4-0 Monocryl.  Dermabond was then applied.  The patient tolerated the procedure well.  All the counts were correct at the end of procedure.  The patient was then extubated in the operating room and taken in stable condition to the recovery room.          Coralie Keens   Date: 02/16/2021  Time: 8:16 AM

## 2021-02-16 NOTE — Anesthesia Postprocedure Evaluation (Signed)
Anesthesia Post Note  Patient: Danielle Knapp  Procedure(s) Performed: RIGHT BREAST LUMPECTOMY WITH RADIOACTIVE SEED LOCALIZATION (Right: Breast)     Patient location during evaluation: PACU Anesthesia Type: General Level of consciousness: awake and alert, oriented and patient cooperative Pain management: pain level controlled Vital Signs Assessment: post-procedure vital signs reviewed and stable Respiratory status: spontaneous breathing, nonlabored ventilation and respiratory function stable Cardiovascular status: blood pressure returned to baseline and stable Postop Assessment: no apparent nausea or vomiting Anesthetic complications: no   No notable events documented.  Last Vitals:  Vitals:   02/16/21 0819 02/16/21 0830  BP: (!) 158/75 132/77  Pulse: 84 76  Resp: 14 16  Temp: 36.5 C   SpO2: 100% 98%    Last Pain:  Vitals:   02/16/21 0819  TempSrc:   PainSc: 0-No pain                 Pervis Hocking

## 2021-02-16 NOTE — Transfer of Care (Signed)
Immediate Anesthesia Transfer of Care Note  Patient: Danielle Knapp  Procedure(s) Performed: RIGHT BREAST LUMPECTOMY WITH RADIOACTIVE SEED LOCALIZATION (Right: Breast)  Patient Location: PACU  Anesthesia Type:General  Level of Consciousness: awake  Airway & Oxygen Therapy: Patient Spontanous Breathing and Patient connected to face mask oxygen  Post-op Assessment: Report given to RN and Post -op Vital signs reviewed and stable  Post vital signs: Reviewed and stable  Last Vitals:  Vitals Value Taken Time  BP 158/75 02/16/21 0819  Temp    Pulse 84 02/16/21 0820  Resp 14 02/16/21 0820  SpO2 100 % 02/16/21 0820  Vitals shown include unvalidated device data.  Last Pain:  Vitals:   02/16/21 0646  TempSrc: Oral  PainSc: 0-No pain         Complications: No notable events documented.

## 2021-02-16 NOTE — Interval H&P Note (Signed)
History and Physical Interval Note:no change in H and P  02/16/2021 7:00 AM  Danielle Knapp  has presented today for surgery, with the diagnosis of DCIS RIGHT BREAST.  The various methods of treatment have been discussed with the patient and family. After consideration of risks, benefits and other options for treatment, the patient has consented to  Procedure(s): RIGHT BREAST LUMPECTOMY WITH RADIOACTIVE SEED LOCALIZATION (Right) as a surgical intervention.  The patient's history has been reviewed, patient examined, no change in status, stable for surgery.  I have reviewed the patient's chart and labs.  Questions were answered to the patient's satisfaction.     Coralie Keens

## 2021-02-16 NOTE — Anesthesia Preprocedure Evaluation (Addendum)
Anesthesia Evaluation  Patient identified by MRN, date of birth, ID band Patient awake    Reviewed: Allergy & Precautions, NPO status , Patient's Chart, lab work & pertinent test results  Airway Mallampati: I  TM Distance: >3 FB Neck ROM: Full    Dental  (+) Teeth Intact, Dental Advisory Given   Pulmonary neg pulmonary ROS,    Pulmonary exam normal breath sounds clear to auscultation       Cardiovascular hypertension, Pt. on medications + CAD  Normal cardiovascular exam Rhythm:Regular Rate:Normal  2019 ED visit w/ admission,  hypertensive urgency;  follow up with dr Doylene Canard--- had outpatient ETT 10-21-2017 positive ischemia, echo 10-16-2017 G1DD, 60-65%;    s/p cardiac cath 11-03-2017 w/ very miminal nonobstructive cad   Neuro/Psych PSYCHIATRIC DISORDERS Anxiety Depression negative neurological ROS     GI/Hepatic Neg liver ROS, GERD  Controlled,  Endo/Other  negative endocrine ROS  Renal/GU negative Renal ROS  negative genitourinary   Musculoskeletal  (+) Arthritis , Osteoarthritis,    Abdominal   Peds negative pediatric ROS (+)  Hematology  (+) Blood dyscrasia, , polycythemia   Anesthesia Other Findings DCIS right breast  Reproductive/Obstetrics negative OB ROS                            Anesthesia Physical Anesthesia Plan  ASA: 2  Anesthesia Plan: General   Post-op Pain Management:    Induction: Intravenous  PONV Risk Score and Plan: 3 and Ondansetron, Dexamethasone, Midazolam and Treatment may vary due to age or medical condition  Airway Management Planned: LMA  Additional Equipment: None  Intra-op Plan:   Post-operative Plan: Extubation in OR  Informed Consent: I have reviewed the patients History and Physical, chart, labs and discussed the procedure including the risks, benefits and alternatives for the proposed anesthesia with the patient or authorized representative who  has indicated his/her understanding and acceptance.     Dental advisory given  Plan Discussed with: CRNA  Anesthesia Plan Comments:         Anesthesia Quick Evaluation

## 2021-02-16 NOTE — Anesthesia Procedure Notes (Signed)
Procedure Name: LMA Insertion Date/Time: 02/16/2021 7:41 AM Performed by: Tawni Millers, CRNA Pre-anesthesia Checklist: Patient identified, Emergency Drugs available, Suction available and Patient being monitored Patient Re-evaluated:Patient Re-evaluated prior to induction Oxygen Delivery Method: Circle system utilized Preoxygenation: Pre-oxygenation with 100% oxygen Induction Type: IV induction Ventilation: Mask ventilation without difficulty LMA: LMA inserted LMA Size: 4.0 Number of attempts: 1 Airway Equipment and Method: Bite block Placement Confirmation: positive ETCO2 Tube secured with: Tape Dental Injury: Teeth and Oropharynx as per pre-operative assessment

## 2021-02-16 NOTE — Discharge Instructions (Addendum)
[facility] Office Phone Number [phone]  BREAST BIOPSY/ PARTIAL MASTECTOMY: POST OP INSTRUCTIONS  Always review your discharge instruction sheet given to you by the facility where your surgery was performed.  IF YOU HAVE DISABILITY OR FAMILY LEAVE FORMS, YOU MUST BRING THEM TO THE OFFICE FOR PROCESSING.  DO NOT GIVE THEM TO YOUR DOCTOR.  A prescription for pain medication may be given to you upon discharge.  Take your pain medication as prescribed, if needed.  If narcotic pain medicine is not needed, then you may take acetaminophen (Tylenol) or ibuprofen (Advil) as needed. Take your usually prescribed medications unless otherwise directed If you need a refill on your pain medication, please contact your pharmacy.  They will contact our office to request authorization.  Prescriptions will not be filled after 5pm or on week-ends. You should eat very light the first 24 hours after surgery, such as soup, crackers, pudding, etc.  Resume your normal diet the day after surgery. Most patients will experience some swelling and bruising in the breast.  Ice packs and a good support bra will help.  Swelling and bruising can take several days to resolve.  It is common to experience some constipation if taking pain medication after surgery.  Increasing fluid intake and taking a stool softener will usually help or prevent this problem from occurring.  A mild laxative (Milk of Magnesia or Miralax) should be taken according to package directions if there are no bowel movements after 48 hours. Unless discharge instructions indicate otherwise, you may remove your bandages 24-48 hours after surgery, and you may shower at that time.  You may have steri-strips (small skin tapes) in place directly over the incision.  These strips should be left on the skin for 7-10 days.  If your surgeon used skin glue on the incision, you may shower in 24 hours.  The glue will flake off over the next 2-3 weeks.  Any  sutures or staples will be removed at the office during your follow-up visit. ACTIVITIES:  You may resume regular daily activities (gradually increasing) beginning the next day.  Wearing a good support bra or sports bra minimizes pain and swelling.  You may have sexual intercourse when it is comfortable. You may drive when you no longer are taking prescription pain medication, you can comfortably wear a seatbelt, and you can safely maneuver your car and apply brakes. RETURN TO WORK:  ______________________________________________________________________________________ Dennis Bast should see your doctor in the office for a follow-up appointment approximately two weeks after your surgery.  Your doctor's nurse will typically make your follow-up appointment when she calls you with your pathology report.  Expect your pathology report 2-3 business days after your surgery.  You may call to check if you do not hear from [place] after three days. OTHER INSTRUCTIONS: OK TO REMOVE THE BINDER AND SHOWER STARING TOMORROW ICE PACK, TYLENOL, AND IBUPROFEN ALSO FOR PAIN NO VIGOROUS ACTIVITY FOR ONE WEEK _______________________________________________________________________________________________ _____________________________________________________________________________________________________________________________________ _____________________________________________________________________________________________________________________________________ _____________________________________________________________________________________________________________________________________  WHEN TO CALL YOUR DOCTOR: Fever over 101.0 Nausea and/or vomiting. Extreme swelling or bruising. Continued bleeding from incision. Increased pain, redness, or drainage from the incision.  The clinic staff is available to answer your questions during regular business hours.  Please don't hesitate to call and ask to speak to one of the  nurses for clinical concerns.  If you have a medical emergency, go to the nearest emergency room or call 911.  A surgeon from [facility] is always on call at the hospital.  For  further questions, please visit [email or web address]    No tylenol until after 12:50pm today.   Post Anesthesia Home Care Instructions  Activity: Get plenty of rest for the remainder of the day. A responsible individual must stay with you for 24 hours following the procedure.  For the next 24 hours, DO NOT: -Drive a car -Paediatric nurse -Drink alcoholic beverages -Take any medication unless instructed by your physician -Make any legal decisions or sign important papers.  Meals: Start with liquid foods such as gelatin or soup. Progress to regular foods as tolerated. Avoid greasy, spicy, heavy foods. If nausea and/or vomiting occur, drink only clear liquids until the nausea and/or vomiting subsides. Call your physician if vomiting continues.  Special Instructions/Symptoms: Your throat may feel dry or sore from the anesthesia or the breathing tube placed in your throat during surgery. If this causes discomfort, gargle with warm salt water. The discomfort should disappear within 24 hours.  If you had a scopolamine patch placed behind your ear for the management of post- operative nausea and/or vomiting:  1. The medication in the patch is effective for 72 hours, after which it should be removed.  Wrap patch in a tissue and discard in the trash. Wash hands thoroughly with soap and water. 2. You may remove the patch earlier than 72 hours if you experience unpleasant side effects which may include dry mouth, dizziness or visual disturbances. 3. Avoid touching the patch. Wash your hands with soap and water after contact with the patch.

## 2021-02-18 ENCOUNTER — Encounter (HOSPITAL_BASED_OUTPATIENT_CLINIC_OR_DEPARTMENT_OTHER): Payer: Self-pay | Admitting: Surgery

## 2021-02-19 ENCOUNTER — Telehealth: Payer: Self-pay | Admitting: *Deleted

## 2021-02-19 NOTE — Telephone Encounter (Signed)
Pt called requesting appt with Vinnie Level, RD Msg sent for appt  Pt will see Dr. Ninfa Linden 4 wks post op and doing well after surgery.

## 2021-02-20 ENCOUNTER — Telehealth: Payer: Self-pay | Admitting: Dietician

## 2021-02-20 NOTE — Telephone Encounter (Signed)
Nutrition   Received voicemail from patient requesting nutrition appointment. Spoke with patient via telephone this morning. Appointment has been scheduled for Tuesday, July 19 in clinic.

## 2021-02-21 ENCOUNTER — Encounter: Payer: Self-pay | Admitting: *Deleted

## 2021-02-21 LAB — SURGICAL PATHOLOGY

## 2021-03-20 ENCOUNTER — Ambulatory Visit
Admission: RE | Admit: 2021-03-20 | Discharge: 2021-03-20 | Disposition: A | Discharge status: Home / Self Care | Payer: 59 | Source: Ambulatory Visit | Attending: Radiation Oncology | Admitting: Radiation Oncology

## 2021-03-20 ENCOUNTER — Other Ambulatory Visit: Payer: Self-pay

## 2021-03-20 ENCOUNTER — Inpatient Hospital Stay: Payer: 59 | Attending: Gynecologic Oncology | Admitting: Dietician

## 2021-03-20 ENCOUNTER — Encounter: Payer: Self-pay | Admitting: Radiation Oncology

## 2021-03-20 VITALS — BP 145/87 | HR 67 | Temp 96.8°F | Resp 18 | Ht 66.0 in | Wt 186.0 lb

## 2021-03-20 DIAGNOSIS — D0511 Intraductal carcinoma in situ of right breast: Secondary | ICD-10-CM

## 2021-03-20 NOTE — Progress Notes (Signed)
Radiation Oncology         [phone] ________________________________  Name: Danielle Knapp        MRN: [id number]  Date of Service: 03/20/2021 DOB: April 05, 1956  CC:Harlan Stains, MD  Magrinat, Virgie Dad, MD     REFERRING PHYSICIAN: Magrinat, Virgie Dad, MD   DIAGNOSIS: The encounter diagnosis was Ductal carcinoma in situ (DCIS) of right breast.   HISTORY OF PRESENT ILLNESS: Danielle Knapp is a 65 y.o. female originally seen in the multidisciplinary breast clinic for a new diagnosis of right breast cancer.  The patient was also just recently diagnosed with a stage I, pT1aN0 grade 2 endometrial carcinoma.  She underwent robotic hysterectomy with bilateral salpingo-oophorectomy and lymph node dissection on 11/09/2020.  She also had a bladder tumor that was biopsied and found to have a low-grade papillary urothelial carcinoma without invasion of the muscularis or lamina propria.  She will be followed expectantly without any adjuvant treatment.  Her recent imaging of the breast for screening purposes showed calcifications and this showed amorphic right upper quadrant measuring 2-3 cm. A stereotactic biopsy revealed a low grade DCIS with calcifications and intraductal papilloma and changes of ADH were also seen. Her cancer was ER/PR positive.   Since her last visit, she has undergone right lumpectomy on 02/16/2021 which revealed low and intermediate grade DCIS with ALH, fibrocystic changes, a CSL and intraductal papilloma. All margins were widely clear. She's seen today to discuss treatment of her cancer.   PREVIOUS RADIATION THERAPY: No   PAST MEDICAL HISTORY:  Past Medical History:  Diagnosis Date   Allergic rhinitis    Allergy    Anxiety    Bladder cancer ([place]) 10/17/2020   Borderline glaucoma, left    no eye drops   BPPV (benign paroxysmal positional vertigo)    Breast cancer ([place]) 01/23/2021   Cancer ([place]) 10/17/2020   Cataract    Chondromalacia of patella    left   Coronary artery  disease    cardiac cath 11-03-2017 done for positive ETT,  showed very miminal nonobstrutive cad, ef 55-65%, non caridac chest pain   Diverticulosis of colon    Endometrial cancer ([place])    Family history of breast cancer 12/08/2020   Family history of ovarian cancer 12/08/2020   Family history of prostate cancer 12/08/2020   Frequency of urination    GERD (gastroesophageal reflux disease)    History of chest pain 10/14/2017   ED visit w/ admission,  hypertensive urgency;  follow up with dr Doylene Canard--- had outpatient ETT 10-21-2017 positive ischemia, echo 10-16-2017 G1DD, 60-65%;    s/p cardiac cath 11-03-2017 w/ very miminal nonobstructive cad , chest pain non cardiac   History of diverticulitis    History of kidney stones    HSV-1 infection    fever blisters   Hx of adenomatous colonic polyps 02/08/2018   Hypertension    followed by pcp   MDD (major depressive disorder)    OA (osteoarthritis)    left knee, shoulder   Osteoporosis    Polycythemia    long hx followed by pcp and  sees hematology/ oncology--- dr Jerilynn Mages. Julien Nordmann on as needed basis, chronic and told to donate blood regularly   Stenosis of cervix    Uterine mass    Wears glasses        PAST SURGICAL HISTORY: Past Surgical History:  Procedure Laterality Date   ABDOMINAL HYSTERECTOMY     BREAST LUMPECTOMY WITH RADIOACTIVE SEED LOCALIZATION Right 02/16/2021  Procedure: RIGHT BREAST LUMPECTOMY WITH RADIOACTIVE SEED LOCALIZATION;  Surgeon: Coralie Keens, MD;  Location: [facility];  Service: General;  Laterality: Right;   CATARACT EXTRACTION W/ INTRAOCULAR LENS  IMPLANT, BILATERAL  2013   COLONOSCOPY  02-02-2018 dr Carlean Purl   CYSTOSCOPY  10/17/2020   Procedure: CYSTOSCOPY;  Surgeon: Nunzio Cobbs, MD;  [facility];  Service: Gynecology;;   CYSTOSCOPY N/A 11/09/2020   Procedure: Consuela Mimes;  Surgeon: Remi Haggard, MD;  Location: [facility];  Service: Urology;  Laterality: N/A;  Dr.  Milford Cage needs to go first. [name] and rigid bx forceps.   DIAGNOSTIC LAPAROSCOPY  1996   DILATATION & CURETTAGE/HYSTEROSCOPY WITH MYOSURE N/A 10/17/2020   Procedure: DILATATION & CURETTAGE/HYSTEROSCOPY WITH  MYOSURE RESECTION OF ENDOMETRIAL MASS,  OPENING OF CERVICAL STENOSIS, DRAINAGE OF UTERINE FLUID;  Surgeon: Nunzio Cobbs, MD;  Location: [facility];  Service: Gynecology;  Laterality: N/A;   EYE SURGERY     FRACTURE SURGERY     HEEL SPUR SURGERY Right 2015   Dr. Durward Fortes   HEMORRHOID SURGERY  2004   LEFT HEART CATH AND CORONARY ANGIOGRAPHY N/A 11/03/2017   Procedure: LEFT HEART CATH AND CORONARY ANGIOGRAPHY;  Surgeon: Jettie Booze, MD;  Location: [facility];  Service: Cardiovascular;  Laterality: N/A;   LYMPH NODE DISSECTION N/A 11/09/2020   Procedure: BILATERAL LYMPH NODE DISSECTION;MINI LAPAROTOMY;  Surgeon: Lafonda Mosses, MD;  Location: [facility];  Service: Gynecology;  Laterality: N/A;   OPERATIVE ULTRASOUND N/A 10/17/2020   Procedure: OPERATIVE ULTRASOUND;  Surgeon: Nunzio Cobbs, MD;  Location: [facility];  Service: Gynecology;  Laterality: N/A;   ROBOTIC ASSISTED TOTAL HYSTERECTOMY WITH BILATERAL SALPINGO OOPHERECTOMY Bilateral 11/09/2020   Procedure: XI ROBOTIC ASSISTED TOTAL HYSTERECTOMY WITH BILATERAL SALPINGO OOPHORECTOMY,;  Surgeon: Lafonda Mosses, MD;  Location: [facility];  Service: Gynecology;  Laterality: Bilateral;   SEPTOPLASTY  x2 1970's   TONSILLECTOMY  age 2   TRANSURETHRAL RESECTION OF BLADDER TUMOR N/A 11/09/2020   Procedure: TRANSURETHRAL RESECTION OF BLADDER TUMOR (TURBT);  Surgeon: Remi Haggard, MD;  Location: [facility];  Service: Urology;  Laterality: N/A;     FAMILY HISTORY:  Family History  Problem Relation Age of Onset   Osteoporosis Mother    Heart attack Mother    Alzheimer's disease Mother    Arthritis Mother    Depression Mother    Diabetes Father    Hypertension Father     Heart failure Father    Alcohol abuse Father    COPD Father    Ovarian cancer Sister 23   Stroke Maternal Grandmother    Prostate cancer Maternal Grandfather 59       metastatic   Cancer Maternal Grandfather    Breast cancer Other        MGM's sister; dx after 18   Vision loss Paternal Grandmother    Colon cancer Neg Hx    Colon polyps Neg Hx    Esophageal cancer Neg Hx    Rectal cancer Neg Hx    Stomach cancer Neg Hx    Endometrial cancer Neg Hx    Pancreatic cancer Neg Hx      SOCIAL HISTORY:  reports that she has never smoked. She has never used smokeless tobacco. She reports current alcohol use of about 2.0 standard drinks of alcohol per week. She reports that she does not use drugs.  The patient is married  and lives in [place].  She works for a [employer] in [place].    ALLERGIES: Ciprofloxacin hcl, Erythromycin, Metronidazole, Tolmetin, Lisinopril, and Eszopiclone   MEDICATIONS:  Current Outpatient Medications  Medication Sig Dispense Refill   amLODipine (NORVASC) 2.5 MG tablet Take 2.5 mg by mouth daily.  1   buPROPion (WELLBUTRIN XL) 300 MG 24 hr tablet Take 300 mg by mouth daily.     Calcium Carb-Cholecalciferol (CALCIUM 500+D3 PO) Take 1 tablet by mouth every evening.     fluticasone (FLONASE) 50 MCG/ACT nasal spray Place 2 sprays into both nostrils daily.     loratadine (CLARITIN) 10 MG tablet Take 10 mg by mouth daily.     losartan (COZAAR) 100 MG tablet Take 100 mg by mouth daily.     Multiple Vitamin (MULTIVITAMIN WITH MINERALS) TABS tablet Take 1 tablet by mouth every evening.     sertraline (ZOLOFT) 100 MG tablet Take 150 mg by mouth daily.      traMADol (ULTRAM) 50 MG tablet Take 1 tablet (50 mg total) by mouth every 6 (six) hours as needed for moderate pain or severe pain. 20 tablet 0   valACYclovir (VALTREX) 1000 MG tablet Take 2,000 mg by mouth 2 (two) times daily as needed (for fever blisters).      zolpidem (AMBIEN) 10 MG tablet Take 5  mg by mouth at bedtime.     No current facility-administered medications for this encounter.     REVIEW OF SYSTEMS: On review of systems, the patient reports that she is doing well overall. She denies any breast concerns at this time but did wear a compression bra for the last 4 weeks. She denies pain in her breast at this time.   PHYSICAL EXAM:  Wt Readings from Last 3 Encounters:  02/16/21 184 lb 1.4 oz (83.5 kg)  01/31/21 186 lb 6.4 oz (84.6 kg)  11/27/20 185 lb 4.8 oz (84.1 kg)   Temp Readings from Last 3 Encounters:  02/16/21 98.5 F (36.9 C)  01/31/21 97.7 F (36.5 C)  11/27/20 (!) 97.5 F (36.4 C) (Tympanic)   BP Readings from Last 3 Encounters:  02/16/21 (!) 147/69  01/31/21 (!) 143/77  11/27/20 (!) 156/75   Pulse Readings from Last 3 Encounters:  02/16/21 72  01/31/21 67  11/27/20 72    In general this is a well appearing Caucasian female in no acute distress. She's alert and oriented x4 and appropriate throughout the examination. Cardiopulmonary assessment is negative for acute distress and she exhibits normal effort. Her right breast incision is well healed without erythema, drainage or separation. She has multiple seborrheic keratoses throughout the trunk.   ECOG = 0  0 - Asymptomatic (Fully active, able to carry on all predisease activities without restriction)  1 - Symptomatic but completely ambulatory (Restricted in physically strenuous activity but ambulatory and able to carry out work of a light or sedentary nature. For example, light housework, office work)  2 - Symptomatic, <50% in bed during the day (Ambulatory and capable of all self care but unable to carry out any work activities. Up and about more than 50% of waking hours)  3 - Symptomatic, >50% in bed, but not bedbound (Capable of only limited self-care, confined to bed or chair 50% or more of waking hours)  4 - Bedbound (Completely disabled. Cannot carry on any self-care. Totally confined to bed  or chair)  5 - Death   Eustace Pen MM, Creech RH, Tormey DC, et al. [phone]). "  Toxicity and response criteria of the [facility]". [place] Oncol. 5 (6): 649-55    LABORATORY DATA:  Lab Results  Component Value Date   WBC 6.8 01/31/2021   HGB 16.2 (H) 01/31/2021   HCT 43.3 01/31/2021   MCV 85.4 01/31/2021   PLT 263 01/31/2021   Lab Results  Component Value Date   NA 142 01/31/2021   K 4.3 01/31/2021   CL 105 01/31/2021   CO2 26 01/31/2021   Lab Results  Component Value Date   ALT 26 01/31/2021   AST 22 01/31/2021   ALKPHOS 69 01/31/2021   BILITOT 0.8 01/31/2021      RADIOGRAPHY: No results found.     IMPRESSION/PLAN: 1. Low to intermediate grade, ER/PR positive DCIS of the right breast. Dr. Lisbeth Renshaw discusses the final pathology findings and reviews the nature of early noninvasive breast disease. She has done well since surgery and is healing well. Dr. Lisbeth Renshaw reviews the rationale to proceed with adjuvant radiotherapy to reduce risks of local recurrence. We discussed the risks, benefits, short, and long term effects of radiotherapy, as well as the curative intent, and the patient is interested in proceeding. Dr. Lisbeth Renshaw discusses the delivery and logistics of radiotherapy and anticipates a course of 4 weeks of radiotherapy to the right breast. She will simulate tomorrow and finish treatment prior to a previously scheduled vacation.   In a visit lasting 45 minutes, greater than 50% of the time was spent face to face reviewing her case, as well as in preparation of, discussing, and coordinating the patient's care.  The above documentation reflects my direct findings during this shared patient visit. Please see the separate note by Dr. Lisbeth Renshaw on this date for the remainder of the patient's plan of care.    Carola Rhine, [facility]    **Disclaimer: This note was dictated with voice recognition software. Similar sounding words can inadvertently be transcribed and  this note may contain transcription errors which may not have been corrected upon publication of note.**

## 2021-03-20 NOTE — Progress Notes (Signed)
Patient denies having a Psychologist, forensic. States that she is not pregnant Vitals:   03/20/21 1006  BP: (!) 145/87  Pulse: 67  Resp: 18  Temp: (!) 96.8 F (36 C)  TempSrc: Temporal  SpO2: 100%  Weight: 84.4 kg  Height: 5\' 6"  (1.676 m)   .

## 2021-03-20 NOTE — Progress Notes (Signed)
Nutrition Assessment   Reason for Assessment: Patient request   ASSESSMENT: 65 year old female with DCIS of right breast. She is s/p lumpectomy on 6/17. Plans for adjuvant radiation followed by anti-estrogen therapy x 5 years.   Past medical history of newly diagnosed endometrial and bladder cancer s/p total abdominal hysterectomy with bilateral salpingo-oophorectomy 11/09/20, CAD, GERD, MDD, osteoporosis, polycythemia  Met with patient in clinic. She reports overall doing well and has recovered from lumpectomy, says surgical incision is tender. Patient reports her appetite is "too good" and tends to "stress eat" Patient states being diagnosed with 3 cancers within a year has been a lot for her to handle. She is hoping radiation therapy can be delayed until after her trip to the [facility] in September. Patient reports she enjoys hiking, gardening, and walking which she plans to do more of now that she has been cleared by her surgeon. Patient shares that her husband completed treatment for prostate cancer last year and is diabetic. She reports he eats a healthy diet and she has been trying to incorporate more healthy foods into her diet. She usually has 2 poptarts for breakfast, will drink glass of skim milk, diet green tea, or diet coke, snacks on raw carrots, cauliflower, drinks strawberry protein shake from [facility] for lunch, eats variety of foods for dinner. Last night, she had leftover spaghetti made with ground [place] and whole wheat pasta. Patient chooses lean proteins and limits intake of red meats. Patient reports she really likes chocolate and snacks frequently on chips and cookies throughout the day.   Medications: reviewed  Labs: reviewed   Anthropometrics:   Height: 5'6" Weight: 184 lb 1.4 oz (6/14) UBW: 180-185 lb  BMI: 29.71    NUTRITION DIAGNOSIS: Food and nutrition related knowledge deficit related to cancer and associated treatments as evidenced by patient request for dietary  recommendations to reduce risk of recurrence and maintain healthy weight   INTERVENTION: Educated on the importance of adequate intake of calories and protein energy to support continued post operative healing and upcoming radiation therapy Educated on evidenced based nutrition recommendations for plant-based diet, handout provided Discussed ways to increase fruit and vegetable intake High protein snack ideas Encouraged mindful/intuitive eating Encouraged increased activity as able Offered ideas on way to alter taste of water to increase daily intake Educated on [employer] support services available, handouts and upcoming events schedule given Contact information provided   MONITORING, EVALUATION, GOAL: Patient demonstrates understanding of plant-based dietary recommendations to reduce risk of cancer recurrence   Next Visit: No follow-up scheduled at this time, patient has contact information and encouraged to contact with additional questions/concerns

## 2021-03-21 ENCOUNTER — Ambulatory Visit
Admission: RE | Admit: 2021-03-21 | Discharge: 2021-03-21 | Disposition: A | Discharge status: Home / Self Care | Payer: 59 | Source: Ambulatory Visit | Attending: Radiation Oncology | Admitting: Radiation Oncology

## 2021-03-21 DIAGNOSIS — Z510 Encounter for antineoplastic radiation therapy: Secondary | ICD-10-CM | POA: Diagnosis not present

## 2021-03-21 DIAGNOSIS — D0511 Intraductal carcinoma in situ of right breast: Secondary | ICD-10-CM | POA: Insufficient documentation

## 2021-03-22 ENCOUNTER — Ambulatory Visit: Payer: 59 | Admitting: Radiation Oncology

## 2021-03-23 DIAGNOSIS — Z510 Encounter for antineoplastic radiation therapy: Secondary | ICD-10-CM | POA: Diagnosis not present

## 2021-03-28 ENCOUNTER — Encounter: Payer: Self-pay | Admitting: *Deleted

## 2021-03-28 ENCOUNTER — Ambulatory Visit: Payer: 59 | Admitting: Radiation Oncology

## 2021-03-29 ENCOUNTER — Ambulatory Visit: Payer: 59

## 2021-03-29 ENCOUNTER — Ambulatory Visit
Admission: RE | Admit: 2021-03-29 | Discharge: 2021-03-29 | Disposition: A | Discharge status: Home / Self Care | Payer: 59 | Source: Ambulatory Visit | Attending: Radiation Oncology | Admitting: Radiation Oncology

## 2021-03-29 ENCOUNTER — Other Ambulatory Visit: Payer: Self-pay

## 2021-03-29 DIAGNOSIS — Z510 Encounter for antineoplastic radiation therapy: Secondary | ICD-10-CM | POA: Diagnosis not present

## 2021-03-29 NOTE — Progress Notes (Signed)
Pt here for patient teaching.  Pt given Radiation and You booklet, skin care instructions, Alra deodorant, and Radiaplex gel.  Reviewed areas of pertinence such as fatigue, hair loss, skin changes, breast tenderness, and breast swelling . Pt able to give teach back of to pat skin and use unscented/gentle soap,apply Radiaplex bid, avoid applying anything to skin within 4 hours of treatment, avoid wearing an under wire bra, and to use an electric razor if they must shave. Pt verbalizes understanding of information given and will contact nursing with any questions or concerns.     Http://rtanswers.org/treatmentinformation/whattoexpect/index  Gaynel Schaafsma M. Zavia Pullen RN, BSN       

## 2021-03-30 ENCOUNTER — Ambulatory Visit
Admission: RE | Admit: 2021-03-30 | Discharge: 2021-03-30 | Disposition: A | Discharge status: Home / Self Care | Payer: 59 | Source: Ambulatory Visit | Attending: Radiation Oncology | Admitting: Radiation Oncology

## 2021-03-30 DIAGNOSIS — Z510 Encounter for antineoplastic radiation therapy: Secondary | ICD-10-CM | POA: Diagnosis not present

## 2021-03-30 DIAGNOSIS — D0511 Intraductal carcinoma in situ of right breast: Secondary | ICD-10-CM

## 2021-03-30 MED ORDER — RADIAPLEXRX EX GEL
Freq: Once | CUTANEOUS | Status: AC
Start: 2021-03-30 — End: 2021-03-30

## 2021-03-30 MED ORDER — SONAFINE EX EMUL
1.0000 | Freq: Once | CUTANEOUS | Status: DC
Start: 2021-03-30 — End: 2021-03-31

## 2021-03-30 MED ORDER — ALRA NON-METALLIC DEODORANT (RAD-ONC)
1.0000 | Freq: Once | TOPICAL | Status: AC
Start: 2021-03-30 — End: 2021-03-30
  Administered 2021-03-30: 1 via TOPICAL

## 2021-04-02 ENCOUNTER — Ambulatory Visit
Admission: RE | Admit: 2021-04-02 | Discharge: 2021-04-02 | Disposition: A | Discharge status: Home / Self Care | Payer: 59 | Source: Ambulatory Visit | Attending: Radiation Oncology | Admitting: Radiation Oncology

## 2021-04-02 ENCOUNTER — Other Ambulatory Visit: Payer: Self-pay

## 2021-04-02 DIAGNOSIS — Z510 Encounter for antineoplastic radiation therapy: Secondary | ICD-10-CM | POA: Diagnosis not present

## 2021-04-02 DIAGNOSIS — D0511 Intraductal carcinoma in situ of right breast: Secondary | ICD-10-CM | POA: Insufficient documentation

## 2021-04-03 ENCOUNTER — Ambulatory Visit
Admission: RE | Admit: 2021-04-03 | Discharge: 2021-04-03 | Disposition: A | Discharge status: Home / Self Care | Payer: 59 | Source: Ambulatory Visit | Attending: Radiation Oncology | Admitting: Radiation Oncology

## 2021-04-03 DIAGNOSIS — Z510 Encounter for antineoplastic radiation therapy: Secondary | ICD-10-CM | POA: Diagnosis not present

## 2021-04-04 ENCOUNTER — Ambulatory Visit
Admission: RE | Admit: 2021-04-04 | Discharge: 2021-04-04 | Disposition: A | Discharge status: Home / Self Care | Payer: 59 | Source: Ambulatory Visit | Attending: Radiation Oncology | Admitting: Radiation Oncology

## 2021-04-04 ENCOUNTER — Other Ambulatory Visit: Payer: Self-pay

## 2021-04-04 DIAGNOSIS — Z510 Encounter for antineoplastic radiation therapy: Secondary | ICD-10-CM | POA: Diagnosis not present

## 2021-04-05 ENCOUNTER — Ambulatory Visit
Admission: RE | Admit: 2021-04-05 | Discharge: 2021-04-05 | Disposition: A | Discharge status: Home / Self Care | Payer: 59 | Source: Ambulatory Visit | Attending: Radiation Oncology | Admitting: Radiation Oncology

## 2021-04-05 DIAGNOSIS — Z510 Encounter for antineoplastic radiation therapy: Secondary | ICD-10-CM | POA: Diagnosis not present

## 2021-04-06 ENCOUNTER — Ambulatory Visit
Admission: RE | Admit: 2021-04-06 | Discharge: 2021-04-06 | Disposition: A | Discharge status: Home / Self Care | Payer: 59 | Source: Ambulatory Visit | Attending: Radiation Oncology | Admitting: Radiation Oncology

## 2021-04-06 ENCOUNTER — Other Ambulatory Visit: Payer: Self-pay

## 2021-04-06 DIAGNOSIS — Z510 Encounter for antineoplastic radiation therapy: Secondary | ICD-10-CM | POA: Diagnosis not present

## 2021-04-09 ENCOUNTER — Ambulatory Visit
Admission: RE | Admit: 2021-04-09 | Discharge: 2021-04-09 | Disposition: A | Discharge status: Home / Self Care | Payer: 59 | Source: Ambulatory Visit | Attending: Radiation Oncology | Admitting: Radiation Oncology

## 2021-04-09 ENCOUNTER — Other Ambulatory Visit: Payer: Self-pay

## 2021-04-09 DIAGNOSIS — Z510 Encounter for antineoplastic radiation therapy: Secondary | ICD-10-CM | POA: Diagnosis not present

## 2021-04-10 ENCOUNTER — Ambulatory Visit
Admission: RE | Admit: 2021-04-10 | Discharge: 2021-04-10 | Disposition: A | Discharge status: Home / Self Care | Payer: 59 | Source: Ambulatory Visit | Attending: Radiation Oncology | Admitting: Radiation Oncology

## 2021-04-10 DIAGNOSIS — Z510 Encounter for antineoplastic radiation therapy: Secondary | ICD-10-CM | POA: Diagnosis not present

## 2021-04-11 ENCOUNTER — Encounter: Payer: Self-pay | Admitting: Radiation Oncology

## 2021-04-11 ENCOUNTER — Other Ambulatory Visit: Payer: Self-pay

## 2021-04-11 ENCOUNTER — Ambulatory Visit
Admission: RE | Admit: 2021-04-11 | Discharge: 2021-04-11 | Disposition: A | Discharge status: Home / Self Care | Payer: 59 | Source: Ambulatory Visit | Attending: Radiation Oncology | Admitting: Radiation Oncology

## 2021-04-11 DIAGNOSIS — Z510 Encounter for antineoplastic radiation therapy: Secondary | ICD-10-CM | POA: Diagnosis not present

## 2021-04-12 ENCOUNTER — Ambulatory Visit
Admission: RE | Admit: 2021-04-12 | Discharge: 2021-04-12 | Disposition: A | Discharge status: Home / Self Care | Payer: 59 | Source: Ambulatory Visit | Attending: Radiation Oncology | Admitting: Radiation Oncology

## 2021-04-12 DIAGNOSIS — Z510 Encounter for antineoplastic radiation therapy: Secondary | ICD-10-CM | POA: Diagnosis not present

## 2021-04-13 ENCOUNTER — Ambulatory Visit
Admission: RE | Admit: 2021-04-13 | Discharge: 2021-04-13 | Disposition: A | Discharge status: Home / Self Care | Payer: 59 | Source: Ambulatory Visit | Attending: Radiation Oncology | Admitting: Radiation Oncology

## 2021-04-13 ENCOUNTER — Ambulatory Visit: Payer: 59 | Admitting: Radiation Oncology

## 2021-04-13 ENCOUNTER — Other Ambulatory Visit: Payer: Self-pay

## 2021-04-13 DIAGNOSIS — Z510 Encounter for antineoplastic radiation therapy: Secondary | ICD-10-CM | POA: Diagnosis not present

## 2021-04-16 ENCOUNTER — Other Ambulatory Visit: Payer: Self-pay

## 2021-04-16 ENCOUNTER — Ambulatory Visit
Admission: RE | Admit: 2021-04-16 | Discharge: 2021-04-16 | Disposition: A | Discharge status: Home / Self Care | Payer: 59 | Source: Ambulatory Visit | Attending: Radiation Oncology | Admitting: Radiation Oncology

## 2021-04-16 DIAGNOSIS — Z510 Encounter for antineoplastic radiation therapy: Secondary | ICD-10-CM | POA: Diagnosis not present

## 2021-04-17 ENCOUNTER — Ambulatory Visit
Admission: RE | Admit: 2021-04-17 | Discharge: 2021-04-17 | Disposition: A | Discharge status: Home / Self Care | Payer: 59 | Source: Ambulatory Visit | Attending: Radiation Oncology | Admitting: Radiation Oncology

## 2021-04-17 DIAGNOSIS — Z510 Encounter for antineoplastic radiation therapy: Secondary | ICD-10-CM | POA: Diagnosis not present

## 2021-04-18 ENCOUNTER — Other Ambulatory Visit: Payer: Self-pay

## 2021-04-18 ENCOUNTER — Ambulatory Visit
Admission: RE | Admit: 2021-04-18 | Discharge: 2021-04-18 | Disposition: A | Discharge status: Home / Self Care | Payer: 59 | Source: Ambulatory Visit | Attending: Radiation Oncology | Admitting: Radiation Oncology

## 2021-04-18 DIAGNOSIS — Z510 Encounter for antineoplastic radiation therapy: Secondary | ICD-10-CM | POA: Diagnosis not present

## 2021-04-19 ENCOUNTER — Ambulatory Visit
Admission: RE | Admit: 2021-04-19 | Discharge: 2021-04-19 | Disposition: A | Discharge status: Home / Self Care | Payer: 59 | Source: Ambulatory Visit | Attending: Radiation Oncology | Admitting: Radiation Oncology

## 2021-04-19 DIAGNOSIS — Z510 Encounter for antineoplastic radiation therapy: Secondary | ICD-10-CM | POA: Diagnosis not present

## 2021-04-20 ENCOUNTER — Other Ambulatory Visit: Payer: Self-pay

## 2021-04-20 ENCOUNTER — Ambulatory Visit
Admission: RE | Admit: 2021-04-20 | Discharge: 2021-04-20 | Disposition: A | Discharge status: Home / Self Care | Payer: 59 | Source: Ambulatory Visit | Attending: Radiation Oncology | Admitting: Radiation Oncology

## 2021-04-20 DIAGNOSIS — Z510 Encounter for antineoplastic radiation therapy: Secondary | ICD-10-CM | POA: Diagnosis not present

## 2021-04-23 ENCOUNTER — Other Ambulatory Visit: Payer: Self-pay

## 2021-04-23 ENCOUNTER — Ambulatory Visit
Admission: RE | Admit: 2021-04-23 | Discharge: 2021-04-23 | Disposition: A | Discharge status: Home / Self Care | Payer: 59 | Source: Ambulatory Visit | Attending: Radiation Oncology | Admitting: Radiation Oncology

## 2021-04-23 DIAGNOSIS — Z510 Encounter for antineoplastic radiation therapy: Secondary | ICD-10-CM | POA: Diagnosis not present

## 2021-04-24 ENCOUNTER — Ambulatory Visit
Admission: RE | Admit: 2021-04-24 | Discharge: 2021-04-24 | Disposition: A | Discharge status: Home / Self Care | Payer: 59 | Source: Ambulatory Visit | Attending: Radiation Oncology | Admitting: Radiation Oncology

## 2021-04-24 ENCOUNTER — Encounter: Payer: Self-pay | Admitting: *Deleted

## 2021-04-24 DIAGNOSIS — D0511 Intraductal carcinoma in situ of right breast: Secondary | ICD-10-CM

## 2021-04-24 DIAGNOSIS — Z510 Encounter for antineoplastic radiation therapy: Secondary | ICD-10-CM | POA: Diagnosis not present

## 2021-04-25 ENCOUNTER — Encounter: Payer: Self-pay | Admitting: Radiation Oncology

## 2021-04-25 ENCOUNTER — Ambulatory Visit
Admission: RE | Admit: 2021-04-25 | Discharge: 2021-04-25 | Disposition: A | Discharge status: Home / Self Care | Payer: 59 | Source: Ambulatory Visit | Attending: Radiation Oncology | Admitting: Radiation Oncology

## 2021-04-25 ENCOUNTER — Other Ambulatory Visit: Payer: Self-pay

## 2021-04-25 DIAGNOSIS — Z510 Encounter for antineoplastic radiation therapy: Secondary | ICD-10-CM | POA: Diagnosis not present

## 2021-04-25 NOTE — Progress Notes (Signed)
                                                                                                                                                             Patient Name: LARRISSA REIFSCHNEIDER MRN: [id number] DOB: 1956-04-28 Referring Physician: Harlan Stains (Profile Not Attached) Date of Service: 04/25/2021 [facility], [place]                                                        End Of Treatment Note  Diagnoses: D05.11-Intraductal carcinoma in situ of right breast  Cancer Staging:   Low to intermediate grade, ER/PR positive DCIS of the right breast  Intent: Curative  Radiation Treatment Dates: 03/29/2021 through 04/25/2021 Site Technique Total Dose (Gy) Dose per Fx (Gy) Completed Fx Beam Energies  Breast, Right: Breast_Rt 3D 42.56/42.56 2.66 16/16 10X, 6XFFF  Breast, Right: Breast_Rt_Bst 3D 8/8 2 4/4 6X, 10X   Narrative: The patient tolerated radiation therapy relatively well. She developed fatigue and anticipated skin changes in the treatment field.  Plan: The patient will receive a call in about one month from the radiation oncology department. She will continue follow up with Dr. Jana Hakim as well.  ________________________________________________    Carola Rhine, [facility]

## 2021-04-26 NOTE — Progress Notes (Signed)
  Radiation Oncology         [phone] ________________________________  Name: Danielle Knapp MRN: [id number]  Date: 04/11/2021  DOB: 1956/04/22  SIMULATION NOTE   NARRATIVE:  The patient underwent simulation today for ongoing radiation therapy.  The existing CT study set was employed for the purpose of virtual treatment planning.  The target and avoidance structures were reviewed and modified as necessary.  Treatment planning then occurred.  The radiation boost prescription was entered and confirmed.  A total of 3 complex treatment devices were fabricated in the form of multi-leaf collimators to shape radiation around the targets while maximally excluding nearby normal structures. I have requested : Isodose Plan.    PLAN:  This modified radiation beam arrangement is intended to continue the current radiation dose to an additional 8 Gy in 4 fractions for a total cumulative dose of 50.56 Gy.    ------------------------------------------------  Jodelle Gross, MD, PhD

## 2021-04-30 ENCOUNTER — Other Ambulatory Visit: Payer: Self-pay

## 2021-04-30 ENCOUNTER — Ambulatory Visit
Admission: RE | Admit: 2021-04-30 | Discharge: 2021-04-30 | Disposition: A | Discharge status: Home / Self Care | Payer: 59 | Source: Ambulatory Visit | Attending: Radiation Oncology | Admitting: Radiation Oncology

## 2021-04-30 VITALS — BP 147/83 | HR 73 | Temp 98.0°F | Resp 18 | Ht 66.0 in | Wt 186.0 lb

## 2021-04-30 DIAGNOSIS — D0511 Intraductal carcinoma in situ of right breast: Secondary | ICD-10-CM

## 2021-04-30 NOTE — Progress Notes (Signed)
  Radiation Oncology         [phone] ________________________________  Name: Danielle Knapp MRN: [id number]  Date of Service: 04/30/2021  DOB: 1956/04/21  Post Treatment Telephone Note  Diagnosis:    Low to intermediate grade, ER/PR positive DCIS of the right breast  Interval Since Last Radiation:  1 week   03/29/2021 through 04/25/2021 Site Technique Total Dose (Gy) Dose per Fx (Gy) Completed Fx Beam Energies  Breast, Right: Breast_Rt 3D 42.56/42.56 2.66 16/16 10X, 6XFFF  Breast, Right: Breast_Rt_Bst 3D 8/8 2 4/4 6X, 10X    Narrative:  The patient was contacted today for routine follow-up. During treatment she did very well with radiotherapy and did not have significant desquamation. She reports she has had warmth, redness, and significant soreness and itching of her skin. She's seen today to evaluate this.  On exam she has stable vitals and has erythema and dry dermatitis without desquamation of the right breast and inframammary fold. There are several seborrheic keratoses along the inframammary fold as well.    Impression/Plan: 1.  Low to intermediate grade, ER/PR positive DCIS of the right breast. The patient has had erythema, warmth, and pruritis. We discussed using hydrocortisone 1% cream, alternating with dermoplast spray for comfort, and aloe vera as well as needed and that she can do some cool compresses. She was advised to continue wearing her current soft shell bra as well. We will follow up with her later this week.       Carola Rhine, PAC

## 2021-05-02 ENCOUNTER — Telehealth: Payer: Self-pay | Admitting: *Deleted

## 2021-05-02 NOTE — Telephone Encounter (Signed)
Spoke with the patient and canceled appt for 9/2, explained that the office will call back next week to reschedule

## 2021-05-04 ENCOUNTER — Telehealth: Payer: Self-pay | Admitting: *Deleted

## 2021-05-04 ENCOUNTER — Telehealth: Payer: Self-pay | Admitting: Radiation Oncology

## 2021-05-04 ENCOUNTER — Ambulatory Visit: Payer: 59 | Admitting: Gynecologic Oncology

## 2021-05-04 NOTE — Telephone Encounter (Signed)
Called and rescheduled the patient's appt

## 2021-05-04 NOTE — Telephone Encounter (Signed)
The patient has noticed improvement in her skin and will keep [place] informed of questions or concerns prior to her one month follow up.

## 2021-05-09 ENCOUNTER — Other Ambulatory Visit: Payer: Self-pay

## 2021-05-09 ENCOUNTER — Encounter: Payer: Self-pay | Admitting: Gynecologic Oncology

## 2021-05-09 ENCOUNTER — Inpatient Hospital Stay: Payer: 59 | Attending: Gynecologic Oncology | Admitting: Gynecologic Oncology

## 2021-05-09 VITALS — BP 125/75 | HR 66 | Temp 98.5°F | Resp 16 | Ht 66.0 in | Wt 184.7 lb

## 2021-05-09 DIAGNOSIS — Z90710 Acquired absence of both cervix and uterus: Secondary | ICD-10-CM | POA: Insufficient documentation

## 2021-05-09 DIAGNOSIS — Z90722 Acquired absence of ovaries, bilateral: Secondary | ICD-10-CM | POA: Insufficient documentation

## 2021-05-09 DIAGNOSIS — Z923 Personal history of irradiation: Secondary | ICD-10-CM | POA: Diagnosis not present

## 2021-05-09 DIAGNOSIS — Z79899 Other long term (current) drug therapy: Secondary | ICD-10-CM | POA: Insufficient documentation

## 2021-05-09 DIAGNOSIS — Z8542 Personal history of malignant neoplasm of other parts of uterus: Secondary | ICD-10-CM | POA: Insufficient documentation

## 2021-05-09 DIAGNOSIS — C679 Malignant neoplasm of bladder, unspecified: Secondary | ICD-10-CM | POA: Diagnosis not present

## 2021-05-09 DIAGNOSIS — D398 Neoplasm of uncertain behavior of other specified female genital organs: Secondary | ICD-10-CM | POA: Diagnosis not present

## 2021-05-09 DIAGNOSIS — C541 Malignant neoplasm of endometrium: Secondary | ICD-10-CM

## 2021-05-09 NOTE — Progress Notes (Signed)
Gynecologic Oncology Return Clinic Visit  05/09/21  Reason for Visit: surveillance visit in the setting of early-stage low-risk endometrial cancer  Treatment History: Oncology History  Endometrial cancer [facility])   Initial Diagnosis   Endometrial cancer ([place])   09/27/2019 Imaging   CT A/P: 1. Large hypoenhancing mass in the region of the lower uterus/upper cervix. This appears to expand the endometrial canal the lower uterine segment although the endometrial canal in the uterine fundus is nondilated. As such, endometrial canal/cervical stricture is not considered likely. Rather, imaging features are highly concerning for neoplasm, likely of endometrial origin. 2. No findings of metastatic disease. Specifically, no evidence for lymphadenopathy in the abdomen/pelvis. No peritoneal or omental nodularity. No ascites. 3. Left colonic diverticulosis without diverticulitis   10/09/2020 Imaging   MRI pelvis: Distended endocervical canal, as described above, favoring a lower cervical stenosis/stricture.   Mild associated polypoid soft tissue/debris in the endometrial cavity and along the lower cervix, poorly evaluated on unenhanced MR. While the endometrial soft tissue favors debris, consider hysteroscopy to exclude a cervical mass resulting in Stenosis/stricture.   10/17/2020 Initial Biopsy   EMB: A. ENDOMETRIUM MASS, RESECTION:  - Endometrioid adenocarcinoma.  - See comment.   B. ENDOMETRIUM, CURETTAGE:  - Blood and scanty benign endocervical mucosa.  - No endometrial tissue identified.   COMMENT:   A. The findings are consistent with FIGO grade 1/2.   11/09/2020 Imaging   Cystoscopy and biopsy with fulguration of papillary lesions (Dr. Milford Cage), Robotic-assisted laparoscopic total hysterectomy with bilateral salpingoophorectomy, bilateral pelvic LND, mini-lap for specimen removal  Findings: On EUA, cervix difficult to distinguish, flush with apex; uterus small and mobile. Blood  and some tumor noted on D&C. On intra-abdominal entry, normal upper abdominal exam including liver edge, diaphragm, stomach, small and large bowel, omentum. Bilateral ovaries atrophic appearing. Uterus 6-8cm and normal appearing. Mapping unsuccessful to bilateral pelvic basins. Significant retroperitoneal fibrosis noted and significant adhesions between the bladder and cervix. Several mildly prominent pelvic lymph nodes, no frank tumor involvement. No intra-abdominal or pelvic disease noted at the end of surgery.   11/09/2020 Pathology Results   A. BLADDER TUMOR, BIOPSY:  - Low grade papillary urothelial carcinoma.  - No distinct lamina propria invasion identified.  - Muscularis propria is present and not involved.   B. CERVIX, RIGHT POSTERIOR, BIOPSY:  - Fibromuscular tissue devoid of epithelium, negative for carcinoma.   C. CERVIX, LEFT POSTERIOR, BIOPSY:  - Fibromuscular tissue with endocervical glandular type epithelium,  negative for carcinoma.   D. LYMPH NODES, RIGHT PELVIC, EXCISION:  - Five lymph nodes, negative for carcinoma (0/5).   E. LYMPH NODES, LEFT PELVIC, EXCISION:  - Four lymph nodes, negative for carcinoma (0/4).   F. UTERUS, CERVIX AND BILATERAL FALLOPIAN TUBES AND OVARIES, TOTAL  HYSTERECTOMY AND BILATERAL SALPINGO-OOPHORECTOMY:  - Endometrioid carcinoma, FIGO grade 2, with invasion less than half the  myometrium.  - No involvement of uterine serosa, cervical stroma or adnexa.  - See oncology table.   ONCOLOGY TABLE:   UTERUS, CARCINOMA OR CARCINOSARCOMA: Resection   Procedure: Total hysterectomy and bilateral salpingo-oophorectomy,  Excision of pelvic lymph nodes  Histologic Type: Endometrioid carcinoma  Histologic Grade: FIGO grade 2  Myometrial Invasion:       Depth of Myometrial Invasion: 2 mm       Myometrial Thickness: 7 mm       Percentage of Myometrial Invasion: < 50%  Uterine Serosa Involvement: Not identified  Cervical Stroma Involvement: Not  identified  Other Tissue/Organ Involvement:  Not identified  Peritoneal/Ascitic Fluid: Not submitted/unknown  Lymphovascular Invasion: Not identified  Regional Lymph Nodes:       Pelvic Lymph Nodes Examined:            0 Sentinel            9 Non-sentinel            9 Total       Pelvic Lymph Nodes with Metastasis: 0        Para-aortic Lymph Nodes Examined:            0 Sentinel            0 Non-sentinel            0 Total  Distant Metastasis:       Distant Site(s) Involved: Not applicable  Pathologic Stage Classification (pTNM, AJCC 8th Edition): pT1a, pN0  Ancillary Studies: MMR/MSI testing will be ordered  Representative Tumor Block: F4  (v4.2.0.1)    12/24/2020 Genetic Testing   Negative hereditary cancer genetic testing: no pathogenic variants detected in [facility] Common Hereditary Cancers +RNA Panel.  The report date is December 24, 2020.  The Common Hereditary Cancers + RNA Panel offered by [facility] includes sequencing, deletion/duplication, and RNA testing of the following 47 genes: APC, ATM, AXIN2, BARD1, BMPR1A, BRCA1, BRCA2, BRIP1, CDH1, CDK4*, CDKN2A (p14ARF)*, CDKN2A (p16INK4a)*, CHEK2, CTNNA1, DICER1, EPCAM (Deletion/duplication testing only), GREM1 (promoter region deletion/duplication testing only), KIT, MEN1, MLH1, MSH2, MSH3, MSH6, MUTYH, NBN, NF1, NHTL1, PALB2, PDGFRA*, PMS2, POLD1, POLE, PTEN, RAD50, RAD51C, RAD51D, SDHB, SDHC, SDHD, SMAD4, SMARCA4. STK11, TP53, TSC1, TSC2, and VHL.  The following genes were evaluated for sequence changes only: SDHA and HOXB13 c.251G>A variant only.  RNA analysis is not performed for the * genes.     Ductal carcinoma in situ (DCIS) of right breast  01/30/2021 Initial Diagnosis   Ductal carcinoma in situ (DCIS) of right breast   01/31/2021 Cancer Staging   Staging form: Breast, AJCC 8th Edition - Clinical stage from 01/31/2021: Stage 0 (cTis (DCIS), cN0, cM0, ER+, PR+) - Signed by Chauncey Cruel, MD on 01/31/2021 Stage prefix: Initial  diagnosis Nuclear grade: G1     Interval History: I last saw the patient in late March 2022. She underwent breast surgery with Dr. Ninfa Linden in 01/2021 and she has now completed adjuvant RT as of 04/25/21 for Gr 1-2, ER/PR+ DCIS of right breast.  The patient reports doing well during treatment for breast cancer. She is seeing Dr. Jana Hakim for follow-up next week. She denies any vaginal bleeding or discharge. She endorses regular bowel function. She has some urinary urgency, denies being bothered by symptoms. Endorses a good appetite without nausea or emesis.   She is looking forward to an upcoming trip to the [facility] and [place].  Past Medical/Surgical History: Past Medical History:  Diagnosis Date   Allergic rhinitis    Allergy    Anxiety    Bladder cancer ([place]) 10/17/2020   Borderline glaucoma, left    no eye drops   BPPV (benign paroxysmal positional vertigo)    Breast cancer ([place]) 01/23/2021   Cancer ([place]) 10/17/2020   Cataract    Chondromalacia of patella    left   Coronary artery disease    cardiac cath 11-03-2017 done for positive ETT,  showed very miminal nonobstrutive cad, ef 55-65%, non caridac chest pain   Diverticulosis of colon    Endometrial cancer ([place])    Family history of breast cancer 12/08/2020  Family history of ovarian cancer 12/08/2020   Family history of prostate cancer 12/08/2020   Frequency of urination    GERD (gastroesophageal reflux disease)    History of chest pain 10/14/2017   ED visit w/ admission,  hypertensive urgency;  follow up with dr Doylene Canard--- had outpatient ETT 10-21-2017 positive ischemia, echo 10-16-2017 G1DD, 60-65%;    s/p cardiac cath 11-03-2017 w/ very miminal nonobstructive cad , chest pain non cardiac   History of diverticulitis    History of kidney stones    HSV-1 infection    fever blisters   Hx of adenomatous colonic polyps 02/08/2018   Hypertension    followed by pcp   MDD (major depressive disorder)    OA (osteoarthritis)     left knee, shoulder   Osteoporosis    Polycythemia    long hx followed by pcp and  sees hematology/ oncology--- dr Jerilynn Mages. Julien Nordmann on as needed basis, chronic and told to donate blood regularly   Stenosis of cervix    Uterine mass    Wears glasses     Past Surgical History:  Procedure Laterality Date   ABDOMINAL HYSTERECTOMY     BREAST LUMPECTOMY WITH RADIOACTIVE SEED LOCALIZATION Right 02/16/2021   Procedure: RIGHT BREAST LUMPECTOMY WITH RADIOACTIVE SEED LOCALIZATION;  Surgeon: Coralie Keens, MD;  Location: [facility];  Service: General;  Laterality: Right;   CATARACT EXTRACTION W/ INTRAOCULAR LENS  IMPLANT, BILATERAL  2013   COLONOSCOPY  02-02-2018 dr Carlean Purl   CYSTOSCOPY  10/17/2020   Procedure: CYSTOSCOPY;  Surgeon: Nunzio Cobbs, MD;  Location: [facility];  Service: Gynecology;;   CYSTOSCOPY N/A 11/09/2020   Procedure: Consuela Mimes;  Surgeon: Remi Haggard, MD;  Location: [facility];  Service: Urology;  Laterality: N/A;  Dr. Milford Cage needs to go first. [name] and rigid bx forceps.   DIAGNOSTIC LAPAROSCOPY  1996   DILATATION & CURETTAGE/HYSTEROSCOPY WITH MYOSURE N/A 10/17/2020   Procedure: DILATATION & CURETTAGE/HYSTEROSCOPY WITH  MYOSURE RESECTION OF ENDOMETRIAL MASS,  OPENING OF CERVICAL STENOSIS, DRAINAGE OF UTERINE FLUID;  Surgeon: Nunzio Cobbs, MD;  Location: [facility];  Service: Gynecology;  Laterality: N/A;   EYE SURGERY     FRACTURE SURGERY     HEEL SPUR SURGERY Right 2015   Dr. Durward Fortes   HEMORRHOID SURGERY  2004   LEFT HEART CATH AND CORONARY ANGIOGRAPHY N/A 11/03/2017   Procedure: LEFT HEART CATH AND CORONARY ANGIOGRAPHY;  Surgeon: Jettie Booze, MD;  Location: [place] [facility];  Service: Cardiovascular;  Laterality: N/A;   LYMPH NODE DISSECTION N/A 11/09/2020   Procedure: BILATERAL LYMPH NODE DISSECTION;MINI LAPAROTOMY;  Surgeon: Lafonda Mosses, MD;  Location: [facility];  Service: Gynecology;   Laterality: N/A;   OPERATIVE ULTRASOUND N/A 10/17/2020   Procedure: OPERATIVE ULTRASOUND;  Surgeon: Nunzio Cobbs, MD;  Location: [facility];  Service: Gynecology;  Laterality: N/A;   ROBOTIC ASSISTED TOTAL HYSTERECTOMY WITH BILATERAL SALPINGO OOPHERECTOMY Bilateral 11/09/2020   Procedure: XI ROBOTIC ASSISTED TOTAL HYSTERECTOMY WITH BILATERAL SALPINGO OOPHORECTOMY,;  Surgeon: Lafonda Mosses, MD;  Location: [facility];  Service: Gynecology;  Laterality: Bilateral;   SEPTOPLASTY  x2 1970's   TONSILLECTOMY  age 25   TRANSURETHRAL RESECTION OF BLADDER TUMOR N/A 11/09/2020   Procedure: TRANSURETHRAL RESECTION OF BLADDER TUMOR (TURBT);  Surgeon: Remi Haggard, MD;  Location: [facility];  Service: Urology;  Laterality: N/A;    Family History  Problem Relation Age of  Onset   Osteoporosis Mother    Heart attack Mother    Alzheimer's disease Mother    Arthritis Mother    Depression Mother    Diabetes Father    Hypertension Father    Heart failure Father    Alcohol abuse Father    COPD Father    Ovarian cancer Sister 35   Stroke Maternal Grandmother    Prostate cancer Maternal Grandfather 82       metastatic   Cancer Maternal Grandfather    Breast cancer Other        MGM's sister; dx after 52   Vision loss Paternal Grandmother    Colon cancer Neg Hx    Colon polyps Neg Hx    Esophageal cancer Neg Hx    Rectal cancer Neg Hx    Stomach cancer Neg Hx    Endometrial cancer Neg Hx    Pancreatic cancer Neg Hx     Social History   Socioeconomic History   Marital status: Married    Spouse name: Not on file   Number of children: Not on file   Years of education: Not on file   Highest education level: Not on file  Occupational History   Not on file  Tobacco Use   Smoking status: Never   Smokeless tobacco: Never  Vaping Use   Vaping Use: Never used  Substance and Sexual Activity   Alcohol use: Yes    Alcohol/week: 2.0 standard drinks    Types: 2 Glasses of  wine per week    Comment: occasional wine   Drug use: Never   Sexual activity: Yes    Partners: Male    Birth control/protection: Post-menopausal, None  Other Topics Concern   Not on file  Social History Narrative   Not on file   Social Determinants of Health   Financial Resource Strain: Not on file  Food Insecurity: Not on file  Transportation Needs: Not on file  Physical Activity: Not on file  Stress: Not on file  Social Connections: Not on file    Current Medications:  Current Outpatient Medications:    amLODipine (NORVASC) 2.5 MG tablet, Take 2.5 mg by mouth daily., Disp: , Rfl: 1   buPROPion (WELLBUTRIN XL) 300 MG 24 hr tablet, Take 300 mg by mouth daily., Disp: , Rfl:    Calcium Carb-Cholecalciferol (CALCIUM 500+D3 PO), Take 1 tablet by mouth every evening., Disp: , Rfl:    fluticasone (FLONASE) 50 MCG/ACT nasal spray, Place 2 sprays into both nostrils daily., Disp: , Rfl:    loratadine (CLARITIN) 10 MG tablet, Take 10 mg by mouth daily., Disp: , Rfl:    losartan (COZAAR) 100 MG tablet, Take 100 mg by mouth daily., Disp: , Rfl:    Multiple Vitamin (MULTIVITAMIN WITH MINERALS) TABS tablet, Take 1 tablet by mouth every evening., Disp: , Rfl:    sertraline (ZOLOFT) 100 MG tablet, Take 150 mg by mouth daily. , Disp: , Rfl:    valACYclovir (VALTREX) 1000 MG tablet, Take 2,000 mg by mouth 2 (two) times daily as needed (for fever blisters). , Disp: , Rfl:    zolpidem (AMBIEN) 10 MG tablet, Take 5 mg by mouth at bedtime., Disp: , Rfl:    traMADol (ULTRAM) 50 MG tablet, Take 1 tablet (50 mg total) by mouth every 6 (six) hours as needed for moderate pain or severe pain. (Patient not taking: No sig reported), Disp: 20 tablet, Rfl: 0  Review of Systems: Denies appetite changes, fevers, chills,  fatigue, unexplained weight changes. Denies hearing loss, neck lumps or masses, mouth sores, ringing in ears or voice changes. Denies cough or wheezing.  Denies shortness of breath. Denies  chest pain or palpitations. Denies leg swelling. Denies abdominal distention, pain, blood in stools, constipation, diarrhea, nausea, vomiting, or early satiety. Denies pain with intercourse, dysuria, hematuria or incontinence. Denies hot flashes, pelvic pain, vaginal bleeding or vaginal discharge.   Denies joint pain, back pain or muscle pain/cramps. Denies itching, rash, or wounds. Denies dizziness, headaches, numbness or seizures. Denies swollen lymph nodes or glands, denies easy bruising or bleeding. Denies anxiety, depression, confusion, or decreased concentration.  Physical Exam: BP 125/75 (BP Location: Left Arm, Patient Position: Sitting)   Pulse 66   Temp 98.5 F (36.9 C) (Oral)   Resp 16   Ht _0  (1.676 m)   Wt 184 lb 11.2 oz (83.8 kg)   LMP 09/03/2011   SpO2 100%   BMI 29.81 kg/m  General: Alert, oriented, no acute distress. HEENT: Normocephalic, atraumatic, sclera anicteric. Chest: Unlabored breathing on room air. Abdomen: soft, nontender.  Normoactive bowel sounds.  No masses or hepatosplenomegaly appreciated.  Well-healed incisions. Extremities: Grossly normal range of motion.  Warm, well perfused.  No edema bilaterally. Skin: No rashes or lesions noted. Lymphatics: No cervical, supraclavicular, or inguinal adenopathy. GU: Normal appearing external genitalia without erythema, excoriation, or lesions.  Speculum exam reveals no bleeding or erythema. 49m peduncuilated grwoth along mid portion of vaginal cuff, appears c/w granulation tissue. No other masses or lesions.  Bimanual exam reveals cuff smooth, no masses.  Rectovaginal exam  Confirms these findings, no nodularity.  Vaginal biopsy Preop diagnosis: vaginal cuff mass Postop diagnosis: same as above Physician: TBerline LopesMD EBL: minimal Specimen: vaginal cuff mass Procedure: discussed procedure with the patient and she gave verbal consent. Speculum was placed in the vagina and cuff well visualized. The mass was  swabbed with betadine x3. Tischler forceps were used to biopsy and remove pedunculated mass seen. This was placed in formalin. Silver nitrate was used to achieve hemostasis. Patient tolerated procedure well.  Laboratory & Radiologic Studies: None new  Assessment & Plan: DAvianna MoynahanLittle is a 65y.o. woman with Stage 1A grade 2 endometrioid endometrial adenocarcinoma who presents for surveillance visit.  Doing well. NED on exam - I suspect what I biopsied is granulation tissue. Will call or release results on mychart once back.   Per NCCN surveillance recommendations, we will continue with visits every 6 months for 5 years. We reviewed signs and symptoms that would be concerning for disease recurrence and the patient knows to call to see me if she develops any of these symptoms between scheduled visits.   32 minutes of total time was spent for this patient encounter, including preparation, face-to-face counseling with the patient and coordination of care, and documentation of the encounter.  KJeral Pinch MD  Division of Gynecologic Oncology  Department of Obstetrics and Gynecology  [facility]

## 2021-05-09 NOTE — Patient Instructions (Addendum)
It was great to see you! I will call you with the results of the biopsy or release them on mychart once back.  I will see you in 6 months. Since my schedule is only out until the end of the year, please call back after 09/02/2021 to get an appointment scheduled to see me in 10/2021. As always, if you develop new symptoms before then (such as vaginal bleeding, pelvic pain, unintentional weight loss, change to bowel function, etc), please call to see me sooner.

## 2021-05-10 ENCOUNTER — Other Ambulatory Visit: Payer: Self-pay | Admitting: *Deleted

## 2021-05-10 DIAGNOSIS — D0511 Intraductal carcinoma in situ of right breast: Secondary | ICD-10-CM

## 2021-05-11 ENCOUNTER — Telehealth: Payer: Self-pay

## 2021-05-11 LAB — SURGICAL PATHOLOGY

## 2021-05-11 NOTE — Telephone Encounter (Signed)
ENCOUNTER OPENED IN ERROR

## 2021-05-13 NOTE — Progress Notes (Signed)
[place]  Telephone:(336) [phone] [phone]) [phone]     ID: Danielle Knapp DOB: 1956-05-25  MR#: [id number]  GBE#:[id number]  Patient Care Team: Harlan Stains, MD as PCP - General (Family Medicine) Mauro Kaufmann, RN as Oncology Nurse Navigator Rockwell Germany, RN as Oncology Nurse Navigator Coralie Keens, MD as Consulting Physician (General Surgery) Dalaysia Harms, Virgie Dad, MD as Consulting Physician (Oncology) Kyung Rudd, MD as Consulting Physician (Radiation Oncology) Yisroel Ramming, Everardo All, MD as Consulting Physician (Obstetrics and Gynecology) Remi Haggard, MD as Consulting Physician (Urology) Lafonda Mosses, MD as Consulting Physician (Gynecologic Oncology) Chauncey Cruel, MD OTHER MD:  CHIEF COMPLAINT: Ductal carcinoma in situ  CURRENT TREATMENT: To start anastrozole   INTERVAL HISTORY: Danielle Knapp" returns today for follow up of her noninvasive breast cancer. She was evaluated in the multidisciplinary breast cancer clinic on 01/31/2021.  She underwent right lumpectomy on 02/16/2021 under Dr. Ninfa Linden. Pathology from the procedure [phone]) showed: intermediate and low grade ductal carcinoma in situ with calcifications; atypical lobular hyperplasia; fibrocystic change with calcifications; complex sclerosing lesion; intraductal papilloma.  She was referred back to Dr. Lisbeth Renshaw on 03/20/2021 to review radiation therapy. She subsequently received treatment from 03/29/2021 through 04/25/2021.  Of note, she also underwent vaginal cuff biopsy on 05/09/2021 under Dr. Berline Lopes. Pathology [id number]) showed: fragment of granulation tissue; no evidence of malignancy.   REVIEW OF SYSTEMS: Danielle Knapp did initially quite well with radiation but of course towards the end she developed significant fatigue which continues.  She also had erythema and peeling.  She tells me this is healing very quickly.  For exercise she walks about 2 miles about every other  day.  She is planning a trip to the [facility] with her husband where they will do quite a bit of hiking.  She is hoping to have a helicopter ride into the [place] as well.  Aside from this a detailed review of systems today was stable   COVID 19 VACCINATION STATUS: Status post Pfizer vaccine x2+1 booster as of June 2022   HISTORY OF CURRENT ILLNESS: From the original intake note:  Danielle Knapp has a personal history of bladder cancer and endometrial cancer. She previously underwent genetic testing on 12/07/2020, and results were negative.  She had routine screening mammography on 01/03/2021 showing a possible abnormality in the right breast. She underwent right diagnostic mammography with tomography at [facility] on 01/11/2021 showing: breast density category C; 2.3 cm cluster of calcifications in upper-outer right breast.  Accordingly on 01/23/2021 she proceeded to biopsy of the right breast area in question. The pathology from this procedure ([id number]) showed: Ductal carcinoma in situ, low-grade  Prognostic indicators significant for: estrogen receptor, 100% positive and progesterone receptor, 90% positive, with strong to moderate intensity.   Cancer Staging Ductal carcinoma in situ (DCIS) of right breast Staging form: Breast, AJCC 8th Edition - Clinical stage from 01/31/2021: Stage 0 (cTis (DCIS), cN0, cM0, ER+, PR+) - Signed by Chauncey Cruel, MD on 01/31/2021 Stage prefix: Initial diagnosis Nuclear grade: G1  The patient's subsequent history is as detailed below.   PAST MEDICAL HISTORY: Past Medical History:  Diagnosis Date   Allergic rhinitis    Allergy    Anxiety    Bladder cancer ([place]) 10/17/2020   Borderline glaucoma, left    no eye drops   BPPV (benign paroxysmal positional vertigo)    Breast cancer ([place]) 01/23/2021   Cancer ([place]) 10/17/2020   Cataract  Chondromalacia of patella    left   Coronary artery disease    cardiac cath 11-03-2017 done for positive ETT,   showed very miminal nonobstrutive cad, ef 55-65%, non caridac chest pain   Diverticulosis of colon    Endometrial cancer ([place])    Family history of breast cancer 12/08/2020   Family history of ovarian cancer 12/08/2020   Family history of prostate cancer 12/08/2020   Frequency of urination    GERD (gastroesophageal reflux disease)    History of chest pain 10/14/2017   ED visit w/ admission,  hypertensive urgency;  follow up with dr Doylene Canard--- had outpatient ETT 10-21-2017 positive ischemia, echo 10-16-2017 G1DD, 60-65%;    s/Knapp cardiac cath 11-03-2017 w/ very miminal nonobstructive cad , chest pain non cardiac   History of diverticulitis    History of kidney stones    HSV-1 infection    fever blisters   Hx of adenomatous colonic polyps 02/08/2018   Hypertension    followed by pcp   MDD (major depressive disorder)    OA (osteoarthritis)    left knee, shoulder   Osteoporosis    Polycythemia    long hx followed by pcp and  sees hematology/ oncology--- dr Jerilynn Mages. Julien Nordmann on as needed basis, chronic and told to donate blood regularly   Stenosis of cervix    Uterine mass    Wears glasses     PAST SURGICAL HISTORY: Past Surgical History:  Procedure Laterality Date   ABDOMINAL HYSTERECTOMY     BREAST LUMPECTOMY WITH RADIOACTIVE SEED LOCALIZATION Right 02/16/2021   Procedure: RIGHT BREAST LUMPECTOMY WITH RADIOACTIVE SEED LOCALIZATION;  Surgeon: Coralie Keens, MD;  Location: [place];  Service: General;  Laterality: Right;   CATARACT EXTRACTION W/ INTRAOCULAR LENS  IMPLANT, BILATERAL  2013   COLONOSCOPY  02-02-2018 dr Carlean Purl   CYSTOSCOPY  10/17/2020   Procedure: CYSTOSCOPY;  Surgeon: Nunzio Cobbs, MD;  Location: [facility];  Service: Gynecology;;   CYSTOSCOPY N/A 11/09/2020   Procedure: Consuela Mimes;  Surgeon: Remi Haggard, MD;  Location: [facility];  Service: Urology;  Laterality: N/A;  Dr. Milford Cage needs to go first. [name] and rigid bx forceps.    DIAGNOSTIC LAPAROSCOPY  1996   DILATATION & CURETTAGE/HYSTEROSCOPY WITH MYOSURE N/A 10/17/2020   Procedure: DILATATION & CURETTAGE/HYSTEROSCOPY WITH  MYOSURE RESECTION OF ENDOMETRIAL MASS,  OPENING OF CERVICAL STENOSIS, DRAINAGE OF UTERINE FLUID;  Surgeon: Nunzio Cobbs, MD;  Location: [facility];  Service: Gynecology;  Laterality: N/A;   EYE SURGERY     FRACTURE SURGERY     HEEL SPUR SURGERY Right 2015   Dr. Durward Fortes   HEMORRHOID SURGERY  2004   LEFT HEART CATH AND CORONARY ANGIOGRAPHY N/A 11/03/2017   Procedure: LEFT HEART CATH AND CORONARY ANGIOGRAPHY;  Surgeon: Jettie Booze, MD;  Location: [place] [facility];  Service: Cardiovascular;  Laterality: N/A;   LYMPH NODE DISSECTION N/A 11/09/2020   Procedure: BILATERAL LYMPH NODE DISSECTION;MINI LAPAROTOMY;  Surgeon: Lafonda Mosses, MD;  Location: [facility];  Service: Gynecology;  Laterality: N/A;   OPERATIVE ULTRASOUND N/A 10/17/2020   Procedure: OPERATIVE ULTRASOUND;  Surgeon: Nunzio Cobbs, MD;  Location[facility];  Service: Gynecology;  Laterality: N/A;   ROBOTIC ASSISTED TOTAL HYSTERECTOMY WITH BILATERAL SALPINGO OOPHERECTOMY Bilateral 11/09/2020   Procedure: XI ROBOTIC ASSISTED TOTAL HYSTERECTOMY WITH BILATERAL SALPINGO OOPHORECTOMY,;  Surgeon: Lafonda Mosses, MD;  Location: [facility];  Service: Gynecology;  Laterality: Bilateral;   SEPTOPLASTY  x2 1970's   TONSILLECTOMY  age 12   TRANSURETHRAL RESECTION OF BLADDER TUMOR N/A 11/09/2020   Procedure: TRANSURETHRAL RESECTION OF BLADDER TUMOR (TURBT);  Surgeon: Remi Haggard, MD;  Location: [facility];  Service: Urology;  Laterality: N/A;    FAMILY HISTORY: Family History  Problem Relation Age of Onset   Osteoporosis Mother    Heart attack Mother    Alzheimer's disease Mother    Arthritis Mother    Depression Mother    Diabetes Father    Hypertension Father    Heart failure Father    Alcohol abuse Father    COPD Father     Ovarian cancer Sister 11   Stroke Maternal Grandmother    Prostate cancer Maternal Grandfather 74       metastatic   Cancer Maternal Grandfather    Breast cancer Other        MGM's sister; dx after 45   Vision loss Paternal Grandmother    Colon cancer Neg Hx    Colon polyps Neg Hx    Esophageal cancer Neg Hx    Rectal cancer Neg Hx    Stomach cancer Neg Hx    Endometrial cancer Neg Hx    Pancreatic cancer Neg Hx   The patient's father died at the age of 70 from a stroke.  The patient's mother died with congestive heart failure and Alzheimer's disease at age 36.  The patient had no brothers, 1 sister.  That sister has a history of ovarian cancer.   GYNECOLOGIC HISTORY:  Patient's last menstrual period was 09/03/2011. Menarche: 65 years old Danielle Knapp 0 LMP 2013 Hysterectomy? Yes, 11/09/2020 for endometrial cancer BSO? yes   SOCIAL HISTORY: (updated 01/2021)  Danielle Knapp "Danielle Knapp" works as a Science writer for [employer].  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  The patient has no birth children but she has a stepdaughter age 48 and a 82 age 12 both living in [place].  There are no grandchildren.  The patient is a Psychologist, forensic.   ADVANCED DIRECTIVES: In the absence of any documents to the contrary the patient's husband is her healthcare power of attorney   HEALTH MAINTENANCE: Social History   Tobacco Use   Smoking status: Never   Smokeless tobacco: Never  Vaping Use   Vaping Use: Never used  Substance Use Topics   Alcohol use: Yes    Alcohol/week: 2.0 standard drinks    Types: 2 Glasses of wine per week    Comment: occasional wine   Drug use: Never     Colonoscopy: 01/2018 (Dr. Carlean Purl), recall 2024  PAP: 09/2020, negative  Bone density: 11/2018, -1.7   Allergies  Allergen Reactions   Ciprofloxacin Hcl Anaphylaxis and Rash   Erythromycin Diarrhea and Other (See  Comments)   Metronidazole Anaphylaxis, Rash and Other (See Comments)   Tolmetin Other (See Comments)    Can tolerate Ibuprofen.  Can tolerate Ibuprofen.    Lisinopril Cough   Eszopiclone Other (See Comments)    Current Outpatient Medications  Medication Sig Dispense Refill   amLODipine (NORVASC) 2.5 MG tablet Take 2.5 mg by mouth daily.  1   buPROPion (WELLBUTRIN XL) 300 MG 24 hr tablet Take 300 mg by mouth daily.     Calcium Carb-Cholecalciferol (CALCIUM 500+D3 PO) Take 1 tablet by mouth every evening.     fluticasone (FLONASE) 50  MCG/ACT nasal spray Place 2 sprays into both nostrils daily.     loratadine (CLARITIN) 10 MG tablet Take 10 mg by mouth daily.     losartan (COZAAR) 100 MG tablet Take 100 mg by mouth daily.     Multiple Vitamin (MULTIVITAMIN WITH MINERALS) TABS tablet Take 1 tablet by mouth every evening.     sertraline (ZOLOFT) 100 MG tablet Take 150 mg by mouth daily.      traMADol (ULTRAM) 50 MG tablet Take 1 tablet (50 mg total) by mouth every 6 (six) hours as needed for moderate pain or severe pain. (Patient not taking: No sig reported) 20 tablet 0   valACYclovir (VALTREX) 1000 MG tablet Take 2,000 mg by mouth 2 (two) times daily as needed (for fever blisters).      zolpidem (AMBIEN) 10 MG tablet Take 5 mg by mouth at bedtime.     No current facility-administered medications for this visit.    OBJECTIVE: White woman who appears stated  Vitals:   05/14/21 1500  BP: 137/78  Pulse: 72  Resp: 18  Temp: 97.7 F (36.5 C)  SpO2: 99%      Body mass index is 29.34 kg/m.   Wt Readings from Last 3 Encounters:  05/14/21 181 lb 12.8 oz (82.5 kg)  05/09/21 184 lb 11.2 oz (83.8 kg)  04/30/21 186 lb (84.4 kg)      ECOG FS:1 - Symptomatic but completely ambulatory  Sclerae unicteric, EOMs intact Wearing a mask No cervical or supraclavicular adenopathy Lungs no rales or rhonchi Heart regular rate and rhythm Abd soft, nontender, positive bowel sounds MSK no focal  spinal tenderness, no upper extremity lymphedema Neuro: nonfocal, well oriented, appropriate affect Breasts: The right breast is status postlumpectomy and radiation.  There is erythema and desquamation but overall the cosmetic result is good.  Left breast and both axillae are benign   LAB RESULTS:  CMP     Component Value Date/Time   NA 141 05/14/2021 1414   NA 147 (H) 10/31/2017 0829   NA 141 08/11/2012 0935   K 4.6 05/14/2021 1414   K 3.6 08/11/2012 0935   CL 106 05/14/2021 1414   CL 105 08/11/2012 0935   CO2 28 05/14/2021 1414   CO2 26 08/11/2012 0935   GLUCOSE 95 05/14/2021 1414   GLUCOSE 115 (H) 08/11/2012 0935   BUN 13 05/14/2021 1414   BUN 18 10/31/2017 0829   BUN 18.0 08/11/2012 0935   CREATININE 0.92 05/14/2021 1414   CREATININE 0.8 08/11/2012 0935   CALCIUM 9.6 05/14/2021 1414   CALCIUM 9.9 08/11/2012 0935   PROT 7.0 05/14/2021 1414   PROT 7.4 08/11/2012 0935   ALBUMIN 4.2 05/14/2021 1414   ALBUMIN 4.1 08/11/2012 0935   AST 18 05/14/2021 1414   AST 17 08/11/2012 0935   ALT 20 05/14/2021 1414   ALT 24 08/11/2012 0935   ALKPHOS 68 05/14/2021 1414   ALKPHOS 53 08/11/2012 0935   BILITOT 1.0 05/14/2021 1414   BILITOT 0.79 08/11/2012 0935   GFRNONAA >60 05/14/2021 1414   GFRAA >60 10/25/2019 1237    No results found for: TOTALPROTELP, ALBUMINELP, A1GS, A2GS, BETS, BETA2SER, GAMS, MSPIKE, SPEI  Lab Results  Component Value Date   WBC 6.0 05/14/2021   NEUTROABS 4.3 05/14/2021   HGB 17.3 (H) 05/14/2021   HCT 47.0 (H) 05/14/2021   MCV 88.0 05/14/2021   PLT 230 05/14/2021    No results found for: LABCA2  No components found for: WCHENI778  No  results for input(s): INR in the last 168 hours.  No results found for: LABCA2  No results found for: FHQ197  No results found for: JOI325  No results found for: QDI264  No results found for: CA2729  No components found for: HGQUANT  No results found for: CEA1 / No results found for: CEA1   No results  found for: AFPTUMOR  No results found for: CHROMOGRNA  No results found for: KPAFRELGTCHN, LAMBDASER, KAPLAMBRATIO (kappa/lambda light chains)  No results found for: HGBA, HGBA2QUANT, HGBFQUANT, HGBSQUAN (Hemoglobinopathy evaluation)   Lab Results  Component Value Date   LDH 188 08/11/2012    Lab Results  Component Value Date   IRON 192 (H) 10/25/2019   TIBC 424 10/25/2019   IRONPCTSAT 45 10/25/2019   (Iron and TIBC)  Lab Results  Component Value Date   FERRITIN 39 10/25/2019    Urinalysis    Component Value Date/Time   COLORURINE STRAW (A) 11/06/2020 [place] 11/06/2020 1409   LABSPEC 1.005 11/06/2020 1409   PHURINE 6.0 11/06/2020 1409   GLUCOSEU NEGATIVE 11/06/2020 1409   HGBUR NEGATIVE 11/06/2020 1409   BILIRUBINUR NEGATIVE 11/06/2020 1409   BILIRUBINUR n 02/28/2016 0837   KETONESUR NEGATIVE 11/06/2020 1409   PROTEINUR NEGATIVE 11/06/2020 1409   UROBILINOGEN negative 02/28/2016 0837   NITRITE NEGATIVE 11/06/2020 1409   LEUKOCYTESUR NEGATIVE 11/06/2020 1409    STUDIES: No results found.   ELIGIBLE FOR AVAILABLE RESEARCH PROTOCOL: no  ASSESSMENT: 65 y.o. [place] woman with  (1) status post total abdominal hysterectomy with bilateral salpingo-oophorectomy 11/09/2020 for an endometrioid carcinoma, pT1a, pN0, FIGO grade 2  (a) no involvement of the uterine serosa or cervical stroma or adnexa,  (b) a total of 4 left and 5 right pelvic lymph nodes removed  (c) loss of MLH1 and PMS2 nuclear expression  (2) low-grade noninvasive papillary urothelial cancer biopsied 11/09/2020 (followed by [place] Cage)  (3) genetics testing 12/24/2020 through the [facility] Common Hereditary Cancers +RNA Panel found no deleterious mutations in APC, ATM, AXIN2, BARD1, BMPR1A, BRCA1, BRCA2, BRIP1, CDH1, CDK4*, CDKN2A (p14ARF)*, CDKN2A (p16INK4a)*, CHEK2, CTNNA1, DICER1, EPCAM (Deletion/duplication testing only), GREM1 (promoter region deletion/duplication testing  only), KIT, MEN1, MLH1, MSH2, MSH3, MSH6, MUTYH, NBN, NF1, NHTL1, PALB2, PDGFRA*, PMS2, POLD1, POLE, PTEN, RAD50, RAD51C, RAD51D, SDHB, SDHC, SDHD, SMAD4, SMARCA4. STK11, TP53, TSC1, TSC2, and VHL.  The following genes were evaluated for sequence changes only: SDHA and HOXB13 c.251G>A variant only.  RNA analysis is not performed for the * genes.   (4) status post right breast biopsy 01/23/2021 for ductal carcinoma in situ, low-grade, estrogen and progesterone receptor positive  (5) status post right lumpectomy 02/16/2021 for ductal carcinoma in situ, grade 1 / 2, with negative margins.  (6) adjuvant radiation 03/29/2021 through 04/25/2021 Site Technique Total Dose (Gy) Dose per Fx (Gy) Completed Fx Beam Energies  Breast, Right: Breast_Rt 3D 42.56/42.56 2.66 16/16 10X, 6XFFF  Breast, Right: Breast_Rt_Bst 3D 8/8 2 4/4 6X, 10X   (7) to start anastrozole 06/02/2021  (A) status post hysterectomy March 2022  (B) DEXA scan 11/10/2018 at [facility] showed a T score of -1.7  (8 polycythemia: Hb >16.0 in non-smoking woman  (A) new generation sequencing was negative for JAK2, MPL and CAL R as well as other associated genes.  (B) CT of the abdomen and pelvis 11/30/2020 shows no renal or adrenal mass  (C) erythropoietin on 08/11/2012 was 6.4, hemoglobin same day was 16.2  PLAN: Anzal has completed local therapy for her noninvasive breast  cancer and is ready to consider antiestrogens.  We reviewed the fact that her tumor was not life-threatening so taking antiestrogens is not a matter of life or death.  It is a matter of lowering the risk of local recurrence, which is already low; lowering the risk of developing another breast cancer, which is otherwise about 1 %/year; and possibly lowering the risk of endometrial cancer since many endometrial cancers are also estrogen dependent.  We then discussed the possible toxicities side effects and complications of tamoxifen and anastrozole. She understands that  anastrozole and the aromatase inhibitors in general work by blocking estrogen production. Accordingly vaginal dryness, decrease in bone density, and of course hot flashes can result. The aromatase inhibitors can also negatively affect the cholesterol profile, although that is a minor effect. One out of 5 women on aromatase inhibitors we will feel "old and achy". This arthralgia/myalgia syndrome, which resembles fibromyalgia clinically, does resolve with stopping the medications. Accordingly this is not a reason to not try an aromatase inhibitor but it is a frequent reason to stop it (in other words 20% of women will not be able to tolerate these medications).  Tamoxifen on the other hand does not block estrogen production. It does not "take away a woman's estrogen". It blocks the estrogen receptor in breast cells. Like anastrozole, it can also cause hot flashes. As opposed to anastrozole, tamoxifen has many estrogen-like effects. It is technically an estrogen receptor modulator. This means that in some tissues tamoxifen works like estrogen-- for example it helps strengthen the bones. It tends to improve the cholesterol profile. It can cause thickening of the endometrial lining, and even endometrial polyps or rarely cancer of the uterus.(The risk of uterine cancer due to tamoxifen is one additional cancer per thousand women year). It can cause vaginal wetness or stickiness. It can cause blood clots through this estrogen-like effect--the risk of blood clots with tamoxifen is exactly the same as with birth control pills or hormone replacement.  Neither of these agents causes mood changes or weight gain, despite the popular belief that they can have these side effects. We have data from studies comparing either of these drugs with placebo, and in those cases the control group had the same amount of weight gain and depression as the group that took the drug.  We are going to start with anastrozole and we will do a  virtual visit early December.  If she is tolerating it well the plan will be to continue that for 5 years.  Otherwise we will consider switching to tamoxifen.  We also reviewed her erythrocytosis labs.  She was previously worked up by my partner Dr. Earlie Server and she is aware that this is not a malignant condition.  Once she is a year out from her breast cancer surgery she may resume donating blood through the [employer]  Total encounter time 35 minutes.Sarajane Jews C. Saksham Akkerman, MD 05/14/2021 4:26 PM Medical Oncology and Hematology [facility] [place], [place] [address] Tel. [phone]    Fax. [phone]   This document serves as a record of services personally performed by Lurline Del, MD. It was created on his behalf by Wilburn Mylar, a trained medical scribe. The creation of this record is based on the scribe's personal observations and the provider's statements to them.   I, Lurline Del MD, have reviewed the above documentation for accuracy and completeness, and I agree with the above.   *Total Encounter  Time as defined by the Centers for Medicare and Medicaid Services includes, in addition to the face-to-face time of a patient visit (documented in the note above) non-face-to-face time: obtaining and reviewing outside history, ordering and reviewing medications, tests or procedures, care coordination (communications with other health care professionals or caregivers) and documentation in the medical record.

## 2021-05-14 ENCOUNTER — Inpatient Hospital Stay: Payer: 59

## 2021-05-14 ENCOUNTER — Inpatient Hospital Stay (HOSPITAL_BASED_OUTPATIENT_CLINIC_OR_DEPARTMENT_OTHER): Payer: 59 | Admitting: Oncology

## 2021-05-14 ENCOUNTER — Other Ambulatory Visit: Payer: Self-pay

## 2021-05-14 VITALS — BP 137/78 | HR 72 | Temp 97.7°F | Resp 18 | Wt 181.8 lb

## 2021-05-14 DIAGNOSIS — D0511 Intraductal carcinoma in situ of right breast: Secondary | ICD-10-CM | POA: Diagnosis not present

## 2021-05-14 DIAGNOSIS — C541 Malignant neoplasm of endometrium: Secondary | ICD-10-CM

## 2021-05-14 DIAGNOSIS — D751 Secondary polycythemia: Secondary | ICD-10-CM

## 2021-05-14 DIAGNOSIS — C679 Malignant neoplasm of bladder, unspecified: Secondary | ICD-10-CM

## 2021-05-14 DIAGNOSIS — Z8542 Personal history of malignant neoplasm of other parts of uterus: Secondary | ICD-10-CM | POA: Diagnosis not present

## 2021-05-14 LAB — CMP (CANCER CENTER ONLY)
ALT: 20 U/L (ref 0–44)
AST: 18 U/L (ref 15–41)
Albumin: 4.2 g/dL (ref 3.5–5.0)
Alkaline Phosphatase: 68 U/L (ref 38–126)
Anion gap: 7 (ref 5–15)
BUN: 13 mg/dL (ref 8–23)
CO2: 28 mmol/L (ref 22–32)
Calcium: 9.6 mg/dL (ref 8.9–10.3)
Chloride: 106 mmol/L (ref 98–111)
Creatinine: 0.92 mg/dL (ref 0.44–1.00)
GFR, Estimated: >60 mL/min (ref >60)
Glucose, Bld: 95 mg/dL (ref 70–99)
Potassium: 4.6 mmol/L (ref 3.5–5.1)
Sodium: 141 mmol/L (ref 135–145)
Total Bilirubin: 1 mg/dL (ref 0.3–1.2)
Total Protein: 7 g/dL (ref 6.5–8.1)

## 2021-05-14 LAB — CBC WITH DIFFERENTIAL (CANCER CENTER ONLY)
Abs Immature Granulocytes: 0.02 10*3/uL (ref 0.00–0.07)
Basophils Absolute: 0.1 10*3/uL (ref 0.0–0.1)
Basophils Relative: 1 %
Eosinophils Absolute: 0.2 10*3/uL (ref 0.0–0.5)
Eosinophils Relative: 3 %
HCT: 47 % — ABNORMAL HIGH (ref 36.0–46.0)
Hemoglobin: 17.3 g/dL — ABNORMAL HIGH (ref 12.0–15.0)
Immature Granulocytes: 0 %
Lymphocytes Relative: 15 %
Lymphs Abs: 0.9 10*3/uL (ref 0.7–4.0)
MCH: 32.4 pg (ref 26.0–34.0)
MCHC: 36.8 g/dL — ABNORMAL HIGH (ref 30.0–36.0)
MCV: 88 fL (ref 80.0–100.0)
Monocytes Absolute: 0.7 10*3/uL (ref 0.1–1.0)
Monocytes Relative: 11 %
Neutro Abs: 4.3 10*3/uL (ref 1.7–7.7)
Neutrophils Relative %: 70 %
Platelet Count: 230 10*3/uL (ref 150–400)
RBC: 5.34 MIL/uL — ABNORMAL HIGH (ref 3.87–5.11)
RDW: 12.6 % (ref 11.5–15.5)
WBC Count: 6 10*3/uL (ref 4.0–10.5)
nRBC: 0 % (ref 0.0–0.2)

## 2021-05-14 MED ORDER — ANASTROZOLE 1 MG PO TABS: 1.0000 mg | ORAL_TABLET | Freq: Every day | ORAL | 4 refills | Status: DC

## 2021-05-28 ENCOUNTER — Ambulatory Visit
Admission: RE | Admit: 2021-05-28 | Discharge: 2021-05-28 | Disposition: A | Discharge status: Home / Self Care | Payer: 59 | Source: Ambulatory Visit | Attending: Radiation Oncology | Admitting: Radiation Oncology

## 2021-05-28 DIAGNOSIS — D0511 Intraductal carcinoma in situ of right breast: Secondary | ICD-10-CM | POA: Insufficient documentation

## 2021-05-28 NOTE — Progress Notes (Signed)
  Radiation Oncology         [phone] ________________________________  Name: Danielle Knapp MRN: [id number]  Date of Service: 05/28/2021  DOB: 1956-05-25  Post Treatment Telephone Note  Diagnosis:   Low to intermediate grade, ER/PR positive DCIS of the right breast  Interval Since Last Radiation:  5 weeks   03/29/2021 through 04/25/2021 Site Technique Total Dose (Gy) Dose per Fx (Gy) Completed Fx Beam Energies  Breast, Right: Breast_Rt 3D 42.56/42.56 2.66 16/16 10X, 6XFFF  Breast, Right: Breast_Rt_Bst 3D 8/8 2 4/4 6X, 10X    Narrative:  The patient was contacted today for routine follow-up. During treatment she did very well with radiotherapy and did not have significant desquamation but did develop hyperpigmentation and soreness of the skin, more notably at the conclusion and into the first week after therapy.  Impression/Plan: 1. Low to intermediate grade, ER/PR positive DCIS of the right breast. I was unable to reach the patient but left a voicemail and on the message, I discussed that we would be happy to continue to follow her as needed, for her to call back if she'd like to discuss her current status of her skin; but she will also continue to follow up with Dr. Jana Hakim in medical oncology. She was counseled on skin care as well as measures to avoid sun exposure to this area.         Carola Rhine, PAC

## 2021-06-01 ENCOUNTER — Telehealth: Payer: Self-pay | Admitting: Adult Health

## 2021-06-01 DIAGNOSIS — Z8616 Personal history of COVID-19: Secondary | ICD-10-CM

## 2021-06-01 HISTORY — DX: Personal history of COVID-19: Z86.16

## 2021-06-01 NOTE — Telephone Encounter (Signed)
Educated patient on SCP visit and what to expect.  Schedule message sent to see patient in March, 2023.

## 2021-06-04 ENCOUNTER — Telehealth: Payer: Self-pay | Admitting: Adult Health

## 2021-06-04 NOTE — Telephone Encounter (Signed)
Scheduled per sch msg. Called and left msg  

## 2021-06-05 ENCOUNTER — Telehealth: Payer: Self-pay | Admitting: Radiation Oncology

## 2021-06-05 NOTE — Telephone Encounter (Signed)
I called and spoke with the patient about her appointments and we discussed her skin is improved from the last time we saw each other. We will follow her expectantly as needed moving forward as she follows with medical oncology.

## 2021-08-07 ENCOUNTER — Inpatient Hospital Stay: Payer: 59 | Attending: Gynecologic Oncology | Admitting: Oncology

## 2021-08-07 DIAGNOSIS — Z90710 Acquired absence of both cervix and uterus: Secondary | ICD-10-CM

## 2021-08-07 DIAGNOSIS — C679 Malignant neoplasm of bladder, unspecified: Secondary | ICD-10-CM

## 2021-08-07 DIAGNOSIS — Z8042 Family history of malignant neoplasm of prostate: Secondary | ICD-10-CM

## 2021-08-07 DIAGNOSIS — I10 Essential (primary) hypertension: Secondary | ICD-10-CM

## 2021-08-07 DIAGNOSIS — D0511 Intraductal carcinoma in situ of right breast: Secondary | ICD-10-CM | POA: Diagnosis not present

## 2021-08-07 DIAGNOSIS — Z803 Family history of malignant neoplasm of breast: Secondary | ICD-10-CM

## 2021-08-07 DIAGNOSIS — Z8041 Family history of malignant neoplasm of ovary: Secondary | ICD-10-CM

## 2021-08-07 NOTE — Progress Notes (Signed)
[facility]  [phone] [phone]    ID: Danielle Knapp DOB: September 14, 1955  MR#: [id number]  XMM#:[id number]  Patient Care Team: Laurann Montana, MD as PCP - General (Family Medicine) Pershing Proud, RN as Oncology Nurse Navigator Donnelly Angelica, RN as Oncology Nurse Navigator Abigail Miyamoto, MD as Consulting Physician (General Surgery) Tesia Lybrand, Valentino Hue, MD as Consulting Physician (Oncology) Dorothy Puffer, MD as Consulting Physician (Radiation Oncology) Ardell Isaacs, Forrestine Him, MD as Consulting Physician (Obstetrics and Gynecology) Belva Agee, MD as Consulting Physician (Urology) Carver Fila, MD as Consulting Physician (Gynecologic Oncology) Kari Baars OTHER MD:  I connected with Eathel Pajak Elwell on 08/07/21 at  4:00 PM EST by video enabled telemedicine visit and verified that I am speaking with the correct person using two identifiers.   I discussed the limitations, risks, security and privacy concerns of performing an evaluation and management service by telemedicine and the availability of in-person appointments. I also discussed with the patient that there may be a patient responsible charge related to this service. The patient expressed understanding and agreed to proceed.   Other persons participating in the visit and their role in the encounter: None  Patient's location: Home Provider's location: [facility]   I provided 20 minutes of face-to-face video visit time during this encounter, and > 50% was spent counseling as documented under my assessment & plan.   CHIEF COMPLAINT: Ductal carcinoma in situ  CURRENT TREATMENT: anastrozole   INTERVAL HISTORY: Danielle "Eunice Blase" was contacted today for follow up of her noninvasive breast cancer.   She began anastrozole on 06/02/2021.  She "feels good".  The only thing she has noted is an increase in hot flashes.  They do not seem to have any pattern.  They  can come at anytime in the day or night.  They do not tend to wake her up at night however  Her most recent bone density screening on 11/10/2018 at [facility] showed a T-score of -1.7, which is considered osteopenic.   REVIEW OF SYSTEMS: Eunice Blase is exercising by walking and also goes to the gym regularly.  A detailed review of systems today was otherwise stable.   COVID 19 VACCINATION STATUS: Pfizer x3 as of June 2022; infection 05/31/2021   HISTORY OF CURRENT ILLNESS: From the original intake note:  La P Varden has a personal history of bladder cancer and endometrial cancer. She previously underwent genetic testing on 12/07/2020, and results were negative.  She had routine screening mammography on 01/03/2021 showing a possible abnormality in the right breast. She underwent right diagnostic mammography with tomography at [facility] on 01/11/2021 showing: breast density category C; 2.3 cm cluster of calcifications in upper-outer right breast.  Accordingly on 01/23/2021 she proceeded to biopsy of the right breast area in question. The pathology from this procedure ([id number]) showed: Ductal carcinoma in situ, low-grade  Prognostic indicators significant for: estrogen receptor, 100% positive and progesterone receptor, 90% positive, with strong to moderate intensity.    Cancer Staging  Ductal carcinoma in situ (DCIS) of right breast Staging form: Breast, AJCC 8th Edition - Clinical stage from 01/31/2021: Stage 0 (cTis (DCIS), cN0, cM0, ER+, PR+) - Signed by Lowella Dell, MD on 01/31/2021 Stage prefix: Initial diagnosis Nuclear grade: G1  The patient's subsequent history is as detailed below.   PAST MEDICAL HISTORY: Past Medical History:  Diagnosis Date   Allergic rhinitis    Allergy    Anxiety  Bladder cancer ([place]) 10/17/2020   Borderline glaucoma, left    no eye drops   BPPV (benign paroxysmal positional vertigo)    Breast cancer ([place]) 01/23/2021   Cancer ([place]) 10/17/2020   Cataract     Chondromalacia of patella    left   Coronary artery disease    cardiac cath 11-03-2017 done for positive ETT,  showed very miminal nonobstrutive cad, ef 55-65%, non caridac chest pain   Diverticulosis of colon    Endometrial cancer ([place])    Family history of breast cancer 12/08/2020   Family history of ovarian cancer 12/08/2020   Family history of prostate cancer 12/08/2020   Frequency of urination    GERD (gastroesophageal reflux disease)    History of chest pain 10/14/2017   ED visit w/ admission,  hypertensive urgency;  follow up with dr Doylene Canard--- had outpatient ETT 10-21-2017 positive ischemia, echo 10-16-2017 G1DD, 60-65%;    s/p cardiac cath 11-03-2017 w/ very miminal nonobstructive cad , chest pain non cardiac   History of diverticulitis    History of kidney stones    HSV-1 infection    fever blisters   Hx of adenomatous colonic polyps 02/08/2018   Hypertension    followed by pcp   MDD (major depressive disorder)    OA (osteoarthritis)    left knee, shoulder   Osteoporosis    Polycythemia    long hx followed by pcp and  sees hematology/ oncology--- dr Jerilynn Mages. Julien Nordmann on as needed basis, chronic and told to donate blood regularly   Stenosis of cervix    Uterine mass    Wears glasses     PAST SURGICAL HISTORY: Past Surgical History:  Procedure Laterality Date   ABDOMINAL HYSTERECTOMY     BREAST LUMPECTOMY WITH RADIOACTIVE SEED LOCALIZATION Right 02/16/2021   Procedure: RIGHT BREAST LUMPECTOMY WITH RADIOACTIVE SEED LOCALIZATION;  Surgeon: Coralie Keens, MD;  Location[place];  Service: General;  Laterality: Right;   CATARACT EXTRACTION W/ INTRAOCULAR LENS  IMPLANT, BILATERAL  2013   COLONOSCOPY  02-02-2018 dr Carlean Purl   CYSTOSCOPY  10/17/2020   Procedure: CYSTOSCOPY;  Surgeon: Nunzio Cobbs, MD;  Location: [facility];  Service: Gynecology;;   CYSTOSCOPY N/A 11/09/2020   Procedure: Consuela Mimes;  Surgeon: Remi Haggard, MD;  Location:  [facility];  Service: Urology;  Laterality: N/A;  Dr. Milford Cage needs to go first. [name] and rigid bx forceps.   DIAGNOSTIC LAPAROSCOPY  1996   DILATATION & CURETTAGE/HYSTEROSCOPY WITH MYOSURE N/A 10/17/2020   Procedure: DILATATION & CURETTAGE/HYSTEROSCOPY WITH  MYOSURE RESECTION OF ENDOMETRIAL MASS,  OPENING OF CERVICAL STENOSIS, DRAINAGE OF UTERINE FLUID;  Surgeon: Nunzio Cobbs, MD;  Location: [facility];  Service: Gynecology;  Laterality: N/A;   EYE SURGERY     FRACTURE SURGERY     HEEL SPUR SURGERY Right 2015   Dr. Durward Fortes   HEMORRHOID SURGERY  2004   LEFT HEART CATH AND CORONARY ANGIOGRAPHY N/A 11/03/2017   Procedure: LEFT HEART CATH AND CORONARY ANGIOGRAPHY;  Surgeon: Jettie Booze, MD;  Location: [place] [facility];  Service: Cardiovascular;  Laterality: N/A;   LYMPH NODE DISSECTION N/A 11/09/2020   Procedure: BILATERAL LYMPH NODE DISSECTION;MINI LAPAROTOMY;  Surgeon: Lafonda Mosses, MD;  Location: [facility];  Service: Gynecology;  Laterality: N/A;   OPERATIVE ULTRASOUND N/A 10/17/2020   Procedure: OPERATIVE ULTRASOUND;  Surgeon: Nunzio Cobbs, MD;  Location[facility]  Service: Gynecology;  Laterality: N/A;   ROBOTIC ASSISTED TOTAL HYSTERECTOMY WITH BILATERAL SALPINGO OOPHERECTOMY Bilateral 11/09/2020   Procedure: XI ROBOTIC ASSISTED TOTAL HYSTERECTOMY WITH BILATERAL SALPINGO OOPHORECTOMY,;  Surgeon: Carver Fila, MD;  Location: [facility];  Service: Gynecology;  Laterality: Bilateral;   SEPTOPLASTY  x2 1970's   TONSILLECTOMY  age 65   TRANSURETHRAL RESECTION OF BLADDER TUMOR N/A 11/09/2020   Procedure: TRANSURETHRAL RESECTION OF BLADDER TUMOR (TURBT);  Surgeon: Belva Agee, MD;  Location: [facility];  Service: Urology;  Laterality: N/A;    FAMILY HISTORY: Family History  Problem Relation Age of Onset   Osteoporosis Mother    Heart attack Mother    Alzheimer's disease Mother    Arthritis Mother    Depression Mother     Diabetes Father    Hypertension Father    Heart failure Father    Alcohol abuse Father    COPD Father    Ovarian cancer Sister 44   Stroke Maternal Grandmother    Prostate cancer Maternal Grandfather 74       metastatic   Cancer Maternal Grandfather    Breast cancer Other        MGM's sister; dx after 73   Vision loss Paternal Grandmother    Colon cancer Neg Hx    Colon polyps Neg Hx    Esophageal cancer Neg Hx    Rectal cancer Neg Hx    Stomach cancer Neg Hx    Endometrial cancer Neg Hx    Pancreatic cancer Neg Hx   The patient's father died at the age of 65 from a stroke.  The patient's mother died with congestive heart failure and Alzheimer's disease at age 67.  The patient had no brothers, 1 sister.  That sister has a history of ovarian cancer.   GYNECOLOGIC HISTORY:  Patient's last menstrual period was 09/03/2011. Menarche: 65 years old GX P 0 LMP 2013 Hysterectomy? Yes, 11/09/2020 for endometrial cancer BSO? yes   SOCIAL HISTORY: (updated 01/2021)  [name] "Eunice Blase" works as a Designer, television/film set for [employer].  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  The patient has no birth children but she has a stepdaughter age 54 and a stepson age 63 both living in [place].  There are no grandchildren.  The patient is a Control and instrumentation engineer.   ADVANCED DIRECTIVES: In the absence of any documents to the contrary the patient's husband is her healthcare power of attorney   HEALTH MAINTENANCE: Social History   Tobacco Use   Smoking status: Never   Smokeless tobacco: Never  Vaping Use   Vaping Use: Never used  Substance Use Topics   Alcohol use: Yes    Alcohol/week: 2.0 standard drinks    Types: 2 Glasses of wine per week    Comment: occasional wine   Drug use: Never     Colonoscopy: 01/2018 (Dr. Leone Payor), recall 2024  PAP: 09/2020, negative  Bone density: 11/2018, -1.7   Allergies   Allergen Reactions   Ciprofloxacin Hcl Anaphylaxis and Rash   Erythromycin Diarrhea and Other (See Comments)   Metronidazole Anaphylaxis, Rash and Other (See Comments)   Tolmetin Other (See Comments)    Can tolerate Ibuprofen.  Can tolerate Ibuprofen.    Lisinopril Cough   Eszopiclone Other (See Comments)    Current Outpatient Medications  Medication Sig Dispense Refill   amLODipine (NORVASC) 2.5 MG tablet Take 2.5 mg by mouth daily.  1   anastrozole (ARIMIDEX) 1 MG tablet Take 1 tablet (1 mg total) by mouth daily. Start OCT 01. 2022 90 tablet 4   buPROPion (WELLBUTRIN XL) 300 MG 24 hr tablet Take 300 mg by mouth daily.     Calcium Carb-Cholecalciferol (CALCIUM 500+D3 PO) Take 1 tablet by mouth every evening.     fluticasone (FLONASE) 50 MCG/ACT nasal spray Place 2 sprays into both nostrils daily.     loratadine (CLARITIN) 10 MG tablet Take 10 mg by mouth daily.     losartan (COZAAR) 100 MG tablet Take 100 mg by mouth daily.     Multiple Vitamin (MULTIVITAMIN WITH MINERALS) TABS tablet Take 1 tablet by mouth every evening.     sertraline (ZOLOFT) 100 MG tablet Take 150 mg by mouth daily.      traMADol (ULTRAM) 50 MG tablet Take 1 tablet (50 mg total) by mouth every 6 (six) hours as needed for moderate pain or severe pain. (Patient not taking: No sig reported) 20 tablet 0   valACYclovir (VALTREX) 1000 MG tablet Take 2,000 mg by mouth 2 (two) times daily as needed (for fever blisters).      zolpidem (AMBIEN) 10 MG tablet Take 5 mg by mouth at bedtime.     No current facility-administered medications for this visit.    OBJECTIVE: White woman in no acute distress  There were no vitals filed for this visit.     There is no height or weight on file to calculate BMI.   Wt Readings from Last 3 Encounters:  05/14/21 181 lb 12.8 oz (82.5 kg)  05/09/21 184 lb 11.2 oz (83.8 kg)  04/30/21 186 lb (84.4 kg)      ECOG FS:1 - Symptomatic but completely ambulatory  Telemedicine visit  08/07/2021     LAB RESULTS:  CMP     Component Value Date/Time   NA 141 05/14/2021 1414   NA 147 (H) 10/31/2017 0829   NA 141 08/11/2012 0935   K 4.6 05/14/2021 1414   K 3.6 08/11/2012 0935   CL 106 05/14/2021 1414   CL 105 08/11/2012 0935   CO2 28 05/14/2021 1414   CO2 26 08/11/2012 0935   GLUCOSE 95 05/14/2021 1414   GLUCOSE 115 (H) 08/11/2012 0935   BUN 13 05/14/2021 1414   BUN 18 10/31/2017 0829   BUN 18.0 08/11/2012 0935   CREATININE 0.92 05/14/2021 1414   CREATININE 0.8 08/11/2012 0935   CALCIUM 9.6 05/14/2021 1414   CALCIUM 9.9 08/11/2012 0935   PROT 7.0 05/14/2021 1414   PROT 7.4 08/11/2012 0935   ALBUMIN 4.2 05/14/2021 1414   ALBUMIN 4.1 08/11/2012 0935   AST 18 05/14/2021 1414   AST 17 08/11/2012 0935   ALT 20 05/14/2021 1414   ALT 24 08/11/2012 0935   ALKPHOS 68 05/14/2021 1414   ALKPHOS 53 08/11/2012 0935   BILITOT 1.0 05/14/2021 1414   BILITOT 0.79 08/11/2012 0935   GFRNONAA >60 05/14/2021 1414   GFRAA >60 10/25/2019 1237    No results found for: TOTALPROTELP, ALBUMINELP, A1GS, A2GS, BETS, BETA2SER, GAMS, MSPIKE, SPEI  Lab Results  Component Value Date   WBC 6.0 05/14/2021   NEUTROABS 4.3 05/14/2021   HGB 17.3 (H) 05/14/2021   HCT 47.0 (H) 05/14/2021   MCV 88.0 05/14/2021   PLT 230 05/14/2021    No results found for: LABCA2  No components found for: UMPNTI144  No results for input(s): INR in the last 168 hours.  No results found for: LABCA2  No results found for: ZOX096  No results found for: EAV409  No results found for: WJX914  No results found for: CA2729  No components found for: HGQUANT  No results found for: CEA1 / No results found for: CEA1   No results found for: AFPTUMOR  No results found for: CHROMOGRNA  No results found for: KPAFRELGTCHN, LAMBDASER, KAPLAMBRATIO (kappa/lambda light chains)  No results found for: HGBA, HGBA2QUANT, HGBFQUANT, HGBSQUAN (Hemoglobinopathy evaluation)   Lab Results  Component  Value Date   LDH 188 08/11/2012    Lab Results  Component Value Date   IRON 192 (H) 10/25/2019   TIBC 424 10/25/2019   IRONPCTSAT 45 10/25/2019   (Iron and TIBC)  Lab Results  Component Value Date   FERRITIN 39 10/25/2019    Urinalysis    Component Value Date/Time   COLORURINE STRAW (A) 11/06/2020 1409   APPEARANCEUR CLEAR 11/06/2020 1409   LABSPEC 1.005 11/06/2020 1409   PHURINE 6.0 11/06/2020 1409   GLUCOSEU NEGATIVE 11/06/2020 1409   HGBUR NEGATIVE 11/06/2020 1409   BILIRUBINUR NEGATIVE 11/06/2020 1409   BILIRUBINUR n 02/28/2016 0837   KETONESUR NEGATIVE 11/06/2020 1409   PROTEINUR NEGATIVE 11/06/2020 1409   UROBILINOGEN negative 02/28/2016 0837   NITRITE NEGATIVE 11/06/2020 1409   LEUKOCYTESUR NEGATIVE 11/06/2020 1409    STUDIES: No results found.   ELIGIBLE FOR AVAILABLE RESEARCH PROTOCOL: no  ASSESSMENT: 65 y.o. [place] woman with  (1) status post total abdominal hysterectomy with bilateral salpingo-oophorectomy 11/09/2020 for an endometrioid carcinoma, pT1a, pN0, FIGO grade 2  (a) no involvement of the uterine serosa or cervical stroma or adnexa,  (b) a total of 4 left and 5 right pelvic lymph nodes removed  (c) loss of MLH1 and PMS2 nuclear expression  (2) low-grade noninvasive papillary urothelial cancer biopsied 11/09/2020 (followed by [facility] Cage)  (3) genetics testing 12/24/2020 through the [facility] Common Hereditary Cancers +RNA Panel found no deleterious mutations in APC, ATM, AXIN2, BARD1, BMPR1A, BRCA1, BRCA2, BRIP1, CDH1, CDK4*, CDKN2A (p14ARF)*, CDKN2A (p16INK4a)*, CHEK2, CTNNA1, DICER1, EPCAM (Deletion/duplication testing only), GREM1 (promoter region deletion/duplication testing only), KIT, MEN1, MLH1, MSH2, MSH3, MSH6, MUTYH, NBN, NF1, NHTL1, PALB2, PDGFRA*, PMS2, POLD1, POLE, PTEN, RAD50, RAD51C, RAD51D, SDHB, SDHC, SDHD, SMAD4, SMARCA4. STK11, TP53, TSC1, TSC2, and VHL.  The following genes were evaluated for sequence changes only: SDHA and  HOXB13 c.251G>A variant only.  RNA analysis is not performed for the * genes.   (4) status post right breast biopsy 01/23/2021 for ductal carcinoma in situ, low-grade, estrogen and progesterone receptor positive  (5) status post right lumpectomy 02/16/2021 for ductal carcinoma in situ, grade 1 / 2, with negative margins.  (6) adjuvant radiation 03/29/2021 through 04/25/2021 Site Technique Total Dose (Gy) Dose per Fx (Gy) Completed Fx Beam Energies  Breast, Right: Breast_Rt 3D 42.56/42.56 2.66 16/16 10X, 6XFFF  Breast, Right: Breast_Rt_Bst 3D 8/8 2 4/4 6X, 10X   (7) started anastrozole 06/02/2021  (A) status post hysterectomy March 2022  (B) DEXA scan 11/10/2018 at [facility] showed a T score of -1.7  (8 polycythemia: Hb >16.0 in non-smoking woman  (A) new generation sequencing was negative for JAK2, MPL and CAL R as well as other associated genes.  (B) CT of the abdomen and pelvis 11/30/2020 shows no renal or adrenal mass  (C) erythropoietin on 08/11/2012 was 6.4, hemoglobin same day was 16.2   PLAN: Psalm is tolerating anastrozole well so far and the plan will be to continue that a minimum of 5 years.  She understands that we  are now starting a new mammography series, so her new baseline mammogram can be scheduled at any point.  After some discussion we picked March so she will have bilateral diagnostic mammography March 2023 and we will see her in April with labs the same day.  She knows to call for any other issue that may develop before the next visit.   Virgie Dad. Lamees Gable, MD 08/07/2021 11:43 AM Medical Oncology and Hematology [facility] [address] [place], [place] [address] Tel. [phone]    Fax. [phone]   This document serves as a record of services personally performed by Lurline Del, MD. It was created on his behalf by Wilburn Mylar, a trained medical scribe. The creation of this record is based on the scribe's personal observations and the  provider's statements to them.   I, Lurline Del MD, have reviewed the above documentation for accuracy and completeness, and I agree with the above.   *Total Encounter Time as defined by the Centers for Medicare and Medicaid Services includes, in addition to the face-to-face time of a patient visit (documented in the note above) non-face-to-face time: obtaining and reviewing outside history, ordering and reviewing medications, tests or procedures, care coordination (communications with other health care professionals or caregivers) and documentation in the medical record.

## 2021-08-10 ENCOUNTER — Ambulatory Visit: Payer: 59 | Admitting: Obstetrics and Gynecology

## 2021-08-17 ENCOUNTER — Encounter: Payer: Self-pay | Admitting: Obstetrics and Gynecology

## 2021-08-17 ENCOUNTER — Ambulatory Visit (INDEPENDENT_AMBULATORY_CARE_PROVIDER_SITE_OTHER): Payer: 59 | Admitting: Obstetrics and Gynecology

## 2021-08-17 ENCOUNTER — Other Ambulatory Visit: Payer: Self-pay

## 2021-08-17 VITALS — BP 140/84 | HR 71 | Ht 65.5 in | Wt 183.0 lb

## 2021-08-17 DIAGNOSIS — Z01419 Encounter for gynecological examination (general) (routine) without abnormal findings: Secondary | ICD-10-CM

## 2021-08-17 MED ORDER — VALACYCLOVIR HCL 1 G PO TABS: 2000.0000 mg | ORAL_TABLET | Freq: Two times a day (BID) | ORAL | 1 refills | Status: DC | PRN

## 2021-08-17 NOTE — Progress Notes (Signed)
65 y.o. G80P0000 Married Caucasian female here for annual exam.    Diagnosed with 3 cancers this year - uterine, bladder, and breast. She has right breast sensitivity, supraumbilical tenderness along her incision, and urinary frequency.  No vaginal bleeding, blood in stool, or blood in urine.   Having oral HSV.   Takes Valtrex as needed.   PCP:  Harlan Stains MD  Patient's last menstrual period was 09/03/2011.           Sexually active: No. Husband with Prostate cancer The current method of family planning is status post hysterectomy.    Exercising: Yes.     Walking and walks up and down stairs Smoker:  no  Health Maintenance: Pap:  09-27-20 Neg:Neg HR HPV, 03-20-18 Neg:Neg HR HPV,02-21-15 Neg:Neg HR HPV History of abnormal Pap:  yes, 2013 had colposcopy with Dr. Felicita Gage neg.  No treatment to cervix MMG:  01-11-21--SEE EPIC Colonoscopy:  02-02-18;Next due 2024 BMD:   11-10-18  Result :Osteopenia multiple sites.  PCP following.  TDaP:  2022.  Gardasil:   no HIV: 10-15-17 NR Hep C: donates blood Screening Labs:  PCP and oncology.  Flu vaccine:  completed.  Covid booster in January, 2023.   reports that she has never smoked. She has never used smokeless tobacco. She reports current alcohol use of about 2.0 standard drinks per week. She reports that she does not use drugs.  Past Medical History:  Diagnosis Date   Allergic rhinitis    Allergy    Anxiety    Bladder cancer ([place]) 10/17/2020   Borderline glaucoma, left    no eye drops   BPPV (benign paroxysmal positional vertigo)    Breast cancer ([place]) 01/23/2021   Cancer ([place]) 10/17/2020   Cataract    Chondromalacia of patella    left   Coronary artery disease    cardiac cath 11-03-2017 done for positive ETT,  showed very miminal nonobstrutive cad, ef 55-65%, non caridac chest pain   Diverticulosis of colon    Endometrial cancer ([place])    Family history of breast cancer 12/08/2020   Family history of ovarian cancer  12/08/2020   Family history of prostate cancer 12/08/2020   Frequency of urination    GERD (gastroesophageal reflux disease)    History of chest pain 10/14/2017   ED visit w/ admission,  hypertensive urgency;  follow up with dr Doylene Canard--- had outpatient ETT 10-21-2017 positive ischemia, echo 10-16-2017 G1DD, 60-65%;    s/p cardiac cath 11-03-2017 w/ very miminal nonobstructive cad , chest pain non cardiac   History of COVID-19 06/01/2021   History of diverticulitis    History of kidney stones    HSV-1 infection    fever blisters   Hx of adenomatous colonic polyps 02/08/2018   Hypertension    followed by pcp   MDD (major depressive disorder)    OA (osteoarthritis)    left knee, shoulder   Osteoporosis    Polycythemia    long hx followed by pcp and  sees hematology/ oncology--- dr Jerilynn Mages. Julien Nordmann on as needed basis, chronic and told to donate blood regularly   Stenosis of cervix    Uterine mass    Wears glasses     Past Surgical History:  Procedure Laterality Date   ABDOMINAL HYSTERECTOMY     BREAST LUMPECTOMY WITH RADIOACTIVE SEED LOCALIZATION Right 02/16/2021   Procedure: RIGHT BREAST LUMPECTOMY WITH RADIOACTIVE SEED LOCALIZATION;  Surgeon: Coralie Keens, MD;  Location: [facility];  Service: General;  Laterality:  Right;   CATARACT EXTRACTION W/ INTRAOCULAR LENS  IMPLANT, BILATERAL  2013   COLONOSCOPY  02-02-2018 dr Carlean Purl   CYSTOSCOPY  10/17/2020   Procedure: CYSTOSCOPY;  Surgeon: Nunzio Cobbs, MD;  Location: [facility];  Service: Gynecology;;   CYSTOSCOPY N/A 11/09/2020   Procedure: Consuela Mimes;  Surgeon: Remi Haggard, MD;  Location: [facility];  Service: Urology;  Laterality: N/A;  Dr. Milford Cage needs to go first. [name] and rigid bx forceps.   DIAGNOSTIC LAPAROSCOPY  1996   DILATATION & CURETTAGE/HYSTEROSCOPY WITH MYOSURE N/A 10/17/2020   Procedure: DILATATION & CURETTAGE/HYSTEROSCOPY WITH  MYOSURE RESECTION OF ENDOMETRIAL MASS,  OPENING  OF CERVICAL STENOSIS, DRAINAGE OF UTERINE FLUID;  Surgeon: Nunzio Cobbs, MD;  Location: [facility];  Service: Gynecology;  Laterality: N/A;   EYE SURGERY     FRACTURE SURGERY     HEEL SPUR SURGERY Right 2015   Dr. Durward Fortes   HEMORRHOID SURGERY  2004   LEFT HEART CATH AND CORONARY ANGIOGRAPHY N/A 11/03/2017   Procedure: LEFT HEART CATH AND CORONARY ANGIOGRAPHY;  Surgeon: Jettie Booze, MD;  Location: [facility];  Service: Cardiovascular;  Laterality: N/A;   LYMPH NODE DISSECTION N/A 11/09/2020   Procedure: BILATERAL LYMPH NODE DISSECTION;MINI LAPAROTOMY;  Surgeon: Lafonda Mosses, MD;  Location: [facility];  Service: Gynecology;  Laterality: N/A;   OPERATIVE ULTRASOUND N/A 10/17/2020   Procedure: OPERATIVE ULTRASOUND;  Surgeon: Nunzio Cobbs, MD;  Location: [facility];  Service: Gynecology;  Laterality: N/A;   ROBOTIC ASSISTED TOTAL HYSTERECTOMY WITH BILATERAL SALPINGO OOPHERECTOMY Bilateral 11/09/2020   Procedure: XI ROBOTIC ASSISTED TOTAL HYSTERECTOMY WITH BILATERAL SALPINGO OOPHORECTOMY,;  Surgeon: Lafonda Mosses, MD;  Location: [facility];  Service: Gynecology;  Laterality: Bilateral;   SEPTOPLASTY  x2 1970's   TONSILLECTOMY  age 50   TRANSURETHRAL RESECTION OF BLADDER TUMOR N/A 11/09/2020   Procedure: TRANSURETHRAL RESECTION OF BLADDER TUMOR (TURBT);  Surgeon: Remi Haggard, MD;  Location: [facility];  Service: Urology;  Laterality: N/A;    Current Outpatient Medications  Medication Sig Dispense Refill   amLODipine (NORVASC) 2.5 MG tablet Take 2.5 mg by mouth daily.  1   anastrozole (ARIMIDEX) 1 MG tablet Take 1 tablet (1 mg total) by mouth daily. Start OCT 01. 2022 90 tablet 4   buPROPion (WELLBUTRIN XL) 300 MG 24 hr tablet Take 300 mg by mouth daily.     Calcium Carb-Cholecalciferol (CALCIUM 500+D3 PO) Take 1 tablet by mouth every evening.     fluticasone (FLONASE) 50 MCG/ACT nasal spray Place 2 sprays into both  nostrils daily.     loratadine (CLARITIN) 10 MG tablet Take 10 mg by mouth daily.     losartan (COZAAR) 100 MG tablet Take 100 mg by mouth daily.     Multiple Vitamin (MULTIVITAMIN WITH MINERALS) TABS tablet Take 1 tablet by mouth every evening.     sertraline (ZOLOFT) 100 MG tablet Take 150 mg by mouth daily.      valACYclovir (VALTREX) 1000 MG tablet Take 2,000 mg by mouth 2 (two) times daily as needed (for fever blisters).      zolpidem (AMBIEN) 10 MG tablet 1/2-1 tablet at bedtime as needed     No current facility-administered medications for this visit.    Family History  Problem Relation Age of Onset   Osteoporosis Mother    Heart attack Mother    Alzheimer's disease Mother    Arthritis Mother  Depression Mother    Diabetes Father    Hypertension Father    Heart failure Father    Alcohol abuse Father    COPD Father    Ovarian cancer Sister 58   Stroke Maternal Grandmother    Prostate cancer Maternal Grandfather 45       metastatic   Cancer Maternal Grandfather    Breast cancer Other        MGM's sister; dx after 43   Vision loss Paternal Grandmother    Colon cancer Neg Hx    Colon polyps Neg Hx    Esophageal cancer Neg Hx    Rectal cancer Neg Hx    Stomach cancer Neg Hx    Endometrial cancer Neg Hx    Pancreatic cancer Neg Hx     Review of Systems  All other systems reviewed and are negative.  Exam:   BP 140/84    Pulse 71    Ht 5' 5.5" (1.664 m)    Wt 183 lb (83 kg)    LMP 09/03/2011    SpO2 98%    BMI 29.99 kg/m     General appearance: alert, cooperative and appears stated age Head: normocephalic, without obvious abnormality, atraumatic Neck: no adenopathy, supple, symmetrical, trachea midline and thyroid normal to inspection and palpation Lungs: clear to auscultation bilaterally Breasts: right breast with erythema of skin and and some nipple induration,  normal appearance, no masses or tenderness, No nipple retraction or dimpling, No nipple discharge or  bleeding, No axillary adenopathy Left breast - normal appearance, no masses or tenderness, No nipple retraction or dimpling, No nipple discharge or bleeding, No axillary adenopathy Heart: regular rate and rhythm Abdomen: supraumbilical incision - no mass.  Abdomen is soft, non-tender; no masses, no organomegaly Extremities: extremities normal, atraumatic, no cyanosis or edema Skin: skin color, texture, turgor normal. No rashes or lesions Lymph nodes: cervical, supraclavicular, and axillary nodes normal. Neurologic: grossly normal  Pelvic: External genitalia:  no lesions              No abnormal inguinal nodes palpated.              Urethra:  normal appearing urethra with no masses, tenderness or lesions              Bartholins and Skenes: normal                 Vagina: normal appearing vagina with normal color and discharge, no lesions              Cervix: absent              Pap taken: no Bimanual Exam:  Uterus:  absent              Adnexa: no mass, fullness, tenderness              Rectal exam: yes.  Confirms.              Anus:  normal sphincter tone, no lesions  Chaperone was present for exam:  Estill Bamberg, CMA  Assessment:   Well woman visit with gynecologic exam. Status post robotic hysterectomy with BSO, stage IA, grade 2.  Doing visits every 6 months for 5 tears.  Status post vaginal cuff biopsy 05/09/21 - benign granulation tissue.  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  DCIS right breast. Status post lumpectomy and XRT. Negative genetic testing.  Osteopenia.  HSV I.   Plan: Dx mammogram in March.  Self breast awareness reviewed. Guidelines for Calcium, Vitamin D, regular exercise program including cardiovascular and weight bearing exercise. Refill for Valtrex.   BMD due.  She may try to coordinate this with her mammogram in March, 2023.  Follow up annually and prn.   After visit summary provided.

## 2021-08-17 NOTE — Patient Instructions (Signed)

## 2021-09-05 ENCOUNTER — Ambulatory Visit: Payer: 59 | Admitting: Obstetrics and Gynecology

## 2021-10-22 ENCOUNTER — Telehealth: Payer: Self-pay | Admitting: Adult Health

## 2021-10-22 DIAGNOSIS — H524 Presbyopia: Secondary | ICD-10-CM | POA: Diagnosis not present

## 2021-10-22 DIAGNOSIS — H52203 Unspecified astigmatism, bilateral: Secondary | ICD-10-CM | POA: Diagnosis not present

## 2021-10-22 DIAGNOSIS — Z961 Presence of intraocular lens: Secondary | ICD-10-CM | POA: Diagnosis not present

## 2021-10-22 NOTE — Telephone Encounter (Signed)
Called patient to update her on the changes made to her appointment. Patient was scheduled for Monday 3/13 @ 10:15. Patient was rescheduled for Wednesday 3/15 @ 10:15. Left message. Patient will be mailed an updated calendar.

## 2021-11-12 ENCOUNTER — Encounter: Payer: 59 | Admitting: Adult Health

## 2021-11-14 ENCOUNTER — Other Ambulatory Visit: Payer: Self-pay

## 2021-11-14 ENCOUNTER — Ambulatory Visit (HOSPITAL_COMMUNITY)
Admission: RE | Admit: 2021-11-14 | Discharge: 2021-11-14 | Disposition: A | Discharge status: Home / Self Care | Payer: HMO | Source: Ambulatory Visit | Attending: Adult Health | Admitting: Adult Health

## 2021-11-14 ENCOUNTER — Encounter: Payer: Self-pay | Admitting: Adult Health

## 2021-11-14 ENCOUNTER — Inpatient Hospital Stay: Payer: HMO | Attending: Gynecologic Oncology | Admitting: Adult Health

## 2021-11-14 VITALS — BP 138/80 | HR 73 | Temp 98.6°F | Resp 18 | Ht 65.5 in | Wt 187.1 lb

## 2021-11-14 DIAGNOSIS — R053 Chronic cough: Secondary | ICD-10-CM | POA: Insufficient documentation

## 2021-11-14 DIAGNOSIS — Z8542 Personal history of malignant neoplasm of other parts of uterus: Secondary | ICD-10-CM | POA: Insufficient documentation

## 2021-11-14 DIAGNOSIS — Z8041 Family history of malignant neoplasm of ovary: Secondary | ICD-10-CM | POA: Insufficient documentation

## 2021-11-14 DIAGNOSIS — Z90710 Acquired absence of both cervix and uterus: Secondary | ICD-10-CM | POA: Diagnosis not present

## 2021-11-14 DIAGNOSIS — D0511 Intraductal carcinoma in situ of right breast: Secondary | ICD-10-CM

## 2021-11-14 DIAGNOSIS — Z8042 Family history of malignant neoplasm of prostate: Secondary | ICD-10-CM | POA: Insufficient documentation

## 2021-11-14 DIAGNOSIS — Z803 Family history of malignant neoplasm of breast: Secondary | ICD-10-CM | POA: Diagnosis not present

## 2021-11-14 DIAGNOSIS — E2839 Other primary ovarian failure: Secondary | ICD-10-CM | POA: Insufficient documentation

## 2021-11-14 DIAGNOSIS — R059 Cough, unspecified: Secondary | ICD-10-CM | POA: Diagnosis not present

## 2021-11-14 NOTE — Progress Notes (Signed)
SURVIVORSHIP VISIT: ? ?BRIEF ONCOLOGIC HISTORY:  ?Oncology History  ?Endometrial cancer ([place])  ? Initial Diagnosis  ? Endometrial cancer [facility]) ?  ?09/27/2019 Imaging  ? CT A/P: ?1. Large hypoenhancing mass in the region of the lower uterus/upper ?cervix. This appears to expand the endometrial canal the lower ?uterine segment although the endometrial canal in the uterine fundus ?is nondilated. As such, endometrial canal/cervical stricture is not ?considered likely. Rather, imaging features are highly concerning ?for neoplasm, likely of endometrial origin. ?2. No findings of metastatic disease. Specifically, no evidence for ?lymphadenopathy in the abdomen/pelvis. No peritoneal or omental ?nodularity. No ascites. ?3. Left colonic diverticulosis without diverticulitis ?  ?10/09/2020 Imaging  ? MRI pelvis: ?Distended endocervical canal, as described above, favoring a lower ?cervical stenosis/stricture. ?  ?Mild associated polypoid soft tissue/debris in the endometrial ?cavity and along the lower cervix, poorly evaluated on unenhanced ?MR. While the endometrial soft tissue favors debris, consider ?hysteroscopy to exclude a cervical mass resulting in ?Stenosis/stricture. ?  ?10/17/2020 Initial Biopsy  ? EMB: ?A. ENDOMETRIUM MASS, RESECTION:  ?- Endometrioid adenocarcinoma.  ?- See comment.  ? ?B. ENDOMETRIUM, CURETTAGE:  ?- Blood and scanty benign endocervical mucosa.  ?- No endometrial tissue identified.  ? ?COMMENT:  ? ?A. The findings are consistent with FIGO grade 1/2. ?  ?11/09/2020 Imaging  ? Cystoscopy and biopsy with fulguration of papillary lesions (Dr. Milford Cage), Robotic-assisted laparoscopic total hysterectomy with bilateral salpingoophorectomy, bilateral pelvic LND, mini-lap for specimen removal ? ?Findings: On EUA, cervix difficult to distinguish, flush with apex; uterus small and mobile. Blood and some tumor noted on D&C. On intra-abdominal entry, normal upper abdominal exam including liver edge, diaphragm,  stomach, small and large bowel, omentum. Bilateral ovaries atrophic appearing. Uterus 6-8cm and normal appearing. Mapping unsuccessful to bilateral pelvic basins. Significant retroperitoneal fibrosis noted and significant adhesions between the bladder and cervix. Several mildly prominent pelvic lymph nodes, no frank tumor involvement. No intra-abdominal or pelvic disease noted at the end of surgery. ?  ?11/09/2020 Pathology Results  ? A. BLADDER TUMOR, BIOPSY:  ?- Low grade papillary urothelial carcinoma.  ?- No distinct lamina propria invasion identified.  ?- Muscularis propria is present and not involved.  ? ?B. CERVIX, RIGHT POSTERIOR, BIOPSY:  ?- Fibromuscular tissue devoid of epithelium, negative for carcinoma.  ? ?C. CERVIX, LEFT POSTERIOR, BIOPSY:  ?- Fibromuscular tissue with endocervical glandular type epithelium,  ?negative for carcinoma.  ? ?D. LYMPH NODES, RIGHT PELVIC, EXCISION:  ?- Five lymph nodes, negative for carcinoma (0/5).  ? ?E. LYMPH NODES, LEFT PELVIC, EXCISION:  ?- Four lymph nodes, negative for carcinoma (0/4).  ? ?F. UTERUS, CERVIX AND BILATERAL FALLOPIAN TUBES AND OVARIES, TOTAL  ?HYSTERECTOMY AND BILATERAL SALPINGO-OOPHORECTOMY:  ?- Endometrioid carcinoma, FIGO grade 2, with invasion less than half the  ?myometrium.  ?- No involvement of uterine serosa, cervical stroma or adnexa.  ?- See oncology table.  ? ?ONCOLOGY TABLE:  ? ?UTERUS, CARCINOMA OR CARCINOSARCOMA: Resection  ? ?Procedure: Total hysterectomy and bilateral salpingo-oophorectomy,  ?Excision of pelvic lymph nodes  ?Histologic Type: Endometrioid carcinoma  ?Histologic Grade: FIGO grade 2  ?Myometrial Invasion:  ?     Depth of Myometrial Invasion: 2 mm  ?     Myometrial Thickness: 7 mm  ?     Percentage of Myometrial Invasion: < 50%  ?Uterine Serosa Involvement: Not identified  ?Cervical Stroma Involvement: Not identified  ?Other Tissue/Organ Involvement: Not identified  ?Peritoneal/Ascitic Fluid: Not submitted/unknown   ?Lymphovascular Invasion: Not identified  ?Regional Lymph Nodes:  ?  Pelvic Lymph Nodes Examined:  ?          0 Sentinel  ?          9 Non-sentinel  ?          9 Total  ?     Pelvic Lymph Nodes with Metastasis: 0  ? ?     Para-aortic Lymph Nodes Examined:  ?          0 Sentinel  ?          0 Non-sentinel  ?          0 Total  ?Distant Metastasis:  ?     Distant Site(s) Involved: Not applicable  ?Pathologic Stage Classification (pTNM, AJCC 8th Edition): pT1a, pN0  ?Ancillary Studies: MMR/MSI testing will be ordered  ?Representative Tumor Block: F4  ?(v4.2.0.1)  ?  ?12/24/2020 Genetic Testing  ? Negative hereditary cancer genetic testing: no pathogenic variants detected in [facility] Common Hereditary Cancers +RNA Panel.  The report date is December 24, 2020.  The Common Hereditary Cancers + RNA Panel offered by [facility] includes sequencing, deletion/duplication, and RNA testing of the following 47 genes: APC, ATM, AXIN2, BARD1, BMPR1A, BRCA1, BRCA2, BRIP1, CDH1, CDK4*, CDKN2A (p14ARF)*, CDKN2A (p16INK4a)*, CHEK2, CTNNA1, DICER1, EPCAM (Deletion/duplication testing only), GREM1 (promoter region deletion/duplication testing only), KIT, MEN1, MLH1, MSH2, MSH3, MSH6, MUTYH, NBN, NF1, NHTL1, PALB2, PDGFRA*, PMS2, POLD1, POLE, PTEN, RAD50, RAD51C, RAD51D, SDHB, SDHC, SDHD, SMAD4, SMARCA4. STK11, TP53, TSC1, TSC2, and VHL.  The following genes were evaluated for sequence changes only: SDHA and HOXB13 c.251G>A variant only.  RNA analysis is not performed for the * genes.   ?  ?Ductal carcinoma in situ (DCIS) of right breast  ?01/30/2021 Initial Diagnosis  ? Ductal carcinoma in situ (DCIS) of right breast ?  ?01/31/2021 Cancer Staging  ? Staging form: Breast, AJCC 8th Edition ?- Clinical stage from 01/31/2021: Stage 0 (cTis (DCIS), cN0, cM0, ER+, PR+) - Signed by Chauncey Cruel, MD on 01/31/2021 ?Stage prefix: Initial diagnosis ?Nuclear grade: G1 ? ?  ?02/16/2021 Definitive Surgery  ? FINAL MICROSCOPIC DIAGNOSIS:  ? ?A. BREAST, RIGHT,  LUMPECTOMY:  ?- Intermediate and low grade ductal carcinoma in situ with calcifications.  ?- Atypical lobular hyperplasia.  ?- Biopsy site.  ?- Fibrocystic change with calcifications.  ?- Complex sclerosing lesion.  ?- Intraductal papilloma.  ?- See oncology table.  ?  ?03/29/2021 - 04/25/2021 Radiation Therapy  ? Site Technique Total Dose (Gy) Dose per Fx (Gy) Completed Fx Beam Energies  ?Breast, Right: Breast_Rt 3D 42.56/42.56 2.66 16/16 10X, 6XFFF  ?Breast, Right: Breast_Rt_Bst 3D 8/8 2 4/4 6X, 10X  ? ?  ?06/02/2021 -  Anti-estrogen oral therapy  ? Anastrozole ?  ? ? ?INTERVAL HISTORY:  ?Danielle Knapp to review her survivorship care plan detailing her treatment course for breast cancer, as well as monitoring long-term side effects of that treatment, education regarding health maintenance, screening, and overall wellness and health promotion.    ? ?Overall, Danielle Knapp reports feeling quite well.  Her biggest issue is the fact that she has hot flashes that are keeping her up at night.  She also is experiencing some fatigue related to this.  Over her right breast she notes some tenderness and stiffness in the right shoulder.  She denies any other significant issues. ? ?REVIEW OF SYSTEMS:  ?Review of Systems  ?Constitutional:  Positive for fatigue. Negative for appetite change, chills, fever and unexpected weight change.  ?HENT:   Negative for hearing  loss, lump/mass and trouble swallowing.   ?Eyes:  Negative for eye problems and icterus.  ?Respiratory:  Negative for chest tightness, cough and shortness of breath.   ?Cardiovascular:  Negative for chest pain, leg swelling and palpitations.  ?Gastrointestinal:  Negative for abdominal distention, abdominal pain, constipation, diarrhea, nausea and vomiting.  ?Endocrine: Positive for hot flashes.  ?Genitourinary:  Negative for difficulty urinating.   ?Musculoskeletal:  Negative for arthralgias.  ?Skin:  Negative for itching and rash.  ?Neurological:  Negative for dizziness,  extremity weakness, headaches and numbness.  ?Hematological:  Negative for adenopathy. Does not bruise/bleed easily.  ?Psychiatric/Behavioral:  Negative for depression. The patient is not nervous/anxious.   ?Breast: Danielle Knapp

## 2021-11-16 ENCOUNTER — Telehealth: Payer: Self-pay

## 2021-11-16 NOTE — Telephone Encounter (Signed)
Called and spoke with pt, informed her, per [facility], chest xray negative.  Pt verbalized understanding and thanks  ?

## 2021-11-16 NOTE — Telephone Encounter (Signed)
-----   Message from Gardenia Phlegm, NP sent at 11/16/2021  4:12 PM EDT ----- ?Cxr neg. Please tell patient.  It took a hot minute for this to result so I am sure she might be worried.  ? ?----- Message ----- ?From: Interface, Rad Results In ?Sent: 11/16/2021   3:43 PM EDT ?To: Gardenia Phlegm, NP ? ? ?

## 2021-11-29 DIAGNOSIS — C672 Malignant neoplasm of lateral wall of bladder: Secondary | ICD-10-CM | POA: Diagnosis not present

## 2021-11-30 ENCOUNTER — Encounter: Payer: Self-pay | Admitting: Obstetrics and Gynecology

## 2021-11-30 DIAGNOSIS — Z853 Personal history of malignant neoplasm of breast: Secondary | ICD-10-CM | POA: Diagnosis not present

## 2021-12-05 NOTE — Progress Notes (Signed)
? ?Patient Care Team: ?Harlan Stains, MD as PCP - General (Family Medicine) ?Coralie Keens, MD as Consulting Physician (General Surgery) ?Kyung Rudd, MD as Consulting Physician (Radiation Oncology) ?Nunzio Cobbs, MD as Consulting Physician (Obstetrics and Gynecology) ?Remi Haggard, MD as Consulting Physician (Urology) ?Lafonda Mosses, MD as Consulting Physician (Gynecologic Oncology) ? ?DIAGNOSIS:  ?Encounter Diagnoses  ?Name Primary?  ? Ductal carcinoma in situ (DCIS) of right breast   ? Polycythemia, secondary Yes  ? ? ?SUMMARY OF ONCOLOGIC HISTORY: ?Oncology History  ?Endometrial cancer ([place])  ? Initial Diagnosis  ? Endometrial cancer [facility]) ?  ?09/27/2019 Imaging  ? CT A/P: ?1. Large hypoenhancing mass in the region of the lower uterus/upper ?cervix. This appears to expand the endometrial canal the lower ?uterine segment although the endometrial canal in the uterine fundus ?is nondilated. As such, endometrial canal/cervical stricture is not ?considered likely. Rather, imaging features are highly concerning ?for neoplasm, likely of endometrial origin. ?2. No findings of metastatic disease. Specifically, no evidence for ?lymphadenopathy in the abdomen/pelvis. No peritoneal or omental ?nodularity. No ascites. ?3. Left colonic diverticulosis without diverticulitis ?  ?10/09/2020 Imaging  ? MRI pelvis: ?Distended endocervical canal, as described above, favoring a lower ?cervical stenosis/stricture. ?  ?Mild associated polypoid soft tissue/debris in the endometrial ?cavity and along the lower cervix, poorly evaluated on unenhanced ?MR. While the endometrial soft tissue favors debris, consider ?hysteroscopy to exclude a cervical mass resulting in ?Stenosis/stricture. ?  ?10/17/2020 Initial Biopsy  ? EMB: ?A. ENDOMETRIUM MASS, RESECTION:  ?- Endometrioid adenocarcinoma.  ?- See comment.  ? ?B. ENDOMETRIUM, CURETTAGE:  ?- Blood and scanty benign endocervical mucosa.  ?- No endometrial tissue  identified.  ? ?COMMENT:  ? ?A. The findings are consistent with FIGO grade 1/2. ?  ?11/09/2020 Imaging  ? Cystoscopy and biopsy with fulguration of papillary lesions (Dr. Milford Cage), Robotic-assisted laparoscopic total hysterectomy with bilateral salpingoophorectomy, bilateral pelvic LND, mini-lap for specimen removal ? ?Findings: On EUA, cervix difficult to distinguish, flush with apex; uterus small and mobile. Blood and some tumor noted on D&C. On intra-abdominal entry, normal upper abdominal exam including liver edge, diaphragm, stomach, small and large bowel, omentum. Bilateral ovaries atrophic appearing. Uterus 6-8cm and normal appearing. Mapping unsuccessful to bilateral pelvic basins. Significant retroperitoneal fibrosis noted and significant adhesions between the bladder and cervix. Several mildly prominent pelvic lymph nodes, no frank tumor involvement. No intra-abdominal or pelvic disease noted at the end of surgery. ?  ?11/09/2020 Pathology Results  ? A. BLADDER TUMOR, BIOPSY:  ?- Low grade papillary urothelial carcinoma.  ?- No distinct lamina propria invasion identified.  ?- Muscularis propria is present and not involved.  ? ?B. CERVIX, RIGHT POSTERIOR, BIOPSY:  ?- Fibromuscular tissue devoid of epithelium, negative for carcinoma.  ? ?C. CERVIX, LEFT POSTERIOR, BIOPSY:  ?- Fibromuscular tissue with endocervical glandular type epithelium,  ?negative for carcinoma.  ? ?D. LYMPH NODES, RIGHT PELVIC, EXCISION:  ?- Five lymph nodes, negative for carcinoma (0/5).  ? ?E. LYMPH NODES, LEFT PELVIC, EXCISION:  ?- Four lymph nodes, negative for carcinoma (0/4).  ? ?F. UTERUS, CERVIX AND BILATERAL FALLOPIAN TUBES AND OVARIES, TOTAL  ?HYSTERECTOMY AND BILATERAL SALPINGO-OOPHORECTOMY:  ?- Endometrioid carcinoma, FIGO grade 2, with invasion less than half the  ?myometrium.  ?- No involvement of uterine serosa, cervical stroma or adnexa.  ?- See oncology table.  ? ?ONCOLOGY TABLE:  ? ?UTERUS, CARCINOMA OR CARCINOSARCOMA:  Resection  ? ?Procedure: Total hysterectomy and bilateral salpingo-oophorectomy,  ?Excision of pelvic lymph  nodes  ?Histologic Type: Endometrioid carcinoma  ?Histologic Grade: FIGO grade 2  ?Myometrial Invasion:  ?     Depth of Myometrial Invasion: 2 mm  ?     Myometrial Thickness: 7 mm  ?     Percentage of Myometrial Invasion: < 50%  ?Uterine Serosa Involvement: Not identified  ?Cervical Stroma Involvement: Not identified  ?Other Tissue/Organ Involvement: Not identified  ?Peritoneal/Ascitic Fluid: Not submitted/unknown  ?Lymphovascular Invasion: Not identified  ?Regional Lymph Nodes:  ?     Pelvic Lymph Nodes Examined:  ?          0 Sentinel  ?          9 Non-sentinel  ?          9 Total  ?     Pelvic Lymph Nodes with Metastasis: 0  ? ?     Para-aortic Lymph Nodes Examined:  ?          0 Sentinel  ?          0 Non-sentinel  ?          0 Total  ?Distant Metastasis:  ?     Distant Site(s) Involved: Not applicable  ?Pathologic Stage Classification (pTNM, AJCC 8th Edition): pT1a, pN0  ?Ancillary Studies: MMR/MSI testing will be ordered  ?Representative Tumor Block: F4  ?(v4.2.0.1)  ?  ?12/24/2020 Genetic Testing  ? Negative hereditary cancer genetic testing: no pathogenic variants detected in [facility] Common Hereditary Cancers +RNA Panel.  The report date is December 24, 2020.  The Common Hereditary Cancers + RNA Panel offered by [facility] includes sequencing, deletion/duplication, and RNA testing of the following 47 genes: APC, ATM, AXIN2, BARD1, BMPR1A, BRCA1, BRCA2, BRIP1, CDH1, CDK4*, CDKN2A (p14ARF)*, CDKN2A (p16INK4a)*, CHEK2, CTNNA1, DICER1, EPCAM (Deletion/duplication testing only), GREM1 (promoter region deletion/duplication testing only), KIT, MEN1, MLH1, MSH2, MSH3, MSH6, MUTYH, NBN, NF1, NHTL1, PALB2, PDGFRA*, PMS2, POLD1, POLE, PTEN, RAD50, RAD51C, RAD51D, SDHB, SDHC, SDHD, SMAD4, SMARCA4. STK11, TP53, TSC1, TSC2, and VHL.  The following genes were evaluated for sequence changes only: SDHA and HOXB13 c.251G>A  variant only.  RNA analysis is not performed for the * genes.   ?  ?Ductal carcinoma in situ (DCIS) of right breast  ?01/30/2021 Initial Diagnosis  ? Ductal carcinoma in situ (DCIS) of right breast ? ?  ?01/31/2021 Cancer Staging  ? Staging form: Breast, AJCC 8th Edition ?- Clinical stage from 01/31/2021: Stage 0 (cTis (DCIS), cN0, cM0, ER+, PR+) - Signed by Chauncey Cruel, MD on 01/31/2021 ?Stage prefix: Initial diagnosis ?Nuclear grade: G1 ? ?  ?02/16/2021 Definitive Surgery  ? FINAL MICROSCOPIC DIAGNOSIS:  ? ?A. BREAST, RIGHT, LUMPECTOMY:  ?- Intermediate and low grade ductal carcinoma in situ with calcifications.  ?- Atypical lobular hyperplasia.  ?- Biopsy site.  ?- Fibrocystic change with calcifications.  ?- Complex sclerosing lesion.  ?- Intraductal papilloma.  ?- See oncology table.  ?  ?03/29/2021 - 04/25/2021 Radiation Therapy  ? Site Technique Total Dose (Gy) Dose per Fx (Gy) Completed Fx Beam Energies  ?Breast, Right: Breast_Rt 3D 42.56/42.56 2.66 16/16 10X, 6XFFF  ?Breast, Right: Breast_Rt_Bst 3D 8/8 2 4/4 6X, 10X  ? ?  ?06/02/2021 -  Anti-estrogen oral therapy  ? Anastrozole ?  ? ? ?CHIEF COMPLIANT: Ductal carcinoma in situ and elevated hemoglobin ?  ? ?INTERVAL HISTORY: Jen Benedict Farruggia is a 66 y.o. with the above mention Ductal carcinoma in situ. She presents to the clinic today for a follow-up. She complain of hot flashes but  she start taking the anastrozole at night 3 wks ago so hot flashes is moderate now.  She feels that she can tolerate anastrozole even with the moderate hot flashes and this she does not want to switch to any other medication. ?  ? ? ?ALLERGIES:  is allergic to ciprofloxacin hcl, erythromycin, metronidazole, tolmetin, ace inhibitors, lisinopril, eszopiclone, tape, and ciprofloxacin. ? ?MEDICATIONS:  ?Current Outpatient Medications  ?Medication Sig Dispense Refill  ? amLODipine (NORVASC) 2.5 MG tablet Take 2.5 mg by mouth daily.  1  ? anastrozole (ARIMIDEX) 1 MG tablet Take 1 tablet (1 mg  total) by mouth daily. Start OCT 01. 2022 90 tablet 4  ? buPROPion (WELLBUTRIN XL) 300 MG 24 hr tablet Take 300 mg by mouth daily.    ? Calcium Carb-Cholecalciferol (CALCIUM 500+D3 PO) Take 1 tablet by mout

## 2021-12-11 ENCOUNTER — Encounter (HOSPITAL_COMMUNITY): Payer: Self-pay

## 2021-12-13 ENCOUNTER — Other Ambulatory Visit: Payer: Self-pay | Admitting: *Deleted

## 2021-12-13 DIAGNOSIS — D0511 Intraductal carcinoma in situ of right breast: Secondary | ICD-10-CM

## 2021-12-17 ENCOUNTER — Inpatient Hospital Stay: Payer: HMO | Attending: Gynecologic Oncology

## 2021-12-17 ENCOUNTER — Other Ambulatory Visit: Payer: Self-pay

## 2021-12-17 ENCOUNTER — Inpatient Hospital Stay (HOSPITAL_BASED_OUTPATIENT_CLINIC_OR_DEPARTMENT_OTHER): Payer: HMO | Admitting: Hematology and Oncology

## 2021-12-17 VITALS — BP 148/77 | HR 73 | Temp 97.7°F | Resp 18 | Ht 65.5 in | Wt 185.7 lb

## 2021-12-17 DIAGNOSIS — C541 Malignant neoplasm of endometrium: Secondary | ICD-10-CM | POA: Diagnosis not present

## 2021-12-17 DIAGNOSIS — D751 Secondary polycythemia: Secondary | ICD-10-CM | POA: Diagnosis not present

## 2021-12-17 DIAGNOSIS — D0511 Intraductal carcinoma in situ of right breast: Secondary | ICD-10-CM | POA: Insufficient documentation

## 2021-12-17 DIAGNOSIS — R232 Flushing: Secondary | ICD-10-CM | POA: Diagnosis not present

## 2021-12-17 DIAGNOSIS — Z90710 Acquired absence of both cervix and uterus: Secondary | ICD-10-CM | POA: Insufficient documentation

## 2021-12-17 LAB — CMP (CANCER CENTER ONLY)
ALT: 18 U/L (ref 0–44)
AST: 16 U/L (ref 15–41)
Albumin: 4.4 g/dL (ref 3.5–5.0)
Alkaline Phosphatase: 65 U/L (ref 38–126)
Anion gap: 8 (ref 5–15)
BUN: 15 mg/dL (ref 8–23)
CO2: 30 mmol/L (ref 22–32)
Calcium: 10.3 mg/dL (ref 8.9–10.3)
Chloride: 106 mmol/L (ref 98–111)
Creatinine: 0.96 mg/dL (ref 0.44–1.00)
GFR, Estimated: >60 mL/min (ref >60)
Glucose, Bld: 122 mg/dL — ABNORMAL HIGH (ref 70–99)
Potassium: 4.6 mmol/L (ref 3.5–5.1)
Sodium: 144 mmol/L (ref 135–145)
Total Bilirubin: 0.6 mg/dL (ref 0.3–1.2)
Total Protein: 7.2 g/dL (ref 6.5–8.1)

## 2021-12-17 LAB — CBC WITH DIFFERENTIAL (CANCER CENTER ONLY)
Abs Immature Granulocytes: 0.01 10*3/uL (ref 0.00–0.07)
Basophils Absolute: 0.1 10*3/uL (ref 0.0–0.1)
Basophils Relative: 1 %
Eosinophils Absolute: 0.1 10*3/uL (ref 0.0–0.5)
Eosinophils Relative: 2 %
HCT: 46.9 % — ABNORMAL HIGH (ref 36.0–46.0)
Hemoglobin: 17.2 g/dL — ABNORMAL HIGH (ref 12.0–15.0)
Immature Granulocytes: 0 %
Lymphocytes Relative: 19 %
Lymphs Abs: 1.2 10*3/uL (ref 0.7–4.0)
MCH: 32.3 pg (ref 26.0–34.0)
MCHC: 36.7 g/dL — ABNORMAL HIGH (ref 30.0–36.0)
MCV: 88 fL (ref 80.0–100.0)
Monocytes Absolute: 0.6 10*3/uL (ref 0.1–1.0)
Monocytes Relative: 9 %
Neutro Abs: 4.5 10*3/uL (ref 1.7–7.7)
Neutrophils Relative %: 69 %
Platelet Count: 239 10*3/uL (ref 150–400)
RBC: 5.33 MIL/uL — ABNORMAL HIGH (ref 3.87–5.11)
RDW: 12.2 % (ref 11.5–15.5)
WBC Count: 6.4 10*3/uL (ref 4.0–10.5)
nRBC: 0 % (ref 0.0–0.2)

## 2021-12-17 NOTE — Assessment & Plan Note (Addendum)
total abdominal hysterectomy with bilateral salpingo-oophorectomy 11/09/2020 for an endometrioid carcinoma, pT1a, pN0, FIGO grade 2 ?            (a) no involvement of the uterine serosa or cervical stroma or adnexa, ?            (b) a total of 4 left and 5 right pelvic lymph nodes removed ?            (c) loss of MLH1 and PMS2 nuclear expression ?? ?(2) low-grade noninvasive papillary urothelial cancer biopsied 11/09/2020 (followed by Milford Cage) ?? ?(3) genetics testing 12/24/2020 through the [facility] Common Hereditary Cancers +RNA Panel found no deleterious mutations ? ?right breast biopsy 01/23/2021 for ductal carcinoma in situ, low-grade, estrogen and progesterone receptor positive ?? ?(5) status post right lumpectomy 02/16/2021 for ductal carcinoma in situ, grade 1 / 2, with negative margins. ?? ?(6) adjuvant radiation 03/29/2021 through 04/25/2021 ?(7) started anastrozole 06/02/2021 ?            (A) status post hysterectomy March 2022 ?            (B) DEXA scan 11/10/2018 at [facility] showed a T score of -1.7 ?? ?(8 polycythemia: Hb >16.0 in non-smoking woman ?            (A) next generation sequencing was negative for JAK2, MPL and CAL R as well as other associated genes. ?            (B) CT of the abdomen and pelvis 11/30/2020 shows no renal or adrenal mass ?            (C) erythropoietin on 08/11/2012 was 6.4, hemoglobin same day was 16.2 ?-------------------------------------------------------------------------------------------------------------------- ?Anastrozole toxicities: ? ?Breast cancer surveillance: ?1.  Breast exam 12/17/2021: Benign ?2. Mammogram 11/20/2021: Benign breast density category C ? ?Return to clinic in 1 year for follow-up ?

## 2021-12-26 DIAGNOSIS — J3089 Other allergic rhinitis: Secondary | ICD-10-CM | POA: Diagnosis not present

## 2021-12-26 DIAGNOSIS — C50911 Malignant neoplasm of unspecified site of right female breast: Secondary | ICD-10-CM | POA: Diagnosis not present

## 2021-12-26 DIAGNOSIS — I10 Essential (primary) hypertension: Secondary | ICD-10-CM | POA: Diagnosis not present

## 2021-12-26 DIAGNOSIS — R059 Cough, unspecified: Secondary | ICD-10-CM | POA: Diagnosis not present

## 2021-12-26 DIAGNOSIS — Z8551 Personal history of malignant neoplasm of bladder: Secondary | ICD-10-CM | POA: Diagnosis not present

## 2021-12-26 DIAGNOSIS — F331 Major depressive disorder, recurrent, moderate: Secondary | ICD-10-CM | POA: Diagnosis not present

## 2021-12-26 DIAGNOSIS — M8588 Other specified disorders of bone density and structure, other site: Secondary | ICD-10-CM | POA: Diagnosis not present

## 2021-12-26 DIAGNOSIS — Z8542 Personal history of malignant neoplasm of other parts of uterus: Secondary | ICD-10-CM | POA: Diagnosis not present

## 2021-12-26 DIAGNOSIS — Z0000 Encounter for general adult medical examination without abnormal findings: Secondary | ICD-10-CM | POA: Diagnosis not present

## 2021-12-26 DIAGNOSIS — B009 Herpesviral infection, unspecified: Secondary | ICD-10-CM | POA: Diagnosis not present

## 2021-12-26 DIAGNOSIS — Z23 Encounter for immunization: Secondary | ICD-10-CM | POA: Diagnosis not present

## 2022-01-29 ENCOUNTER — Telehealth: Payer: Self-pay | Admitting: *Deleted

## 2022-01-29 NOTE — Telephone Encounter (Signed)
Received call from pt with complaint of right breast tenderness and tightness x several months.  Pt states her right breast is also noticeably smaller than the left. Pt denies recent injury, trauma, redness, warmth, or swelling.  Pt requesting appt for breast exam.  Appt scheduled, pt verbalize understanding of appt date and time.

## 2022-01-31 ENCOUNTER — Other Ambulatory Visit: Payer: Self-pay

## 2022-01-31 ENCOUNTER — Inpatient Hospital Stay: Payer: PPO | Attending: Gynecologic Oncology | Admitting: Adult Health

## 2022-01-31 VITALS — BP 148/80 | HR 69 | Temp 97.7°F | Resp 15 | Wt 188.1 lb

## 2022-01-31 DIAGNOSIS — Z803 Family history of malignant neoplasm of breast: Secondary | ICD-10-CM | POA: Insufficient documentation

## 2022-01-31 DIAGNOSIS — Z90710 Acquired absence of both cervix and uterus: Secondary | ICD-10-CM | POA: Diagnosis not present

## 2022-01-31 DIAGNOSIS — I10 Essential (primary) hypertension: Secondary | ICD-10-CM | POA: Diagnosis not present

## 2022-01-31 DIAGNOSIS — Z8041 Family history of malignant neoplasm of ovary: Secondary | ICD-10-CM | POA: Insufficient documentation

## 2022-01-31 DIAGNOSIS — D751 Secondary polycythemia: Secondary | ICD-10-CM | POA: Diagnosis not present

## 2022-01-31 DIAGNOSIS — C541 Malignant neoplasm of endometrium: Secondary | ICD-10-CM | POA: Insufficient documentation

## 2022-01-31 DIAGNOSIS — D0511 Intraductal carcinoma in situ of right breast: Secondary | ICD-10-CM | POA: Diagnosis not present

## 2022-01-31 DIAGNOSIS — Z8042 Family history of malignant neoplasm of prostate: Secondary | ICD-10-CM | POA: Insufficient documentation

## 2022-01-31 MED ORDER — AMOXICILLIN-POT CLAVULANATE 875-125 MG PO TABS: 1.0000 | ORAL_TABLET | Freq: Two times a day (BID) | ORAL | 0 refills | Status: DC

## 2022-01-31 NOTE — Progress Notes (Signed)
[facility] Follow up:    Harlan Stains, MD [address]  [place] [address]   DIAGNOSIS:  Cancer Staging  Ductal carcinoma in situ (DCIS) of right breast Staging form: Breast, AJCC 8th Edition - Clinical stage from 01/31/2021: Stage 0 (cTis (DCIS), cN0, cM0, ER+, PR+) - Signed by Chauncey Cruel, MD on 01/31/2021 Stage prefix: Initial diagnosis Nuclear grade: G1   SUMMARY OF ONCOLOGIC HISTORY: Oncology History  Endometrial cancer [facility])   Initial Diagnosis   Endometrial cancer ([place])   09/27/2019 Imaging   CT A/P: 1. Large hypoenhancing mass in the region of the lower uterus/upper cervix. This appears to expand the endometrial canal the lower uterine segment although the endometrial canal in the uterine fundus is nondilated. As such, endometrial canal/cervical stricture is not considered likely. Rather, imaging features are highly concerning for neoplasm, likely of endometrial origin. 2. No findings of metastatic disease. Specifically, no evidence for lymphadenopathy in the abdomen/pelvis. No peritoneal or omental nodularity. No ascites. 3. Left colonic diverticulosis without diverticulitis   10/09/2020 Imaging   MRI pelvis: Distended endocervical canal, as described above, favoring a lower cervical stenosis/stricture.   Mild associated polypoid soft tissue/debris in the endometrial cavity and along the lower cervix, poorly evaluated on unenhanced MR. While the endometrial soft tissue favors debris, consider hysteroscopy to exclude a cervical mass resulting in Stenosis/stricture.   10/17/2020 Initial Biopsy   EMB: A. ENDOMETRIUM MASS, RESECTION:  - Endometrioid adenocarcinoma.  - See comment.   B. ENDOMETRIUM, CURETTAGE:  - Blood and scanty benign endocervical mucosa.  - No endometrial tissue identified.   COMMENT:   A. The findings are consistent with FIGO grade 1/2.   11/09/2020 Imaging   Cystoscopy and biopsy with  fulguration of papillary lesions (Dr. Milford Cage), Robotic-assisted laparoscopic total hysterectomy with bilateral salpingoophorectomy, bilateral pelvic LND, mini-lap for specimen removal  Findings: On EUA, cervix difficult to distinguish, flush with apex; uterus small and mobile. Blood and some tumor noted on D&C. On intra-abdominal entry, normal upper abdominal exam including liver edge, diaphragm, stomach, small and large bowel, omentum. Bilateral ovaries atrophic appearing. Uterus 6-8cm and normal appearing. Mapping unsuccessful to bilateral pelvic basins. Significant retroperitoneal fibrosis noted and significant adhesions between the bladder and cervix. Several mildly prominent pelvic lymph nodes, no frank tumor involvement. No intra-abdominal or pelvic disease noted at the end of surgery.   11/09/2020 Pathology Results   A. BLADDER TUMOR, BIOPSY:  - Low grade papillary urothelial carcinoma.  - No distinct lamina propria invasion identified.  - Muscularis propria is present and not involved.   B. CERVIX, RIGHT POSTERIOR, BIOPSY:  - Fibromuscular tissue devoid of epithelium, negative for carcinoma.   C. CERVIX, LEFT POSTERIOR, BIOPSY:  - Fibromuscular tissue with endocervical glandular type epithelium,  negative for carcinoma.   D. LYMPH NODES, RIGHT PELVIC, EXCISION:  - Five lymph nodes, negative for carcinoma (0/5).   E. LYMPH NODES, LEFT PELVIC, EXCISION:  - Four lymph nodes, negative for carcinoma (0/4).   F. UTERUS, CERVIX AND BILATERAL FALLOPIAN TUBES AND OVARIES, TOTAL  HYSTERECTOMY AND BILATERAL SALPINGO-OOPHORECTOMY:  - Endometrioid carcinoma, FIGO grade 2, with invasion less than half the  myometrium.  - No involvement of uterine serosa, cervical stroma or adnexa.  - See oncology table.   ONCOLOGY TABLE:   UTERUS, CARCINOMA OR CARCINOSARCOMA: Resection   Procedure: Total hysterectomy and bilateral salpingo-oophorectomy,  Excision of pelvic lymph nodes  Histologic Type:  Endometrioid carcinoma  Histologic Grade: FIGO grade  2  Myometrial Invasion:       Depth of Myometrial Invasion: 2 mm       Myometrial Thickness: 7 mm       Percentage of Myometrial Invasion: < 50%  Uterine Serosa Involvement: Not identified  Cervical Stroma Involvement: Not identified  Other Tissue/Organ Involvement: Not identified  Peritoneal/Ascitic Fluid: Not submitted/unknown  Lymphovascular Invasion: Not identified  Regional Lymph Nodes:       Pelvic Lymph Nodes Examined:            0 Sentinel            9 Non-sentinel            9 Total       Pelvic Lymph Nodes with Metastasis: 0        Para-aortic Lymph Nodes Examined:            0 Sentinel            0 Non-sentinel            0 Total  Distant Metastasis:       Distant Site(s) Involved: Not applicable  Pathologic Stage Classification (pTNM, AJCC 8th Edition): pT1a, pN0  Ancillary Studies: MMR/MSI testing will be ordered  Representative Tumor Block: F4  (v4.2.0.1)    12/24/2020 Genetic Testing   Negative hereditary cancer genetic testing: no pathogenic variants detected in [facility] Common Hereditary Cancers +RNA Panel.  The report date is December 24, 2020.  The Common Hereditary Cancers + RNA Panel offered by [facility] includes sequencing, deletion/duplication, and RNA testing of the following 47 genes: APC, ATM, AXIN2, BARD1, BMPR1A, BRCA1, BRCA2, BRIP1, CDH1, CDK4*, CDKN2A (p14ARF)*, CDKN2A (p16INK4a)*, CHEK2, CTNNA1, DICER1, EPCAM (Deletion/duplication testing only), GREM1 (promoter region deletion/duplication testing only), KIT, MEN1, MLH1, MSH2, MSH3, MSH6, MUTYH, NBN, NF1, NHTL1, PALB2, PDGFRA*, PMS2, POLD1, POLE, PTEN, RAD50, RAD51C, RAD51D, SDHB, SDHC, SDHD, SMAD4, SMARCA4. STK11, TP53, TSC1, TSC2, and VHL.  The following genes were evaluated for sequence changes only: SDHA and HOXB13 c.251G>A variant only.  RNA analysis is not performed for the * genes.     Ductal carcinoma in situ (DCIS) of right breast  01/30/2021 Initial  Diagnosis   Ductal carcinoma in situ (DCIS) of right breast   01/31/2021 Cancer Staging   Staging form: Breast, AJCC 8th Edition - Clinical stage from 01/31/2021: Stage 0 (cTis (DCIS), cN0, cM0, ER+, PR+) - Signed by Chauncey Cruel, MD on 01/31/2021 Stage prefix: Initial diagnosis Nuclear grade: G1   02/16/2021 Definitive Surgery   FINAL MICROSCOPIC DIAGNOSIS:   A. BREAST, RIGHT, LUMPECTOMY:  - Intermediate and low grade ductal carcinoma in situ with calcifications.  - Atypical lobular hyperplasia.  - Biopsy site.  - Fibrocystic change with calcifications.  - Complex sclerosing lesion.  - Intraductal papilloma.  - See oncology table.    03/29/2021 - 04/25/2021 Radiation Therapy   Site Technique Total Dose (Gy) Dose per Fx (Gy) Completed Fx Beam Energies  Breast, Right: Breast_Rt 3D 42.56/42.56 2.66 16/16 10X, 6XFFF  Breast, Right: Breast_Rt_Bst 3D 8/8 2 4/4 6X, 10X     06/02/2021 -  Anti-estrogen oral therapy   Anastrozole     CURRENT THERAPY: Anastrozole  INTERVAL HISTORY: Danielle Knapp 66 y.o. female returns for follow-up for evaluation of right breast tenderness and tightness that has been ongoing for a couple of months.  She notes some mild swelling and erythema in the upper outer breast.   Patient Active Problem List   Diagnosis Date Noted  Bladder cancer ([place]) 01/31/2021   Ductal carcinoma in situ (DCIS) of right breast 01/30/2021   Genetic testing 12/26/2020   Family history of ovarian cancer 12/08/2020   Family history of breast cancer 12/08/2020   Family history of prostate cancer 12/08/2020   Endometrial cancer ([place])    Hx of adenomatous colonic polyps 02/08/2018   Chest pain 10/15/2017   Hypertensive urgency 10/15/2017   Depression    Hypertension    Polycythemia 08/11/2012    is allergic to ciprofloxacin hcl, erythromycin, metronidazole, tolmetin, ace inhibitors, lisinopril, eszopiclone, tape, and ciprofloxacin.  MEDICAL HISTORY: Past Medical History:   Diagnosis Date   Allergic rhinitis    Allergy    Anxiety    Bladder cancer ([place]) 10/17/2020   Borderline glaucoma, left    no eye drops   BPPV (benign paroxysmal positional vertigo)    Breast cancer ([place]) 01/23/2021   Cancer ([place]) 10/17/2020   Cataract    Chondromalacia of patella    left   Coronary artery disease    cardiac cath 11-03-2017 done for positive ETT,  showed very miminal nonobstrutive cad, ef 55-65%, non caridac chest pain   Diverticulosis of colon    Endometrial cancer ([place])    Family history of breast cancer 12/08/2020   Family history of ovarian cancer 12/08/2020   Family history of prostate cancer 12/08/2020   Frequency of urination    GERD (gastroesophageal reflux disease)    History of chest pain 10/14/2017   ED visit w/ admission,  hypertensive urgency;  follow up with dr Doylene Canard--- had outpatient ETT 10-21-2017 positive ischemia, echo 10-16-2017 G1DD, 60-65%;    s/p cardiac cath 11-03-2017 w/ very miminal nonobstructive cad , chest pain non cardiac   History of COVID-19 06/01/2021   History of diverticulitis    History of kidney stones    HSV-1 infection    fever blisters   Hx of adenomatous colonic polyps 02/08/2018   Hypertension    followed by pcp   MDD (major depressive disorder)    OA (osteoarthritis)    left knee, shoulder   Osteoporosis    Polycythemia    long hx followed by pcp and  sees hematology/ oncology--- dr Jerilynn Mages. Julien Nordmann on as needed basis, chronic and told to donate blood regularly   Stenosis of cervix    Uterine mass    Wears glasses     SURGICAL HISTORY: Past Surgical History:  Procedure Laterality Date   ABDOMINAL HYSTERECTOMY     BREAST LUMPECTOMY WITH RADIOACTIVE SEED LOCALIZATION Right 02/16/2021   Procedure: RIGHT BREAST LUMPECTOMY WITH RADIOACTIVE SEED LOCALIZATION;  Surgeon: Coralie Keens, MD;  Location: [facility];  Service: General;  Laterality: Right;   CATARACT EXTRACTION W/ INTRAOCULAR LENS  IMPLANT,  BILATERAL  2013   COLONOSCOPY  02-02-2018 dr Carlean Purl   CYSTOSCOPY  10/17/2020   Procedure: CYSTOSCOPY;  Surgeon: Nunzio Cobbs, MD;  Location: [facility];  Service: Gynecology;;   CYSTOSCOPY N/A 11/09/2020   Procedure: Consuela Mimes;  Surgeon: Remi Haggard, MD;  Location: [facility];  Service: Urology;  Laterality: N/A;  Dr. Milford Cage needs to go first. [name] and rigid bx forceps.   DIAGNOSTIC LAPAROSCOPY  1996   DILATATION & CURETTAGE/HYSTEROSCOPY WITH MYOSURE N/A 10/17/2020   Procedure: DILATATION & CURETTAGE/HYSTEROSCOPY WITH  MYOSURE RESECTION OF ENDOMETRIAL MASS,  OPENING OF CERVICAL STENOSIS, DRAINAGE OF UTERINE FLUID;  Surgeon: Nunzio Cobbs, MD;  Location: [facility];  Service: Gynecology;  Laterality: N/A;   EYE SURGERY     FRACTURE SURGERY     HEEL SPUR SURGERY Right 2015   Dr. Durward Fortes   HEMORRHOID SURGERY  2004   LEFT HEART CATH AND CORONARY ANGIOGRAPHY N/A 11/03/2017   Procedure: LEFT HEART CATH AND CORONARY ANGIOGRAPHY;  Surgeon: Jettie Booze, MD;  Location: [facility];  Service: Cardiovascular;  Laterality: N/A;   LYMPH NODE DISSECTION N/A 11/09/2020   Procedure: BILATERAL LYMPH NODE DISSECTION;MINI LAPAROTOMY;  Surgeon: Lafonda Mosses, MD;  Location: [facility];  Service: Gynecology;  Laterality: N/A;   OPERATIVE ULTRASOUND N/A 10/17/2020   Procedure: OPERATIVE ULTRASOUND;  Surgeon: Nunzio Cobbs, MD;  Location[facility];  Service: Gynecology;  Laterality: N/A;   ROBOTIC ASSISTED TOTAL HYSTERECTOMY WITH BILATERAL SALPINGO OOPHERECTOMY Bilateral 11/09/2020   Procedure: XI ROBOTIC ASSISTED TOTAL HYSTERECTOMY WITH BILATERAL SALPINGO OOPHORECTOMY,;  Surgeon: Lafonda Mosses, MD;  Location: [facility];  Service: Gynecology;  Laterality: Bilateral;   SEPTOPLASTY  x2 1970's   TONSILLECTOMY  age 73   TRANSURETHRAL RESECTION OF BLADDER TUMOR N/A 11/09/2020   Procedure: TRANSURETHRAL RESECTION OF  BLADDER TUMOR (TURBT);  Surgeon: Remi Haggard, MD;  Location: [facility];  Service: Urology;  Laterality: N/A;    SOCIAL HISTORY: Social History   Socioeconomic History   Marital status: Married    Spouse name: Not on file   Number of children: Not on file   Years of education: Not on file   Highest education level: Not on file  Occupational History   Not on file  Tobacco Use   Smoking status: Never   Smokeless tobacco: Never  Vaping Use   Vaping Use: Never used  Substance and Sexual Activity   Alcohol use: Yes    Alcohol/week: 2.0 standard drinks of alcohol    Types: 2 Glasses of wine per week    Comment: occasional wine   Drug use: Never   Sexual activity: Not Currently    Partners: Male    Birth control/protection: Post-menopausal, None  Other Topics Concern   Not on file  Social History Narrative   Not on file   Social Determinants of Health   Financial Resource Strain: Not on file  Food Insecurity: Not on file  Transportation Needs: Not on file  Physical Activity: Not on file  Stress: Not on file  Social Connections: Not on file  Intimate Partner Violence: Not on file    FAMILY HISTORY: Family History  Problem Relation Age of Onset   Osteoporosis Mother    Heart attack Mother    Alzheimer's disease Mother    Arthritis Mother    Depression Mother    Diabetes Father    Hypertension Father    Heart failure Father    Alcohol abuse Father    COPD Father    Ovarian cancer Sister 20   Stroke Maternal Grandmother    Prostate cancer Maternal Grandfather 74       metastatic   Cancer Maternal Grandfather    Breast cancer Other        MGM's sister; dx after 61   Vision loss Paternal Grandmother    Colon cancer Neg Hx    Colon polyps Neg Hx    Esophageal cancer Neg Hx    Rectal cancer Neg Hx    Stomach cancer Neg Hx    Endometrial cancer Neg Hx    Pancreatic cancer Neg Hx     Review of Systems  Constitutional:  Negative for appetite change, chills,  fatigue, fever and unexpected weight change.  HENT:   Negative for hearing loss, lump/mass and trouble swallowing.   Eyes:  Negative for eye problems and icterus.  Respiratory:  Negative for chest tightness, cough and shortness of breath.   Cardiovascular:  Negative for chest pain, leg swelling and palpitations.  Gastrointestinal:  Negative for abdominal distention, abdominal pain, constipation, diarrhea, nausea and vomiting.  Endocrine: Negative for hot flashes.  Genitourinary:  Negative for difficulty urinating.   Musculoskeletal:  Negative for arthralgias.  Skin:  Negative for itching and rash.  Neurological:  Negative for dizziness, extremity weakness, headaches and numbness.  Hematological:  Negative for adenopathy. Does not bruise/bleed easily.  Psychiatric/Behavioral:  Negative for depression. The patient is not nervous/anxious.       PHYSICAL EXAMINATION  ECOG PERFORMANCE STATUS: 1 - Symptomatic but completely ambulatory  Vitals:   01/31/22 1433  BP: (!) 148/80  Pulse: 69  Resp: 15  Temp: 97.7 F (36.5 C)  SpO2: 100%    Physical Exam Constitutional:      General: She is not in acute distress.    Appearance: Normal appearance. She is not toxic-appearing.  HENT:     Head: Normocephalic and atraumatic.  Eyes:     General: No scleral icterus. Cardiovascular:     Rate and Rhythm: Normal rate and regular rhythm.     Pulses: Normal pulses.     Heart sounds: Normal heart sounds.  Pulmonary:     Effort: Pulmonary effort is normal.     Breath sounds: Normal breath sounds.  Chest:     Comments: Right breast with questionable area of fluctuance in right upper outer quadrant.  Tenderness over this area in addition to slight erythema. Abdominal:     General: Abdomen is flat. Bowel sounds are normal. There is no distension.     Palpations: Abdomen is soft.     Tenderness: There is no abdominal tenderness.  Musculoskeletal:        General: No swelling.     Cervical back:  Neck supple.  Lymphadenopathy:     Cervical: No cervical adenopathy.  Skin:    General: Skin is warm and dry.     Findings: No rash.  Neurological:     General: No focal deficit present.     Mental Status: She is alert.  Psychiatric:        Mood and Affect: Mood normal.        Behavior: Behavior normal.     LABORATORY DATA:  CBC    Component Value Date/Time   WBC 6.4 12/17/2021 0811   WBC 8.4 11/06/2020 1409   RBC 5.33 (H) 12/17/2021 0811   HGB 17.2 (H) 12/17/2021 0811   HGB 15.6 10/31/2017 0829   HGB 16.4 (H) 09/01/2012 1504   HCT 46.9 (H) 12/17/2021 0811   HCT 41.9 10/31/2017 0829   HCT 45.7 09/01/2012 1504   PLT 239 12/17/2021 0811   PLT 263 10/31/2017 0829   MCV 88.0 12/17/2021 0811   MCV 90 10/31/2017 0829   MCV 92.8 09/01/2012 1504   MCH 32.3 12/17/2021 0811   MCHC 36.7 (H) 12/17/2021 0811   RDW 12.2 12/17/2021 0811   RDW 12.6 10/31/2017 0829   RDW 13.0 09/01/2012 1504   LYMPHSABS 1.2 12/17/2021 0811   LYMPHSABS 1.8 10/31/2017 0829   LYMPHSABS 2.7 09/01/2012 1504   MONOABS 0.6 12/17/2021 0811   MONOABS 0.7 09/01/2012 1504   EOSABS 0.1 12/17/2021 5009  EOSABS 0.3 10/31/2017 0829   BASOSABS 0.1 12/17/2021 0811   BASOSABS 0.1 10/31/2017 0829   BASOSABS 0.1 09/01/2012 1504    CMP     Component Value Date/Time   NA 144 12/17/2021 0811   NA 147 (H) 10/31/2017 0829   NA 141 08/11/2012 0935   K 4.6 12/17/2021 0811   K 3.6 08/11/2012 0935   CL 106 12/17/2021 0811   CL 105 08/11/2012 0935   CO2 30 12/17/2021 0811   CO2 26 08/11/2012 0935   GLUCOSE 122 (H) 12/17/2021 0811   GLUCOSE 115 (H) 08/11/2012 0935   BUN 15 12/17/2021 0811   BUN 18 10/31/2017 0829   BUN 18.0 08/11/2012 0935   CREATININE 0.96 12/17/2021 0811   CREATININE 0.8 08/11/2012 0935   CALCIUM 10.3 12/17/2021 0811   CALCIUM 9.9 08/11/2012 0935   PROT 7.2 12/17/2021 0811   PROT 7.4 08/11/2012 0935   ALBUMIN 4.4 12/17/2021 0811   ALBUMIN 4.1 08/11/2012 0935   AST 16 12/17/2021 0811    AST 17 08/11/2012 0935   ALT 18 12/17/2021 0811   ALT 24 08/11/2012 0935   ALKPHOS 65 12/17/2021 0811   ALKPHOS 53 08/11/2012 0935   BILITOT 0.6 12/17/2021 0811   BILITOT 0.79 08/11/2012 0935   GFRNONAA >60 12/17/2021 0811   GFRAA >60 10/25/2019 1237    ASSESSMENT and THERAPY PLAN:   Ductal carcinoma in situ (DCIS) of right breast total abdominal hysterectomy with bilateral salpingo-oophorectomy 11/09/2020 for an endometrioid carcinoma, pT1a, pN0, FIGO grade 2             (a) no involvement of the uterine serosa or cervical stroma or adnexa,             (b) a total of 4 left and 5 right pelvic lymph nodes removed             (c) loss of MLH1 and PMS2 nuclear expression   (2) low-grade noninvasive papillary urothelial cancer biopsied 11/09/2020 (followed by Milford Cage)   (3) genetics testing 12/24/2020 through the [facility] Common Hereditary Cancers +RNA Panel found no deleterious mutations  right breast biopsy 01/23/2021 for ductal carcinoma in situ, low-grade, estrogen and progesterone receptor positive   (5) status post right lumpectomy 02/16/2021 for ductal carcinoma in situ, grade 1 / 2, with negative margins.   (6) adjuvant radiation 03/29/2021 through 04/25/2021 (7) started anastrozole 06/02/2021             (A) status post hysterectomy March 2022             (B) D[facility] scan 11/10/2018 at [facility] showed a T score of -1.7   (8 polycythemia: Hb >16.0 in non-smoking woman             (A) next generation sequencing was negative for JAK2, MPL and CAL R as well as other associated genes.             (B) CT of the abdomen and pelvis 11/30/2020 shows no renal or adrenal mass             (C) erythropoietin on 08/11/2012 was 6.4, hemoglobin same day was 16.2 -------------------------------------------------------------------------------------------------------------------- Anastrozole toxicities: None  Danielle Knapp is here today for follow-up of her right breast fullness.  I am concerned she has  a seroma in the breast that could be questionably infected.  I have sent in Augmentin for her to take and ordered a mammogram and ultrasound of that breast to further evaluate.   All questions were  answered. The patient knows to call the clinic with any problems, questions or concerns. We can certainly see the patient much sooner if necessary.  Total encounter time:20 minutes*in face-to-face visit time, chart review, lab review, care coordination, order entry, and documentation of the encounter time.  Wilber Bihari, NP 02/07/22 3:42 PM Medical Oncology and Hematology [facility] [place], [place] [address] Tel. [phone]    Fax. [phone]  *Total Encounter Time as defined by the Centers for Medicare and Medicaid Services includes, in addition to the face-to-face time of a patient visit (documented in the note above) non-face-to-face time: obtaining and reviewing outside history, ordering and reviewing medications, tests or procedures, care coordination (communications with other health care professionals or caregivers) and documentation in the medical record.

## 2022-02-04 DIAGNOSIS — N644 Mastodynia: Secondary | ICD-10-CM | POA: Diagnosis not present

## 2022-02-04 DIAGNOSIS — Z853 Personal history of malignant neoplasm of breast: Secondary | ICD-10-CM | POA: Diagnosis not present

## 2022-02-07 ENCOUNTER — Encounter: Payer: Self-pay | Admitting: Adult Health

## 2022-02-07 NOTE — Assessment & Plan Note (Signed)
total abdominal hysterectomy with bilateral salpingo-oophorectomy 11/09/2020 for an endometrioid carcinoma, pT1a, pN0, FIGO grade 2             (a) no involvement of the uterine serosa or cervical stroma or adnexa,             (b) a total of 4 left and 5 right pelvic lymph nodes removed             (c) loss of MLH1 and PMS2 nuclear expression  (2) low-grade noninvasive papillary urothelial cancer biopsied 11/09/2020 (followed by Milford Cage)  (3) genetics testing 12/24/2020 through the [facility] Common Hereditary Cancers +RNA Panel found no deleterious mutations  right breast biopsy 01/23/2021 for ductal carcinoma in situ, low-grade, estrogen and progesterone receptor positive  (5) status post right lumpectomy 02/16/2021 for ductal carcinoma in situ, grade 1 / 2, with negative margins.  (6) adjuvant radiation 03/29/2021 through 04/25/2021 (7) started anastrozole 06/02/2021             (A) status post hysterectomy March 2022             (B) DEXA scan 11/10/2018 at [facility] showed a T score of -1.7  (8 polycythemia: Hb >16.0 in non-smoking woman             (A) next generation sequencing was negative for JAK2, MPL and CAL R as well as other associated genes.             (B) CT of the abdomen and pelvis 11/30/2020 shows no renal or adrenal mass             (C) erythropoietin on 08/11/2012 was 6.4, hemoglobin same day was 16.2 -------------------------------------------------------------------------------------------------------------------- Anastrozole toxicities: None  Danielle Knapp is here today for follow-up of her right breast fullness.  I am concerned she has a seroma in the breast that could be questionably infected.  I have sent in Augmentin for her to take and ordered a mammogram and ultrasound of that breast to further evaluate.

## 2022-02-25 DIAGNOSIS — F331 Major depressive disorder, recurrent, moderate: Secondary | ICD-10-CM | POA: Diagnosis not present

## 2022-02-25 DIAGNOSIS — I10 Essential (primary) hypertension: Secondary | ICD-10-CM | POA: Diagnosis not present

## 2022-03-01 DIAGNOSIS — C672 Malignant neoplasm of lateral wall of bladder: Secondary | ICD-10-CM | POA: Diagnosis not present

## 2022-03-14 DIAGNOSIS — H8111 Benign paroxysmal vertigo, right ear: Secondary | ICD-10-CM | POA: Diagnosis not present

## 2022-03-19 DIAGNOSIS — R42 Dizziness and giddiness: Secondary | ICD-10-CM | POA: Diagnosis not present

## 2022-03-21 NOTE — Therapy (Signed)
OUTPATIENT PHYSICAL THERAPY VESTIBULAR EVALUATION     Patient Name: Danielle Knapp MRN: [id number] DOB:[birth year], 66 y.o., female Today's Date: 03/22/2022  PCP: Harlan Stains, MD  REFERRING PROVIDER: Willaim Rayas, [place]   PT End of Session - 03/22/22 1113     Visit Number 1    Number of Visits 9    Date for PT Re-Evaluation 04/19/22    Authorization Type [employer]    PT Start Time 1021    PT Stop Time 1106    PT Time Calculation (min) 45 min    Activity Tolerance Patient tolerated treatment well    Behavior During Therapy [facility] for tasks assessed/performed             Past Medical History:  Diagnosis Date   Allergic rhinitis    Allergy    Anxiety    Bladder cancer ([place]) 10/17/2020   Borderline glaucoma, left    no eye drops   BPPV (benign paroxysmal positional vertigo)    Breast cancer ([place]) 01/23/2021   Cancer ([place]) 10/17/2020   Cataract    Chondromalacia of patella    left   Coronary artery disease    cardiac cath 11-03-2017 done for positive ETT,  showed very miminal nonobstrutive cad, ef 55-65%, non caridac chest pain   Diverticulosis of colon    Endometrial cancer ([place])    Family history of breast cancer 12/08/2020   Family history of ovarian cancer 12/08/2020   Family history of prostate cancer 12/08/2020   Frequency of urination    GERD (gastroesophageal reflux disease)    History of chest pain 10/14/2017   ED visit w/ admission,  hypertensive urgency;  follow up with dr Doylene Canard--- had outpatient ETT 10-21-2017 positive ischemia, echo 10-16-2017 G1DD, 60-65%;    s/p cardiac cath 11-03-2017 w/ very miminal nonobstructive cad , chest pain non cardiac   History of COVID-19 06/01/2021   History of diverticulitis    History of kidney stones    HSV-1 infection    fever blisters   Hx of adenomatous colonic polyps 02/08/2018   Hypertension    followed by pcp   MDD (major depressive disorder)    OA (osteoarthritis)    left knee, shoulder    Osteoporosis    Polycythemia    long hx followed by pcp and  sees hematology/ oncology--- dr Jerilynn Mages. Julien Nordmann on as needed basis, chronic and told to donate blood regularly   Stenosis of cervix    Uterine mass    Wears glasses    Past Surgical History:  Procedure Laterality Date   ABDOMINAL HYSTERECTOMY     BREAST LUMPECTOMY WITH RADIOACTIVE SEED LOCALIZATION Right 02/16/2021   Procedure: RIGHT BREAST LUMPECTOMY WITH RADIOACTIVE SEED LOCALIZATION;  Surgeon: Coralie Keens, MD;  Location: [place];  Service: General;  Laterality: Right;   CATARACT EXTRACTION W/ INTRAOCULAR LENS  IMPLANT, BILATERAL  2013   COLONOSCOPY  02-02-2018 dr Carlean Purl   CYSTOSCOPY  10/17/2020   Procedure: CYSTOSCOPY;  Surgeon: Nunzio Cobbs, MD;  Location: [facility];  Service: Gynecology;;   CYSTOSCOPY N/A 11/09/2020   Procedure: Consuela Mimes;  Surgeon: Remi Haggard, MD;  Location: [facility];  Service: Urology;  Laterality: N/A;  Dr. Milford Cage needs to go first. [name] and rigid bx forceps.   DIAGNOSTIC LAPAROSCOPY  1996   DILATATION & CURETTAGE/HYSTEROSCOPY WITH MYOSURE N/A 10/17/2020   Procedure: DILATATION & CURETTAGE/HYSTEROSCOPY WITH  MYOSURE RESECTION OF ENDOMETRIAL MASS,  OPENING OF CERVICAL STENOSIS, DRAINAGE  OF UTERINE FLUID;  Surgeon: Nunzio Cobbs, MD;  Location: [facility];  Service: Gynecology;  Laterality: N/A;   EYE SURGERY     FRACTURE SURGERY     HEEL SPUR SURGERY Right 2015   Dr. Durward Fortes   HEMORRHOID SURGERY  2004   LEFT HEART CATH AND CORONARY ANGIOGRAPHY N/A 11/03/2017   Procedure: LEFT HEART CATH AND CORONARY ANGIOGRAPHY;  Surgeon: Jettie Booze, MD;  Location: [place] [facility];  Service: Cardiovascular;  Laterality: N/A;   LYMPH NODE DISSECTION N/A 11/09/2020   Procedure: BILATERAL LYMPH NODE DISSECTION;MINI LAPAROTOMY;  Surgeon: Lafonda Mosses, MD;  Location: [facility];  Service: Gynecology;  Laterality: N/A;   OPERATIVE  ULTRASOUND N/A 10/17/2020   Procedure: OPERATIVE ULTRASOUND;  Surgeon: Nunzio Cobbs, MD;  Location: [facility];  Service: Gynecology;  Laterality: N/A;   ROBOTIC ASSISTED TOTAL HYSTERECTOMY WITH BILATERAL SALPINGO OOPHERECTOMY Bilateral 11/09/2020   Procedure: XI ROBOTIC ASSISTED TOTAL HYSTERECTOMY WITH BILATERAL SALPINGO OOPHORECTOMY,;  Surgeon: Lafonda Mosses, MD;  [facility];  Service: Gynecology;  Laterality: Bilateral;   SEPTOPLASTY  x2 1970's   TONSILLECTOMY  age 79   TRANSURETHRAL RESECTION OF BLADDER TUMOR N/A 11/09/2020   Procedure: TRANSURETHRAL RESECTION OF BLADDER TUMOR (TURBT);  Surgeon: Remi Haggard, MD;  [facility];  Service: Urology;  Laterality: N/A;   Patient Active Problem List   Diagnosis Date Noted   Bladder cancer ([place]) 01/31/2021   Ductal carcinoma in situ (DCIS) of right breast 01/30/2021   Genetic testing 12/26/2020   Family history of ovarian cancer 12/08/2020   Family history of breast cancer 12/08/2020   Family history of prostate cancer 12/08/2020   Endometrial cancer ([place])    Hx of adenomatous colonic polyps 02/08/2018   Chest pain 10/15/2017   Hypertensive urgency 10/15/2017   Depression    Hypertension    Polycythemia 08/11/2012    ONSET DATE: 3 months ago  REFERRING DIAG: R42 (ICD-10-CM) - Dizziness and giddiness  THERAPY DIAG:  BPPV (benign paroxysmal positional vertigo), bilateral  Dizziness and giddiness  Unsteadiness on feet  Rationale for Evaluation and Treatment Rehabilitation  SUBJECTIVE:   SUBJECTIVE STATEMENT: Reports dizziness worsening for the past 3 months or so. Has a hx of BPPV 3-4 years ago. This episode, was treated with R epley at ENT and this has made the sx of spinning better but still feeling off balance. Reports that she had a fall going down the stairs, hitting her back and head. Denies HA, worsening dizziness, blurred vision, N/T since the fall. Denies infection/illness,  vision changes/double vision, hearing loss, otalgia, phonophobia, migraines. Notes occasionally tinnitus, chronic sensitivity to light.   Pt accompanied by: self  PERTINENT HISTORY: anxiety, bladder CA, breast CA with R lumpectomy, CAD, endometrial CA, HTN, osteoporosis   PAIN:  Are you having pain? No  PRECAUTIONS: Fall  WEIGHT BEARING RESTRICTIONS No  FALLS: Has patient fallen in last 6 months? Yes. Number of falls 1  LIVING ENVIRONMENT: Lives with: lives with their spouse Lives in: House/apartment Stairs:  2 story home with ~20 steps with 1 handrail Has following equipment at home: None  PLOF: Independent; working- requires sitting, computer work, standing, lifting  PATIENT GOALS improve balance  OBJECTIVE:   DIAGNOSTIC FINDINGS: none recent  COGNITION: Overall cognitive status: Within functional limits for tasks assessed   SENSATION: WFL  GAIT: Gait pattern:  slight antalgia and unsteadiness Assistive device utilized: None  PATIENT SURVEYS:  FOTO 56.2130   VESTIBULAR ASSESSMENT   GENERAL OBSERVATION: wearing progressive lenses which she wears most of the time     OCULOMOTOR EXAM:   Ocular Alignment: L eye slightly elevated   Ocular ROM: No Limitations   Spontaneous Nystagmus: absent   Gaze-Induced Nystagmus: absent   Smooth Pursuits: intact   Saccades: intact   Convergence/Divergence: ~5 inches    VESTIBULAR - OCULAR REFLEX:    Slow VOR: Normal   VOR Cancellation: Normal   Head-Impulse Test: HIT Right: slightly positive HIT Left: positive *c/o dizziness     POSITIONAL TESTING:  *c/o dizziness and indiscernable nystagmus evident in supine Right Roll Test: possible R upbeating torsional nystagmus and dizziness; Duration: 25 sec Left Roll Test: possible L upbeating torsional nystagmus and dizziness; Duration: 40 sec Right Dix-Hallpike: NT Left Dix-Hallpike: L upbeating torsional nystagmus; Duration: 20 sec  VESTIBULAR TREATMENT:  Canalith  Repositioning:   Epley Left: Number of Reps: 2, Response to Treatment: symptoms improved, and Comment: upon R head turn patient with ~5 sec of R upbeating torsional nystagmus followed by R downbeating torsional nystagmus lasting ~20 sec  PATIENT EDUCATION: Education details: prognosis, POC, edu on exam results, anatomy and physiology of BPPV and vestibular hypofx Person educated: Patient Education method: Explanation Education comprehension: verbalized understanding and returned demonstration   GOALS: Goals reviewed with patient? Yes  SHORT TERM GOALS: Target date: 04/12/2022  Patient to be independent with initial HEP. Baseline: HEP initiated Goal status: INITIAL    LONG TERM GOALS: Target date: 04/19/2022  Patient to be independent with advanced HEP. Baseline: Not yet initiated  Goal status: INITIAL  Patient to report 0/10 dizziness with standing vertical and horizontal VOR at quick pae for 30 seconds. Baseline: see above Goal status: INITIAL  Patient will report 0/10 dizziness with bed mobility.  Baseline: Symptomatic  Goal status: INITIAL  Patient to demonstrate mild-moderate sway with M-CTSIB condition with eyes closed/foam surface in order to improve safety in environments with uneven surfaces and dim lighting. Baseline: NT Goal status: INITIAL  Patient to score at least 20/24 on DGI in order to decrease risk of falls. Baseline: NT Goal status: INITIAL  Patient will ambulate over outdoor surfaces with LRAD while performing head turns to scan environment with good stability in order to indicate safe community mobility. Baseline: NT Goal status: INITIAL    ASSESSMENT:  CLINICAL IMPRESSION:   Patient is a 66 y/o F presenting to OPPT with c/o dizziness for the past 3 months. Was treated with Epley at ENT and reports improvement in sensation of "spinning" with this treatment, but notes remaining imbalance. Also notes a recent fall that resulted in head trauma but  denies HA, worsening dizziness, blurred vision, N/T since the fall. Denies infection/illness, vision changes/double vision, hearing loss, otalgia, phonophobia, migraines. Notes occasionally tinnitus, chronic sensitivity to light. Oculomotor exam revealed convergence insufficiency, positive B HIT. Positional testing was positive L DH, treated with L Epley x2 with good response. Pattern of nystagmus suggests possible R BPPV, to be addressed next session. Would benefit from skilled PT services 1-2x/week for 4 weeks to address aforementioned impairments in order to optimize level of function.     OBJECTIVE IMPAIRMENTS Abnormal gait, decreased activity tolerance, decreased balance, and dizziness.   ACTIVITY LIMITATIONS carrying, lifting, bending, standing, squatting, sleeping, stairs, transfers, bed mobility, bathing, toileting, dressing, and reach over head  PARTICIPATION LIMITATIONS: meal prep, cleaning, laundry, driving, shopping, community activity, occupation, yard work, and church  PERSONAL FACTORS Age, Past/current experiences, Time  since onset of injury/illness/exacerbation, and 3+ comorbidities: anxiety, bladder CA, breast CA with R lumpectomy, CAD, endometrial CA, HTN, osteoporosis  are also affecting patient's functional outcome.   REHAB POTENTIAL: Good  CLINICAL DECISION MAKING: Evolving/moderate complexity  EVALUATION COMPLEXITY: Moderate   PLAN: PT FREQUENCY: 1-2x/week  PT DURATION: 4 weeks  PLANNED INTERVENTIONS: Therapeutic exercises, Therapeutic activity, Neuromuscular re-education, Balance training, Gait training, Patient/Family education, Self Care, Joint mobilization, Stair training, Vestibular training, Canalith repositioning, Dry Needling, Cryotherapy, Moist heat, Taping, Manual therapy, and Re-evaluation  PLAN FOR NEXT SESSION: retest L and R DH, MCTSIB, DGI; initiate VOR and habituation HEP   Janene Harvey, PT, DPT 03/22/22 11:22 AM  [facility] at [facility] [address], [place], [place] [address] Phone # [phone] Fax # [phone]

## 2022-03-22 ENCOUNTER — Encounter: Payer: Self-pay | Admitting: Physical Therapy

## 2022-03-22 ENCOUNTER — Ambulatory Visit: Payer: PPO | Attending: Student | Admitting: Physical Therapy

## 2022-03-22 ENCOUNTER — Other Ambulatory Visit: Payer: Self-pay

## 2022-03-22 DIAGNOSIS — R42 Dizziness and giddiness: Secondary | ICD-10-CM | POA: Insufficient documentation

## 2022-03-22 DIAGNOSIS — R2681 Unsteadiness on feet: Secondary | ICD-10-CM | POA: Diagnosis not present

## 2022-03-22 DIAGNOSIS — H8113 Benign paroxysmal vertigo, bilateral: Secondary | ICD-10-CM | POA: Insufficient documentation

## 2022-03-25 ENCOUNTER — Ambulatory Visit: Payer: PPO

## 2022-03-25 DIAGNOSIS — H8113 Benign paroxysmal vertigo, bilateral: Secondary | ICD-10-CM

## 2022-03-25 DIAGNOSIS — R42 Dizziness and giddiness: Secondary | ICD-10-CM

## 2022-03-25 DIAGNOSIS — R2681 Unsteadiness on feet: Secondary | ICD-10-CM

## 2022-03-25 NOTE — Therapy (Signed)
OUTPATIENT PHYSICAL THERAPY TREATMENT NOTE   Patient Name: Danielle Knapp MRN: [id number] DOB:08-09-1956, 66 y.o., female Today's Date: 03/25/2022  PCP: Harlan Stains, MD  REFERRING PROVIDER: Willaim Rayas, [place]   END OF SESSION:   PT End of Session - 03/25/22 0930     Visit Number 2    Number of Visits 9    Date for PT Re-Evaluation 04/19/22    Authorization Type [employer]    PT Start Time 0930    PT Stop Time 1015    PT Time Calculation (min) 45 min    Activity Tolerance Patient tolerated treatment well    Behavior During Therapy [facility] for tasks assessed/performed             Past Medical History:  Diagnosis Date   Allergic rhinitis    Allergy    Anxiety    Bladder cancer ([place]) 10/17/2020   Borderline glaucoma, left    no eye drops   BPPV (benign paroxysmal positional vertigo)    Breast cancer ([place]) 01/23/2021   Cancer ([place]) 10/17/2020   Cataract    Chondromalacia of patella    left   Coronary artery disease    cardiac cath 11-03-2017 done for positive ETT,  showed very miminal nonobstrutive cad, ef 55-65%, non caridac chest pain   Diverticulosis of colon    Endometrial cancer ([place])    Family history of breast cancer 12/08/2020   Family history of ovarian cancer 12/08/2020   Family history of prostate cancer 12/08/2020   Frequency of urination    GERD (gastroesophageal reflux disease)    History of chest pain 10/14/2017   ED visit w/ admission,  hypertensive urgency;  follow up with dr Doylene Canard--- had outpatient ETT 10-21-2017 positive ischemia, echo 10-16-2017 G1DD, 60-65%;    s/p cardiac cath 11-03-2017 w/ very miminal nonobstructive cad , chest pain non cardiac   History of COVID-19 06/01/2021   History of diverticulitis    History of kidney stones    HSV-1 infection    fever blisters   Hx of adenomatous colonic polyps 02/08/2018   Hypertension    followed by pcp   MDD (major depressive disorder)    OA (osteoarthritis)    left knee, shoulder    Osteoporosis    Polycythemia    long hx followed by pcp and  sees hematology/ oncology--- dr Jerilynn Mages. Julien Nordmann on as needed basis, chronic and told to donate blood regularly   Stenosis of cervix    Uterine mass    Wears glasses    Past Surgical History:  Procedure Laterality Date   ABDOMINAL HYSTERECTOMY     BREAST LUMPECTOMY WITH RADIOACTIVE SEED LOCALIZATION Right 02/16/2021   Procedure: RIGHT BREAST LUMPECTOMY WITH RADIOACTIVE SEED LOCALIZATION;  Surgeon: Coralie Keens, MD;  Location: [place];  Service: General;  Laterality: Right;   CATARACT EXTRACTION W/ INTRAOCULAR LENS  IMPLANT, BILATERAL  2013   COLONOSCOPY  02-02-2018 dr Carlean Purl   CYSTOSCOPY  10/17/2020   Procedure: CYSTOSCOPY;  Surgeon: Nunzio Cobbs, MD;  Location: [facility];  Service: Gynecology;;   CYSTOSCOPY N/A 11/09/2020   Procedure: Consuela Mimes;  Surgeon: Remi Haggard, MD;  [facility];  Service: Urology;  Laterality: N/A;  Dr. Milford Cage needs to go first. [name] and rigid bx forceps.   DIAGNOSTIC LAPAROSCOPY  1996   DILATATION & CURETTAGE/HYSTEROSCOPY WITH MYOSURE N/A 10/17/2020   Procedure: DILATATION & CURETTAGE/HYSTEROSCOPY WITH  MYOSURE RESECTION OF ENDOMETRIAL MASS,  OPENING OF  CERVICAL STENOSIS, DRAINAGE OF UTERINE FLUID;  Surgeon: Nunzio Cobbs, MD;  Location: [facility];  Service: Gynecology;  Laterality: N/A;   EYE SURGERY     FRACTURE SURGERY     HEEL SPUR SURGERY Right 2015   Dr. Durward Fortes   HEMORRHOID SURGERY  2004   LEFT HEART CATH AND CORONARY ANGIOGRAPHY N/A 11/03/2017   Procedure: LEFT HEART CATH AND CORONARY ANGIOGRAPHY;  Surgeon: Jettie Booze, MD;  Location: [place] [facility];  Service: Cardiovascular;  Laterality: N/A;   LYMPH NODE DISSECTION N/A 11/09/2020   Procedure: BILATERAL LYMPH NODE DISSECTION;MINI LAPAROTOMY;  Surgeon: Lafonda Mosses, MD;  [facility];  Service: Gynecology;  Laterality: N/A;    OPERATIVE ULTRASOUND N/A 10/17/2020   Procedure: OPERATIVE ULTRASOUND;  Surgeon: Nunzio Cobbs, MD;  Location: [facility];  Service: Gynecology;  Laterality: N/A;   ROBOTIC ASSISTED TOTAL HYSTERECTOMY WITH BILATERAL SALPINGO OOPHERECTOMY Bilateral 11/09/2020   Procedure: XI ROBOTIC ASSISTED TOTAL HYSTERECTOMY WITH BILATERAL SALPINGO OOPHORECTOMY,;  Surgeon: Lafonda Mosses, MD;  [facility];  Service: Gynecology;  Laterality: Bilateral;   SEPTOPLASTY  x2 1970's   TONSILLECTOMY  age 38   TRANSURETHRAL RESECTION OF BLADDER TUMOR N/A 11/09/2020   Procedure: TRANSURETHRAL RESECTION OF BLADDER TUMOR (TURBT);  Surgeon: Remi Haggard, MD;  [facility];  Service: Urology;  Laterality: N/A;   Patient Active Problem List   Diagnosis Date Noted   Bladder cancer ([place]) 01/31/2021   Ductal carcinoma in situ (DCIS) of right breast 01/30/2021   Genetic testing 12/26/2020   Family history of ovarian cancer 12/08/2020   Family history of breast cancer 12/08/2020   Family history of prostate cancer 12/08/2020   Endometrial cancer ([place])    Hx of adenomatous colonic polyps 02/08/2018   Chest pain 10/15/2017   Hypertensive urgency 10/15/2017   Depression    Hypertension    Polycythemia 08/11/2012    REFERRING DIAG: REFERRING DIAG: R42 (ICD-10-CM) - Dizziness and giddiness  THERAPY DIAG:  BPPV (benign paroxysmal positional vertigo), bilateral  Dizziness and giddiness  Unsteadiness on feet  Rationale for Evaluation and Treatment Rehabilitation  PERTINENT HISTORY: PERTINENT HISTORY: anxiety, bladder CA, breast CA with R lumpectomy, CAD, endometrial CA, HTN, osteoporosis    SUBJECTIVE: Trying to move more slowly and deliberate when changing positions, no episodes of severe vertigo  PAIN:  Are you having pain? No   OBJECTIVE:    TODAY'S TREATMENT: 03/25/22 Activity Comments  Dix-Hallpike left No nystagmus, reports minimal disturbance  Dix-Hallpike  right Left downbeating? Very subtle and unsustained  Epley maneuver right ear   Dix-Hallpike right  No symptoms  M-CTSIB Severe LOB with condition 4  DGI 19/24  VOR x 1 horizontal and vertical Slip off target with horizontal    DIAGNOSTIC FINDINGS: none recent   COGNITION: Overall cognitive status: Within functional limits for tasks assessed             SENSATION: WFL   GAIT: Gait pattern:  slight antalgia and unsteadiness Assistive device utilized: None   PATIENT SURVEYS:  FOTO [id number]  M-CTSIB  Condition 1: Firm Surface, EO 30 Sec, Normal Sway  Condition 2: Firm Surface, EC 30 Sec, Mild Sway  Condition 3: Foam Surface, EO 30 Sec, Mild and Moderate Sway  Condition 4: Foam Surface, EC 12 Sec, Severe Sway          VESTIBULAR ASSESSMENT  GENERAL OBSERVATION: wearing progressive lenses which she wears most of the time                         OCULOMOTOR EXAM:                       Ocular Alignment: L eye slightly elevated                       Ocular ROM: No Limitations                       Spontaneous Nystagmus: absent                       Gaze-Induced Nystagmus: absent                       Smooth Pursuits: intact                       Saccades: intact                       Convergence/Divergence: ~5 inches               VESTIBULAR - OCULAR REFLEX:                        Slow VOR: Normal                       VOR Cancellation: Normal                       Head-Impulse Test: HIT Right: slightly positive HIT Left: positive *c/o dizziness                          POSITIONAL TESTING:  *c/o dizziness and indiscernable nystagmus evident in supine Right Roll Test: possible R upbeating torsional nystagmus and dizziness; Duration: 25 sec Left Roll Test: possible L upbeating torsional nystagmus and dizziness; Duration: 40 sec Right Dix-Hallpike: NT Left Dix-Hallpike: L upbeating torsional nystagmus; Duration: 20 sec   VESTIBULAR TREATMENT:   Canalith  Repositioning:                       Epley Left: Number of Reps: 2, Response to Treatment: symptoms improved, and Comment: upon R head turn patient with ~5 sec of R upbeating torsional nystagmus followed by R downbeating torsional nystagmus lasting ~20 sec   PATIENT EDUCATION: Education details: prognosis, POC, edu on exam results, anatomy and physiology of BPPV and vestibular hypofx Person educated: Patient Education method: Explanation Education comprehension: verbalized understanding and returned demonstration     GOALS: Goals reviewed with patient? Yes   SHORT TERM GOALS: Target date: 04/12/2022   Patient to be independent with initial HEP. Baseline: HEP initiated Goal status: INITIAL       LONG TERM GOALS: Target date: 04/19/2022   Patient to be independent with advanced HEP. Baseline: Not yet initiated  Goal status: INITIAL   Patient to report 0/10 dizziness with standing vertical and horizontal VOR at quick pae for 30 seconds. Baseline: see above Goal status: INITIAL   Patient will report 0/10 dizziness with bed mobility.  Baseline: Symptomatic  Goal status: INITIAL   Patient to demonstrate  mild-moderate sway with M-CTSIB condition with eyes closed/foam surface in order to improve safety in environments with uneven surfaces and dim lighting. Baseline: NT Goal status: INITIAL   Patient to score at least 20/24 on DGI in order to decrease risk of falls. Baseline: NT Goal status: INITIAL   Patient will ambulate over outdoor surfaces with LRAD while performing head turns to scan environment with good stability in order to indicate safe community mobility. Baseline: NT Goal status: INITIAL       ASSESSMENT:   CLINICAL IMPRESSION:  Reports about 80% improvement in positional symptoms since Friday when maneuvering was performed.  No nystagmus noted with left Dix-Hallpike and suspicious nystagmus in Right Dix-Hallpike thus proceeding with Epley maneuver.  Continued  with additional balance testing revealing vestibular dysfunction via M-CTSIB with LOB condition 2 and 4 and demonstrates significant pathway deviation during DGI with head turns left > right. Initiated VOR x 1 training for HEP with notable difficulty maintaining gaze stability with rotation, cues for slower speed and accuracy     OBJECTIVE IMPAIRMENTS Abnormal gait, decreased activity tolerance, decreased balance, and dizziness.    ACTIVITY LIMITATIONS carrying, lifting, bending, standing, squatting, sleeping, stairs, transfers, bed mobility, bathing, toileting, dressing, and reach over head   PARTICIPATION LIMITATIONS: meal prep, cleaning, laundry, driving, shopping, community activity, occupation, yard work, and church   PERSONAL FACTORS Age, Past/current experiences, Time since onset of injury/illness/exacerbation, and 3+ comorbidities: anxiety, bladder CA, breast CA with R lumpectomy, CAD, endometrial CA, HTN, osteoporosis  are also affecting patient's functional outcome.    REHAB POTENTIAL: Good   CLINICAL DECISION MAKING: Evolving/moderate complexity   EVALUATION COMPLEXITY: Moderate     PLAN: PT FREQUENCY: 1-2x/week   PT DURATION: 4 weeks   PLANNED INTERVENTIONS: Therapeutic exercises, Therapeutic activity, Neuromuscular re-education, Balance training, Gait training, Patient/Family education, Self Care, Joint mobilization, Stair training, Vestibular training, Canalith repositioning, Dry Needling, Cryotherapy, Moist heat, Taping, Manual therapy, and Re-evaluation   PLAN FOR NEXT SESSION: retest L and R DH, ; progress VOR and habituation HEP  10:16 AM, 03/25/22 M. Sherlyn Lees, PT, DPT Physical Therapist- [place] Office Number: [phone]

## 2022-03-28 ENCOUNTER — Ambulatory Visit: Payer: PPO | Admitting: Physical Therapy

## 2022-04-01 ENCOUNTER — Encounter: Payer: Self-pay | Admitting: Physical Therapy

## 2022-04-02 NOTE — Therapy (Signed)
OUTPATIENT PHYSICAL THERAPY TREATMENT NOTE   Patient Name: Danielle Knapp MRN: [id number] DOB:[birth year], 66 y.o., female Today's Date: 04/03/2022  PCP: Harlan Stains, MD  REFERRING PROVIDER: Willaim Rayas, [place]   END OF SESSION:   PT End of Session - 04/03/22 0920     Visit Number 3    Number of Visits 9    Date for PT Re-Evaluation 04/19/22    Authorization Type [employer]    PT Start Time 0845    PT Stop Time 0918    PT Time Calculation (min) 33 min    Activity Tolerance Patient tolerated treatment well    Behavior During Therapy [facility] for tasks assessed/performed              Past Medical History:  Diagnosis Date   Allergic rhinitis    Allergy    Anxiety    Bladder cancer ([place]) 10/17/2020   Borderline glaucoma, left    no eye drops   BPPV (benign paroxysmal positional vertigo)    Breast cancer ([place]) 01/23/2021   Cancer ([place]) 10/17/2020   Cataract    Chondromalacia of patella    left   Coronary artery disease    cardiac cath 11-03-2017 done for positive ETT,  showed very miminal nonobstrutive cad, ef 55-65%, non caridac chest pain   Diverticulosis of colon    Endometrial cancer ([place])    Family history of breast cancer 12/08/2020   Family history of ovarian cancer 12/08/2020   Family history of prostate cancer 12/08/2020   Frequency of urination    GERD (gastroesophageal reflux disease)    History of chest pain 10/14/2017   ED visit w/ admission,  hypertensive urgency;  follow up with dr Doylene Canard--- had outpatient ETT 10-21-2017 positive ischemia, echo 10-16-2017 G1DD, 60-65%;    s/p cardiac cath 11-03-2017 w/ very miminal nonobstructive cad , chest pain non cardiac   History of COVID-19 06/01/2021   History of diverticulitis    History of kidney stones    HSV-1 infection    fever blisters   Hx of adenomatous colonic polyps 02/08/2018   Hypertension    followed by pcp   MDD (major depressive disorder)    OA (osteoarthritis)    left knee, shoulder    Osteoporosis    Polycythemia    long hx followed by pcp and  sees hematology/ oncology--- dr Jerilynn Mages. Julien Nordmann on as needed basis, chronic and told to donate blood regularly   Stenosis of cervix    Uterine mass    Wears glasses    Past Surgical History:  Procedure Laterality Date   ABDOMINAL HYSTERECTOMY     BREAST LUMPECTOMY WITH RADIOACTIVE SEED LOCALIZATION Right 02/16/2021   Procedure: RIGHT BREAST LUMPECTOMY WITH RADIOACTIVE SEED LOCALIZATION;  Surgeon: Coralie Keens, MD;  Location: [facility];  Service: General;  Laterality: Right;   CATARACT EXTRACTION W/ INTRAOCULAR LENS  IMPLANT, BILATERAL  2013   COLONOSCOPY  02-02-2018 dr Carlean Purl   CYSTOSCOPY  10/17/2020   Procedure: CYSTOSCOPY;  Surgeon: Nunzio Cobbs, MD;  Location: [facility];  Service: Gynecology;;   CYSTOSCOPY N/A 11/09/2020   Procedure: Consuela Mimes;  Surgeon: Remi Haggard, MD;  Location: [facility];  Service: Urology;  Laterality: N/A;  Dr. Milford Cage needs to go first. [name] and rigid bx forceps.   DIAGNOSTIC LAPAROSCOPY  1996   DILATATION & CURETTAGE/HYSTEROSCOPY WITH MYOSURE N/A 10/17/2020   Procedure: DILATATION & CURETTAGE/HYSTEROSCOPY WITH  MYOSURE RESECTION OF ENDOMETRIAL MASS,  OPENING  OF CERVICAL STENOSIS, DRAINAGE OF UTERINE FLUID;  Surgeon: Nunzio Cobbs, MD;  Location: [facility];  Service: Gynecology;  Laterality: N/A;   EYE SURGERY     FRACTURE SURGERY     HEEL SPUR SURGERY Right 2015   Dr. Durward Fortes   HEMORRHOID SURGERY  2004   LEFT HEART CATH AND CORONARY ANGIOGRAPHY N/A 11/03/2017   Procedure: LEFT HEART CATH AND CORONARY ANGIOGRAPHY;  Surgeon: Jettie Booze, MD;  Location: [facility];  Service: Cardiovascular;  Laterality: N/A;   LYMPH NODE DISSECTION N/A 11/09/2020   Procedure: BILATERAL LYMPH NODE DISSECTION;MINI LAPAROTOMY;  Surgeon: Lafonda Mosses, MD;  Location: [facility];  Service: Gynecology;  Laterality: N/A;    OPERATIVE ULTRASOUND N/A 10/17/2020   Procedure: OPERATIVE ULTRASOUND;  Surgeon: Nunzio Cobbs, MD;  Location: [facility];  Service: Gynecology;  Laterality: N/A;   ROBOTIC ASSISTED TOTAL HYSTERECTOMY WITH BILATERAL SALPINGO OOPHERECTOMY Bilateral 11/09/2020   Procedure: XI ROBOTIC ASSISTED TOTAL HYSTERECTOMY WITH BILATERAL SALPINGO OOPHORECTOMY,;  Surgeon: Lafonda Mosses, MD;  Location: [facility];  Service: Gynecology;  Laterality: Bilateral;   SEPTOPLASTY  x2 1970's   TONSILLECTOMY  age 63   TRANSURETHRAL RESECTION OF BLADDER TUMOR N/A 11/09/2020   Procedure: TRANSURETHRAL RESECTION OF BLADDER TUMOR (TURBT);  Surgeon: Remi Haggard, MD;  Location: [facility];  Service: Urology;  Laterality: N/A;   Patient Active Problem List   Diagnosis Date Noted   Bladder cancer ([place]) 01/31/2021   Ductal carcinoma in situ (DCIS) of right breast 01/30/2021   Genetic testing 12/26/2020   Family history of ovarian cancer 12/08/2020   Family history of breast cancer 12/08/2020   Family history of prostate cancer 12/08/2020   Endometrial cancer ([place])    Hx of adenomatous colonic polyps 02/08/2018   Chest pain 10/15/2017   Hypertensive urgency 10/15/2017   Depression    Hypertension    Polycythemia 08/11/2012    REFERRING DIAG: REFERRING DIAG: R42 (ICD-10-CM) - Dizziness and giddiness  THERAPY DIAG:  BPPV (benign paroxysmal positional vertigo), bilateral  Dizziness and giddiness  Unsteadiness on feet  Rationale for Evaluation and Treatment Rehabilitation  PERTINENT HISTORY: PERTINENT HISTORY: anxiety, bladder CA, breast CA with R lumpectomy, CAD, endometrial CA, HTN, osteoporosis    SUBJECTIVE: "My crystals are out again." When laying on the R side it spins. However felt like it was better at last appointment.   PAIN:  Are you having pain? No, "just knees and that's normal"   OBJECTIVE:     TODAY'S TREATMENT: 04/03/22 Activity Comments  R DH R upbeating  torsional nystagmus lasting ~15 sec  R epley  Tolerated well, upon L head turn patient with small amplitude L upbeating nystagmus   R DH R downbeating torsional nystagmus very low amplitude and lasting ~25 sec  R epley   Tolerated well, upon L head turn patient with small amplitude L downbeating nystagmus   R DH Negative for dizziness; possible small R downbeating torsional nystagmus very low amplitude and lasting ~25 sec  R/L brandt daroff C/o mild dizziness to L    HOME EXERCISE PROGRAM Last updated: 04/03/22 Access Code: [email or web address] URL: [email or web address] Date: 04/03/2022 Prepared by: [facility]  Exercises - Standing Gaze Stabilization with Head Rotation  - 1 x daily - 5 x weekly - 2-3 reps - 30 sec hold - Brandt-Daroff Vestibular Exercise  - 1 x daily - 5 x weekly - 2  sets - 3-5 hold   PATIENT EDUCATION: Education details: edu on how habituation and VOR training can address BPPV and hypofunction; HEP Person educated: Patient Education method: Explanation, Demonstration, Tactile cues, Verbal cues, and Handouts Education comprehension: verbalized understanding and returned demonstration   Below measures were taken at time of initial evaluation unless otherwise specified:  DIAGNOSTIC FINDINGS: none recent   COGNITION: Overall cognitive status: Within functional limits for tasks assessed             SENSATION: WFL   GAIT: Gait pattern:  slight antalgia and unsteadiness Assistive device utilized: None   PATIENT SURVEYS:  FOTO [phone]  M-CTSIB  Condition 1: Firm Surface, EO 30 Sec, Normal Sway  Condition 2: Firm Surface, EC 30 Sec, Mild Sway  Condition 3: Foam Surface, EO 30 Sec, Mild and Moderate Sway  Condition 4: Foam Surface, EC 12 Sec, Severe Sway          VESTIBULAR ASSESSMENT              GENERAL OBSERVATION: wearing progressive lenses which she wears most of the time                         OCULOMOTOR  EXAM:                       Ocular Alignment: L eye slightly elevated                       Ocular ROM: No Limitations                       Spontaneous Nystagmus: absent                       Gaze-Induced Nystagmus: absent                       Smooth Pursuits: intact                       Saccades: intact                       Convergence/Divergence: ~5 inches               VESTIBULAR - OCULAR REFLEX:                        Slow VOR: Normal                       VOR Cancellation: Normal                       Head-Impulse Test: HIT Right: slightly positive HIT Left: positive *c/o dizziness                          POSITIONAL TESTING:  *c/o dizziness and indiscernable nystagmus evident in supine Right Roll Test: possible R upbeating torsional nystagmus and dizziness; Duration: 25 sec Left Roll Test: possible L upbeating torsional nystagmus and dizziness; Duration: 40 sec Right Dix-Hallpike: NT Left Dix-Hallpike: L upbeating torsional nystagmus; Duration: 20 sec   VESTIBULAR TREATMENT:   Canalith Repositioning:  Epley Left: Number of Reps: 2, Response to Treatment: symptoms improved, and Comment: upon R head turn patient with ~5 sec of R upbeating torsional nystagmus followed by R downbeating torsional nystagmus lasting ~20 sec   PATIENT EDUCATION: Education details: prognosis, POC, edu on exam results, anatomy and physiology of BPPV and vestibular hypofx Person educated: Patient Education method: Explanation Education comprehension: verbalized understanding and returned demonstration     GOALS: Goals reviewed with patient? Yes   SHORT TERM GOALS: Target date: 04/12/2022   Patient to be independent with initial HEP. Baseline: HEP initiated Goal status: IN PROGRESS       LONG TERM GOALS: Target date: 04/19/2022   Patient to be independent with advanced HEP. Baseline: Not yet initiated  Goal status: IN PROGRESS   Patient to report 0/10 dizziness  with standing vertical and horizontal VOR at quick pae for 30 seconds. Baseline: see above Goal status: IN PROGRESS   Patient will report 0/10 dizziness with bed mobility.  Baseline: Symptomatic  Goal status: IN PROGRESS   Patient to demonstrate mild-moderate sway with M-CTSIB condition with eyes closed/foam surface in order to improve safety in environments with uneven surfaces and dim lighting. Baseline: severe LOB Goal status: IN PROGRESS   Patient to score at least 20/24 on DGI in order to decrease risk of falls. Baseline: 19/24 Goal status: IN PROGRESS   Patient will ambulate over outdoor surfaces with LRAD while performing head turns to scan environment with good stability in order to indicate safe community mobility. Baseline: NT Goal status: IN PROGRESS       ASSESSMENT:   CLINICAL IMPRESSION: Patient arrived to session with report of feeling like "my crystals are out again." Reassessed R [facility] which was positive for R posterior cupulolithiasis. Treated with R Epley which resolved most symptoms, only with some downbeating torsional nystagmus noted on R and L side. Educated patient on Nestor Lewandowsky to address remaining symptoms. Patient reported understanding of HEP and without complaints at end of session.       OBJECTIVE IMPAIRMENTS Abnormal gait, decreased activity tolerance, decreased balance, and dizziness.    ACTIVITY LIMITATIONS carrying, lifting, bending, standing, squatting, sleeping, stairs, transfers, bed mobility, bathing, toileting, dressing, and reach over head   PARTICIPATION LIMITATIONS: meal prep, cleaning, laundry, driving, shopping, community activity, occupation, yard work, and church   PERSONAL FACTORS Age, Past/current experiences, Time since onset of injury/illness/exacerbation, and 3+ comorbidities: anxiety, bladder CA, breast CA with R lumpectomy, CAD, endometrial CA, HTN, osteoporosis  are also affecting patient's functional outcome.    REHAB  POTENTIAL: Good   CLINICAL DECISION MAKING: Evolving/moderate complexity   EVALUATION COMPLEXITY: Moderate     PLAN: PT FREQUENCY: 1-2x/week   PT DURATION: 4 weeks   PLANNED INTERVENTIONS: Therapeutic exercises, Therapeutic activity, Neuromuscular re-education, Balance training, Gait training, Patient/Family education, Self Care, Joint mobilization, Stair training, Vestibular training, Canalith repositioning, Dry Needling, Cryotherapy, Moist heat, Taping, Manual therapy, and Re-evaluation   PLAN FOR NEXT SESSION: retest L and R DH, ; progress VOR and habituation HEP   Janene Harvey, PT, DPT 04/03/22 9:22 AM  [facility] at [facility] [address], [place], [place] [address] Phone # [phone] Fax # [phone]

## 2022-04-03 ENCOUNTER — Ambulatory Visit: Payer: HMO | Attending: Student | Admitting: Physical Therapy

## 2022-04-03 ENCOUNTER — Encounter: Payer: Self-pay | Admitting: Physical Therapy

## 2022-04-03 DIAGNOSIS — R42 Dizziness and giddiness: Secondary | ICD-10-CM | POA: Diagnosis not present

## 2022-04-03 DIAGNOSIS — H8113 Benign paroxysmal vertigo, bilateral: Secondary | ICD-10-CM | POA: Insufficient documentation

## 2022-04-03 DIAGNOSIS — R2681 Unsteadiness on feet: Secondary | ICD-10-CM | POA: Insufficient documentation

## 2022-04-08 ENCOUNTER — Ambulatory Visit: Payer: HMO | Admitting: Physical Therapy

## 2022-04-09 NOTE — Therapy (Signed)
OUTPATIENT PHYSICAL THERAPY TREATMENT NOTE   Patient Name: Danielle Knapp MRN: [id number] DOB:04-Jan-1956, 66 y.o., female Today's Date: 04/10/2022  PCP: Harlan Stains, MD  REFERRING PROVIDER: Willaim Rayas, [place]   END OF SESSION:   PT End of Session - 04/10/22 0936     Visit Number 4    Number of Visits 9    Date for PT Re-Evaluation 04/19/22    Authorization Type [employer]    PT Start Time 0754    PT Stop Time 0845    PT Time Calculation (min) 51 min    Activity Tolerance Patient tolerated treatment well    Behavior During Therapy [facility] for tasks assessed/performed               Past Medical History:  Diagnosis Date   Allergic rhinitis    Allergy    Anxiety    Bladder cancer ([place]) 10/17/2020   Borderline glaucoma, left    no eye drops   BPPV (benign paroxysmal positional vertigo)    Breast cancer ([place]) 01/23/2021   Cancer ([place]) 10/17/2020   Cataract    Chondromalacia of patella    left   Coronary artery disease    cardiac cath 11-03-2017 done for positive ETT,  showed very miminal nonobstrutive cad, ef 55-65%, non caridac chest pain   Diverticulosis of colon    Endometrial cancer ([place])    Family history of breast cancer 12/08/2020   Family history of ovarian cancer 12/08/2020   Family history of prostate cancer 12/08/2020   Frequency of urination    GERD (gastroesophageal reflux disease)    History of chest pain 10/14/2017   ED visit w/ admission,  hypertensive urgency;  follow up with dr Doylene Canard--- had outpatient ETT 10-21-2017 positive ischemia, echo 10-16-2017 G1DD, 60-65%;    s/p cardiac cath 11-03-2017 w/ very miminal nonobstructive cad , chest pain non cardiac   History of COVID-19 06/01/2021   History of diverticulitis    History of kidney stones    HSV-1 infection    fever blisters   Hx of adenomatous colonic polyps 02/08/2018   Hypertension    followed by pcp   MDD (major depressive disorder)    OA (osteoarthritis)    left knee,  shoulder   Osteoporosis    Polycythemia    long hx followed by pcp and  sees hematology/ oncology--- dr Jerilynn Mages. Julien Nordmann on as needed basis, chronic and told to donate blood regularly   Stenosis of cervix    Uterine mass    Wears glasses    Past Surgical History:  Procedure Laterality Date   ABDOMINAL HYSTERECTOMY     BREAST LUMPECTOMY WITH RADIOACTIVE SEED LOCALIZATION Right 02/16/2021   Procedure: RIGHT BREAST LUMPECTOMY WITH RADIOACTIVE SEED LOCALIZATION;  Surgeon: Coralie Keens, MD;  Location: [place];  Service: General;  Laterality: Right;   CATARACT EXTRACTION W/ INTRAOCULAR LENS  IMPLANT, BILATERAL  2013   COLONOSCOPY  02-02-2018 dr Carlean Purl   CYSTOSCOPY  10/17/2020   Procedure: CYSTOSCOPY;  Surgeon: Nunzio Cobbs, MD;  Location: [facility];  Service: Gynecology;;   CYSTOSCOPY N/A 11/09/2020   Procedure: Consuela Mimes;  Surgeon: Remi Haggard, MD;  Location: [facility];  Service: Urology;  Laterality: N/A;  Dr. Milford Cage needs to go first. [name] and rigid bx forceps.   DIAGNOSTIC LAPAROSCOPY  1996   DILATATION & CURETTAGE/HYSTEROSCOPY WITH MYOSURE N/A 10/17/2020   Procedure: DILATATION & CURETTAGE/HYSTEROSCOPY WITH  MYOSURE RESECTION OF ENDOMETRIAL MASS,  OPENING OF CERVICAL STENOSIS, DRAINAGE OF UTERINE FLUID;  Surgeon: Nunzio Cobbs, MD;  Location: [facility];  Service: Gynecology;  Laterality: N/A;   EYE SURGERY     FRACTURE SURGERY     HEEL SPUR SURGERY Right 2015   Dr. Durward Fortes   HEMORRHOID SURGERY  2004   LEFT HEART CATH AND CORONARY ANGIOGRAPHY N/A 11/03/2017   Procedure: LEFT HEART CATH AND CORONARY ANGIOGRAPHY;  Surgeon: Jettie Booze, MD;  Location: [facility];  Service: Cardiovascular;  Laterality: N/A;   LYMPH NODE DISSECTION N/A 11/09/2020   Procedure: BILATERAL LYMPH NODE DISSECTION;MINI LAPAROTOMY;  Surgeon: Lafonda Mosses, MD;  Location: [facility];  Service: Gynecology;  Laterality: N/A;    OPERATIVE ULTRASOUND N/A 10/17/2020   Procedure: OPERATIVE ULTRASOUND;  Surgeon: Nunzio Cobbs, MD;  Location[facility];  Service: Gynecology;  Laterality: N/A;   ROBOTIC ASSISTED TOTAL HYSTERECTOMY WITH BILATERAL SALPINGO OOPHERECTOMY Bilateral 11/09/2020   Procedure: XI ROBOTIC ASSISTED TOTAL HYSTERECTOMY WITH BILATERAL SALPINGO OOPHORECTOMY,;  Surgeon: Lafonda Mosses, MD;  Location: [facility];  Service: Gynecology;  Laterality: Bilateral;   SEPTOPLASTY  x2 1970's   TONSILLECTOMY  age 34   TRANSURETHRAL RESECTION OF BLADDER TUMOR N/A 11/09/2020   Procedure: TRANSURETHRAL RESECTION OF BLADDER TUMOR (TURBT);  Surgeon: Remi Haggard, MD;  Location: [facility];  Service: Urology;  Laterality: N/A;   Patient Active Problem List   Diagnosis Date Noted   Bladder cancer ([place]) 01/31/2021   Ductal carcinoma in situ (DCIS) of right breast 01/30/2021   Genetic testing 12/26/2020   Family history of ovarian cancer 12/08/2020   Family history of breast cancer 12/08/2020   Family history of prostate cancer 12/08/2020   Endometrial cancer ([place])    Hx of adenomatous colonic polyps 02/08/2018   Chest pain 10/15/2017   Hypertensive urgency 10/15/2017   Depression    Hypertension    Polycythemia 08/11/2012    REFERRING DIAG: REFERRING DIAG: R42 (ICD-10-CM) - Dizziness and giddiness  THERAPY DIAG:  BPPV (benign paroxysmal positional vertigo), bilateral  Dizziness and giddiness  Unsteadiness on feet  Rationale for Evaluation and Treatment Rehabilitation  PERTINENT HISTORY: PERTINENT HISTORY: anxiety, bladder CA, breast CA with R lumpectomy, CAD, endometrial CA, HTN, osteoporosis    SUBJECTIVE: Having a hard time with the exercise laying down, particularly on the R side. Bumping into things and catching herself from falling lately- thinking about going on FMLA. Denies falls. Has had a HA for about 3 days.   PAIN:  Are you having pain? Yes: NPRS scale: 3-4/10 Pain  location: forehead Pain description: achiness Aggravating factors: light Relieving factors: -   OBJECTIVE:       TODAY'S TREATMENT: 04/10/22 Activity Comments  R DH  R upbeating torsional nystagmus lasting 40 sec, followed by downbeating R torsional nystagmus lower amplitude 10 sec  R Epley Undiscernable nystagmus upon L head turn lasting ~15 sec; tolerated well  R DH downbeating R torsional nystagmus lower amplitude 15 sec   R Epley  Tolerated well  Standing horizontal VOR 30" Good speed; no dizziness   1/2 turns to targets  Good speed; no dizziness   1/2 turns to targets + alt toe tap on cone Usteadiness requiring occasional min A  Romberg 2x30" Mild-mod sway  Standing EC head turns/nods 30" Good stability   Sitting anterior habituation with EO/EC 1x each No dizziness   D2 flexion ball to cone 5x each Very mild unsteadiness; no  symptoms     HOME EXERCISE PROGRAM Last updated: 04/10/22 Access Code: [email or web address] URL: [email or web address] Date: 04/10/2022 Prepared by: [facility] - Outpatient  Rehab - [facility]  Exercises - Standing Toe Taps  - 1 x daily - 5 x weekly - 2 sets - 10 reps - Romberg Stance with Eyes Closed  - 1 x daily - 5 x weekly - 2 sets - 30 sec hold   PATIENT EDUCATION: Education details: provided handout on Epley and provided detailed instruction on how to properly perform if spinning sensation to R side returns; edu on vestibular labyrinth anatomy and how it relates to BPPV; update to HEP Person educated: Patient Education method: Explanation, Demonstration, Tactile cues, Verbal cues, and Handouts Education comprehension: verbalized understanding and returned demonstration   Below measures were taken at time of initial evaluation unless otherwise specified:  DIAGNOSTIC FINDINGS: none recent   COGNITION: Overall cognitive status: Within functional limits for tasks assessed             SENSATION: WFL   GAIT: Gait pattern:   slight antalgia and unsteadiness Assistive device utilized: None   PATIENT SURVEYS:  FOTO [phone]  M-CTSIB  Condition 1: Firm Surface, EO 30 Sec, Normal Sway  Condition 2: Firm Surface, EC 30 Sec, Mild Sway  Condition 3: Foam Surface, EO 30 Sec, Mild and Moderate Sway  Condition 4: Foam Surface, EC 12 Sec, Severe Sway          VESTIBULAR ASSESSMENT              GENERAL OBSERVATION: wearing progressive lenses which she wears most of the time                         OCULOMOTOR EXAM:                       Ocular Alignment: L eye slightly elevated                       Ocular ROM: No Limitations                       Spontaneous Nystagmus: absent                       Gaze-Induced Nystagmus: absent                       Smooth Pursuits: intact                       Saccades: intact                       Convergence/Divergence: ~5 inches               VESTIBULAR - OCULAR REFLEX:                        Slow VOR: Normal                       VOR Cancellation: Normal                       Head-Impulse Test: HIT Right: slightly positive HIT Left: positive *c/o dizziness  POSITIONAL TESTING:  *c/o dizziness and indiscernable nystagmus evident in supine Right Roll Test: possible R upbeating torsional nystagmus and dizziness; Duration: 25 sec Left Roll Test: possible L upbeating torsional nystagmus and dizziness; Duration: 40 sec Right Dix-Hallpike: NT Left Dix-Hallpike: L upbeating torsional nystagmus; Duration: 20 sec   VESTIBULAR TREATMENT:   Canalith Repositioning:                       Epley Left: Number of Reps: 2, Response to Treatment: symptoms improved, and Comment: upon R head turn patient with ~5 sec of R upbeating torsional nystagmus followed by R downbeating torsional nystagmus lasting ~20 sec   PATIENT EDUCATION: Education details: prognosis, POC, edu on exam results, anatomy and physiology of BPPV and vestibular hypofx Person educated:  Patient Education method: Explanation Education comprehension: verbalized understanding and returned demonstration     GOALS: Goals reviewed with patient? Yes   SHORT TERM GOALS: Target date: 04/12/2022   Patient to be independent with initial HEP. Baseline: HEP initiated Goal status: IN PROGRESS       LONG TERM GOALS: Target date: 04/19/2022   Patient to be independent with advanced HEP. Baseline: Not yet initiated  Goal status: IN PROGRESS   Patient to report 0/10 dizziness with standing vertical and horizontal VOR at quick pae for 30 seconds. Baseline: see above Goal status: IN PROGRESS   Patient will report 0/10 dizziness with bed mobility.  Baseline: Symptomatic  Goal status: IN PROGRESS   Patient to demonstrate mild-moderate sway with M-CTSIB condition with eyes closed/foam surface in order to improve safety in environments with uneven surfaces and dim lighting. Baseline: severe LOB Goal status: IN PROGRESS   Patient to score at least 20/24 on DGI in order to decrease risk of falls. Baseline: 19/24 Goal status: IN PROGRESS   Patient will ambulate over outdoor surfaces with LRAD while performing head turns to scan environment with good stability in order to indicate safe community mobility. Baseline: NT Goal status: IN PROGRESS       ASSESSMENT:   CLINICAL IMPRESSION: Patient arrived to session with report of remaining dizziness with movements to the R as well as imbalance. R DH was positive for R posterior canalithiasis and possible L anterior canalithiasis. Treated with Epley, which resolved symptoms. Remainder of session was spent on balance, habituation, and VOR activities. Patient tolerated VOR well. Imbalance evident with balance with EC and SLS, thus updated this into HEP to perform safely at counter top. Also educated patient on self-epley to be performed with husband's assist PRN. Patient reported understanding of all edu provided today and without  complaints at end of session.      OBJECTIVE IMPAIRMENTS Abnormal gait, decreased activity tolerance, decreased balance, and dizziness.    ACTIVITY LIMITATIONS carrying, lifting, bending, standing, squatting, sleeping, stairs, transfers, bed mobility, bathing, toileting, dressing, and reach over head   PARTICIPATION LIMITATIONS: meal prep, cleaning, laundry, driving, shopping, community activity, occupation, yard work, and church   PERSONAL FACTORS Age, Past/current experiences, Time since onset of injury/illness/exacerbation, and 3+ comorbidities: anxiety, bladder CA, breast CA with R lumpectomy, CAD, endometrial CA, HTN, osteoporosis  are also affecting patient's functional outcome.    REHAB POTENTIAL: Good   CLINICAL DECISION MAKING: Evolving/moderate complexity   EVALUATION COMPLEXITY: Moderate     PLAN: PT FREQUENCY: 1-2x/week   PT DURATION: 4 weeks   PLANNED INTERVENTIONS: Therapeutic exercises, Therapeutic activity, Neuromuscular re-education, Balance training, Gait training, Patient/Family education, Self Care, Joint mobilization, Stair training,  Vestibular training, Canalith repositioning, Dry Needling, Cryotherapy, Moist heat, Taping, Manual therapy, and Re-evaluation   PLAN FOR NEXT SESSION: retest L and R DH, ; progress VOR and habituation HEP   Janene Harvey, PT, DPT 04/10/22 9:45 AM  [facility] [address], [place], [place] [address] Phone # [phone] Fax # [phone]

## 2022-04-10 ENCOUNTER — Ambulatory Visit: Payer: HMO | Admitting: Physical Therapy

## 2022-04-10 ENCOUNTER — Encounter: Payer: Self-pay | Admitting: Physical Therapy

## 2022-04-10 DIAGNOSIS — H8113 Benign paroxysmal vertigo, bilateral: Secondary | ICD-10-CM

## 2022-04-10 DIAGNOSIS — R2681 Unsteadiness on feet: Secondary | ICD-10-CM

## 2022-04-10 DIAGNOSIS — R42 Dizziness and giddiness: Secondary | ICD-10-CM

## 2022-04-12 NOTE — Therapy (Signed)
OUTPATIENT PHYSICAL THERAPY TREATMENT NOTE   Patient Name: Danielle Knapp MRN: [id number] DOB:08/30/1956, 66 y.o., female Today's Date: 04/15/2022  PCP: Harlan Stains, MD  REFERRING PROVIDER: Willaim Rayas, [place]   END OF SESSION:   PT End of Session - 04/15/22 0844     Visit Number 5    Number of Visits 9    Date for PT Re-Evaluation 04/19/22    Authorization Type [employer]    PT Start Time 0801    PT Stop Time 0841    PT Time Calculation (min) 40 min    Equipment Utilized During Treatment Gait belt    Activity Tolerance Patient tolerated treatment well    Behavior During Therapy [facility] for tasks assessed/performed                Past Medical History:  Diagnosis Date   Allergic rhinitis    Allergy    Anxiety    Bladder cancer ([place]) 10/17/2020   Borderline glaucoma, left    no eye drops   BPPV (benign paroxysmal positional vertigo)    Breast cancer ([place]) 01/23/2021   Cancer ([place]) 10/17/2020   Cataract    Chondromalacia of patella    left   Coronary artery disease    cardiac cath 11-03-2017 done for positive ETT,  showed very miminal nonobstrutive cad, ef 55-65%, non caridac chest pain   Diverticulosis of colon    Endometrial cancer ([place])    Family history of breast cancer 12/08/2020   Family history of ovarian cancer 12/08/2020   Family history of prostate cancer 12/08/2020   Frequency of urination    GERD (gastroesophageal reflux disease)    History of chest pain 10/14/2017   ED visit w/ admission,  hypertensive urgency;  follow up with dr Doylene Canard--- had outpatient ETT 10-21-2017 positive ischemia, echo 10-16-2017 G1DD, 60-65%;    s/p cardiac cath 11-03-2017 w/ very miminal nonobstructive cad , chest pain non cardiac   History of COVID-19 06/01/2021   History of diverticulitis    History of kidney stones    HSV-1 infection    fever blisters   Hx of adenomatous colonic polyps 02/08/2018   Hypertension    followed by pcp   MDD (major depressive  disorder)    OA (osteoarthritis)    left knee, shoulder   Osteoporosis    Polycythemia    long hx followed by pcp and  sees hematology/ oncology--- dr Jerilynn Mages. Julien Nordmann on as needed basis, chronic and told to donate blood regularly   Stenosis of cervix    Uterine mass    Wears glasses    Past Surgical History:  Procedure Laterality Date   ABDOMINAL HYSTERECTOMY     BREAST LUMPECTOMY WITH RADIOACTIVE SEED LOCALIZATION Right 02/16/2021   Procedure: RIGHT BREAST LUMPECTOMY WITH RADIOACTIVE SEED LOCALIZATION;  Surgeon: Coralie Keens, MD;  Location: [place];  Service: General;  Laterality: Right;   CATARACT EXTRACTION W/ INTRAOCULAR LENS  IMPLANT, BILATERAL  2013   COLONOSCOPY  02-02-2018 dr Carlean Purl   CYSTOSCOPY  10/17/2020   Procedure: CYSTOSCOPY;  Surgeon: Nunzio Cobbs, MD;  [facility];  Service: Gynecology;;   CYSTOSCOPY N/A 11/09/2020   Procedure: Consuela Mimes;  Surgeon: Remi Haggard, MD;  Location: [facility];  Service: Urology;  Laterality: N/A;  Dr. Milford Cage needs to go first. [name] and rigid bx forceps.   DIAGNOSTIC LAPAROSCOPY  1996   DILATATION & CURETTAGE/HYSTEROSCOPY WITH MYOSURE N/A 10/17/2020   Procedure: DILATATION &  CURETTAGE/HYSTEROSCOPY WITH  MYOSURE RESECTION OF ENDOMETRIAL MASS,  OPENING OF CERVICAL STENOSIS, DRAINAGE OF UTERINE FLUID;  Surgeon: Nunzio Cobbs, MD;  Location: [facility];  Service: Gynecology;  Laterality: N/A;   EYE SURGERY     FRACTURE SURGERY     HEEL SPUR SURGERY Right 2015   Dr. Durward Fortes   HEMORRHOID SURGERY  2004   LEFT HEART CATH AND CORONARY ANGIOGRAPHY N/A 11/03/2017   Procedure: LEFT HEART CATH AND CORONARY ANGIOGRAPHY;  Surgeon: Jettie Booze, MD;  Location: [facility];  Service: Cardiovascular;  Laterality: N/A;   LYMPH NODE DISSECTION N/A 11/09/2020   Procedure: BILATERAL LYMPH NODE DISSECTION;MINI LAPAROTOMY;  Surgeon: Lafonda Mosses, MD;  Location:  [facility];  Service: Gynecology;  Laterality: N/A;   OPERATIVE ULTRASOUND N/A 10/17/2020   Procedure: OPERATIVE ULTRASOUND;  Surgeon: Nunzio Cobbs, MD;  Location: [facility];  Service: Gynecology;  Laterality: N/A;   ROBOTIC ASSISTED TOTAL HYSTERECTOMY WITH BILATERAL SALPINGO OOPHERECTOMY Bilateral 11/09/2020   Procedure: XI ROBOTIC ASSISTED TOTAL HYSTERECTOMY WITH BILATERAL SALPINGO OOPHORECTOMY,;  Surgeon: Lafonda Mosses, MD;  Location: [facility];  Service: Gynecology;  Laterality: Bilateral;   SEPTOPLASTY  x2 1970's   TONSILLECTOMY  age 59   TRANSURETHRAL RESECTION OF BLADDER TUMOR N/A 11/09/2020   Procedure: TRANSURETHRAL RESECTION OF BLADDER TUMOR (TURBT);  Surgeon: Remi Haggard, MD;  Location: [facility];  Service: Urology;  Laterality: N/A;   Patient Active Problem List   Diagnosis Date Noted   Bladder cancer ([place]) 01/31/2021   Ductal carcinoma in situ (DCIS) of right breast 01/30/2021   Genetic testing 12/26/2020   Family history of ovarian cancer 12/08/2020   Family history of breast cancer 12/08/2020   Family history of prostate cancer 12/08/2020   Endometrial cancer ([place])    Hx of adenomatous colonic polyps 02/08/2018   Chest pain 10/15/2017   Hypertensive urgency 10/15/2017   Depression    Hypertension    Polycythemia 08/11/2012    REFERRING DIAG: REFERRING DIAG: R42 (ICD-10-CM) - Dizziness and giddiness  THERAPY DIAG:  BPPV (benign paroxysmal positional vertigo), bilateral  Dizziness and giddiness  Unsteadiness on feet  Rationale for Evaluation and Treatment Rehabilitation  PERTINENT HISTORY: PERTINENT HISTORY: anxiety, bladder CA, breast CA with R lumpectomy, CAD, endometrial CA, HTN, osteoporosis    SUBJECTIVE: Not having any spinning anymore. Did get a COVID shot on Thursday so hasn't been as active since then d/t the side effects.   PAIN:  Are you having pain? No   OBJECTIVE:      TODAY'S TREATMENT: 04/15/22 Activity  Comments  R DH 1-2 beats R upbeating torsional nystagmus followed by R downbeating torsional lasting ~30 sec   L DH Subtle L upbeating torional nystagmus lasting ~30 sec  L Epley  Tolerated well   L DH Subtle L upbeating torional nystagmus lasting ~20 sec  R/L brandt daroff 1x Good tolerance   Alt toe tap on cone; single tap 20x, double tap 10x Cues to shift onto standing LE  romberg EO/EC foam, wide stance on foam 30" each R/L severe sway romberg EC, better in wide stance   standing on foam head turns/nods 30" each C/o more difficulty with head turns   fwd/back stepping + head nods to targets  C/o feeling wobbly; moderate imbalance        HOME EXERCISE PROGRAM Last updated: 04/15/22 Access Code: [email or web address] URL: [email or web address] Date: 04/15/2022 Prepared by: [facility] - Outpatient  Rehab -  [facility]  Exercises - Brandt-Daroff Vestibular Exercise  - 1 x daily - 5 x weekly - 2 sets - 3-5 reps - Standing Toe Taps  - 1 x daily - 5 x weekly - 2 sets - 10 reps - Standing Balance with Eyes Closed on Foam  - 1 x daily - 5 x weekly - 2-3 sets - 30 sec hold - Standing with Head Rotation  - 1 x daily - 5 x weekly - 2-3 sets - 30 sec hold   PATIENT EDUCATION: Education details: HEP update Person educated: Patient Education method: Explanation, Demonstration, Tactile cues, Verbal cues, and Handouts Education comprehension: verbalized understanding and returned demonstration   Below measures were taken at time of initial evaluation unless otherwise specified:  DIAGNOSTIC FINDINGS: none recent   COGNITION: Overall cognitive status: Within functional limits for tasks assessed             SENSATION: WFL   GAIT: Gait pattern:  slight antalgia and unsteadiness Assistive device utilized: None   PATIENT SURVEYS:  FOTO [phone]  M-CTSIB  Condition 1: Firm Surface, EO 30 Sec, Normal Sway  Condition 2: Firm Surface, EC 30 Sec, Mild Sway  Condition 3: Foam Surface,  EO 30 Sec, Mild and Moderate Sway  Condition 4: Foam Surface, EC 12 Sec, Severe Sway          VESTIBULAR ASSESSMENT              GENERAL OBSERVATION: wearing progressive lenses which she wears most of the time                         OCULOMOTOR EXAM:                       Ocular Alignment: L eye slightly elevated                       Ocular ROM: No Limitations                       Spontaneous Nystagmus: absent                       Gaze-Induced Nystagmus: absent                       Smooth Pursuits: intact                       Saccades: intact                       Convergence/Divergence: ~5 inches               VESTIBULAR - OCULAR REFLEX:                        Slow VOR: Normal                       VOR Cancellation: Normal                       Head-Impulse Test: HIT Right: slightly positive HIT Left: positive *c/o dizziness                          POSITIONAL TESTING:  *c/o dizziness and  indiscernable nystagmus evident in supine Right Roll Test: possible R upbeating torsional nystagmus and dizziness; Duration: 25 sec Left Roll Test: possible L upbeating torsional nystagmus and dizziness; Duration: 40 sec Right Dix-Hallpike: NT Left Dix-Hallpike: L upbeating torsional nystagmus; Duration: 20 sec   VESTIBULAR TREATMENT:   Canalith Repositioning:                       Epley Left: Number of Reps: 2, Response to Treatment: symptoms improved, and Comment: upon R head turn patient with ~5 sec of R upbeating torsional nystagmus followed by R downbeating torsional nystagmus lasting ~20 sec   PATIENT EDUCATION: Education details: prognosis, POC, edu on exam results, anatomy and physiology of BPPV and vestibular hypofx Person educated: Patient Education method: Explanation Education comprehension: verbalized understanding and returned demonstration     GOALS: Goals reviewed with patient? Yes   SHORT TERM GOALS: Target date: 04/12/2022   Patient to be independent with  initial HEP. Baseline: HEP initiated Goal status: IN PROGRESS       LONG TERM GOALS: Target date: 04/19/2022   Patient to be independent with advanced HEP. Baseline: Not yet initiated  Goal status: IN PROGRESS   Patient to report 0/10 dizziness with standing vertical and horizontal VOR at quick pae for 30 seconds. Baseline: see above Goal status: IN PROGRESS   Patient will report 0/10 dizziness with bed mobility.  Baseline: Symptomatic  Goal status: IN PROGRESS   Patient to demonstrate mild-moderate sway with M-CTSIB condition with eyes closed/foam surface in order to improve safety in environments with uneven surfaces and dim lighting. Baseline: severe LOB Goal status: IN PROGRESS   Patient to score at least 20/24 on DGI in order to decrease risk of falls. Baseline: 19/24 Goal status: IN PROGRESS   Patient will ambulate over outdoor surfaces with LRAD while performing head turns to scan environment with good stability in order to indicate safe community mobility. Baseline: NT Goal status: IN PROGRESS       ASSESSMENT:   CLINICAL IMPRESSION: Patient arrived to session with report of resolution of spinning sensation. Positional testing revealed improved nystagmus with R DH, mild remaining nystagmus on L. Treated with L Epley which was tolerated well. Reviewed habituation to address remainder of symptoms and able to progress balance activities. Patient able to tolerate more challenging exercises with no c/o dizziness but with mild-moderate imbalance. Updated HEP with exercises that were safely tolerated today. Patient reported understanding of edu and no complaints at end of session.      OBJECTIVE IMPAIRMENTS Abnormal gait, decreased activity tolerance, decreased balance, and dizziness.    ACTIVITY LIMITATIONS carrying, lifting, bending, standing, squatting, sleeping, stairs, transfers, bed mobility, bathing, toileting, dressing, and reach over head   PARTICIPATION  LIMITATIONS: meal prep, cleaning, laundry, driving, shopping, community activity, occupation, yard work, and church   PERSONAL FACTORS Age, Past/current experiences, Time since onset of injury/illness/exacerbation, and 3+ comorbidities: anxiety, bladder CA, breast CA with R lumpectomy, CAD, endometrial CA, HTN, osteoporosis  are also affecting patient's functional outcome.    REHAB POTENTIAL: Good   CLINICAL DECISION MAKING: Evolving/moderate complexity   EVALUATION COMPLEXITY: Moderate     PLAN: PT FREQUENCY: 1-2x/week   PT DURATION: 4 weeks   PLANNED INTERVENTIONS: Therapeutic exercises, Therapeutic activity, Neuromuscular re-education, Balance training, Gait training, Patient/Family education, Self Care, Joint mobilization, Stair training, Vestibular training, Canalith repositioning, Dry Needling, Cryotherapy, Moist heat, Taping, Manual therapy, and Re-evaluation   PLAN FOR NEXT SESSION: progress VOR and  habituation, balance    Janene Harvey, PT, DPT 04/15/22 8:45 AM  [facility] at [facility] [address], [place], [place] [address] Phone # [phone] Fax # [phone]

## 2022-04-15 ENCOUNTER — Encounter: Payer: Self-pay | Admitting: Physical Therapy

## 2022-04-15 ENCOUNTER — Ambulatory Visit: Payer: HMO | Admitting: Physical Therapy

## 2022-04-15 DIAGNOSIS — R2681 Unsteadiness on feet: Secondary | ICD-10-CM

## 2022-04-15 DIAGNOSIS — H8113 Benign paroxysmal vertigo, bilateral: Secondary | ICD-10-CM

## 2022-04-15 DIAGNOSIS — R42 Dizziness and giddiness: Secondary | ICD-10-CM

## 2022-04-16 DIAGNOSIS — R42 Dizziness and giddiness: Secondary | ICD-10-CM | POA: Diagnosis not present

## 2022-04-17 NOTE — Therapy (Signed)
OUTPATIENT PHYSICAL THERAPY TREATMENT NOTE   Patient Name: Danielle Knapp MRN: [id number] DOB:[birth year], 66 y.o., female Today's Date: 04/17/2022  PCP: Harlan Stains, MD  REFERRING PROVIDER: Willaim Rayas, PA   END OF SESSION:        Past Medical History:  Diagnosis Date   Allergic rhinitis    Allergy    Anxiety    Bladder cancer ([place]) 10/17/2020   Borderline glaucoma, left    no eye drops   BPPV (benign paroxysmal positional vertigo)    Breast cancer ([place]) 01/23/2021   Cancer ([place]) 10/17/2020   Cataract    Chondromalacia of patella    left   Coronary artery disease    cardiac cath 11-03-2017 done for positive ETT,  showed very miminal nonobstrutive cad, ef 55-65%, non caridac chest pain   Diverticulosis of colon    Endometrial cancer ([employer])    Family history of breast cancer 12/08/2020   Family history of ovarian cancer 12/08/2020   Family history of prostate cancer 12/08/2020   Frequency of urination    GERD (gastroesophageal reflux disease)    History of chest pain 10/14/2017   ED visit w/ admission,  hypertensive urgency;  follow up with dr Doylene Canard--- had outpatient ETT 10-21-2017 positive ischemia, echo 10-16-2017 G1DD, 60-65%;    s/p cardiac cath 11-03-2017 w/ very miminal nonobstructive cad , chest pain non cardiac   History of COVID-19 06/01/2021   History of diverticulitis    History of kidney stones    HSV-1 infection    fever blisters   Hx of adenomatous colonic polyps 02/08/2018   Hypertension    followed by pcp   MDD (major depressive disorder)    OA (osteoarthritis)    left knee, shoulder   Osteoporosis    Polycythemia    long hx followed by pcp and  sees hematology/ oncology--- dr Jerilynn Mages. Julien Nordmann on as needed basis, chronic and told to donate blood regularly   Stenosis of cervix    Uterine mass    Wears glasses    Past Surgical History:  Procedure Laterality Date   ABDOMINAL HYSTERECTOMY     BREAST LUMPECTOMY WITH RADIOACTIVE SEED  LOCALIZATION Right 02/16/2021   Procedure: RIGHT BREAST LUMPECTOMY WITH RADIOACTIVE SEED LOCALIZATION;  Surgeon: Coralie Keens, MD;  Location: [facility];  Service: General;  Laterality: Right;   CATARACT EXTRACTION W/ INTRAOCULAR LENS  IMPLANT, BILATERAL  2013   COLONOSCOPY  02-02-2018 dr Carlean Purl   CYSTOSCOPY  10/17/2020   Procedure: CYSTOSCOPY;  Surgeon: Nunzio Cobbs, MD;  Location[facility];  Service: Gynecology;;   CYSTOSCOPY N/A 11/09/2020   Procedure: Consuela Mimes;  Surgeon: Remi Haggard, MD;  Location: [facility];  Service: Urology;  Laterality: N/A;  Dr. Milford Cage needs to go first. [name] and rigid bx forceps.   DIAGNOSTIC LAPAROSCOPY  1996   DILATATION & CURETTAGE/HYSTEROSCOPY WITH MYOSURE N/A 10/17/2020   Procedure: DILATATION & CURETTAGE/HYSTEROSCOPY WITH  MYOSURE RESECTION OF ENDOMETRIAL MASS,  OPENING OF CERVICAL STENOSIS, DRAINAGE OF UTERINE FLUID;  Surgeon: Nunzio Cobbs, MD;  Location[facility];  Service: Gynecology;  Laterality: N/A;   EYE SURGERY     FRACTURE SURGERY     HEEL SPUR SURGERY Right 2015   Dr. Durward Fortes   HEMORRHOID SURGERY  2004   LEFT HEART CATH AND CORONARY ANGIOGRAPHY N/A 11/03/2017   Procedure: LEFT HEART CATH AND CORONARY ANGIOGRAPHY;  Surgeon: Jettie Booze, MD;  Location: [facility];  Service: Cardiovascular;  Laterality: N/A;   LYMPH NODE DISSECTION N/A 11/09/2020   Procedure: BILATERAL LYMPH NODE DISSECTION;MINI LAPAROTOMY;  Surgeon: Lafonda Mosses, MD;  Location: [facility];  Service: Gynecology;  Laterality: N/A;   OPERATIVE ULTRASOUND N/A 10/17/2020   Procedure: OPERATIVE ULTRASOUND;  Surgeon: Nunzio Cobbs, MD;  Location: [facility];  Service: Gynecology;  Laterality: N/A;   ROBOTIC ASSISTED TOTAL HYSTERECTOMY WITH BILATERAL SALPINGO OOPHERECTOMY Bilateral 11/09/2020   Procedure: XI ROBOTIC ASSISTED TOTAL HYSTERECTOMY WITH BILATERAL  SALPINGO OOPHORECTOMY,;  Surgeon: Lafonda Mosses, MD;  Location: [facility];  Service: Gynecology;  Laterality: Bilateral;   SEPTOPLASTY  x2 1970's   TONSILLECTOMY  age 5   TRANSURETHRAL RESECTION OF BLADDER TUMOR N/A 11/09/2020   Procedure: TRANSURETHRAL RESECTION OF BLADDER TUMOR (TURBT);  Surgeon: Remi Haggard, MD;  Location: [facility];  Service: Urology;  Laterality: N/A;   Patient Active Problem List   Diagnosis Date Noted   Bladder cancer ([place]) 01/31/2021   Ductal carcinoma in situ (DCIS) of right breast 01/30/2021   Genetic testing 12/26/2020   Family history of ovarian cancer 12/08/2020   Family history of breast cancer 12/08/2020   Family history of prostate cancer 12/08/2020   Endometrial cancer ([place])    Hx of adenomatous colonic polyps 02/08/2018   Chest pain 10/15/2017   Hypertensive urgency 10/15/2017   Depression    Hypertension    Polycythemia 08/11/2012    REFERRING DIAG: REFERRING DIAG: R42 (ICD-10-CM) - Dizziness and giddiness  THERAPY DIAG:  No diagnosis found.  Rationale for Evaluation and Treatment Rehabilitation  PERTINENT HISTORY: PERTINENT HISTORY: anxiety, bladder CA, breast CA with R lumpectomy, CAD, endometrial CA, HTN, osteoporosis    SUBJECTIVE: Not having any spinning anymore. Did get a COVID shot on Thursday so hasn't been as active since then d/t the side effects.   PAIN:  Are you having pain? No   OBJECTIVE:      TODAY'S TREATMENT: 04/18/22 Activity Comments                        TODAY'S TREATMENT: 04/15/22 Activity Comments  R DH 1-2 beats R upbeating torsional nystagmus followed by R downbeating torsional lasting ~30 sec   L DH Subtle L upbeating torional nystagmus lasting ~30 sec  L Epley  Tolerated well   L DH Subtle L upbeating torional nystagmus lasting ~20 sec  R/L brandt daroff 1x Good tolerance   Alt toe tap on cone; single tap 20x, double tap 10x Cues to shift onto standing LE  romberg EO/EC foam, wide stance  on foam 30" each R/L severe sway romberg EC, better in wide stance   standing on foam head turns/nods 30" each C/o more difficulty with head turns   fwd/back stepping + head nods to targets  C/o feeling wobbly; moderate imbalance        HOME EXERCISE PROGRAM Last updated: 04/15/22 Access Code: [email or web address] URL: [email or web address] Date: 04/15/2022 Prepared by: [facility]  Exercises - Brandt-Daroff Vestibular Exercise  - 1 x daily - 5 x weekly - 2 sets - 3-5 reps - Standing Toe Taps  - 1 x daily - 5 x weekly - 2 sets - 10 reps - Standing Balance with Eyes Closed on Foam  - 1 x daily - 5 x weekly - 2-3 sets - 30 sec hold - Standing with Head Rotation  - 1 x daily -  5 x weekly - 2-3 sets - 30 sec hold    Below measures were taken at time of initial evaluation unless otherwise specified:  DIAGNOSTIC FINDINGS: none recent   COGNITION: Overall cognitive status: Within functional limits for tasks assessed             SENSATION: WFL   GAIT: Gait pattern:  slight antalgia and unsteadiness Assistive device utilized: None   PATIENT SURVEYS:  FOTO [phone]  M-CTSIB  Condition 1: Firm Surface, EO 30 Sec, Normal Sway  Condition 2: Firm Surface, EC 30 Sec, Mild Sway  Condition 3: Foam Surface, EO 30 Sec, Mild and Moderate Sway  Condition 4: Foam Surface, EC 12 Sec, Severe Sway          VESTIBULAR ASSESSMENT              GENERAL OBSERVATION: wearing progressive lenses which she wears most of the time                         OCULOMOTOR EXAM:                       Ocular Alignment: L eye slightly elevated                       Ocular ROM: No Limitations                       Spontaneous Nystagmus: absent                       Gaze-Induced Nystagmus: absent                       Smooth Pursuits: intact                       Saccades: intact                       Convergence/Divergence: ~5 inches               VESTIBULAR -  OCULAR REFLEX:                        Slow VOR: Normal                       VOR Cancellation: Normal                       Head-Impulse Test: HIT Right: slightly positive HIT Left: positive *c/o dizziness                          POSITIONAL TESTING:  *c/o dizziness and indiscernable nystagmus evident in supine Right Roll Test: possible R upbeating torsional nystagmus and dizziness; Duration: 25 sec Left Roll Test: possible L upbeating torsional nystagmus and dizziness; Duration: 40 sec Right Dix-Hallpike: NT Left Dix-Hallpike: L upbeating torsional nystagmus; Duration: 20 sec   VESTIBULAR TREATMENT:   Canalith Repositioning:                       Epley Left: Number of Reps: 2, Response to Treatment: symptoms improved, and Comment: upon R head turn patient with ~5 sec of R upbeating torsional nystagmus followed by R downbeating torsional nystagmus  lasting ~20 sec   PATIENT EDUCATION: Education details: prognosis, POC, edu on exam results, anatomy and physiology of BPPV and vestibular hypofx Person educated: Patient Education method: Explanation Education comprehension: verbalized understanding and returned demonstration     GOALS: Goals reviewed with patient? Yes   SHORT TERM GOALS: Target date: 04/12/2022   Patient to be independent with initial HEP. Baseline: HEP initiated Goal status: IN PROGRESS       LONG TERM GOALS: Target date: 04/19/2022   Patient to be independent with advanced HEP. Baseline: Not yet initiated  Goal status: IN PROGRESS   Patient to report 0/10 dizziness with standing vertical and horizontal VOR at quick pae for 30 seconds. Baseline: see above Goal status: IN PROGRESS   Patient will report 0/10 dizziness with bed mobility.  Baseline: Symptomatic  Goal status: IN PROGRESS   Patient to demonstrate mild-moderate sway with M-CTSIB condition with eyes closed/foam surface in order to improve safety in environments with uneven surfaces and dim  lighting. Baseline: severe LOB Goal status: IN PROGRESS   Patient to score at least 20/24 on DGI in order to decrease risk of falls. Baseline: 19/24 Goal status: IN PROGRESS   Patient will ambulate over outdoor surfaces with LRAD while performing head turns to scan environment with good stability in order to indicate safe community mobility. Baseline: NT Goal status: IN PROGRESS       ASSESSMENT:   CLINICAL IMPRESSION: Patient arrived to session with report of resolution of spinning sensation. Positional testing revealed improved nystagmus with R DH, mild remaining nystagmus on L. Treated with L Epley which was tolerated well. Reviewed habituation to address remainder of symptoms and able to progress balance activities. Patient able to tolerate more challenging exercises with no c/o dizziness but with mild-moderate imbalance. Updated HEP with exercises that were safely tolerated today. Patient reported understanding of edu and no complaints at end of session.      OBJECTIVE IMPAIRMENTS Abnormal gait, decreased activity tolerance, decreased balance, and dizziness.    ACTIVITY LIMITATIONS carrying, lifting, bending, standing, squatting, sleeping, stairs, transfers, bed mobility, bathing, toileting, dressing, and reach over head   PARTICIPATION LIMITATIONS: meal prep, cleaning, laundry, driving, shopping, community activity, occupation, yard work, and church   PERSONAL FACTORS Age, Past/current experiences, Time since onset of injury/illness/exacerbation, and 3+ comorbidities: anxiety, bladder CA, breast CA with R lumpectomy, CAD, endometrial CA, HTN, osteoporosis  are also affecting patient's functional outcome.    REHAB POTENTIAL: Good   CLINICAL DECISION MAKING: Evolving/moderate complexity   EVALUATION COMPLEXITY: Moderate     PLAN: PT FREQUENCY: 1-2x/week   PT DURATION: 4 weeks   PLANNED INTERVENTIONS: Therapeutic exercises, Therapeutic activity, Neuromuscular re-education,  Balance training, Gait training, Patient/Family education, Self Care, Joint mobilization, Stair training, Vestibular training, Canalith repositioning, Dry Needling, Cryotherapy, Moist heat, Taping, Manual therapy, and Re-evaluation   PLAN FOR NEXT SESSION: progress VOR and habituation, balance    Janene Harvey, PT, DPT 04/17/22 10:13 AM  [facility] [address], [place], [place] [address] Phone # [phone] Fax # [phone]

## 2022-04-18 ENCOUNTER — Ambulatory Visit: Payer: HMO | Admitting: Physical Therapy

## 2022-04-18 ENCOUNTER — Encounter: Payer: Self-pay | Admitting: Physical Therapy

## 2022-04-18 DIAGNOSIS — R2681 Unsteadiness on feet: Secondary | ICD-10-CM

## 2022-04-18 DIAGNOSIS — H8113 Benign paroxysmal vertigo, bilateral: Secondary | ICD-10-CM | POA: Diagnosis not present

## 2022-04-18 DIAGNOSIS — R42 Dizziness and giddiness: Secondary | ICD-10-CM

## 2022-04-25 ENCOUNTER — Ambulatory Visit: Payer: HMO | Admitting: Physical Therapy

## 2022-05-02 ENCOUNTER — Encounter: Payer: PPO | Admitting: Physical Therapy

## 2022-05-09 ENCOUNTER — Encounter: Payer: PPO | Admitting: Physical Therapy

## 2022-05-15 ENCOUNTER — Encounter: Payer: PPO | Admitting: Physical Therapy

## 2022-05-20 DIAGNOSIS — M7732 Calcaneal spur, left foot: Secondary | ICD-10-CM | POA: Diagnosis not present

## 2022-05-20 DIAGNOSIS — M7662 Achilles tendinitis, left leg: Secondary | ICD-10-CM | POA: Diagnosis not present

## 2022-05-20 DIAGNOSIS — M79672 Pain in left foot: Secondary | ICD-10-CM | POA: Diagnosis not present

## 2022-05-20 DIAGNOSIS — I10 Essential (primary) hypertension: Secondary | ICD-10-CM | POA: Diagnosis not present

## 2022-05-27 DIAGNOSIS — C672 Malignant neoplasm of lateral wall of bladder: Secondary | ICD-10-CM | POA: Diagnosis not present

## 2022-06-05 ENCOUNTER — Other Ambulatory Visit: Payer: Self-pay | Admitting: *Deleted

## 2022-06-05 DIAGNOSIS — H10501 Unspecified blepharoconjunctivitis, right eye: Secondary | ICD-10-CM | POA: Diagnosis not present

## 2022-06-05 MED ORDER — ANASTROZOLE 1 MG PO TABS: 1.0000 mg | ORAL_TABLET | Freq: Every day | ORAL | 3 refills | Status: DC

## 2022-06-11 DIAGNOSIS — M7662 Achilles tendinitis, left leg: Secondary | ICD-10-CM | POA: Diagnosis not present

## 2022-06-11 DIAGNOSIS — M7732 Calcaneal spur, left foot: Secondary | ICD-10-CM | POA: Diagnosis not present

## 2022-06-17 NOTE — Progress Notes (Signed)
Patient Care Team: Harlan Stains, MD as PCP - General (Family Medicine) Coralie Keens, MD as Consulting Physician (General Surgery) Kyung Rudd, MD as Consulting Physician (Radiation Oncology) Yisroel Ramming, Everardo All, MD as Consulting Physician (Obstetrics and Gynecology) Remi Haggard, MD as Consulting Physician (Urology) Lafonda Mosses, MD as Consulting Physician (Gynecologic Oncology)  DIAGNOSIS: No diagnosis found.  SUMMARY OF ONCOLOGIC HISTORY: Oncology History  Endometrial cancer [facility])   Initial Diagnosis   Endometrial cancer ([place])   09/27/2019 Imaging   CT A/P: 1. Large hypoenhancing mass in the region of the lower uterus/upper cervix. This appears to expand the endometrial canal the lower uterine segment although the endometrial canal in the uterine fundus is nondilated. As such, endometrial canal/cervical stricture is not considered likely. Rather, imaging features are highly concerning for neoplasm, likely of endometrial origin. 2. No findings of metastatic disease. Specifically, no evidence for lymphadenopathy in the abdomen/pelvis. No peritoneal or omental nodularity. No ascites. 3. Left colonic diverticulosis without diverticulitis   10/09/2020 Imaging   MRI pelvis: Distended endocervical canal, as described above, favoring a lower cervical stenosis/stricture.   Mild associated polypoid soft tissue/debris in the endometrial cavity and along the lower cervix, poorly evaluated on unenhanced MR. While the endometrial soft tissue favors debris, consider hysteroscopy to exclude a cervical mass resulting in Stenosis/stricture.   10/17/2020 Initial Biopsy   EMB: A. ENDOMETRIUM MASS, RESECTION:  - Endometrioid adenocarcinoma.  - See comment.   B. ENDOMETRIUM, CURETTAGE:  - Blood and scanty benign endocervical mucosa.  - No endometrial tissue identified.   COMMENT:   A. The findings are consistent with FIGO grade 1/2.   11/09/2020 Imaging    Cystoscopy and biopsy with fulguration of papillary lesions (Dr. Milford Cage), Robotic-assisted laparoscopic total hysterectomy with bilateral salpingoophorectomy, bilateral pelvic LND, mini-lap for specimen removal  Findings: On EUA, cervix difficult to distinguish, flush with apex; uterus small and mobile. Blood and some tumor noted on D&C. On intra-abdominal entry, normal upper abdominal exam including liver edge, diaphragm, stomach, small and large bowel, omentum. Bilateral ovaries atrophic appearing. Uterus 6-8cm and normal appearing. Mapping unsuccessful to bilateral pelvic basins. Significant retroperitoneal fibrosis noted and significant adhesions between the bladder and cervix. Several mildly prominent pelvic lymph nodes, no frank tumor involvement. No intra-abdominal or pelvic disease noted at the end of surgery.   11/09/2020 Pathology Results   A. BLADDER TUMOR, BIOPSY:  - Low grade papillary urothelial carcinoma.  - No distinct lamina propria invasion identified.  - Muscularis propria is present and not involved.   B. CERVIX, RIGHT POSTERIOR, BIOPSY:  - Fibromuscular tissue devoid of epithelium, negative for carcinoma.   C. CERVIX, LEFT POSTERIOR, BIOPSY:  - Fibromuscular tissue with endocervical glandular type epithelium,  negative for carcinoma.   D. LYMPH NODES, RIGHT PELVIC, EXCISION:  - Five lymph nodes, negative for carcinoma (0/5).   E. LYMPH NODES, LEFT PELVIC, EXCISION:  - Four lymph nodes, negative for carcinoma (0/4).   F. UTERUS, CERVIX AND BILATERAL FALLOPIAN TUBES AND OVARIES, TOTAL  HYSTERECTOMY AND BILATERAL SALPINGO-OOPHORECTOMY:  - Endometrioid carcinoma, FIGO grade 2, with invasion less than half the  myometrium.  - No involvement of uterine serosa, cervical stroma or adnexa.  - See oncology table.   ONCOLOGY TABLE:   UTERUS, CARCINOMA OR CARCINOSARCOMA: Resection   Procedure: Total hysterectomy and bilateral salpingo-oophorectomy,  Excision of pelvic  lymph nodes  Histologic Type: Endometrioid carcinoma  Histologic Grade: FIGO grade 2  Myometrial Invasion:  Depth of Myometrial Invasion: 2 mm       Myometrial Thickness: 7 mm       Percentage of Myometrial Invasion: < 50%  Uterine Serosa Involvement: Not identified  Cervical Stroma Involvement: Not identified  Other Tissue/Organ Involvement: Not identified  Peritoneal/Ascitic Fluid: Not submitted/unknown  Lymphovascular Invasion: Not identified  Regional Lymph Nodes:       Pelvic Lymph Nodes Examined:            0 Sentinel            9 Non-sentinel            9 Total       Pelvic Lymph Nodes with Metastasis: 0        Para-aortic Lymph Nodes Examined:            0 Sentinel            0 Non-sentinel            0 Total  Distant Metastasis:       Distant Site(s) Involved: Not applicable  Pathologic Stage Classification (pTNM, AJCC 8th Edition): pT1a, pN0  Ancillary Studies: MMR/MSI testing will be ordered  Representative Tumor Block: F4  (v4.2.0.1)    12/24/2020 Genetic Testing   Negative hereditary cancer genetic testing: no pathogenic variants detected in [facility] Common Hereditary Cancers +RNA Panel.  The report date is December 24, 2020.  The Common Hereditary Cancers + RNA Panel offered by [facility] includes sequencing, deletion/duplication, and RNA testing of the following 47 genes: APC, ATM, AXIN2, BARD1, BMPR1A, BRCA1, BRCA2, BRIP1, CDH1, CDK4*, CDKN2A (p14ARF)*, CDKN2A (p16INK4a)*, CHEK2, CTNNA1, DICER1, EPCAM (Deletion/duplication testing only), GREM1 (promoter region deletion/duplication testing only), KIT, MEN1, MLH1, MSH2, MSH3, MSH6, MUTYH, NBN, NF1, NHTL1, PALB2, PDGFRA*, PMS2, POLD1, POLE, PTEN, RAD50, RAD51C, RAD51D, SDHB, SDHC, SDHD, SMAD4, SMARCA4. STK11, TP53, TSC1, TSC2, and VHL.  The following genes were evaluated for sequence changes only: SDHA and HOXB13 c.251G>A variant only.  RNA analysis is not performed for the * genes.     Ductal carcinoma in situ (DCIS) of  right breast  01/30/2021 Initial Diagnosis   Ductal carcinoma in situ (DCIS) of right breast   01/31/2021 Cancer Staging   Staging form: Breast, AJCC 8th Edition - Clinical stage from 01/31/2021: Stage 0 (cTis (DCIS), cN0, cM0, ER+, PR+) - Signed by Chauncey Cruel, MD on 01/31/2021 Stage prefix: Initial diagnosis Nuclear grade: G1   02/16/2021 Definitive Surgery   FINAL MICROSCOPIC DIAGNOSIS:   A. BREAST, RIGHT, LUMPECTOMY:  - Intermediate and low grade ductal carcinoma in situ with calcifications.  - Atypical lobular hyperplasia.  - Biopsy site.  - Fibrocystic change with calcifications.  - Complex sclerosing lesion.  - Intraductal papilloma.  - See oncology table.    03/29/2021 - 04/25/2021 Radiation Therapy   Site Technique Total Dose (Gy) Dose per Fx (Gy) Completed Fx Beam Energies  Breast, Right: Breast_Rt 3D 42.56/42.56 2.66 16/16 10X, 6XFFF  Breast, Right: Breast_Rt_Bst 3D 8/8 2 4/4 6X, 10X     06/02/2021 -  Anti-estrogen oral therapy   Anastrozole     CHIEF COMPLIANT: Ductal carcinoma in situ and elevated hemoglobin  INTERVAL HISTORY: Danielle Knapp is a 67 y.o. with the above mention Ductal carcinoma in situ. She presents to the clinic today for a follow-up.    ALLERGIES:  is allergic to ciprofloxacin hcl, erythromycin, metronidazole, tolmetin, ace inhibitors, lisinopril, eszopiclone, tape, and ciprofloxacin.  MEDICATIONS:  Current Outpatient Medications  Medication Sig Dispense Refill  amLODipine (NORVASC) 2.5 MG tablet Take 2.5 mg by mouth daily. (Patient not taking: Reported on 03/22/2022)  1   amLODipine (NORVASC) 5 MG tablet Take 5 mg by mouth daily.     amoxicillin-clavulanate (AUGMENTIN) 875-125 MG tablet Take 1 tablet by mouth 2 (two) times daily. (Patient not taking: Reported on 03/22/2022) 14 tablet 0   anastrozole (ARIMIDEX) 1 MG tablet Take 1 tablet (1 mg total) by mouth daily. 90 tablet 3   buPROPion (WELLBUTRIN XL) 300 MG 24 hr tablet Take 300 mg by mouth  daily.     Calcium Carb-Cholecalciferol (CALCIUM 500+D3 PO) Take 1 tablet by mouth every evening.     fluticasone (FLONASE) 50 MCG/ACT nasal spray Place 2 sprays into both nostrils daily.     loratadine (CLARITIN) 10 MG tablet Take 10 mg by mouth daily.     losartan (COZAAR) 100 MG tablet Take 100 mg by mouth daily.     Multiple Vitamin (MULTIVITAMIN WITH MINERALS) TABS tablet Take 1 tablet by mouth every evening.     sertraline (ZOLOFT) 100 MG tablet Take 200 mg by mouth daily.     valACYclovir (VALTREX) 1000 MG tablet Take 2 tablets (2,000 mg total) by mouth 2 (two) times daily as needed (for fever blisters). 30 tablet 1   zolpidem (AMBIEN) 10 MG tablet 1/2-1 tablet at bedtime as needed     No current facility-administered medications for this visit.    PHYSICAL EXAMINATION: ECOG PERFORMANCE STATUS: {CHL ONC ECOG PS:[phone]}  There were no vitals filed for this visit. There were no vitals filed for this visit.  BREAST:*** No palpable masses or nodules in either right or left breasts. No palpable axillary supraclavicular or infraclavicular adenopathy no breast tenderness or nipple discharge. (exam performed in the presence of a chaperone)  LABORATORY DATA:  I have reviewed the data as listed    Latest Ref Rng & Units 12/17/2021    8:11 AM 05/14/2021    2:14 PM 01/31/2021   12:10 PM  CMP  Glucose 70 - 99 mg/dL 122  95  100   BUN 8 - 23 mg/dL $Remove'15  13  13   'YWjcnjB$ Creatinine 0.44 - 1.00 mg/dL 0.96  0.92  0.78   Sodium 135 - 145 mmol/L 144  141  142   Potassium 3.5 - 5.1 mmol/L 4.6  4.6  4.3   Chloride 98 - 111 mmol/L 106  106  105   CO2 22 - 32 mmol/L $RemoveB'30  28  26   'hkLlWPMt$ Calcium 8.9 - 10.3 mg/dL 10.3  9.6  9.6   Total Protein 6.5 - 8.1 g/dL 7.2  7.0  7.1   Total Bilirubin 0.3 - 1.2 mg/dL 0.6  1.0  0.8   Alkaline Phos 38 - 126 U/L 65  68  69   AST 15 - 41 U/L $Remo'16  18  22   'eNrib$ ALT 0 - 44 U/L $Remo'18  20  26     'QhhFA$ Lab Results  Component Value Date   WBC 6.4 12/17/2021   HGB 17.2 (H) 12/17/2021   HCT  46.9 (H) 12/17/2021   MCV 88.0 12/17/2021   PLT 239 12/17/2021   NEUTROABS 4.5 12/17/2021    ASSESSMENT & PLAN:  No problem-specific Assessment & Plan notes found for this encounter.    No orders of the defined types were placed in this encounter.  The patient has a good understanding of the overall plan. she agrees with it. she will call with any problems that may  develop before the next visit here. Total time spent: 30 mins including face to face time and time spent for planning, charting and co-ordination of care   Suzzette Righter, [place] 06/17/22    I Gardiner Coins am scribing for Dr. Lindi Adie  ***

## 2022-06-18 ENCOUNTER — Other Ambulatory Visit: Payer: Self-pay

## 2022-06-18 ENCOUNTER — Inpatient Hospital Stay: Payer: HMO | Attending: Hematology and Oncology

## 2022-06-18 ENCOUNTER — Inpatient Hospital Stay (HOSPITAL_BASED_OUTPATIENT_CLINIC_OR_DEPARTMENT_OTHER): Payer: HMO | Admitting: Hematology and Oncology

## 2022-06-18 ENCOUNTER — Other Ambulatory Visit: Payer: Self-pay | Admitting: *Deleted

## 2022-06-18 VITALS — BP 158/79 | HR 67 | Temp 97.9°F | Resp 16 | Ht 65.5 in | Wt 183.6 lb

## 2022-06-18 DIAGNOSIS — C541 Malignant neoplasm of endometrium: Secondary | ICD-10-CM | POA: Insufficient documentation

## 2022-06-18 DIAGNOSIS — Z86000 Personal history of in-situ neoplasm of breast: Secondary | ICD-10-CM | POA: Diagnosis not present

## 2022-06-18 DIAGNOSIS — R232 Flushing: Secondary | ICD-10-CM | POA: Insufficient documentation

## 2022-06-18 DIAGNOSIS — C679 Malignant neoplasm of bladder, unspecified: Secondary | ICD-10-CM | POA: Diagnosis not present

## 2022-06-18 DIAGNOSIS — D0511 Intraductal carcinoma in situ of right breast: Secondary | ICD-10-CM

## 2022-06-18 DIAGNOSIS — N951 Menopausal and female climacteric states: Secondary | ICD-10-CM | POA: Diagnosis not present

## 2022-06-18 DIAGNOSIS — Z780 Asymptomatic menopausal state: Secondary | ICD-10-CM

## 2022-06-18 DIAGNOSIS — Z79811 Long term (current) use of aromatase inhibitors: Secondary | ICD-10-CM | POA: Insufficient documentation

## 2022-06-18 DIAGNOSIS — D751 Secondary polycythemia: Secondary | ICD-10-CM

## 2022-06-18 LAB — CBC WITH DIFFERENTIAL (CANCER CENTER ONLY)
Abs Immature Granulocytes: 0.02 10*3/uL (ref 0.00–0.07)
Basophils Absolute: 0.1 10*3/uL (ref 0.0–0.1)
Basophils Relative: 1 %
Eosinophils Absolute: 0.2 10*3/uL (ref 0.0–0.5)
Eosinophils Relative: 3 %
HCT: 45 % (ref 36.0–46.0)
Hemoglobin: 16.5 g/dL — ABNORMAL HIGH (ref 12.0–15.0)
Immature Granulocytes: 0 %
Lymphocytes Relative: 17 %
Lymphs Abs: 1 10*3/uL (ref 0.7–4.0)
MCH: 32.1 pg (ref 26.0–34.0)
MCHC: 36.7 g/dL — ABNORMAL HIGH (ref 30.0–36.0)
MCV: 87.5 fL (ref 80.0–100.0)
Monocytes Absolute: 0.5 10*3/uL (ref 0.1–1.0)
Monocytes Relative: 9 %
Neutro Abs: 4.2 10*3/uL (ref 1.7–7.7)
Neutrophils Relative %: 70 %
Platelet Count: 222 10*3/uL (ref 150–400)
RBC: 5.14 MIL/uL — ABNORMAL HIGH (ref 3.87–5.11)
RDW: 12.7 % (ref 11.5–15.5)
WBC Count: 5.9 10*3/uL (ref 4.0–10.5)
nRBC: 0 % (ref 0.0–0.2)

## 2022-06-18 LAB — CMP (CANCER CENTER ONLY)
ALT: 15 U/L (ref 0–44)
AST: 16 U/L (ref 15–41)
Albumin: 4.4 g/dL (ref 3.5–5.0)
Alkaline Phosphatase: 74 U/L (ref 38–126)
Anion gap: 5 (ref 5–15)
BUN: 15 mg/dL (ref 8–23)
CO2: 30 mmol/L (ref 22–32)
Calcium: 9.6 mg/dL (ref 8.9–10.3)
Chloride: 107 mmol/L (ref 98–111)
Creatinine: 0.82 mg/dL (ref 0.44–1.00)
GFR, Estimated: >60 mL/min (ref >60)
Glucose, Bld: 93 mg/dL (ref 70–99)
Potassium: 4.4 mmol/L (ref 3.5–5.1)
Sodium: 142 mmol/L (ref 135–145)
Total Bilirubin: 0.7 mg/dL (ref 0.3–1.2)
Total Protein: 6.9 g/dL (ref 6.5–8.1)

## 2022-06-18 NOTE — Progress Notes (Signed)
Error

## 2022-06-18 NOTE — Assessment & Plan Note (Signed)
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 Milford Cage 12/24/2020: Genetics: No deleterious mutations 01/23/2021: DCIS low-grade ER/PR positive 02/16/2021: Right lumpectomy: DCIS grade 1, negative margins, ER/PR positive 04/25/2021: Completed radiation 11/30/2020: Secondary polycythemia (negative for MPN panel)  Current treatment: Anastrozole started 06/02/2021 Anastrozole toxicities: 1.  Moderate hot flashes improved with taking anastrozole at bedtime  Breast cancer surveillance: 1.  Breast exam 06/18/2022: Benign 2. Mammogram 11/20/2021: Benign breast density category C  Return to clinic in 1 year for follow-up

## 2022-06-23 DIAGNOSIS — S99922A Unspecified injury of left foot, initial encounter: Secondary | ICD-10-CM | POA: Diagnosis not present

## 2022-06-23 DIAGNOSIS — S86002A Unspecified injury of left Achilles tendon, initial encounter: Secondary | ICD-10-CM | POA: Diagnosis not present

## 2022-06-25 DIAGNOSIS — M7662 Achilles tendinitis, left leg: Secondary | ICD-10-CM | POA: Diagnosis not present

## 2022-06-25 DIAGNOSIS — S93412A Sprain of calcaneofibular ligament of left ankle, initial encounter: Secondary | ICD-10-CM | POA: Diagnosis not present

## 2022-07-18 DIAGNOSIS — M7662 Achilles tendinitis, left leg: Secondary | ICD-10-CM | POA: Diagnosis not present

## 2022-07-18 DIAGNOSIS — S93412A Sprain of calcaneofibular ligament of left ankle, initial encounter: Secondary | ICD-10-CM | POA: Diagnosis not present

## 2022-07-29 ENCOUNTER — Telehealth: Payer: Self-pay | Admitting: *Deleted

## 2022-07-29 DIAGNOSIS — S93402D Sprain of unspecified ligament of left ankle, subsequent encounter: Secondary | ICD-10-CM | POA: Diagnosis not present

## 2022-07-29 DIAGNOSIS — F411 Generalized anxiety disorder: Secondary | ICD-10-CM | POA: Diagnosis not present

## 2022-07-29 DIAGNOSIS — S20219A Contusion of unspecified front wall of thorax, initial encounter: Secondary | ICD-10-CM | POA: Diagnosis not present

## 2022-07-29 DIAGNOSIS — F331 Major depressive disorder, recurrent, moderate: Secondary | ICD-10-CM | POA: Diagnosis not present

## 2022-07-29 DIAGNOSIS — I10 Essential (primary) hypertension: Secondary | ICD-10-CM | POA: Diagnosis not present

## 2022-07-29 DIAGNOSIS — W19XXXA Unspecified fall, initial encounter: Secondary | ICD-10-CM | POA: Diagnosis not present

## 2022-07-29 NOTE — Telephone Encounter (Signed)
Received call from pt with complaint of hair loss while on Anastrozole.  Per MD this is a common side effect and pt can try OTC collagen supplements to help with hair loss.  Pt educated and verbalized understanding.

## 2022-07-31 IMAGING — CT CT ABD-PELV W/ CM
3 of 5 series · 16 of 46 positions shown, 18 images · IV contrast (APPLIED)
Comparison: 09/26/2020

CLINICAL DATA: Epigastric pain, postop hysterectomy and removal of
bladder masses. Left lower quadrant swelling and pain.

EXAM:
CT ABDOMEN AND PELVIS WITH CONTRAST
TECHNIQUE: Multidetector CT imaging of the abdomen and pelvis was performed
using the standard protocol following bolus administration of
intravenous contrast.
CONTRAST:  100mL OMNIPAQUE IOHEXOL 300 MG/ML  SOLN

[Series 2: axial st · axial · 0.84mm/px · z∈[-584,-234]mm · 10 of 86 slices shown, 12 images]
[im 8/86  soft-tissue]
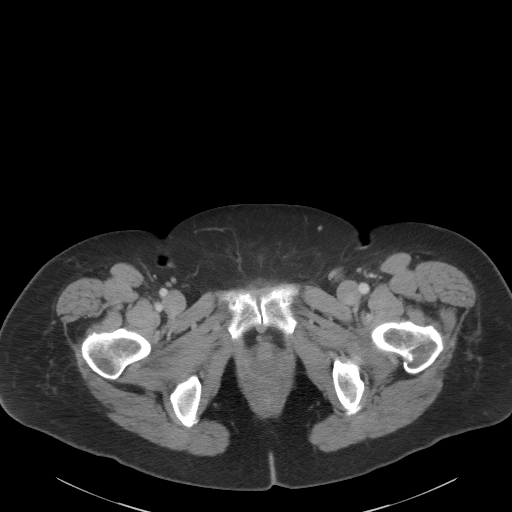
[im 8/86  bone]
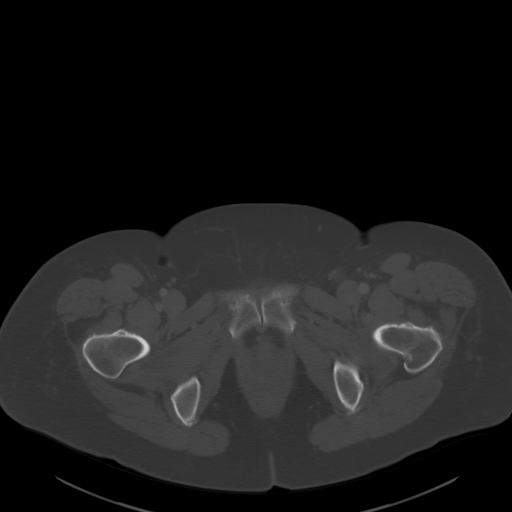
[im 16/86  soft-tissue]
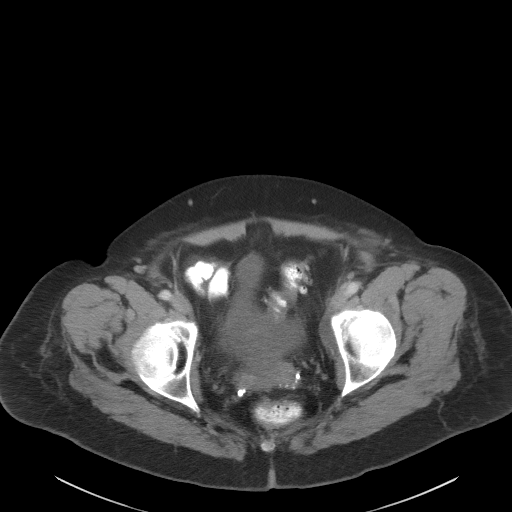
[im 24/86  soft-tissue]
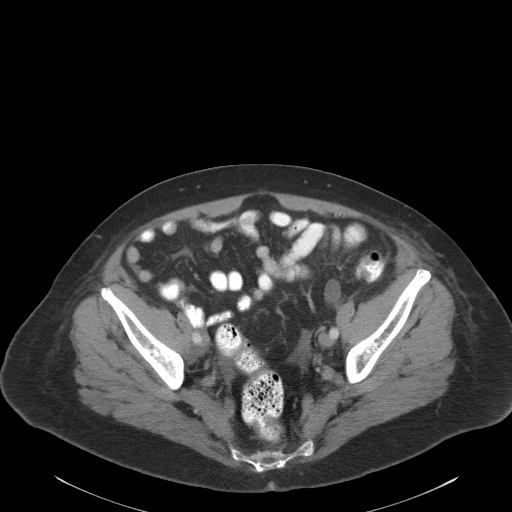
[im 31/86  soft-tissue]
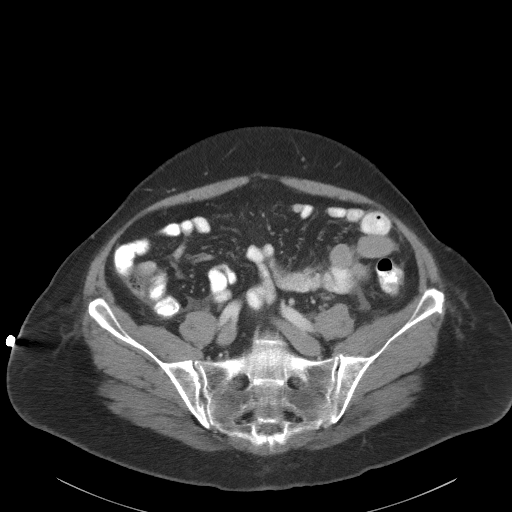
[im 39/86  soft-tissue]
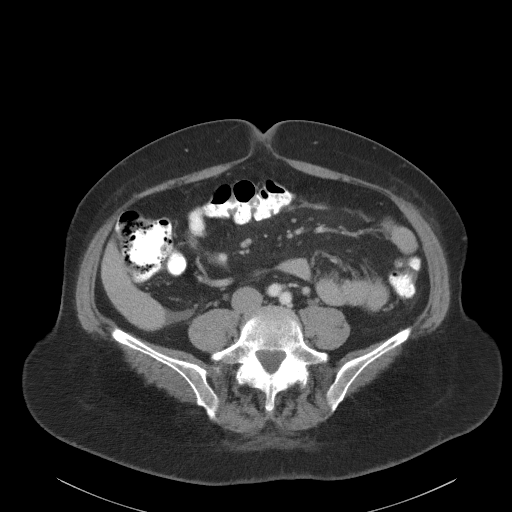
[im 47/86  soft-tissue]
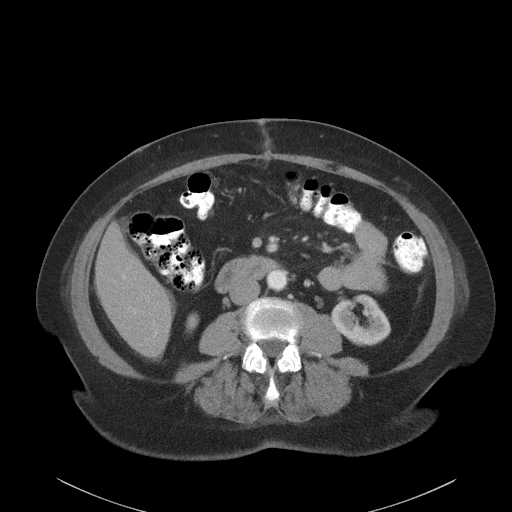
[im 55/86  soft-tissue]
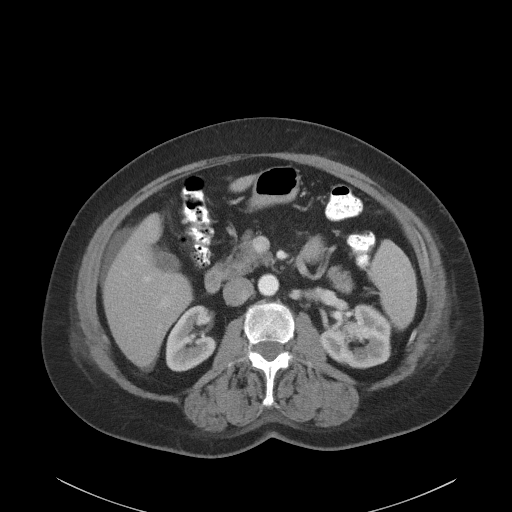
[im 62/86  soft-tissue]
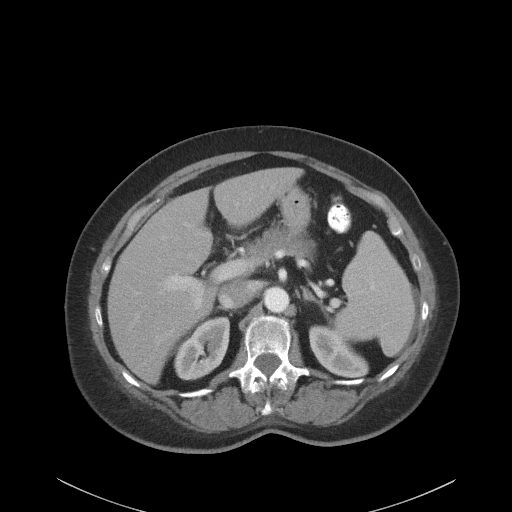
[im 70/86  soft-tissue]
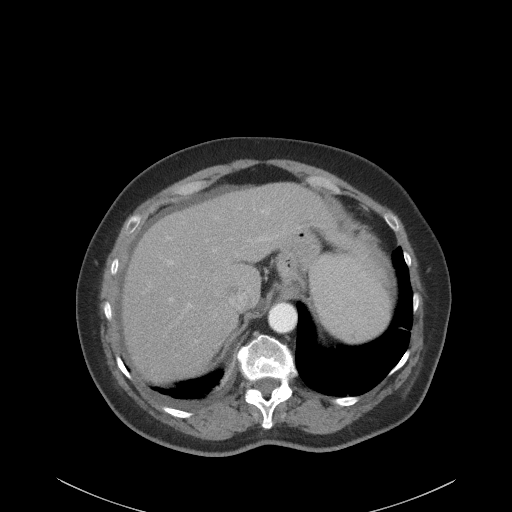
[im 70/86  bone]
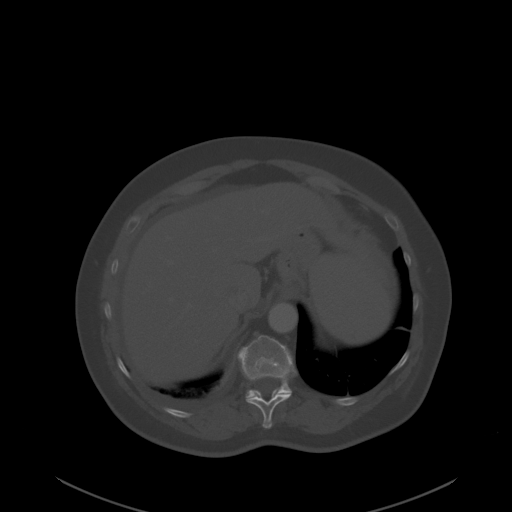
[im 78/86  soft-tissue]
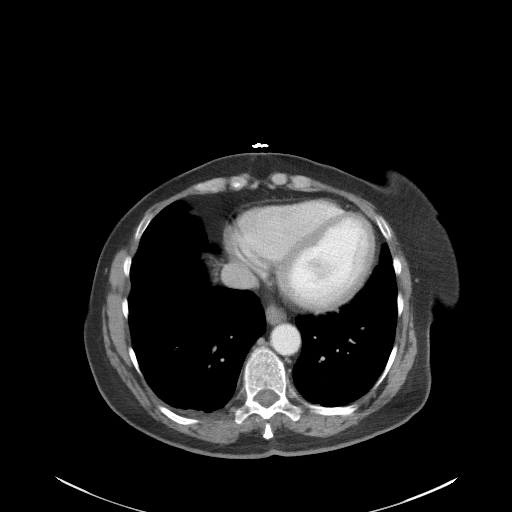

[Series 4: lung bases · axial · 0.84mm/px · z∈[-322,-290]mm · 3 of 72 slices shown]
[im 8/72  bone]
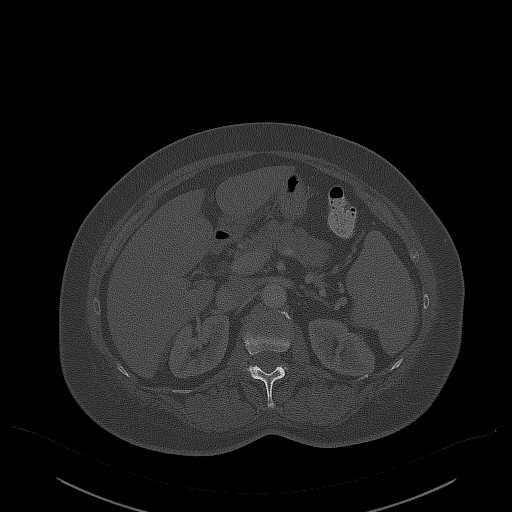
[im 16/72  bone]
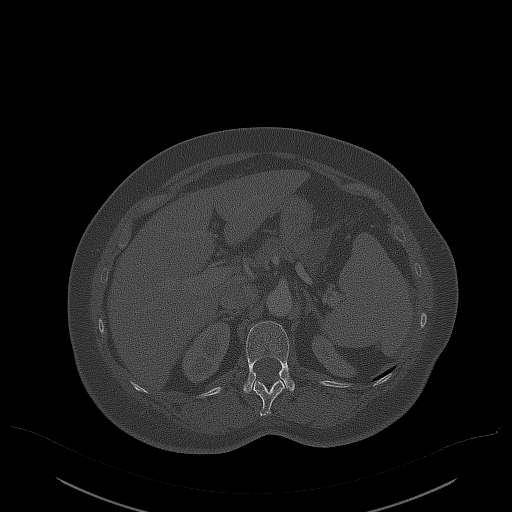
[im 24/72  bone]
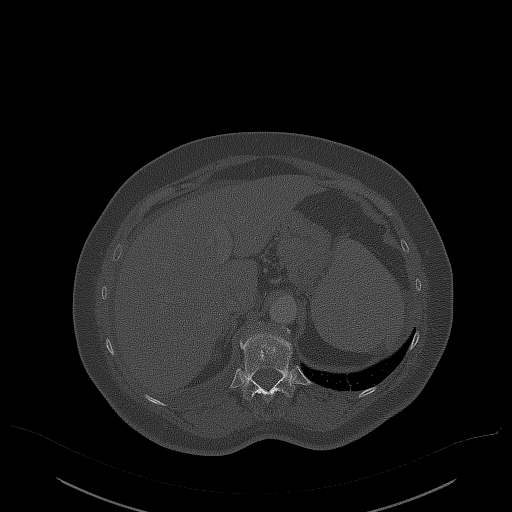

[Series 5: coronal st · coronal · 0.68mm/px · 3 of 101 slices shown]
[im 34/101  soft-tissue]
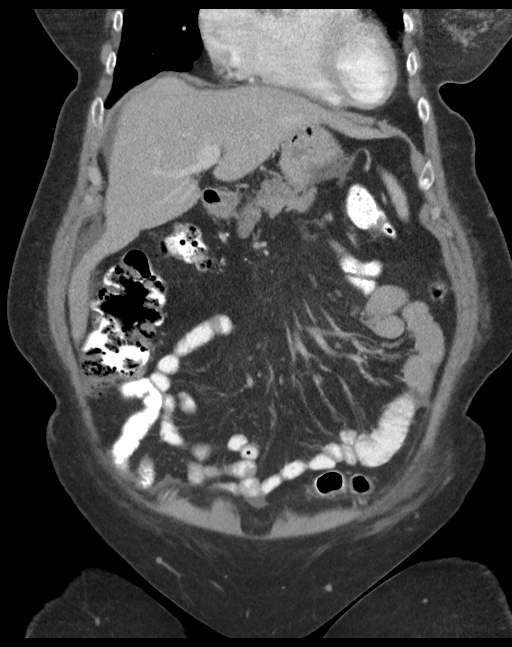
[im 45/101  soft-tissue]
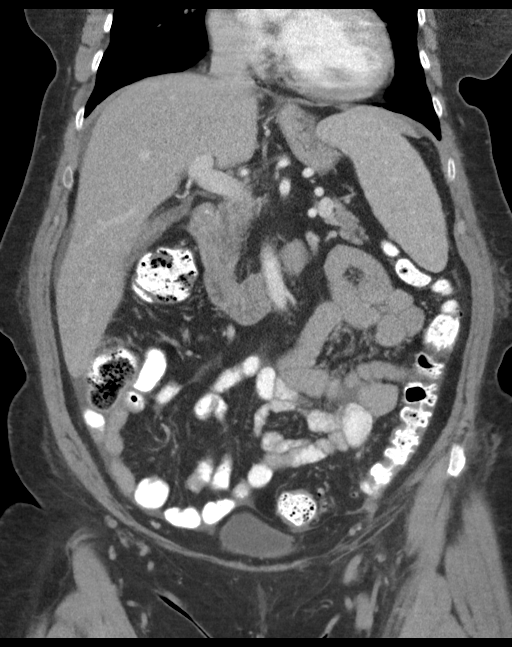
[im 56/101  soft-tissue]
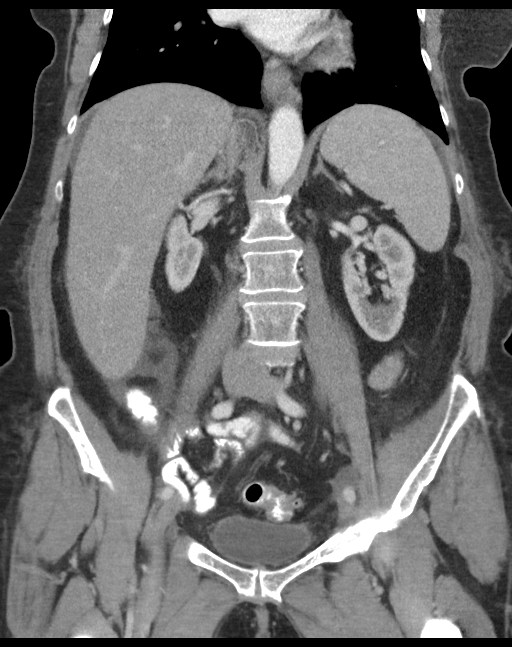

[16 of 46 positions shown; findings below may reference images not displayed]

FINDINGS: Lower chest: Heart is borderline in size. Small hiatal hernia. Trace
right pleural effusion with right base atelectasis.

Hepatobiliary: No focal hepatic abnormality. Gallbladder
unremarkable.

Pancreas: No focal abnormality or ductal dilatation.

Spleen: No focal abnormality.  Normal size.

Adrenals/Urinary Tract: Small nonobstructing stone in the upper pole
of the right kidney. No ureteral stones or hydronephrosis. No renal
or adrenal mass. Urinary bladder grossly unremarkable.

Stomach/Bowel: Sigmoid diverticulosis. No active diverticulitis.
Stomach and small bowel decompressed, unremarkable.

Vascular/Lymphatic: No evidence of aneurysm or adenopathy.

Reproductive: Changes of hysterectomy.  No adnexal mass.

Other: Small to moderate free fluid in the cul-de-sac, along the
left pelvic sidewall and adjacent to the liver and spleen. No free
air. No walled-off fluid collection to suggest abscess.

Musculoskeletal: No acute bony abnormality.
IMPRESSION: Changes of hysterectomy. Small to moderate free fluid within the
cul-de-sac, along the left pelvic sidewall, and adjacent to the
liver and spleen. No walled-off fluid collection to suggest abscess.

Sigmoid diverticulosis.

Trace right pleural effusion, right base atelectasis.

## 2022-08-06 NOTE — Progress Notes (Unsigned)
66 y.o. [place] Married Caucasian female here for annual exam.    Taking Anastrozole and is having hair loss.  Dr. Lindi Adie prescribing.   Seen by Dr. Berline Lopes.  Had a biopsy of vaginal lesion that was granulation tissue.  Seeing Dr. Milford Cage every 3 months for cystoscopy.  No bladder cancer recurrence.   Has a sleep study next month.   Has nasal HSV and takes Valtrex as needed. Takes periodically.   PCP:  Dr. Dema Severin  Patient's last menstrual period was 09/03/2011.           Sexually active: No.  The current method of family planning is post menopausal status.    Exercising: Yes.     Smoker:  no  Health Maintenance: Pap:  09/27/20 negative: negative HR HPV, 03/20/18 negative: negative HR HPV History of abnormal Pap:  yes, 2013 had colposcopy with Dr. Felicita Gage neg.  No treatment to cervix  MMG:  11/30/21, Breast Composition Category C, BI-RADS CATEGORY 2 Benign Colonoscopy:  02/02/18 BMD:   01/03/21  Result  osteopenia TDaP:  06/21/21 Gardasil:   no Screening Labs:  PCP and oncology. Had Covid, flu, and RSV vaccines.   reports that she has never smoked. She has never used smokeless tobacco. She reports current alcohol use of about 2.0 standard drinks of alcohol per week. She reports that she does not use drugs.  Past Medical History:  Diagnosis Date   Allergic rhinitis    Allergy    Anxiety    Bladder cancer ([place]) 10/17/2020   Borderline glaucoma, left    no eye drops   BPPV (benign paroxysmal positional vertigo)    Breast cancer ([place]) 01/23/2021   Cancer ([place]) 10/17/2020   Cataract    Chondromalacia of patella    left   Coronary artery disease    cardiac cath 11-03-2017 done for positive ETT,  showed very miminal nonobstrutive cad, ef 55-65%, non caridac chest pain   Diverticulosis of colon    Endometrial cancer ([place])    Family history of breast cancer 12/08/2020   Family history of ovarian cancer 12/08/2020   Family history of prostate cancer 12/08/2020    Frequency of urination    GERD (gastroesophageal reflux disease)    History of chest pain 10/14/2017   ED visit w/ admission,  hypertensive urgency;  follow up with dr Doylene Canard--- had outpatient ETT 10-21-2017 positive ischemia, echo 10-16-2017 G1DD, 60-65%;    s/p cardiac cath 11-03-2017 w/ very miminal nonobstructive cad , chest pain non cardiac   History of COVID-19 06/01/2021   History of diverticulitis    History of kidney stones    HSV-1 infection    fever blisters   Hx of adenomatous colonic polyps 02/08/2018   Hypertension    followed by pcp   MDD (major depressive disorder)    OA (osteoarthritis)    left knee, shoulder   Osteoporosis    Polycythemia    long hx followed by pcp and  sees hematology/ oncology--- dr Jerilynn Mages. Julien Nordmann on as needed basis, chronic and told to donate blood regularly   Stenosis of cervix    Uterine mass    Wears glasses     Past Surgical History:  Procedure Laterality Date   ABDOMINAL HYSTERECTOMY     BREAST LUMPECTOMY WITH RADIOACTIVE SEED LOCALIZATION Right 02/16/2021   Procedure: RIGHT BREAST LUMPECTOMY WITH RADIOACTIVE SEED LOCALIZATION;  Surgeon: Coralie Keens, MD;  Location: [facility];  Service: General;  Laterality: Right;   CATARACT EXTRACTION W/  INTRAOCULAR LENS  IMPLANT, BILATERAL  2013   COLONOSCOPY  02-02-2018 dr Carlean Purl   CYSTOSCOPY  10/17/2020   Procedure: CYSTOSCOPY;  Surgeon: Nunzio Cobbs, MD;  Location: [facility];  Service: Gynecology;;   CYSTOSCOPY N/A 11/09/2020   Procedure: Consuela Mimes;  Surgeon: Remi Haggard, MD;  Location: [facility];  Service: Urology;  Laterality: N/A;  Dr. Milford Cage needs to go first. [name] and rigid bx forceps.   DIAGNOSTIC LAPAROSCOPY  1996   DILATATION & CURETTAGE/HYSTEROSCOPY WITH MYOSURE N/A 10/17/2020   Procedure: DILATATION & CURETTAGE/HYSTEROSCOPY WITH  MYOSURE RESECTION OF ENDOMETRIAL MASS,  OPENING OF CERVICAL STENOSIS, DRAINAGE OF UTERINE FLUID;  Surgeon:  Nunzio Cobbs, MD;  Location: [facility];  Service: Gynecology;  Laterality: N/A;   EYE SURGERY     FRACTURE SURGERY     HEEL SPUR SURGERY Right 2015   Dr. Durward Fortes   HEMORRHOID SURGERY  2004   LEFT HEART CATH AND CORONARY ANGIOGRAPHY N/A 11/03/2017   Procedure: LEFT HEART CATH AND CORONARY ANGIOGRAPHY;  Surgeon: Jettie Booze, MD;  Location: [facility];  Service: Cardiovascular;  Laterality: N/A;   LYMPH NODE DISSECTION N/A 11/09/2020   Procedure: BILATERAL LYMPH NODE DISSECTION;MINI LAPAROTOMY;  Surgeon: Lafonda Mosses, MD;  Location: [facility];  Service: Gynecology;  Laterality: N/A;   OPERATIVE ULTRASOUND N/A 10/17/2020   Procedure: OPERATIVE ULTRASOUND;  Surgeon: Nunzio Cobbs, MD;  Location: [facility];  Service: Gynecology;  Laterality: N/A;   ROBOTIC ASSISTED TOTAL HYSTERECTOMY WITH BILATERAL SALPINGO OOPHERECTOMY Bilateral 11/09/2020   Procedure: XI ROBOTIC ASSISTED TOTAL HYSTERECTOMY WITH BILATERAL SALPINGO OOPHORECTOMY,;  Surgeon: Lafonda Mosses, MD;  Location: [facility];  Service: Gynecology;  Laterality: Bilateral;   SEPTOPLASTY  x2 1970's   TONSILLECTOMY  age 67   TRANSURETHRAL RESECTION OF BLADDER TUMOR N/A 11/09/2020   Procedure: TRANSURETHRAL RESECTION OF BLADDER TUMOR (TURBT);  Surgeon: Remi Haggard, MD;  Location: [facility];  Service: Urology;  Laterality: N/A;    Current Outpatient Medications  Medication Sig Dispense Refill   amLODipine (NORVASC) 5 MG tablet Take 5 mg by mouth daily.     anastrozole (ARIMIDEX) 1 MG tablet Take 1 tablet (1 mg total) by mouth daily. 90 tablet 3   buPROPion (WELLBUTRIN XL) 300 MG 24 hr tablet Take 300 mg by mouth daily.     Calcium Carb-Cholecalciferol (CALCIUM 500+D3 PO) Take 1 tablet by mouth every evening.     fluticasone (FLONASE) 50 MCG/ACT nasal spray Place 2 sprays into both nostrils daily.     loratadine (CLARITIN) 10 MG tablet Take 10 mg by mouth daily.      losartan (COZAAR) 100 MG tablet Take 100 mg by mouth daily.     Multiple Vitamin (MULTIVITAMIN WITH MINERALS) TABS tablet Take 1 tablet by mouth every evening.     sertraline (ZOLOFT) 100 MG tablet Take 200 mg by mouth daily.     valACYclovir (VALTREX) 1000 MG tablet Take 2 tablets (2,000 mg total) by mouth 2 (two) times daily as needed (for fever blisters). 30 tablet 1   zolpidem (AMBIEN) 10 MG tablet 1/2-1 tablet at bedtime as needed     amLODipine (NORVASC) 2.5 MG tablet Take 2.5 mg by mouth daily. (Patient not taking: Reported on 08/20/2022)  1   amoxicillin-clavulanate (AUGMENTIN) 875-125 MG tablet Take 1 tablet by mouth 2 (two) times daily. (Patient not taking: Reported on 08/20/2022) 14 tablet 0   tobramycin-dexamethasone (  TOBRADEX) ophthalmic solution Instill one drop OD QID x 5 days (Patient not taking: Reported on 08/20/2022)     No current facility-administered medications for this visit.    Family History  Problem Relation Age of Onset   Osteoporosis Mother    Heart attack Mother    Alzheimer's disease Mother    Arthritis Mother    Depression Mother    Diabetes Father    Hypertension Father    Heart failure Father    Alcohol abuse Father    COPD Father    Ovarian cancer Sister 16   Stroke Maternal Grandmother    Prostate cancer Maternal Grandfather 30       metastatic   Cancer Maternal Grandfather    Breast cancer Other        MGM's sister; dx after 30   Vision loss Paternal Grandmother    Colon cancer Neg Hx    Colon polyps Neg Hx    Esophageal cancer Neg Hx    Rectal cancer Neg Hx    Stomach cancer Neg Hx    Endometrial cancer Neg Hx    Pancreatic cancer Neg Hx     Review of Systems  See HPI.  Exam:   BP 126/80   Pulse 89   Ht 5' 5.5" (1.664 m)   Wt 180 lb (81.6 kg)   LMP 09/03/2011   SpO2 99%   BMI 29.50 kg/m     General appearance: alert, cooperative and appears stated age Head: normocephalic, without obvious abnormality, atraumatic Neck: no  adenopathy, supple, symmetrical, trachea midline and thyroid normal to inspection and palpation Lungs: clear to auscultation bilaterally Breasts: right breast - scar right lateral breast, no masses or tenderness, No nipple retraction or dimpling, No nipple discharge or bleeding, No axillary adenopathy Left breast - normal appearance, no masses or tenderness, No nipple retraction or dimpling, No nipple discharge or bleeding, No axillary adenopathy Heart: regular rate and rhythm Abdomen: soft, non-tender; no masses, no organomegaly Extremities: extremities normal, atraumatic, no cyanosis or edema Skin: skin color, texture, turgor normal. No rashes or lesions Lymph nodes: cervical, supraclavicular, and axillary nodes normal. Neurologic: grossly normal  Pelvic: External genitalia:  no lesions              No abnormal inguinal nodes palpated.              Urethra:  normal appearing urethra with no masses, tenderness or lesions              Bartholins and Skenes: normal                 Vagina: normal appearing vagina with normal color and discharge, no lesions              Cervix:  absent.  Atrophy noted.  Vein on the left vaginal apex.              Pap taken: yes Bimanual Exam:  Uterus:  absent              Adnexa: no mass, fullness, tenderness              Rectal exam: yes.  Confirms.              Anus:  normal sphincter tone, no lesions Chaperone was present for exam:  Raquel Sarna  Assessment:   Well woman visit with gynecologic exam. Status post robotic hysterectomy with BSO 11/09/20, stage IA, grade 2.  Doing visits every 6  months for 5 tears.  Status post vaginal cuff biopsy 05/09/21 - benign granulation tissue.  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  DCIS right breast. Status post lumpectomy and XRT. Negative genetic testing.  Osteopenia.  HSV I.   Plan: Mammogram screening discussed. Self breast awareness reviewed. Pap and HR HPV as above. Guidelines for [place], Vitamin  D, regular exercise program including cardiovascular and weight bearing exercise.   Follow up annually and prn.      After visit summary provided.   *** copy of note to Dr. Berline Lopes.  Pap colected.   Vein left apex of vaginal cuff.  Atrophy noted.

## 2022-08-20 ENCOUNTER — Encounter: Payer: Self-pay | Admitting: Obstetrics and Gynecology

## 2022-08-20 ENCOUNTER — Other Ambulatory Visit (HOSPITAL_COMMUNITY)
Admission: RE | Admit: 2022-08-20 | Discharge: 2022-08-20 | Disposition: A | Discharge status: Home / Self Care | Payer: PPO | Source: Ambulatory Visit | Attending: Obstetrics and Gynecology | Admitting: Obstetrics and Gynecology

## 2022-08-20 ENCOUNTER — Ambulatory Visit (INDEPENDENT_AMBULATORY_CARE_PROVIDER_SITE_OTHER): Payer: HMO | Admitting: Obstetrics and Gynecology

## 2022-08-20 VITALS — BP 126/80 | HR 89 | Ht 65.5 in | Wt 180.0 lb

## 2022-08-20 DIAGNOSIS — Z01419 Encounter for gynecological examination (general) (routine) without abnormal findings: Secondary | ICD-10-CM

## 2022-08-20 DIAGNOSIS — Z9189 Other specified personal risk factors, not elsewhere classified: Secondary | ICD-10-CM

## 2022-08-20 DIAGNOSIS — Z1272 Encounter for screening for malignant neoplasm of vagina: Secondary | ICD-10-CM | POA: Diagnosis not present

## 2022-08-20 DIAGNOSIS — Z8542 Personal history of malignant neoplasm of other parts of uterus: Secondary | ICD-10-CM | POA: Diagnosis not present

## 2022-08-20 DIAGNOSIS — Z1151 Encounter for screening for human papillomavirus (HPV): Secondary | ICD-10-CM | POA: Insufficient documentation

## 2022-08-20 DIAGNOSIS — D0511 Intraductal carcinoma in situ of right breast: Secondary | ICD-10-CM

## 2022-08-20 DIAGNOSIS — Z124 Encounter for screening for malignant neoplasm of cervix: Secondary | ICD-10-CM | POA: Diagnosis not present

## 2022-08-20 MED ORDER — VALACYCLOVIR HCL 1 G PO TABS: 2000.0000 mg | ORAL_TABLET | Freq: Two times a day (BID) | ORAL | 2 refills | Status: AC | PRN

## 2022-08-20 NOTE — Patient Instructions (Signed)

## 2022-08-21 ENCOUNTER — Telehealth: Payer: Self-pay | Admitting: *Deleted

## 2022-08-21 NOTE — Telephone Encounter (Signed)
Danielle Knapp called and scheduled a follow up appt with Dr Berline Lopes on 5/9 at 4 pm. Danielle Knapp saw Dr Quincy Simmonds yesterday

## 2022-08-22 DIAGNOSIS — C672 Malignant neoplasm of lateral wall of bladder: Secondary | ICD-10-CM | POA: Diagnosis not present

## 2022-08-30 LAB — CYTOLOGY - PAP
Comment: NEGATIVE
Diagnosis: NEGATIVE
High risk HPV: NEGATIVE

## 2022-09-16 DIAGNOSIS — C50911 Malignant neoplasm of unspecified site of right female breast: Secondary | ICD-10-CM | POA: Diagnosis not present

## 2022-09-20 ENCOUNTER — Ambulatory Visit (HOSPITAL_BASED_OUTPATIENT_CLINIC_OR_DEPARTMENT_OTHER): Payer: PPO | Attending: Hematology and Oncology | Admitting: Internal Medicine

## 2022-09-20 VITALS — Ht 67.0 in | Wt 183.0 lb

## 2022-09-20 DIAGNOSIS — G4733 Obstructive sleep apnea (adult) (pediatric): Secondary | ICD-10-CM

## 2022-09-20 DIAGNOSIS — D751 Secondary polycythemia: Secondary | ICD-10-CM | POA: Insufficient documentation

## 2022-09-20 DIAGNOSIS — R0681 Apnea, not elsewhere classified: Secondary | ICD-10-CM | POA: Insufficient documentation

## 2022-09-20 DIAGNOSIS — R0683 Snoring: Secondary | ICD-10-CM | POA: Diagnosis not present

## 2022-09-26 DIAGNOSIS — M7662 Achilles tendinitis, left leg: Secondary | ICD-10-CM | POA: Diagnosis not present

## 2022-09-28 DIAGNOSIS — D751 Secondary polycythemia: Secondary | ICD-10-CM | POA: Diagnosis not present

## 2022-09-28 NOTE — Procedures (Signed)
    Patient Name: Danielle Knapp, Danielle Knapp Date: 09/20/2022 Gender: Female D.O.B: [birth year] Age (years): 66 Referring Provider: Nicholas Lose Height (inches): 21 Interpreting Physician: Baird Lyons MD, ABSM Weight (lbs): 183 RPSGT: Baxter Flattery BMI: 29 MRN: [id number] Neck Size: 14.50  CLINICAL INFORMATION Sleep Study Type: NPSG Indication for sleep study: Snoring, Witnesses Apnea / Gasping During Sleep Epworth Sleepiness Score: 4  SLEEP STUDY TECHNIQUE As per the AASM Manual for the Scoring of Sleep and Associated Events v2.3 (April 2016) with a hypopnea requiring 4% desaturations.  The channels recorded and monitored were frontal, central and occipital EEG, electrooculogram (EOG), submentalis EMG (chin), nasal and oral airflow, thoracic and abdominal wall motion, anterior tibialis EMG, snore microphone, electrocardiogram, and pulse oximetry.  MEDICATIONS Medications self-administered by patient taken the night of the study : [place] The study was initiated at 11:00:13 PM and ended at 5:22:00 AM.  Sleep onset time was 1.1 minutes and the sleep efficiency was 90.0%. The total sleep time was 343.7 minutes.  Stage REM latency was 358.5 minutes.  The patient spent 2.7% of the night in stage N1 sleep, 91.1% in stage N2 sleep, 0.0% in stage N3 and 6.3% in REM.  Alpha intrusion was absent.  Supine sleep was 21.53%.  RESPIRATORY PARAMETERS The overall apnea/hypopnea index (AHI) was 11.7 per hour. There were 1 total apneas, including 1 obstructive, 0 central and 0 mixed apneas. There were 66 hypopneas and 0 RERAs.  The AHI during Stage REM sleep was 27.9 per hour.  AHI while supine was 43.8 per hour.  The mean oxygen saturation was 92.5%. The minimum SpO2 during sleep was 84.0%.  moderate snoring was noted during this study.  CARDIAC DATA The 2 lead EKG demonstrated sinus rhythm. The mean heart rate was 63.4 beats per minute. Other EKG findings include:  None.  LEG MOVEMENT DATA The total PLMS were 0 with a resulting PLMS index of 0.0. Associated arousal with leg movement index was 0.0 .  IMPRESSIONS - Mild obstructive sleep apnea occurred during this study (AHI = 11.7/h). - Mild oxygen desaturation was noted during this study (Min O2 = 84.0%, Mean 92.5%). Time with O2 saturation 88% or less was 6 minutes.  - The patient snored with moderate snoring volume. - No cardiac abnormalities were noted during this study. - Clinically significant periodic limb movements did not occur during sleep. No significant associated arousals.  DIAGNOSIS - Obstructive Sleep Apnea (G47.33)  RECOMMENDATIONS - Treatment for mild OSA is guided by symptoms and co-morbidity. Conservative measures may include observation, weight loss and sleep position off back. Consultation or other options including CPAP, a fitted oral appliance, or ENT evaluation would be based on clinical judgment. - Be careful with alcohol, sedatives and other CNS depressants that may worsen sleep apnea and disrupt normal sleep architecture. - Sleep hygiene should be reviewed to assess factors that may improve sleep quality. - Weight management and regular exercise should be initiated or continued if appropriate.  [Electronically signed] 09/28/2022 11:33 AM  Baird Lyons MD, ABSM Diplomate, American Board of Sleep Medicine NPI: [id number]                         [place], [place] [facility]  ELECTRONICALLY SIGNED ON:  09/28/2022, 11:34 AM [place] PH: [phone]   FX: [phone] [place]

## 2022-09-30 DIAGNOSIS — C50911 Malignant neoplasm of unspecified site of right female breast: Secondary | ICD-10-CM | POA: Diagnosis not present

## 2022-10-01 DIAGNOSIS — C50911 Malignant neoplasm of unspecified site of right female breast: Secondary | ICD-10-CM | POA: Diagnosis not present

## 2022-10-02 DIAGNOSIS — M7662 Achilles tendinitis, left leg: Secondary | ICD-10-CM | POA: Diagnosis not present

## 2022-10-04 DIAGNOSIS — M7662 Achilles tendinitis, left leg: Secondary | ICD-10-CM | POA: Diagnosis not present

## 2022-10-08 DIAGNOSIS — M7662 Achilles tendinitis, left leg: Secondary | ICD-10-CM | POA: Diagnosis not present

## 2022-10-10 DIAGNOSIS — M7662 Achilles tendinitis, left leg: Secondary | ICD-10-CM | POA: Diagnosis not present

## 2022-10-18 DIAGNOSIS — M7662 Achilles tendinitis, left leg: Secondary | ICD-10-CM | POA: Diagnosis not present

## 2022-10-21 DIAGNOSIS — D0511 Intraductal carcinoma in situ of right breast: Secondary | ICD-10-CM | POA: Diagnosis not present

## 2022-10-22 DIAGNOSIS — M7662 Achilles tendinitis, left leg: Secondary | ICD-10-CM | POA: Diagnosis not present

## 2022-10-24 DIAGNOSIS — M7662 Achilles tendinitis, left leg: Secondary | ICD-10-CM | POA: Diagnosis not present

## 2022-10-30 DIAGNOSIS — M7662 Achilles tendinitis, left leg: Secondary | ICD-10-CM | POA: Diagnosis not present

## 2022-11-05 DIAGNOSIS — J3089 Other allergic rhinitis: Secondary | ICD-10-CM | POA: Diagnosis not present

## 2022-11-05 DIAGNOSIS — F331 Major depressive disorder, recurrent, moderate: Secondary | ICD-10-CM | POA: Diagnosis not present

## 2022-11-05 DIAGNOSIS — C50919 Malignant neoplasm of unspecified site of unspecified female breast: Secondary | ICD-10-CM | POA: Diagnosis not present

## 2022-11-05 DIAGNOSIS — Z9181 History of falling: Secondary | ICD-10-CM | POA: Diagnosis not present

## 2022-11-05 DIAGNOSIS — F411 Generalized anxiety disorder: Secondary | ICD-10-CM | POA: Diagnosis not present

## 2022-11-05 DIAGNOSIS — K219 Gastro-esophageal reflux disease without esophagitis: Secondary | ICD-10-CM | POA: Diagnosis not present

## 2022-11-05 DIAGNOSIS — I10 Essential (primary) hypertension: Secondary | ICD-10-CM | POA: Diagnosis not present

## 2022-11-06 DIAGNOSIS — M7662 Achilles tendinitis, left leg: Secondary | ICD-10-CM | POA: Diagnosis not present

## 2022-11-08 DIAGNOSIS — M7662 Achilles tendinitis, left leg: Secondary | ICD-10-CM | POA: Diagnosis not present

## 2022-11-13 DIAGNOSIS — M7662 Achilles tendinitis, left leg: Secondary | ICD-10-CM | POA: Diagnosis not present

## 2022-11-18 ENCOUNTER — Encounter: Payer: Self-pay | Admitting: Hematology and Oncology

## 2022-11-19 DIAGNOSIS — M7662 Achilles tendinitis, left leg: Secondary | ICD-10-CM | POA: Diagnosis not present

## 2022-11-20 DIAGNOSIS — C672 Malignant neoplasm of lateral wall of bladder: Secondary | ICD-10-CM | POA: Diagnosis not present

## 2022-12-03 DIAGNOSIS — Z853 Personal history of malignant neoplasm of breast: Secondary | ICD-10-CM | POA: Diagnosis not present

## 2022-12-06 ENCOUNTER — Encounter: Payer: Self-pay | Admitting: Adult Health

## 2022-12-13 DIAGNOSIS — D0511 Intraductal carcinoma in situ of right breast: Secondary | ICD-10-CM | POA: Diagnosis not present

## 2022-12-24 DIAGNOSIS — H5201 Hypermetropia, right eye: Secondary | ICD-10-CM | POA: Diagnosis not present

## 2022-12-24 DIAGNOSIS — H52203 Unspecified astigmatism, bilateral: Secondary | ICD-10-CM | POA: Diagnosis not present

## 2022-12-24 DIAGNOSIS — H524 Presbyopia: Secondary | ICD-10-CM | POA: Diagnosis not present

## 2022-12-24 DIAGNOSIS — H5212 Myopia, left eye: Secondary | ICD-10-CM | POA: Diagnosis not present

## 2022-12-24 DIAGNOSIS — Z961 Presence of intraocular lens: Secondary | ICD-10-CM | POA: Diagnosis not present

## 2023-01-03 ENCOUNTER — Ambulatory Visit: Payer: PPO | Admitting: Internal Medicine

## 2023-01-07 DIAGNOSIS — Z79899 Other long term (current) drug therapy: Secondary | ICD-10-CM | POA: Diagnosis not present

## 2023-01-07 DIAGNOSIS — J3089 Other allergic rhinitis: Secondary | ICD-10-CM | POA: Diagnosis not present

## 2023-01-07 DIAGNOSIS — Z0000 Encounter for general adult medical examination without abnormal findings: Secondary | ICD-10-CM | POA: Diagnosis not present

## 2023-01-07 DIAGNOSIS — Z9181 History of falling: Secondary | ICD-10-CM | POA: Diagnosis not present

## 2023-01-07 DIAGNOSIS — F411 Generalized anxiety disorder: Secondary | ICD-10-CM | POA: Diagnosis not present

## 2023-01-07 DIAGNOSIS — I10 Essential (primary) hypertension: Secondary | ICD-10-CM | POA: Diagnosis not present

## 2023-01-07 DIAGNOSIS — E663 Overweight: Secondary | ICD-10-CM | POA: Diagnosis not present

## 2023-01-07 DIAGNOSIS — M8588 Other specified disorders of bone density and structure, other site: Secondary | ICD-10-CM | POA: Diagnosis not present

## 2023-01-07 DIAGNOSIS — F331 Major depressive disorder, recurrent, moderate: Secondary | ICD-10-CM | POA: Diagnosis not present

## 2023-01-07 DIAGNOSIS — Z833 Family history of diabetes mellitus: Secondary | ICD-10-CM | POA: Diagnosis not present

## 2023-01-07 DIAGNOSIS — K219 Gastro-esophageal reflux disease without esophagitis: Secondary | ICD-10-CM | POA: Diagnosis not present

## 2023-01-07 DIAGNOSIS — D45 Polycythemia vera: Secondary | ICD-10-CM | POA: Diagnosis not present

## 2023-01-07 DIAGNOSIS — C50911 Malignant neoplasm of unspecified site of right female breast: Secondary | ICD-10-CM | POA: Diagnosis not present

## 2023-01-07 DIAGNOSIS — M7662 Achilles tendinitis, left leg: Secondary | ICD-10-CM | POA: Diagnosis not present

## 2023-01-09 ENCOUNTER — Encounter: Payer: Self-pay | Admitting: Gynecologic Oncology

## 2023-01-09 ENCOUNTER — Other Ambulatory Visit: Payer: Self-pay

## 2023-01-09 ENCOUNTER — Inpatient Hospital Stay: Payer: PPO | Attending: Gynecologic Oncology | Admitting: Gynecologic Oncology

## 2023-01-09 VITALS — BP 139/75 | HR 78 | Temp 99.0°F | Ht 64.96 in | Wt 182.0 lb

## 2023-01-09 DIAGNOSIS — Z170 Estrogen receptor positive status [ER+]: Secondary | ICD-10-CM | POA: Diagnosis not present

## 2023-01-09 DIAGNOSIS — C541 Malignant neoplasm of endometrium: Secondary | ICD-10-CM

## 2023-01-09 DIAGNOSIS — Z90722 Acquired absence of ovaries, bilateral: Secondary | ICD-10-CM | POA: Insufficient documentation

## 2023-01-09 DIAGNOSIS — Z90710 Acquired absence of both cervix and uterus: Secondary | ICD-10-CM | POA: Insufficient documentation

## 2023-01-09 DIAGNOSIS — D0511 Intraductal carcinoma in situ of right breast: Secondary | ICD-10-CM | POA: Insufficient documentation

## 2023-01-09 DIAGNOSIS — Z8542 Personal history of malignant neoplasm of other parts of uterus: Secondary | ICD-10-CM | POA: Diagnosis not present

## 2023-01-09 NOTE — Progress Notes (Signed)
Gynecologic Oncology Return Clinic Visit  01/09/23  Reason for Visit: surveillance visit in the setting of early-stage low-risk endometrial cancer   Treatment History: Oncology History  Endometrial cancer [facility])   Initial Diagnosis   Endometrial cancer (HCC)   09/27/2019 Imaging   CT A/P: 1. Large hypoenhancing mass in the region of the lower uterus/upper cervix. This appears to expand the endometrial canal the lower uterine segment although the endometrial canal in the uterine fundus is nondilated. As such, endometrial canal/cervical stricture is not considered likely. Rather, imaging features are highly concerning for neoplasm, likely of endometrial origin. 2. No findings of metastatic disease. Specifically, no evidence for lymphadenopathy in the abdomen/pelvis. No peritoneal or omental nodularity. No ascites. 3. Left colonic diverticulosis without diverticulitis   10/09/2020 Imaging   MRI pelvis: Distended endocervical canal, as described above, favoring a lower cervical stenosis/stricture.   Mild associated polypoid soft tissue/debris in the endometrial cavity and along the lower cervix, poorly evaluated on unenhanced MR. While the endometrial soft tissue favors debris, consider hysteroscopy to exclude a cervical mass resulting in Stenosis/stricture.   10/17/2020 Initial Biopsy   EMB: A. ENDOMETRIUM MASS, RESECTION:  - Endometrioid adenocarcinoma.  - See comment.   B. ENDOMETRIUM, CURETTAGE:  - Blood and scanty benign endocervical mucosa.  - No endometrial tissue identified.   COMMENT:   A. The findings are consistent with FIGO grade 1/2.   11/09/2020 Imaging   Cystoscopy and biopsy with fulguration of papillary lesions (Dr. Benancio Deeds), Robotic-assisted laparoscopic total hysterectomy with bilateral salpingoophorectomy, bilateral pelvic LND, mini-lap for specimen removal  Findings: On EUA, cervix difficult to distinguish, flush with apex; uterus small and mobile.  Blood and some tumor noted on D&C. On intra-abdominal entry, normal upper abdominal exam including liver edge, diaphragm, stomach, small and large bowel, omentum. Bilateral ovaries atrophic appearing. Uterus 6-8cm and normal appearing. Mapping unsuccessful to bilateral pelvic basins. Significant retroperitoneal fibrosis noted and significant adhesions between the bladder and cervix. Several mildly prominent pelvic lymph nodes, no frank tumor involvement. No intra-abdominal or pelvic disease noted at the end of surgery.   11/09/2020 Pathology Results   A. BLADDER TUMOR, BIOPSY:  - Low grade papillary urothelial carcinoma.  - No distinct lamina propria invasion identified.  - Muscularis propria is present and not involved.   B. CERVIX, RIGHT POSTERIOR, BIOPSY:  - Fibromuscular tissue devoid of epithelium, negative for carcinoma.   C. CERVIX, LEFT POSTERIOR, BIOPSY:  - Fibromuscular tissue with endocervical glandular type epithelium,  negative for carcinoma.   D. LYMPH NODES, RIGHT PELVIC, EXCISION:  - Five lymph nodes, negative for carcinoma (0/5).   E. LYMPH NODES, LEFT PELVIC, EXCISION:  - Four lymph nodes, negative for carcinoma (0/4).   F. UTERUS, CERVIX AND BILATERAL FALLOPIAN TUBES AND OVARIES, TOTAL  HYSTERECTOMY AND BILATERAL SALPINGO-OOPHORECTOMY:  - Endometrioid carcinoma, FIGO grade 2, with invasion less than half the  myometrium.  - No involvement of uterine serosa, cervical stroma or adnexa.  - See oncology table.   ONCOLOGY TABLE:   UTERUS, CARCINOMA OR CARCINOSARCOMA: Resection   Procedure: Total hysterectomy and bilateral salpingo-oophorectomy,  Excision of pelvic lymph nodes  Histologic Type: Endometrioid carcinoma  Histologic Grade: FIGO grade 2  Myometrial Invasion:       Depth of Myometrial Invasion: 2 mm       Myometrial Thickness: 7 mm       Percentage of Myometrial Invasion: < 50%  Uterine Serosa Involvement: Not identified  Cervical Stroma Involvement:  Not identified  Other Tissue/Organ  Involvement: Not identified  Peritoneal/Ascitic Fluid: Not submitted/unknown  Lymphovascular Invasion: Not identified  Regional Lymph Nodes:       Pelvic Lymph Nodes Examined:            0 Sentinel            9 Non-sentinel            9 Total       Pelvic Lymph Nodes with Metastasis: 0        Para-aortic Lymph Nodes Examined:            0 Sentinel            0 Non-sentinel            0 Total  Distant Metastasis:       Distant Site(s) Involved: Not applicable  Pathologic Stage Classification (pTNM, AJCC 8th Edition): pT1a, pN0  Ancillary Studies: MMR/MSI testing will be ordered  Representative Tumor Block: F4  (v4.2.0.1)    12/24/2020 Genetic Testing   Negative hereditary cancer genetic testing: no pathogenic variants detected in [facility] Common Hereditary Cancers +RNA Panel.  The report date is December 24, 2020.  The Common Hereditary Cancers + RNA Panel offered by [facility] includes sequencing, deletion/duplication, and RNA testing of the following 47 genes: APC, ATM, AXIN2, BARD1, BMPR1A, BRCA1, BRCA2, BRIP1, CDH1, CDK4*, CDKN2A (p14ARF)*, CDKN2A (p16INK4a)*, CHEK2, CTNNA1, DICER1, EPCAM (Deletion/duplication testing only), GREM1 (promoter region deletion/duplication testing only), KIT, MEN1, MLH1, MSH2, MSH3, MSH6, MUTYH, NBN, NF1, NHTL1, PALB2, PDGFRA*, PMS2, POLD1, POLE, PTEN, RAD50, RAD51C, RAD51D, SDHB, SDHC, SDHD, SMAD4, SMARCA4. STK11, TP53, TSC1, TSC2, and VHL.  The following genes were evaluated for sequence changes only: SDHA and HOXB13 c.251G>A variant only.  RNA analysis is not performed for the * genes.     Ductal carcinoma in situ (DCIS) of right breast  01/30/2021 Initial Diagnosis   Ductal carcinoma in situ (DCIS) of right breast   01/31/2021 Cancer Staging   Staging form: Breast, AJCC 8th Edition - Clinical stage from 01/31/2021: Stage 0 (cTis (DCIS), cN0, cM0, ER+, PR+) - Signed by Lowella Dell, MD on 01/31/2021 Stage prefix: Initial  diagnosis Nuclear grade: G1   02/16/2021 Definitive Surgery   FINAL MICROSCOPIC DIAGNOSIS:   A. BREAST, RIGHT, LUMPECTOMY:  - Intermediate and low grade ductal carcinoma in situ with calcifications.  - Atypical lobular hyperplasia.  - Biopsy site.  - Fibrocystic change with calcifications.  - Complex sclerosing lesion.  - Intraductal papilloma.  - See oncology table.    03/29/2021 - 04/25/2021 Radiation Therapy   Site Technique Total Dose (Gy) Dose per Fx (Gy) Completed Fx Beam Energies  Breast, Right: Breast_Rt 3D 42.56/42.56 2.66 16/16 10X, 6XFFF  Breast, Right: Breast_Rt_Bst 3D 8/8 2 4/4 6X, 10X     06/02/2021 -  Anti-estrogen oral therapy   Anastrozole     Interval History: Doing well.  Denies any vaginal bleeding or discharge.  Reports baseline bowel bladder function.  Denies any abdominal or pelvic pain.  Past Medical/Surgical History: Past Medical History:  Diagnosis Date   Allergic rhinitis    Allergy    Anxiety    Bladder cancer (HCC) 10/17/2020   Borderline glaucoma, left    no eye drops   BPPV (benign paroxysmal positional vertigo)    Breast cancer (HCC) 01/23/2021   Cancer (HCC) 10/17/2020   Cataract    Chondromalacia of patella    left   Coronary artery disease    cardiac cath 11-03-2017 done for positive  ETT,  showed very miminal nonobstrutive cad, ef 55-65%, non caridac chest pain   Diverticulosis of colon    Endometrial cancer (HCC)    Family history of breast cancer 12/08/2020   Family history of ovarian cancer 12/08/2020   Family history of prostate cancer 12/08/2020   Frequency of urination    GERD (gastroesophageal reflux disease)    History of chest pain 10/14/2017   ED visit w/ admission,  hypertensive urgency;  follow up with dr Algie Coffer--- had outpatient ETT 10-21-2017 positive ischemia, echo 10-16-2017 G1DD, 60-65%;    s/p cardiac cath 11-03-2017 w/ very miminal nonobstructive cad , chest pain non cardiac   History of COVID-19 06/01/2021    History of diverticulitis    History of kidney stones    HSV-1 infection    fever blisters   Hx of adenomatous colonic polyps 02/08/2018   Hypertension    followed by pcp   MDD (major depressive disorder)    OA (osteoarthritis)    left knee, shoulder   Osteoporosis    Polycythemia    long hx followed by pcp and  sees hematology/ oncology--- dr Judie Petit. Arbutus Ped on as needed basis, chronic and told to donate blood regularly   Stenosis of cervix    Uterine mass    Wears glasses     Past Surgical History:  Procedure Laterality Date   ABDOMINAL HYSTERECTOMY     BREAST LUMPECTOMY WITH RADIOACTIVE SEED LOCALIZATION Right 02/16/2021   Procedure: RIGHT BREAST LUMPECTOMY WITH RADIOACTIVE SEED LOCALIZATION;  Surgeon: Abigail Miyamoto, MD;  Location: [facility];  Service: General;  Laterality: Right;   CATARACT EXTRACTION W/ INTRAOCULAR LENS  IMPLANT, BILATERAL  2013   COLONOSCOPY  02-02-2018 dr Leone Payor   CYSTOSCOPY  10/17/2020   Procedure: CYSTOSCOPY;  Surgeon: Patton Salles, MD;  Location: [facility];  Service: Gynecology;;   CYSTOSCOPY N/A 11/09/2020   Procedure: Bluford Kaufmann;  Surgeon: Belva Agee, MD;  Location: [facility];  Service: Urology;  Laterality: N/A;  Dr. Benancio Deeds needs to go first. [name] and rigid bx forceps.   DIAGNOSTIC LAPAROSCOPY  1996   DILATATION & CURETTAGE/HYSTEROSCOPY WITH MYOSURE N/A 10/17/2020   Procedure: DILATATION & CURETTAGE/HYSTEROSCOPY WITH  MYOSURE RESECTION OF ENDOMETRIAL MASS,  OPENING OF CERVICAL STENOSIS, DRAINAGE OF UTERINE FLUID;  Surgeon: Patton Salles, MD;  Location: [facility];  Service: Gynecology;  Laterality: N/A;   EYE SURGERY     FRACTURE SURGERY     HEEL SPUR SURGERY Right 2015   Dr. Cleophas Dunker   HEMORRHOID SURGERY  2004   LEFT HEART CATH AND CORONARY ANGIOGRAPHY N/A 11/03/2017   Procedure: LEFT HEART CATH AND CORONARY ANGIOGRAPHY;  Surgeon: Corky Crafts, MD;  Location: [facility]  INVASIVE CV LAB;  Service: Cardiovascular;  Laterality: N/A;   LYMPH NODE DISSECTION N/A 11/09/2020   Procedure: BILATERAL LYMPH NODE DISSECTION;MINI LAPAROTOMY;  Surgeon: Carver Fila, MD;  Location: [facility];  Service: Gynecology;  Laterality: N/A;   OPERATIVE ULTRASOUND N/A 10/17/2020   Procedure: OPERATIVE ULTRASOUND;  Surgeon: Patton Salles, MD;  Location[facility];  Service: Gynecology;  Laterality: N/A;   ROBOTIC ASSISTED TOTAL HYSTERECTOMY WITH BILATERAL SALPINGO OOPHERECTOMY Bilateral 11/09/2020   Procedure: XI ROBOTIC ASSISTED TOTAL HYSTERECTOMY WITH BILATERAL SALPINGO OOPHORECTOMY,;  Surgeon: Carver Fila, MD;  Location: [facility];  Service: Gynecology;  Laterality: Bilateral;   SEPTOPLASTY  x2 1970's   TONSILLECTOMY  age 18   TRANSURETHRAL  RESECTION OF BLADDER TUMOR N/A 11/09/2020   Procedure: TRANSURETHRAL RESECTION OF BLADDER TUMOR (TURBT);  Surgeon: Belva Agee, MD;  Location: [facility];  Service: Urology;  Laterality: N/A;    Family History  Problem Relation Age of Onset   Osteoporosis Mother    Heart attack Mother    Alzheimer's disease Mother    Arthritis Mother    Depression Mother    Diabetes Father    Hypertension Father    Heart failure Father    Alcohol abuse Father    COPD Father    Ovarian cancer Sister 88   Stroke Maternal Grandmother    Prostate cancer Maternal Grandfather 51       metastatic   Cancer Maternal Grandfather    Breast cancer Other        MGM's sister; dx after 42   Vision loss Paternal Grandmother    Colon cancer Neg Hx    Colon polyps Neg Hx    Esophageal cancer Neg Hx    Rectal cancer Neg Hx    Stomach cancer Neg Hx    Endometrial cancer Neg Hx    Pancreatic cancer Neg Hx     Social History   Socioeconomic History   Marital status: Married    Spouse name: Not on file   Number of children: Not on file   Years of education: Not on file   Highest education level: Not on file  Occupational  History   Not on file  Tobacco Use   Smoking status: Never   Smokeless tobacco: Never  Vaping Use   Vaping Use: Never used  Substance and Sexual Activity   Alcohol use: Yes    Alcohol/week: 2.0 standard drinks of alcohol    Types: 2 Glasses of wine per week    Comment: occasional wine   Drug use: Never   Sexual activity: Not Currently    Partners: Male    Birth control/protection: Post-menopausal, None  Other Topics Concern   Not on file  Social History Narrative   Not on file   Social Determinants of Health   Financial Resource Strain: Not on file  Food Insecurity: Not on file  Transportation Needs: Not on file  Physical Activity: Not on file  Stress: Not on file  Social Connections: Not on file    Current Medications:  Current Outpatient Medications:    amLODipine (NORVASC) 5 MG tablet, Take 5 mg by mouth daily., Disp: , Rfl:    anastrozole (ARIMIDEX) 1 MG tablet, Take 1 tablet (1 mg total) by mouth daily., Disp: 90 tablet, Rfl: 3   buPROPion (WELLBUTRIN XL) 300 MG 24 hr tablet, Take 300 mg by mouth daily., Disp: , Rfl:    Calcium Carb-Cholecalciferol (CALCIUM 500+D3 PO), Take 1 tablet by mouth every evening., Disp: , Rfl:    Cholecalciferol (VITAMIN D3) 1.25 MG (50000 UT) CAPS, Take by mouth., Disp: , Rfl:    fluticasone (FLONASE) 50 MCG/ACT nasal spray, Place 2 sprays into both nostrils daily., Disp: , Rfl:    loratadine (CLARITIN) 10 MG tablet, Take 10 mg by mouth daily., Disp: , Rfl:    losartan (COZAAR) 100 MG tablet, Take 100 mg by mouth daily., Disp: , Rfl:    Multiple Vitamin (MULTIVITAMIN WITH MINERALS) TABS tablet, Take 1 tablet by mouth every evening., Disp: , Rfl:    Multiple Vitamins-Minerals (HAIR SKIN AND NAILS FORMULA PO), Take by mouth. GUMMIES, Disp: , Rfl:    OVER THE COUNTER MEDICATION, Calogen+Vitamin C+Biotin 2  tabs BID, Disp: , Rfl:    sertraline (ZOLOFT) 100 MG tablet, Take 200 mg by mouth daily., Disp: , Rfl:    valACYclovir (VALTREX) 1000 MG  tablet, Take 2 tablets (2,000 mg total) by mouth 2 (two) times daily as needed (for fever blisters)., Disp: 30 tablet, Rfl: 2   zolpidem (AMBIEN) 10 MG tablet, 1/2-1 tablet at bedtime as needed, Disp: , Rfl:   Review of Systems: + anxiety, depression Denies appetite changes, fevers, chills, fatigue, unexplained weight changes. Denies hearing loss, neck lumps or masses, mouth sores, ringing in ears or voice changes. Denies cough or wheezing.  Denies shortness of breath. Denies chest pain or palpitations. Denies leg swelling. Denies abdominal distention, pain, blood in stools, constipation, diarrhea, nausea, vomiting, or early satiety. Denies pain with intercourse, dysuria, frequency, hematuria or incontinence. Denies hot flashes, pelvic pain, vaginal bleeding or vaginal discharge.   Denies joint pain, back pain or muscle pain/cramps. Denies itching, rash, or wounds. Denies dizziness, headaches, numbness or seizures. Denies swollen lymph nodes or glands, denies easy bruising or bleeding. Denies confusion, or decreased concentration.  Physical Exam: BP 139/75 (BP Location: Right Arm, Patient Position: Sitting)   Pulse 78   Temp 99 F (37.2 C) (Oral)   Ht 5' 4.96" (1.65 m)   Wt 182 lb (82.6 kg)   LMP 09/03/2011   SpO2 97%   BMI 30.32 kg/m  General: Alert, oriented, no acute distress. HEENT: Normocephalic, atraumatic, sclera anicteric. Chest: Unlabored breathing on room air. Abdomen: soft, nontender.  Normoactive bowel sounds.  No masses or hepatosplenomegaly appreciated.  Well-healed incisions. Extremities: Grossly normal range of motion.  Warm, well perfused.  No edema bilaterally. Skin: No rashes or lesions noted. Lymphatics: No cervical, supraclavicular, or inguinal adenopathy. GU: Normal appearing external genitalia without erythema, excoriation, or lesions.  Speculum exam reveals no bleeding or erythema. No masses or lesions.  Bimanual exam reveals cuff smooth, no masses.   Rectovaginal exam confirms these findings, no nodularity.  Laboratory & Radiologic Studies: 05/2021: A. VAGINAL CUFF BIOPSY:  - Fragment of granulation tissue  - No evidence of malignancy   Assessment & Plan: Danielle Knapp is a 67 y.o. woman with  Stage IA2 grade 2 endometrioid endometrial adenocarcinoma who presents for surveillance visit.  Vaginal cuff biopsy performed in September last year consistent with granulation tissue.   Doing well. NED on exam.    Per NCCN surveillance recommendations, we will continue with visits every 6 months for 5 years.  Given low risk disease, we will alternate these between my office and her OB/GYN.  She sees Dr. Edward Jolly at the end of the year.  I have asked her to call to schedule an appointment to see me after the new year.  We reviewed signs and symptoms that would be concerning for disease recurrence and the patient knows to call to see me if she develops any of these symptoms between scheduled visits.   Surveillance pap or HPV testing is no longer indicated.  20 minutes of total time was spent for this patient encounter, including preparation, face-to-face counseling with the patient and coordination of care, and documentation of the encounter.  Eugene Garnet, MD  Division of Gynecologic Oncology  Department of Obstetrics and Gynecology  [facility]

## 2023-01-09 NOTE — Patient Instructions (Addendum)
It was good to see you today.  I do not see or feel any evidence of cancer recurrence on your exam.  I will see you for follow-up in 12 months. Please call after the new year to see me in 01/2024.  Please see your OBGYN in 6 months.  As always, if you develop any new and concerning symptoms before your next visit, please call to see me sooner.

## 2023-01-10 ENCOUNTER — Ambulatory Visit: Payer: PPO | Admitting: Gynecologic Oncology

## 2023-01-13 NOTE — Progress Notes (Unsigned)
Visit canceled

## 2023-01-21 ENCOUNTER — Other Ambulatory Visit: Payer: Self-pay | Admitting: Podiatry

## 2023-01-21 DIAGNOSIS — M7662 Achilles tendinitis, left leg: Secondary | ICD-10-CM | POA: Diagnosis not present

## 2023-01-21 DIAGNOSIS — M7672 Peroneal tendinitis, left leg: Secondary | ICD-10-CM

## 2023-01-21 DIAGNOSIS — M7732 Calcaneal spur, left foot: Secondary | ICD-10-CM | POA: Diagnosis not present

## 2023-01-22 ENCOUNTER — Ambulatory Visit: Payer: PPO | Admitting: Internal Medicine

## 2023-01-22 ENCOUNTER — Encounter: Payer: Self-pay | Admitting: Internal Medicine

## 2023-01-22 VITALS — BP 120/78 | HR 64 | Ht 65.0 in | Wt 180.8 lb

## 2023-01-22 DIAGNOSIS — Z8551 Personal history of malignant neoplasm of bladder: Secondary | ICD-10-CM | POA: Diagnosis not present

## 2023-01-22 DIAGNOSIS — R1310 Dysphagia, unspecified: Secondary | ICD-10-CM

## 2023-01-22 DIAGNOSIS — R059 Cough, unspecified: Secondary | ICD-10-CM

## 2023-01-22 DIAGNOSIS — Z853 Personal history of malignant neoplasm of breast: Secondary | ICD-10-CM

## 2023-01-22 DIAGNOSIS — Z8542 Personal history of malignant neoplasm of other parts of uterus: Secondary | ICD-10-CM

## 2023-01-22 DIAGNOSIS — Z86010 Personal history of colonic polyps: Secondary | ICD-10-CM

## 2023-01-22 NOTE — Patient Instructions (Signed)
You have been scheduled for an endoscopy and colonoscopy. Please follow the written instructions given to you at your visit today. Please pick up your prep supplies at the pharmacy within the next 1-3 days. If you use inhalers (even only as needed), please bring them with you on the day of your procedure.   _______________________________________________________  If your blood pressure at your visit was 140/90 or greater, please contact your primary care physician to follow up on this.  _______________________________________________________  If you are age 48 or older, your body mass index should be between 23-30. Your Body mass index is 30.09 kg/m. If this is out of the aforementioned range listed, please consider follow up with your Primary Care Provider.  If you are age 24 or younger, your body mass index should be between 19-25. Your Body mass index is 30.09 kg/m. If this is out of the aformentioned range listed, please consider follow up with your Primary Care Provider.   ________________________________________________________  The [facility] providers would like to encourage you to use [facility] to communicate with providers for non-urgent requests or questions.  Due to long hold times on the telephone, sending your provider a message by [facility] may be a faster and more efficient way to get a response.  Please allow 48 business hours for a response.  Please remember that this is for non-urgent requests.  _______________________________________________________   I appreciate the opportunity to care for you. Stan Head, MD, [facility]

## 2023-01-22 NOTE — Progress Notes (Signed)
Danielle Knapp 67 y.o. [birth year] [id number]  Assessment & Plan:   Encounter Diagnoses  Name Primary?   Dysphagia, unspecified type Yes   Cough, unspecified type    Hx of adenomatous colonic polyps    History of endometrial cancer    History of bladder cancer    History of breast cancer    Evaluate cough and questionable dysphagia symptoms with EGD.  Surveillance colonoscopy for colon polyps.  In addition to her personal history she has a strong family history of GU cancers as well.  She has had genetic testing and it has been negative.  Factor this into future surveillance intervals.  The risks and benefits as well as alternatives of endoscopic procedure(s) have been discussed and reviewed. All questions answered. The patient agrees to proceed.   CC: Laurann Montana, MD   Subjective:   Chief Complaint: Cough, history of polyps  HPI 67 year old white woman, with personal history of breast, endometrial and bladder cancer and adenomatous colon polyps who has been having a several month history of intermittent cough usually triggered by eating solids more than liquids associated with some vomiting if she coughs very hard and long.  No prior upper respiratory or lower respiratory infection or pulmonary illness, she does have seasonal allergies and postnasal drip but she feels like that is under control with medication.  No classic heartburn symptoms.  And not currently on acid suppression.  She does think there may be some dysphagia with this when I question her more carefully.  It is not classic dysphagia but it does feel like food may lodged in there at times.  I do not think she has regurgitated a food bolus.     Colonoscopy 6 2019-2 adenomas max 8 mm plus diverticulosis and mixed hemorrhoids.  Allergies  Allergen Reactions   Ciprofloxacin Hcl Anaphylaxis and Rash   Erythromycin Diarrhea and Other (See Comments)   Metronidazole Anaphylaxis, Rash and Other (See Comments)    Tolmetin Other (See Comments)    Can tolerate Ibuprofen.  Can tolerate Ibuprofen.    Ace Inhibitors Other (See Comments)   Lisinopril Cough   Eszopiclone Other (See Comments)   Tape Other (See Comments)    Skin irritation   Ciprofloxacin Rash   Current Meds  Medication Sig   amLODipine (NORVASC) 5 MG tablet Take 5 mg by mouth daily.   anastrozole (ARIMIDEX) 1 MG tablet Take 1 tablet (1 mg total) by mouth daily.   buPROPion (WELLBUTRIN XL) 300 MG 24 hr tablet Take 300 mg by mouth daily.   Calcium Carb-Cholecalciferol (CALCIUM 500+D3 PO) Take 1 tablet by mouth every evening.   Cholecalciferol (VITAMIN D3) 1.25 MG (50000 UT) CAPS Take by mouth.   fluticasone (FLONASE) 50 MCG/ACT nasal spray Place 2 sprays into both nostrils daily.   loratadine (CLARITIN) 10 MG tablet Take 10 mg by mouth daily.   losartan (COZAAR) 100 MG tablet Take 100 mg by mouth daily.   Multiple Vitamin (MULTIVITAMIN WITH MINERALS) TABS tablet Take 1 tablet by mouth every evening.   Multiple Vitamins-Minerals (HAIR SKIN AND NAILS FORMULA PO) Take by mouth. GUMMIES   OVER THE COUNTER MEDICATION Calogen+Vitamin C+Biotin 2 tabs BID   sertraline (ZOLOFT) 100 MG tablet Take 200 mg by mouth daily.   valACYclovir (VALTREX) 1000 MG tablet Take 2 tablets (2,000 mg total) by mouth 2 (two) times daily as needed (for fever blisters).   zolpidem (AMBIEN) 10 MG tablet 1/2-1 tablet at bedtime as needed   Past Medical  History:  Diagnosis Date   Allergic rhinitis    Allergy    Anxiety    Bladder cancer (HCC) 10/17/2020   Borderline glaucoma, left    no eye drops   BPPV (benign paroxysmal positional vertigo)    Breast cancer (HCC) 01/23/2021   Cancer (HCC) 10/17/2020   Cataract    Chondromalacia of patella    left   Coronary artery disease    cardiac cath 11-03-2017 done for positive ETT,  showed very miminal nonobstrutive cad, ef 55-65%, non caridac chest pain   Diverticulosis of colon    Endometrial cancer (HCC)     Family history of breast cancer 12/08/2020   Family history of ovarian cancer 12/08/2020   Family history of prostate cancer 12/08/2020   Frequency of urination    GERD (gastroesophageal reflux disease)    History of chest pain 10/14/2017   ED visit w/ admission,  hypertensive urgency;  follow up with dr Algie Coffer--- had outpatient ETT 10-21-2017 positive ischemia, echo 10-16-2017 G1DD, 60-65%;    s/p cardiac cath 11-03-2017 w/ very miminal nonobstructive cad , chest pain non cardiac   History of COVID-19 06/01/2021   History of diverticulitis    History of kidney stones    HSV-1 infection    fever blisters   Hx of adenomatous colonic polyps 02/08/2018   Hypertension    followed by pcp   MDD (major depressive disorder)    OA (osteoarthritis)    left knee, shoulder   Osteoporosis    Polycythemia    long hx followed by pcp and  sees hematology/ oncology--- dr Judie Petit. [facility]us Ped on as needed basis, chronic and told to donate blood regularly   Stenosis of cervix    Uterine mass    Wears glasses    Past Surgical History:  Procedure Laterality Date   ABDOMINAL HYSTERECTOMY     BREAST LUMPECTOMY WITH RADIOACTIVE SEED LOCALIZATION Right 02/16/2021   Procedure: RIGHT BREAST LUMPECTOMY WITH RADIOACTIVE SEED LOCALIZATION;  Surgeon: Abigail Miyamoto, MD;  Location: [facility];  Service: General;  Laterality: Right;   CATARACT EXTRACTION W/ INTRAOCULAR LENS  IMPLANT, BILATERAL  2013   COLONOSCOPY  02-02-2018 dr Leone Payor   CYSTOSCOPY  10/17/2020   Procedure: CYSTOSCOPY;  Surgeon: Patton Salles, MD;  Location: [facility];  Service: Gynecology;;   CYSTOSCOPY N/A 11/09/2020   Procedure: Bluford Kaufmann;  Surgeon: Belva Agee, MD;  Location: [facility];  Service: Urology;  Laterality: N/A;  Dr. Benancio Deeds needs to go first. [name] and rigid bx forceps.   DIAGNOSTIC LAPAROSCOPY  1996   DILATATION & CURETTAGE/HYSTEROSCOPY WITH MYOSURE N/A 10/17/2020   Procedure: DILATATION &  CURETTAGE/HYSTEROSCOPY WITH  MYOSURE RESECTION OF ENDOMETRIAL MASS,  OPENING OF CERVICAL STENOSIS, DRAINAGE OF UTERINE FLUID;  Surgeon: Patton Salles, MD;  [facility];  Service: Gynecology;  Laterality: N/A;   EYE SURGERY     FRACTURE SURGERY     HEEL SPUR SURGERY Right 2015   Dr. Cleophas Dunker   HEMORRHOID SURGERY  2004   LEFT HEART CATH AND CORONARY ANGIOGRAPHY N/A 11/03/2017   Procedure: LEFT HEART CATH AND CORONARY ANGIOGRAPHY;  Surgeon: Corky Crafts, MD;  Location: [facility];  Service: Cardiovascular;  Laterality: N/A;   LYMPH NODE DISSECTION N/A 11/09/2020   Procedure: BILATERAL LYMPH NODE DISSECTION;MINI LAPAROTOMY;  Surgeon: Carver Fila, MD;  Location: [facility];  Service: Gynecology;  Laterality: N/A;   OPERATIVE ULTRASOUND N/A 10/17/2020  Procedure: OPERATIVE ULTRASOUND;  Surgeon: Patton Salles, MD;  Location: [facility];  Service: Gynecology;  Laterality: N/A;   ROBOTIC ASSISTED TOTAL HYSTERECTOMY WITH BILATERAL SALPINGO OOPHERECTOMY Bilateral 11/09/2020   Procedure: XI ROBOTIC ASSISTED TOTAL HYSTERECTOMY WITH BILATERAL SALPINGO OOPHORECTOMY,;  Surgeon: Carver Fila, MD;  Location: [facility];  Service: Gynecology;  Laterality: Bilateral;   SEPTOPLASTY  x2 1970's   TONSILLECTOMY  age 3   TRANSURETHRAL RESECTION OF BLADDER TUMOR N/A 11/09/2020   Procedure: TRANSURETHRAL RESECTION OF BLADDER TUMOR (TURBT);  Surgeon: Belva Agee, MD;  Location: [facility];  Service: Urology;  Laterality: N/A;   Social History   Social History Narrative   Patient is married she has stepchildren no biologic children   She is a Artist at [employer]   2-3 alcoholic beverages per week 1 caffeinated drink daily never smoker no drug use no other tobacco   family history includes Alcohol abuse in her father; Alzheimer's disease in her mother; Arthritis in her mother; Breast cancer in an other family member;  COPD in her father; Cancer in her maternal grandfather; Depression in her mother; Diabetes in her father; Heart attack in her mother; Heart failure in her father; Hypertension in her father; Osteoporosis in her mother; Ovarian cancer (age of onset: 17) in her sister; Prostate cancer (age of onset: 56) in her maternal grandfather; Stroke in her maternal grandmother; Vision loss in her paternal grandmother.   Review of Systems As per HPI also some anxious and depressed mood symptoms at times, insomnia, she has a left ankle tendinitis, arthritis pains and night sweats at times.  Otherwise negative.  Objective:   Physical Exam @BP  120/78   Pulse 64   Ht 5\' 5"  (1.651 m)   Wt 180 lb 12.8 oz (82 kg)   LMP 09/03/2011   BMI 30.09 kg/m @  General:  Well-developed, well-nourished and in no acute distress Eyes:  anicteric. ENT:   Mouth and posterior pharynx free of lesions.   slightly erythematous posterior pharynx mucosa..  Lungs: Clear to auscultation bilaterally. Heart:   S1S2, no rubs, murmurs, gallops. Abdomen:  soft, non-tender, no hepatosplenomegaly, hernia, or mass and BS+.  Rectal: Deferred until colonoscopy  Neuro:  A&O x 3.  Psych:  appropriate mood and  Affect.   Data Reviewed: Primary care summary of this year, gynecologic oncology notes from this year.  Hematology oncology note from October 2023

## 2023-01-24 DIAGNOSIS — C541 Malignant neoplasm of endometrium: Secondary | ICD-10-CM | POA: Diagnosis not present

## 2023-01-24 DIAGNOSIS — M8580 Other specified disorders of bone density and structure, unspecified site: Secondary | ICD-10-CM | POA: Diagnosis not present

## 2023-01-24 DIAGNOSIS — Z8262 Family history of osteoporosis: Secondary | ICD-10-CM | POA: Diagnosis not present

## 2023-01-24 DIAGNOSIS — Z90710 Acquired absence of both cervix and uterus: Secondary | ICD-10-CM | POA: Diagnosis not present

## 2023-01-24 DIAGNOSIS — Z923 Personal history of irradiation: Secondary | ICD-10-CM | POA: Diagnosis not present

## 2023-01-24 DIAGNOSIS — M859 Disorder of bone density and structure, unspecified: Secondary | ICD-10-CM | POA: Diagnosis not present

## 2023-01-24 DIAGNOSIS — C679 Malignant neoplasm of bladder, unspecified: Secondary | ICD-10-CM | POA: Diagnosis not present

## 2023-01-25 ENCOUNTER — Ambulatory Visit
Admission: RE | Admit: 2023-01-25 | Discharge: 2023-01-25 | Disposition: A | Discharge status: Home / Self Care | Payer: PPO | Source: Ambulatory Visit | Attending: Podiatry | Admitting: Podiatry

## 2023-01-25 DIAGNOSIS — M7672 Peroneal tendinitis, left leg: Secondary | ICD-10-CM

## 2023-01-25 DIAGNOSIS — M7662 Achilles tendinitis, left leg: Secondary | ICD-10-CM

## 2023-01-25 DIAGNOSIS — M25572 Pain in left ankle and joints of left foot: Secondary | ICD-10-CM | POA: Diagnosis not present

## 2023-01-25 DIAGNOSIS — M7732 Calcaneal spur, left foot: Secondary | ICD-10-CM

## 2023-01-29 ENCOUNTER — Encounter: Payer: Self-pay | Admitting: Hematology and Oncology

## 2023-02-01 ENCOUNTER — Other Ambulatory Visit: Payer: PPO

## 2023-02-01 DIAGNOSIS — M75100 Unspecified rotator cuff tear or rupture of unspecified shoulder, not specified as traumatic: Secondary | ICD-10-CM

## 2023-02-01 HISTORY — DX: Unspecified rotator cuff tear or rupture of unspecified shoulder, not specified as traumatic: M75.100

## 2023-02-09 DIAGNOSIS — M75101 Unspecified rotator cuff tear or rupture of right shoulder, not specified as traumatic: Secondary | ICD-10-CM | POA: Diagnosis not present

## 2023-02-10 DIAGNOSIS — S86312D Strain of muscle(s) and tendon(s) of peroneal muscle group at lower leg level, left leg, subsequent encounter: Secondary | ICD-10-CM | POA: Diagnosis not present

## 2023-02-10 DIAGNOSIS — M7662 Achilles tendinitis, left leg: Secondary | ICD-10-CM | POA: Diagnosis not present

## 2023-02-10 DIAGNOSIS — M7732 Calcaneal spur, left foot: Secondary | ICD-10-CM | POA: Diagnosis not present

## 2023-02-11 ENCOUNTER — Other Ambulatory Visit: Payer: Self-pay | Admitting: Podiatry

## 2023-02-11 ENCOUNTER — Encounter: Payer: Self-pay | Admitting: Internal Medicine

## 2023-02-18 DIAGNOSIS — M19011 Primary osteoarthritis, right shoulder: Secondary | ICD-10-CM | POA: Diagnosis not present

## 2023-02-18 DIAGNOSIS — M25811 Other specified joint disorders, right shoulder: Secondary | ICD-10-CM | POA: Diagnosis not present

## 2023-02-25 ENCOUNTER — Other Ambulatory Visit: Payer: Self-pay | Admitting: Orthopedic Surgery

## 2023-02-25 DIAGNOSIS — M25811 Other specified joint disorders, right shoulder: Secondary | ICD-10-CM

## 2023-02-25 DIAGNOSIS — M19011 Primary osteoarthritis, right shoulder: Secondary | ICD-10-CM

## 2023-02-27 ENCOUNTER — Encounter
Admission: RE | Admit: 2023-02-27 | Discharge: 2023-02-27 | Disposition: A | Discharge status: Home / Self Care | Payer: PPO | Source: Ambulatory Visit | Attending: Podiatry | Admitting: Podiatry

## 2023-02-27 ENCOUNTER — Ambulatory Visit
Admission: RE | Admit: 2023-02-27 | Discharge: 2023-02-27 | Disposition: A | Discharge status: Home / Self Care | Payer: PPO | Source: Ambulatory Visit | Attending: Orthopedic Surgery | Admitting: Orthopedic Surgery

## 2023-02-27 VITALS — Ht 65.0 in | Wt 180.8 lb

## 2023-02-27 DIAGNOSIS — I10 Essential (primary) hypertension: Secondary | ICD-10-CM

## 2023-02-27 DIAGNOSIS — Z01810 Encounter for preprocedural cardiovascular examination: Secondary | ICD-10-CM

## 2023-02-27 DIAGNOSIS — Z01812 Encounter for preprocedural laboratory examination: Secondary | ICD-10-CM

## 2023-02-27 DIAGNOSIS — M67813 Other specified disorders of tendon, right shoulder: Secondary | ICD-10-CM | POA: Diagnosis not present

## 2023-02-27 DIAGNOSIS — M25811 Other specified joint disorders, right shoulder: Secondary | ICD-10-CM

## 2023-02-27 DIAGNOSIS — M19011 Primary osteoarthritis, right shoulder: Secondary | ICD-10-CM

## 2023-02-27 HISTORY — DX: Sleep apnea, unspecified: G47.30

## 2023-02-27 NOTE — Patient Instructions (Addendum)
Your procedure is scheduled on:03-07-23 Friday Report to the Registration Desk on the 1st floor of the [facility].Then proceed to the 2nd floor Surgery Desk To find out your arrival time, please call [phone] between 1PM - 3PM on:03-05-23 Wednesday If your arrival time is 6:00 am, do not arrive before that time as the Medical Mall entrance doors do not open until 6:00 am.  REMEMBER: Instructions that are not followed completely may result in serious medical risk, up to and including death; or upon the discretion of your surgeon and anesthesiologist your surgery may need to be rescheduled.  Do not eat food after midnight the night before surgery.  No gum chewing or hard candies.  You may however, drink CLEAR liquids up to 2 hours before you are scheduled to arrive for your surgery. Do not drink anything within 2 hours of your scheduled arrival time.  Clear liquids include: - water  - apple juice without pulp - gatorade (not RED colors) - black coffee or tea (Do NOT add milk or creamers to the coffee or tea) Do NOT drink anything that is not on this list.  In addition, your doctor has ordered for you to drink the provided:  Ensure Pre-Surgery Clear Carbohydrate Drink  Drinking this carbohydrate drink up to two hours before surgery helps to reduce insulin resistance and improve patient outcomes. Please complete drinking 2 hours before scheduled arrival time.  One week prior to surgery: Stop Anti-inflammatories (NSAIDS) such as Advil, Aleve, Ibuprofen, Motrin, Naproxen, Naprosyn and Aspirin based products such as Excedrin, Goody's Powder, BC Powder.You may however, take Tylenol if needed for pain up until the day of surgery. Stop ANY OVER THE COUNTER supplements/vitamins NOW (02-27-23) until after surgery (Calcium, Vitamin D3, Multivitamin, Hair Skin and Nails, Collagen)   Continue taking all prescribed medications   TAKE ONLY THESE MEDICATIONS THE MORNING OF SURGERY WITH A SIP OF  WATER: -amLODipine (NORVASC)  -buPROPion (WELLBUTRIN XL)  -loratadine (CLARITIN)  -sertraline (ZOLOFT)   No Alcohol for 24 hours before or after surgery.  No Smoking including e-cigarettes for 24 hours before surgery.  No chewable tobacco products for at least 6 hours before surgery.  No nicotine patches on the day of surgery.  Do not use any "recreational" drugs for at least a week (preferably 2 weeks) before your surgery.  Please be advised that the combination of cocaine and anesthesia may have negative outcomes, up to and including death. If you test positive for cocaine, your surgery will be cancelled.  On the morning of surgery brush your teeth with toothpaste and water, you may rinse your mouth with mouthwash if you wish. Do not swallow any toothpaste or mouthwash.  Use CHG Soap as directed on instruction sheet.  Do not wear jewelry, make-up, hairpins, clips or nail polish.  Do not wear lotions, powders, or perfumes.   Do not shave body hair from the neck down 48 hours before surgery.  Contact lenses, hearing aids and dentures may not be worn into surgery.  Do not bring valuables to the hospital. [facility] is not responsible for any missing/lost belongings or valuables.   Notify your doctor if there is any change in your medical condition (cold, fever, infection).  Wear comfortable clothing (specific to your surgery type) to the hospital.  After surgery, you can help prevent lung complications by doing breathing exercises.  Take deep breaths and cough every 1-2 hours. Your doctor may order a device called an Incentive Spirometer to help you  take deep breaths. When coughing or sneezing, hold a pillow firmly against your incision with both hands. This is called "splinting." Doing this helps protect your incision. It also decreases belly discomfort.  If you are being admitted to the hospital overnight, leave your suitcase in the car. After surgery it may be brought to  your room.  In case of increased patient census, it may be necessary for you, the patient, to continue your postoperative care in the Same Day Surgery department.  If you are being discharged the day of surgery, you will not be allowed to drive home. You will need a responsible individual to drive you home and stay with you for 24 hours after surgery.   If you are taking public transportation, you will need to have a responsible individual with you.  Please call the Pre-admissions Testing Dept. at [phone] if you have any questions about these instructions.  Surgery Visitation Policy:  Patients having surgery or a procedure may have two visitors.  Children under the age of 70 must have an adult with them who is not the patient.     Preparing for Surgery with CHLORHEXIDINE GLUCONATE (CHG) Soap  Chlorhexidine Gluconate (CHG) Soap  o An antiseptic cleaner that kills germs and bonds with the skin to continue killing germs even after washing  o Used for showering the night before surgery and morning of surgery  Before surgery, you can play an important role by reducing the number of germs on your skin.  CHG (Chlorhexidine gluconate) soap is an antiseptic cleanser which kills germs and bonds with the skin to continue killing germs even after washing.  Please do not use if you have an allergy to CHG or antibacterial soaps. If your skin becomes reddened/irritated stop using the CHG.  1. Shower the NIGHT BEFORE SURGERY and the MORNING OF SURGERY with CHG soap.  2. If you choose to wash your hair, wash your hair first as usual with your normal shampoo.  3. After shampooing, rinse your hair and body thoroughly to remove the shampoo.  4. Use CHG as you would any other liquid soap. You can apply CHG directly to the skin and wash gently with a scrungie or a clean washcloth.  5. Apply the CHG soap to your body only from the neck down. Do not use on open wounds or open sores. Avoid  contact with your eyes, ears, mouth, and genitals (private parts). Wash face and genitals (private parts) with your normal soap.  6. Wash thoroughly, paying special attention to the area where your surgery will be performed.  7. Thoroughly rinse your body with warm water.  8. Do not shower/wash with your normal soap after using and rinsing off the CHG soap.  9. Pat yourself dry with a clean towel.  10. Wear clean pajamas to bed the night before surgery.  12. Place clean sheets on your bed the night of your first shower and do not sleep with pets.  13. Shower again with the CHG soap on the day of surgery prior to arriving at the hospital.  14. Do not apply any deodorants/lotions/powders.  15. Please wear clean clothes to the hospital.  How to Use an Incentive Spirometer An incentive spirometer is a tool that measures how well you are filling your lungs with each breath. Learning to take long, deep breaths using this tool can help you keep your lungs clear and active. This may help to reverse or lessen your chance of developing breathing (  pulmonary) problems, especially infection. You may be asked to use a spirometer: After a surgery. If you have a lung problem or a history of smoking. After a long period of time when you have been unable to move or be active. If the spirometer includes an indicator to show the highest number that you have reached, your health care provider or respiratory therapist will help you set a goal. Keep a log of your progress as told by your health care provider. What are the risks? Breathing too quickly may cause dizziness or cause you to pass out. Take your time so you do not get dizzy or light-headed. If you are in pain, you may need to take pain medicine before doing incentive spirometry. It is harder to take a deep breath if you are having pain. How to use your incentive spirometer  Sit up on the edge of your bed or on a chair. Hold the incentive  spirometer so that it is in an upright position. Before you use the spirometer, breathe out normally. Place the mouthpiece in your mouth. Make sure your lips are closed tightly around it. Breathe in slowly and as deeply as you can through your mouth, causing the piston or the ball to rise toward the top of the chamber. Hold your breath for 3-5 seconds, or for as long as possible. If the spirometer includes a coach indicator, use this to guide you in breathing. Slow down your breathing if the indicator goes above the marked areas. Remove the mouthpiece from your mouth and breathe out normally. The piston or ball will return to the bottom of the chamber. Rest for a few seconds, then repeat the steps 10 or more times. Take your time and take a few normal breaths between deep breaths so that you do not get dizzy or light-headed. Do this every 1-2 hours when you are awake. If the spirometer includes a goal marker to show the highest number you have reached (best effort), use this as a goal to work toward during each repetition. After each set of 10 deep breaths, cough a few times. This will help to make sure that your lungs are clear. If you have an incision on your chest or abdomen from surgery, place a pillow or a rolled-up towel firmly against the incision when you cough. This can help to reduce pain while taking deep breaths and coughing. General tips When you are able to get out of bed: Walk around often. Continue to take deep breaths and cough in order to clear your lungs. Keep using the incentive spirometer until your health care provider says it is okay to stop using it. If you have been in the hospital, you may be told to keep using the spirometer at home. Contact a health care provider if: You are having difficulty using the spirometer. You have trouble using the spirometer as often as instructed. Your pain medicine is not giving enough relief for you to use the spirometer as told. You  have a fever. Get help right away if: You develop shortness of breath. You develop a cough with bloody mucus from the lungs. You have fluid or blood coming from an incision site after you cough. Summary An incentive spirometer is a tool that can help you learn to take long, deep breaths to keep your lungs clear and active. You may be asked to use a spirometer after a surgery, if you have a lung problem or a history of smoking, or if you  have been inactive for a long period of time. Use your incentive spirometer as instructed every 1-2 hours while you are awake. If you have an incision on your chest or abdomen, place a pillow or a rolled-up towel firmly against your incision when you cough. This will help to reduce pain. Get help right away if you have shortness of breath, you cough up bloody mucus, or blood comes from your incision when you cough. This information is not intended to replace advice given to you by your health care provider. Make sure you discuss any questions you have with your health care provider. Document Revised: 11/08/2019 Document Reviewed: 11/08/2019 Elsevier Patient Education  2024 [employer].

## 2023-02-28 ENCOUNTER — Encounter: Payer: Self-pay | Admitting: Internal Medicine

## 2023-02-28 ENCOUNTER — Ambulatory Visit (AMBULATORY_SURGERY_CENTER): Payer: PPO | Admitting: Internal Medicine

## 2023-02-28 VITALS — BP 128/76 | HR 67 | Temp 98.6°F | Resp 10 | Ht 65.0 in | Wt 180.0 lb

## 2023-02-28 DIAGNOSIS — R059 Cough, unspecified: Secondary | ICD-10-CM | POA: Diagnosis not present

## 2023-02-28 DIAGNOSIS — I10 Essential (primary) hypertension: Secondary | ICD-10-CM | POA: Diagnosis not present

## 2023-02-28 DIAGNOSIS — Z86010 Personal history of colonic polyps: Secondary | ICD-10-CM | POA: Diagnosis not present

## 2023-02-28 DIAGNOSIS — Z09 Encounter for follow-up examination after completed treatment for conditions other than malignant neoplasm: Secondary | ICD-10-CM | POA: Diagnosis not present

## 2023-02-28 DIAGNOSIS — R1310 Dysphagia, unspecified: Secondary | ICD-10-CM

## 2023-02-28 MED ORDER — SODIUM CHLORIDE 0.9 % IV SOLN
500.0000 mL | INTRAVENOUS | Status: DC
Start: 2023-02-28 — End: 2023-02-28

## 2023-02-28 NOTE — Progress Notes (Signed)
Report to PACU, RN, vss, BBS= Clear.  

## 2023-02-28 NOTE — Progress Notes (Signed)
[facility] History and Physical   Primary Care Physician:  Laurann Montana, MD   Reason for Procedure:   Cough + dysphagia, hx colon polyps  Plan:    EGD, possible esophageal dilation and colonoscopy     HPI: Danielle Knapp is a 67 y.o. female w/ chronic cough sxs and possible dysphagia - also hx colon adenomas 2019  Seen 5/22 and these procedures were scheduled   Past Medical History:  Diagnosis Date   Allergic rhinitis    Allergy    Anxiety    Bladder cancer (HCC) 10/17/2020   Borderline glaucoma, left    no eye drops   BPPV (benign paroxysmal positional vertigo)    Breast cancer (HCC) 01/23/2021   Cancer (HCC) 10/17/2020   Cataract    Chondromalacia of patella    left   Coronary artery disease    cardiac cath 11-03-2017 done for positive ETT,  showed very miminal nonobstrutive cad, ef 55-65%, non caridac chest pain   Diverticulosis of colon    Endometrial cancer (HCC)    Family history of breast cancer 12/08/2020   Family history of ovarian cancer 12/08/2020   Family history of prostate cancer 12/08/2020   Frequency of urination    GERD (gastroesophageal reflux disease)    History of chest pain 10/14/2017   ED visit w/ admission,  hypertensive urgency;  follow up with dr Algie Coffer--- had outpatient ETT 10-21-2017 positive ischemia, echo 10-16-2017 G1DD, 60-65%;    s/p cardiac cath 11-03-2017 w/ very miminal nonobstructive cad , chest pain non cardiac   History of COVID-19 06/01/2021   History of diverticulitis    History of kidney stones    HSV-1 infection    fever blisters   Hx of adenomatous colonic polyps 02/08/2018   Hypertension    followed by pcp   MDD (major depressive disorder)    OA (osteoarthritis)    left knee, shoulder   Osteoporosis    Polycythemia    long hx followed by pcp and  sees hematology/ oncology--- dr Judie Petit. Arbutus Ped on as needed basis, chronic and told to donate blood regularly   Rotator cuff tear 02/2023   right   Sleep  apnea    pt states minimal sleep apnea that did not require cpap   Stenosis of cervix    Uterine mass    Wears glasses     Past Surgical History:  Procedure Laterality Date   ABDOMINAL HYSTERECTOMY     BREAST LUMPECTOMY WITH RADIOACTIVE SEED LOCALIZATION Right 02/16/2021   Procedure: RIGHT BREAST LUMPECTOMY WITH RADIOACTIVE SEED LOCALIZATION;  Surgeon: Abigail Miyamoto, MD;  Location: [facility];  Service: General;  Laterality: Right;   CATARACT EXTRACTION W/ INTRAOCULAR LENS  IMPLANT, BILATERAL  2013   COLONOSCOPY  02-02-2018 dr Leone Payor   CYSTOSCOPY  10/17/2020   Procedure: CYSTOSCOPY;  Surgeon: Patton Salles, MD;  [facility]  Service: Gynecology;;   CYSTOSCOPY N/A 11/09/2020   Procedure: Bluford Kaufmann;  Surgeon: Belva Agee, MD;  Location: [facility];  Service: Urology;  Laterality: N/A;  Dr. Benancio Deeds needs to go first. [name] and rigid bx forceps.   DIAGNOSTIC LAPAROSCOPY  1996   DILATATION & CURETTAGE/HYSTEROSCOPY WITH MYOSURE N/A 10/17/2020   Procedure: DILATATION & CURETTAGE/HYSTEROSCOPY WITH  MYOSURE RESECTION OF ENDOMETRIAL MASS,  OPENING OF CERVICAL STENOSIS, DRAINAGE OF UTERINE FLUID;  Surgeon: Patton Salles, MD;  Location: [facility];  Service: Gynecology;  Laterality: N/A;  EYE SURGERY     FRACTURE SURGERY     HEEL SPUR SURGERY Right 2015   Dr. Cleophas Dunker   HEMORRHOID SURGERY  2004   LEFT HEART CATH AND CORONARY ANGIOGRAPHY N/A 11/03/2017   Procedure: LEFT HEART CATH AND CORONARY ANGIOGRAPHY;  Surgeon: Corky Crafts, MD;  Location: [facility];  Service: Cardiovascular;  Laterality: N/A;   LYMPH NODE DISSECTION N/A 11/09/2020   Procedure: BILATERAL LYMPH NODE DISSECTION;MINI LAPAROTOMY;  Surgeon: Carver Fila, MD;  Location: [facility];  Service: Gynecology;  Laterality: N/A;   OPERATIVE ULTRASOUND N/A 10/17/2020   Procedure: OPERATIVE ULTRASOUND;  Surgeon: Patton Salles,  MD;  Location[facility];  Service: Gynecology;  Laterality: N/A;   ROBOTIC ASSISTED TOTAL HYSTERECTOMY WITH BILATERAL SALPINGO OOPHERECTOMY Bilateral 11/09/2020   Procedure: XI ROBOTIC ASSISTED TOTAL HYSTERECTOMY WITH BILATERAL SALPINGO OOPHORECTOMY,;  Surgeon: Carver Fila, MD;  Location: [facility];  Service: Gynecology;  Laterality: Bilateral;   SEPTOPLASTY  x2 1970's   TONSILLECTOMY  age 68   TRANSURETHRAL RESECTION OF BLADDER TUMOR N/A 11/09/2020   Procedure: TRANSURETHRAL RESECTION OF BLADDER TUMOR (TURBT);  Surgeon: Belva Agee, MD;  Location: [facility];  Service: Urology;  Laterality: N/A;    Prior to Admission medications   Medication Sig Start Date End Date Taking? Authorizing Provider  amLODipine (NORVASC) 5 MG tablet Take 5 mg by mouth every morning. 12/26/21  Yes [provider]  anastrozole (ARIMIDEX) 1 MG tablet Take 1 tablet (1 mg total) by mouth daily. Patient taking differently: Take 1 mg by mouth at bedtime. 06/05/22  Yes Serena Croissant, MD  buPROPion (WELLBUTRIN XL) 300 MG 24 hr tablet Take 300 mg by mouth every morning.   Yes [provider]  Calcium Carb-Cholecalciferol (CALCIUM 500+D3 PO) Take 1 tablet by mouth every evening.   Yes [provider]  Cholecalciferol (VITAMIN D3) 1.25 MG (50000 UT) CAPS Take 1 capsule by mouth daily.   Yes [provider]  COLLAGEN PO Take 2 tablets by mouth 2 (two) times daily.   Yes [provider]  fluticasone (FLONASE) 50 MCG/ACT nasal spray Place 2 sprays into both nostrils every morning. 10/13/19  Yes [provider]  loratadine (CLARITIN) 10 MG tablet Take 10 mg by mouth every morning.   Yes [provider]  losartan (COZAAR) 100 MG tablet Take 100 mg by mouth every morning.   Yes [provider]  Multiple Vitamin (MULTIVITAMIN WITH MINERALS) TABS tablet Take 1 tablet by mouth every evening.   Yes [provider]  Multiple  Vitamins-Minerals (HAIR SKIN AND NAILS FORMULA PO) Take 2 tablets by mouth daily at 6 (six) AM. GUMMIES   Yes [provider]  sertraline (ZOLOFT) 100 MG tablet Take 200 mg by mouth every morning. 07/22/12  Yes [provider]  zolpidem (AMBIEN) 10 MG tablet 5 mg at bedtime. 06/21/21  Yes [provider]  valACYclovir (VALTREX) 1000 MG tablet Take 2 tablets (2,000 mg total) by mouth 2 (two) times daily as needed (for fever blisters). 08/20/22   Patton Salles, MD    Current Outpatient Medications  Medication Sig Dispense Refill   amLODipine (NORVASC) 5 MG tablet Take 5 mg by mouth every morning.     anastrozole (ARIMIDEX) 1 MG tablet Take 1 tablet (1 mg total) by mouth daily. (Patient taking differently: Take 1 mg by mouth at bedtime.) 90 tablet 3   buPROPion (WELLBUTRIN XL) 300 MG 24 hr  tablet Take 300 mg by mouth every morning.     Calcium Carb-Cholecalciferol (CALCIUM 500+D3 PO) Take 1 tablet by mouth every evening.     Cholecalciferol (VITAMIN D3) 1.25 MG (50000 UT) CAPS Take 1 capsule by mouth daily.     COLLAGEN PO Take 2 tablets by mouth 2 (two) times daily.     fluticasone (FLONASE) 50 MCG/ACT nasal spray Place 2 sprays into both nostrils every morning.     loratadine (CLARITIN) 10 MG tablet Take 10 mg by mouth every morning.     losartan (COZAAR) 100 MG tablet Take 100 mg by mouth every morning.     Multiple Vitamin (MULTIVITAMIN WITH MINERALS) TABS tablet Take 1 tablet by mouth every evening.     Multiple Vitamins-Minerals (HAIR SKIN AND NAILS FORMULA PO) Take 2 tablets by mouth daily at 6 (six) AM. GUMMIES     sertraline (ZOLOFT) 100 MG tablet Take 200 mg by mouth every morning.     zolpidem (AMBIEN) 10 MG tablet 5 mg at bedtime.     valACYclovir (VALTREX) 1000 MG tablet Take 2 tablets (2,000 mg total) by mouth 2 (two) times daily as needed (for fever blisters). 30 tablet 2   Current Facility-Administered Medications  Medication Dose Route  Frequency Provider Last Rate Last Admin   0.9 %  sodium chloride infusion  500 mL Intravenous Continuous Iva Boop, MD        Allergies as of 02/28/2023 - Review Complete 02/28/2023  Allergen Reaction Noted   Ciprofloxacin hcl Anaphylaxis and Rash 12/29/2016   Erythromycin Diarrhea and Other (See Comments) 08/11/2012   Metronidazole Anaphylaxis, Rash, and Other (See Comments) 12/29/2016   Tolmetin Other (See Comments) 08/11/2012   Ace inhibitors Other (See Comments) 10/24/2017   Lisinopril Cough 10/24/2017   Eszopiclone Other (See Comments) 10/24/2017   Tape Other (See Comments) 08/17/2021    Family History  Problem Relation Age of Onset   Osteoporosis Mother    Heart attack Mother    Alzheimer's disease Mother    Arthritis Mother    Depression Mother    Diabetes Father    Hypertension Father    Heart failure Father    Alcohol abuse Father    COPD Father    Ovarian cancer Sister 50   Stroke Maternal Grandmother    Prostate cancer Maternal Grandfather 74       metastatic   Cancer Maternal Grandfather    Breast cancer Other        MGM's sister; dx after 28   Vision loss Paternal Grandmother    Colon cancer Neg Hx    Colon polyps Neg Hx    Esophageal cancer Neg Hx    Rectal cancer Neg Hx    Stomach cancer Neg Hx    Endometrial cancer Neg Hx    Pancreatic cancer Neg Hx     Social History   Socioeconomic History   Marital status: Married    Spouse name: Not on file   Number of children: Not on file   Years of education: Not on file   Highest education level: Not on file  Occupational History   Not on file  Tobacco Use   Smoking status: Never   Smokeless tobacco: Never  Vaping Use   Vaping Use: Never used  Substance and Sexual Activity   Alcohol use: Yes    Alcohol/week: 2.0 standard drinks of alcohol    Types: 2 Glasses of wine per week    Comment: occasional wine  Drug use: Never   Sexual activity: Not Currently    Partners: Male    Birth  control/protection: Post-menopausal, None  Other Topics Concern   Not on file  Social History Narrative   Patient is married she has stepchildren no biologic children   She is a Artist at [employer]   2-3 alcoholic beverages per week 1 caffeinated drink daily never smoker no drug use no other tobacco   Social Determinants of Corporate investment banker Strain: Not on file  Food Insecurity: Not on file  Transportation Needs: Not on file  Physical Activity: Not on file  Stress: Not on file  Social Connections: Not on file  Intimate Partner Violence: Not on file    Review of Systems:  All other review of systems negative except as mentioned in the HPI.  Physical Exam: Vital signs BP (!) 157/85   Pulse 62   Temp 98.6 F (37 C)   Ht 5\' 5"  (1.651 m)   Wt 180 lb (81.6 kg)   LMP 09/03/2011   SpO2 99%   BMI 29.95 kg/m   General:   Alert,  Well-developed, well-nourished, pleasant and cooperative in NAD Lungs:  Clear throughout to auscultation.   Heart:  Regular rate and rhythm; no murmurs, clicks, rubs,  or gallops. Abdomen:  Soft, nontender and nondistended. Normal bowel sounds.   Neuro/Psych:  Alert and cooperative. Normal mood and affect. A and O x 3   @Laruen Risser  Sena Slate, MD, [facility] [phone] (pager) 02/28/2023 1:27 PM@

## 2023-02-28 NOTE — Op Note (Addendum)
[facility] Patient Name: Danielle Knapp Procedure Date: 02/28/2023 1:33 PM MRN: [id number] Endoscopist: Iva Boop , MD, [id number] Age: 67 Referring MD:  Date of Birth: 1956-08-08 Gender: Female Account #: [id number] Procedure:                Upper GI endoscopy Indications:              Dysphagia, Chronic cough Medicines:                Monitored Anesthesia Care Procedure:                Pre-Anesthesia Assessment:                           - Prior to the procedure, a History and Physical                            was performed, and patient medications and                            allergies were reviewed. The patient's tolerance of                            previous anesthesia was also reviewed. The risks                            and benefits of the procedure and the sedation                            options and risks were discussed with the patient.                            All questions were answered, and informed consent                            was obtained. Prior Anticoagulants: The patient has                            taken no anticoagulant or antiplatelet agents. ASA                            Grade Assessment: II - A patient with mild systemic                            disease. After reviewing the risks and benefits,                            the patient was deemed in satisfactory condition to                            undergo the procedure.                           After obtaining informed consent, the endoscope was  passed under direct vision. Throughout the                            procedure, the patient's blood pressure, pulse, and                            oxygen saturations were monitored continuously. The                            Olympus Scope [id number] was introduced through the                            mouth, and advanced to the second part of duodenum.                            The upper GI endoscopy  was accomplished without                            difficulty. The patient tolerated the procedure                            well. Scope In: Scope Out: Findings:                 The esophagus was normal.                           The stomach was normal.                           The examined duodenum was normal.                           The cardia and gastric fundus were normal on                            retroflexion.                           The gastroesophageal flap valve was visualized                            endoscopically and classified as Hill Grade II                            (fold present, opens with respiration). Complications:            No immediate complications. Estimated Blood Loss:     Estimated blood loss: none. Impression:               - Normal esophagus.                           - Normal stomach.                           - Normal examined duodenum.                           -  Gastroesophageal flap valve classified as Hill                            Grade II (fold present, opens with respiration).                           - No specimens collected. No cause of cough or                            dysphgia seen. Recommendation:           - Patient has a contact number available for                            emergencies. The signs and symptoms of potential                            delayed complications were discussed with the                            patient. Return to normal activities tomorrow.                            Written discharge instructions were provided to the                            patient.                           - Resume previous diet.                           - Continue present medications. She says                            pantoprazole is helping so will continue that. Iva Boop, MD 02/28/2023 2:00:48 PM This report has been signed electronically.

## 2023-02-28 NOTE — Op Note (Signed)
[facility] Patient Name: Danielle Knapp Procedure Date: 02/28/2023 1:32 PM MRN: [id number] Endoscopist: Iva Boop , MD, [id number] Age: 67 Referring MD:  Date of Birth: 1955-12-06 Gender: Female Account #: [id number] Procedure:                Colonoscopy Indications:              Surveillance: Personal history of adenomatous                            polyps on last colonoscopy 5 years ago, Last                            colonoscopy: 2019 Medicines:                Monitored Anesthesia Care Procedure:                Pre-Anesthesia Assessment:                           - Prior to the procedure, a History and Physical                            was performed, and patient medications and                            allergies were reviewed. The patient's tolerance of                            previous anesthesia was also reviewed. The risks                            and benefits of the procedure and the sedation                            options and risks were discussed with the patient.                            All questions were answered, and informed consent                            was obtained. Prior Anticoagulants: The patient has                            taken no anticoagulant or antiplatelet agents. ASA                            Grade Assessment: II - A patient with mild systemic                            disease. After reviewing the risks and benefits,                            the patient was deemed in satisfactory condition to  undergo the procedure.                           After obtaining informed consent, the colonoscope                            was passed under direct vision. Throughout the                            procedure, the patient's blood pressure, pulse, and                            oxygen saturations were monitored continuously. The                            Olympus [id number] ([id number]) Colonoscope was                             introduced through the anus and advanced to the the                            cecum, identified by appendiceal orifice and                            ileocecal valve. The colonoscopy was performed                            without difficulty. The patient tolerated the                            procedure well. The quality of the bowel                            preparation was good. The bowel preparation used                            was Miralax via split dose instruction. The                            ileocecal valve, appendiceal orifice, and rectum                            were photographed. Scope In: 1:44:06 PM Scope Out: 1:52:48 PM Scope Withdrawal Time: 0 hours 6 minutes 30 seconds  Total Procedure Duration: 0 hours 8 minutes 42 seconds  Findings:                 The perianal and digital rectal examinations were                            normal.                           Multiple diverticula were found in the sigmoid  colon.                           The exam was otherwise without abnormality on                            direct and retroflexion views. Complications:            No immediate complications. Estimated Blood Loss:     Estimated blood loss: none. Impression:               - Diverticulosis in the sigmoid colon.                           - The examination was otherwise normal on direct                            and retroflexion views.                           - No specimens collected.                           - Personal history of colonic polyps. 2 adenomas                            2019 (subcm) Recommendation:           - Patient has a contact number available for                            emergencies. The signs and symptoms of potential                            delayed complications were discussed with the                            patient. Return to normal activities tomorrow.                             Written discharge instructions were provided to the                            patient.                           - Resume previous diet.                           - Continue present medications.                           - Repeat colonoscopy in 5 years for surveillance.                            Hx adenomas, + hx endometrial, bladder + breast ca  and FHx GU cancers (neg genetics, though) Iva Boop, MD 02/28/2023 2:04:29 PM This report has been signed electronically.

## 2023-02-28 NOTE — Progress Notes (Signed)
Pt's states no medical or surgical changes since previsit or office visit, with the exception of a right rotary cuff tear, which is noted in her chart.

## 2023-02-28 NOTE — Patient Instructions (Addendum)
I did not find any abnormalities on the upper endoscopy exam. Not sure what is driving your symptoms. It could be acid reflux causing things - stay on pantoprazole since that is helping..  Colonoscopy ok - no polyps. You do have diverticulosis - thickened muscle rings and pouches in the colon wall. Please read the handout about this condition.  Plan on repeat colonoscopy 2029.  I appreciate the opportunity to care for you. Iva Boop, MD, [facility]  Resume all of your regular medications today. Read your discharge instructions.  YOU HAD AN ENDOSCOPIC PROCEDURE TODAY AT THE [facility]:   Refer to the procedure report that was given to you for any specific questions about what was found during the examination.  If the procedure report does not answer your questions, please call your gastroenterologist to clarify.  If you requested that your care partner not be given the details of your procedure findings, then the procedure report has been included in a sealed envelope for you to review at your convenience later.  YOU SHOULD EXPECT: Some feelings of bloating in the abdomen. Passage of more gas than usual.  Walking can help get rid of the air that was put into your GI tract during the procedure and reduce the bloating. If you had a lower endoscopy (such as a colonoscopy or flexible sigmoidoscopy) you may notice spotting of blood in your stool or on the toilet paper. If you underwent a bowel prep for your procedure, you may not have a normal bowel movement for a few days.  Please Note:  You might notice some irritation and congestion in your nose or some drainage.  This is from the oxygen used during your procedure.  There is no need for concern and it should clear up in a day or so.  SYMPTOMS TO REPORT IMMEDIATELY:  Following lower endoscopy (colonoscopy or flexible sigmoidoscopy):  Excessive amounts of blood in the stool  Significant tenderness or worsening of abdominal  pains  Swelling of the abdomen that is new, acute  Fever of 100F or higher  Following upper endoscopy (EGD)  Vomiting of blood or coffee ground material  New chest pain or pain under the shoulder blades  Painful or persistently difficult swallowing  New shortness of breath  Fever of 100F or higher  Black, tarry-looking stools  For urgent or emergent issues, a gastroenterologist can be reached at any hour by calling [phone]. Do not use MyChart messaging for urgent concerns.    DIET:  We do recommend a small meal at first, but then you may proceed to your regular diet.  Drink plenty of fluids but you should avoid alcoholic beverages for 24 hours.  ACTIVITY:  You should plan to take it easy for the rest of today and you should NOT DRIVE or use heavy machinery until tomorrow (because of the sedation medicines used during the test).    FOLLOW UP: Our staff will call the number listed on your records the next business day following your procedure.  We will call around 7:15- 8:00 am to check on you and address any questions or concerns that you may have regarding the information given to you following your procedure. If we do not reach you, we will leave a message.     If any biopsies were taken you will be contacted by phone or by letter within the next 1-3 weeks.  Please call us at [phone] if you have not heard about the biopsies in  3 weeks.    SIGNATURES/CONFIDENTIALITY: You and/or your care partner have signed paperwork which will be entered into your electronic medical record.  These signatures attest to the fact that that the information above on your After Visit Summary has been reviewed and is understood.  Full responsibility of the confidentiality of this discharge information lies with you and/or your care-partner.

## 2023-03-03 ENCOUNTER — Encounter
Admission: RE | Admit: 2023-03-03 | Discharge: 2023-03-03 | Disposition: A | Discharge status: Home / Self Care | Payer: PPO | Source: Ambulatory Visit | Attending: Podiatry | Admitting: Podiatry

## 2023-03-03 ENCOUNTER — Telehealth: Payer: Self-pay | Admitting: *Deleted

## 2023-03-03 DIAGNOSIS — Z01810 Encounter for preprocedural cardiovascular examination: Secondary | ICD-10-CM

## 2023-03-03 DIAGNOSIS — Z01818 Encounter for other preprocedural examination: Secondary | ICD-10-CM | POA: Insufficient documentation

## 2023-03-03 DIAGNOSIS — Z01812 Encounter for preprocedural laboratory examination: Secondary | ICD-10-CM

## 2023-03-03 DIAGNOSIS — I10 Essential (primary) hypertension: Secondary | ICD-10-CM | POA: Diagnosis not present

## 2023-03-03 LAB — BASIC METABOLIC PANEL
Anion gap: 9 (ref 5–15)
BUN: 16 mg/dL (ref 8–23)
CO2: 27 mmol/L (ref 22–32)
Calcium: 9.2 mg/dL (ref 8.9–10.3)
Chloride: 105 mmol/L (ref 98–111)
Creatinine, Ser: 0.64 mg/dL (ref 0.44–1.00)
GFR, Estimated: >60 mL/min (ref >60)
Glucose, Bld: 98 mg/dL (ref 70–99)
Potassium: 3.8 mmol/L (ref 3.5–5.1)
Sodium: 141 mmol/L (ref 135–145)

## 2023-03-03 LAB — CBC
HCT: 44.4 % (ref 36.0–46.0)
Hemoglobin: 15.9 g/dL — ABNORMAL HIGH (ref 12.0–15.0)
MCH: 31.9 pg (ref 26.0–34.0)
MCHC: 35.8 g/dL (ref 30.0–36.0)
MCV: 89.2 fL (ref 80.0–100.0)
Platelets: 211 10*3/uL (ref 150–400)
RBC: 4.98 MIL/uL (ref 3.87–5.11)
RDW: 13.1 % (ref 11.5–15.5)
WBC: 5 10*3/uL (ref 4.0–10.5)
nRBC: 0 % (ref 0.0–0.2)

## 2023-03-03 NOTE — Telephone Encounter (Signed)
  Follow up Call-     02/28/2023   12:37 PM  Call back number  Post procedure Call Back phone  # [phone]  Permission to leave phone message Yes     Patient questions:  Do you have a fever, pain , or abdominal swelling? Yes.   Pain Score  0 *  Have you tolerated food without any problems? Yes.    Have you been able to return to your normal activities? Yes.    Do you have any questions about your discharge instructions: Diet   No. Medications  No. Follow up visit  No.  Do you have questions or concerns about your Care? No.  Actions: * If pain score is 4 or above: No action needed, pain <4.

## 2023-03-04 DIAGNOSIS — E663 Overweight: Secondary | ICD-10-CM | POA: Diagnosis not present

## 2023-03-04 DIAGNOSIS — Z713 Dietary counseling and surveillance: Secondary | ICD-10-CM | POA: Diagnosis not present

## 2023-03-06 MED ORDER — LACTATED RINGERS IV SOLN: INTRAVENOUS | Status: DC

## 2023-03-06 MED ORDER — CHLORHEXIDINE GLUCONATE 0.12 % MT SOLN
15.0000 mL | Freq: Once | OROMUCOSAL | Status: AC
  Administered 2023-03-07: 15 mL via OROMUCOSAL

## 2023-03-06 MED ORDER — CEFAZOLIN SODIUM-DEXTROSE 2-4 GM/100ML-% IV SOLN
2.0000 g | INTRAVENOUS | Status: AC
  Administered 2023-03-07: 2 g via INTRAVENOUS

## 2023-03-06 MED ORDER — FAMOTIDINE 20 MG PO TABS
20.0000 mg | ORAL_TABLET | Freq: Once | ORAL | Status: AC
  Administered 2023-03-07: 20 mg via ORAL

## 2023-03-06 MED ORDER — ORAL CARE MOUTH RINSE: 15.0000 mL | Freq: Once | OROMUCOSAL | Status: AC

## 2023-03-07 ENCOUNTER — Ambulatory Visit
Admission: RE | Admit: 2023-03-07 | Discharge: 2023-03-07 | Disposition: A | Discharge status: Home / Self Care | Payer: PPO | Attending: Podiatry | Admitting: Podiatry

## 2023-03-07 ENCOUNTER — Other Ambulatory Visit: Payer: Self-pay

## 2023-03-07 ENCOUNTER — Ambulatory Visit: Payer: PPO | Admitting: Registered Nurse

## 2023-03-07 ENCOUNTER — Ambulatory Visit: Payer: PPO

## 2023-03-07 ENCOUNTER — Encounter
Admission: RE | Disposition: A | Discharge status: Home / Self Care | Payer: Self-pay | Source: Home / Self Care | Attending: Podiatry

## 2023-03-07 ENCOUNTER — Ambulatory Visit: Payer: PPO | Admitting: Urgent Care

## 2023-03-07 ENCOUNTER — Encounter: Payer: Self-pay | Admitting: Podiatry

## 2023-03-07 DIAGNOSIS — I2510 Atherosclerotic heart disease of native coronary artery without angina pectoris: Secondary | ICD-10-CM | POA: Diagnosis not present

## 2023-03-07 DIAGNOSIS — X58XXXA Exposure to other specified factors, initial encounter: Secondary | ICD-10-CM | POA: Insufficient documentation

## 2023-03-07 DIAGNOSIS — M899 Disorder of bone, unspecified: Secondary | ICD-10-CM | POA: Insufficient documentation

## 2023-03-07 DIAGNOSIS — S86312A Strain of muscle(s) and tendon(s) of peroneal muscle group at lower leg level, left leg, initial encounter: Secondary | ICD-10-CM | POA: Diagnosis not present

## 2023-03-07 DIAGNOSIS — M65872 Other synovitis and tenosynovitis, left ankle and foot: Secondary | ICD-10-CM | POA: Diagnosis not present

## 2023-03-07 DIAGNOSIS — I10 Essential (primary) hypertension: Secondary | ICD-10-CM | POA: Diagnosis not present

## 2023-03-07 DIAGNOSIS — M7662 Achilles tendinitis, left leg: Secondary | ICD-10-CM | POA: Diagnosis not present

## 2023-03-07 DIAGNOSIS — M7732 Calcaneal spur, left foot: Secondary | ICD-10-CM | POA: Diagnosis not present

## 2023-03-07 HISTORY — PX: FLEXOR TENOTOMY: SHX6342

## 2023-03-07 HISTORY — PX: OSTECTOMY: SHX6439

## 2023-03-07 HISTORY — PX: ACHILLES TENDON SURGERY: SHX542

## 2023-03-07 SURGERY — REPAIR, TENDON, ACHILLES
Anesthesia: General | Site: Ankle | Laterality: Left

## 2023-03-07 MED ORDER — DEXMEDETOMIDINE HCL IN NACL 80 MCG/20ML IV SOLN
INTRAVENOUS | Status: AC
  Filled 2023-03-07: qty 20

## 2023-03-07 MED ORDER — ONDANSETRON HCL 4 MG/2ML IJ SOLN
INTRAMUSCULAR | Status: AC
  Filled 2023-03-07: qty 2

## 2023-03-07 MED ORDER — FENTANYL CITRATE (PF) 100 MCG/2ML IJ SOLN
INTRAMUSCULAR | Status: AC
  Filled 2023-03-07: qty 2

## 2023-03-07 MED ORDER — CEFAZOLIN SODIUM-DEXTROSE 2-4 GM/100ML-% IV SOLN
INTRAVENOUS | Status: AC
  Filled 2023-03-07: qty 100

## 2023-03-07 MED ORDER — KETOROLAC TROMETHAMINE 30 MG/ML IJ SOLN
INTRAMUSCULAR | Status: AC
  Filled 2023-03-07: qty 1

## 2023-03-07 MED ORDER — FENTANYL CITRATE (PF) 100 MCG/2ML IJ SOLN: 25.0000 ug | INTRAMUSCULAR | Status: DC | PRN

## 2023-03-07 MED ORDER — BUPIVACAINE-EPINEPHRINE (PF) 0.25% -1:200000 IJ SOLN
INTRAMUSCULAR | Status: DC | PRN
  Administered 2023-03-07: 20 mL

## 2023-03-07 MED ORDER — METOCLOPRAMIDE HCL 5 MG/ML IJ SOLN: 5.0000 mg | Freq: Three times a day (TID) | INTRAMUSCULAR | Status: DC | PRN

## 2023-03-07 MED ORDER — EPHEDRINE SULFATE (PRESSORS) 50 MG/ML IJ SOLN
INTRAMUSCULAR | Status: DC | PRN
  Administered 2023-03-07 (×2): 5 mg via INTRAVENOUS

## 2023-03-07 MED ORDER — ONDANSETRON HCL 4 MG/2ML IJ SOLN
INTRAMUSCULAR | Status: DC | PRN
  Administered 2023-03-07: 4 mg via INTRAVENOUS

## 2023-03-07 MED ORDER — FENTANYL CITRATE (PF) 100 MCG/2ML IJ SOLN
INTRAMUSCULAR | Status: DC | PRN
  Administered 2023-03-07 (×2): 50 ug via INTRAVENOUS

## 2023-03-07 MED ORDER — METOCLOPRAMIDE HCL 10 MG PO TABS: 5.0000 mg | ORAL_TABLET | Freq: Three times a day (TID) | ORAL | Status: DC | PRN

## 2023-03-07 MED ORDER — DROPERIDOL 2.5 MG/ML IJ SOLN: 0.6250 mg | Freq: Once | INTRAMUSCULAR | Status: DC | PRN

## 2023-03-07 MED ORDER — DEXMEDETOMIDINE HCL IN NACL 80 MCG/20ML IV SOLN
INTRAVENOUS | Status: DC | PRN
  Administered 2023-03-07: 8 ug via INTRAVENOUS

## 2023-03-07 MED ORDER — LIDOCAINE HCL (CARDIAC) PF 100 MG/5ML IV SOSY
PREFILLED_SYRINGE | INTRAVENOUS | Status: DC | PRN
  Administered 2023-03-07: 100 mg via INTRAVENOUS

## 2023-03-07 MED ORDER — ACETAMINOPHEN 10 MG/ML IV SOLN
INTRAVENOUS | Status: DC | PRN
  Administered 2023-03-07: 1000 mg via INTRAVENOUS

## 2023-03-07 MED ORDER — LIDOCAINE HCL (PF) 1 % IJ SOLN
INTRAMUSCULAR | Status: AC
  Filled 2023-03-07: qty 30

## 2023-03-07 MED ORDER — ONDANSETRON HCL 4 MG PO TABS: 4.0000 mg | ORAL_TABLET | Freq: Four times a day (QID) | ORAL | Status: DC | PRN

## 2023-03-07 MED ORDER — BUPIVACAINE HCL (PF) 0.5 % IJ SOLN
INTRAMUSCULAR | Status: AC
  Filled 2023-03-07: qty 30

## 2023-03-07 MED ORDER — ROCURONIUM BROMIDE 100 MG/10ML IV SOLN
INTRAVENOUS | Status: DC | PRN
  Administered 2023-03-07: 60 mg via INTRAVENOUS

## 2023-03-07 MED ORDER — CHLORHEXIDINE GLUCONATE 0.12 % MT SOLN
OROMUCOSAL | Status: AC
  Filled 2023-03-07: qty 15

## 2023-03-07 MED ORDER — DEXAMETHASONE SODIUM PHOSPHATE 10 MG/ML IJ SOLN
INTRAMUSCULAR | Status: AC
  Filled 2023-03-07: qty 1

## 2023-03-07 MED ORDER — SODIUM CHLORIDE 0.9 % IR SOLN
Status: DC | PRN
  Administered 2023-03-07: 500 mL

## 2023-03-07 MED ORDER — OXYCODONE HCL 5 MG PO TABS
5.0000 mg | ORAL_TABLET | ORAL | Status: AC | PRN
  Administered 2023-03-07: 5 mg via ORAL

## 2023-03-07 MED ORDER — BUPIVACAINE LIPOSOME 1.3 % IJ SUSP
INTRAMUSCULAR | Status: AC
  Filled 2023-03-07: qty 10

## 2023-03-07 MED ORDER — OXYCODONE-ACETAMINOPHEN 5-325 MG PO TABS: 1.0000 | ORAL_TABLET | Freq: Four times a day (QID) | ORAL | 0 refills | Status: DC | PRN

## 2023-03-07 MED ORDER — DEXAMETHASONE SODIUM PHOSPHATE 10 MG/ML IJ SOLN
INTRAMUSCULAR | Status: DC | PRN
  Administered 2023-03-07: 10 mg via INTRAVENOUS

## 2023-03-07 MED ORDER — MIDAZOLAM HCL 2 MG/2ML IJ SOLN
INTRAMUSCULAR | Status: DC | PRN
  Administered 2023-03-07: 2 mg via INTRAVENOUS

## 2023-03-07 MED ORDER — LIDOCAINE HCL (PF) 2 % IJ SOLN
INTRAMUSCULAR | Status: AC
  Filled 2023-03-07: qty 5

## 2023-03-07 MED ORDER — KETOROLAC TROMETHAMINE 30 MG/ML IJ SOLN
INTRAMUSCULAR | Status: DC | PRN
  Administered 2023-03-07: 30 mg via INTRAVENOUS

## 2023-03-07 MED ORDER — EPHEDRINE 5 MG/ML INJ
INTRAVENOUS | Status: AC
  Filled 2023-03-07: qty 5

## 2023-03-07 MED ORDER — MIDAZOLAM HCL 2 MG/2ML IJ SOLN
INTRAMUSCULAR | Status: AC
  Filled 2023-03-07: qty 2

## 2023-03-07 MED ORDER — PROPOFOL 10 MG/ML IV BOLUS
INTRAVENOUS | Status: AC
  Filled 2023-03-07: qty 20

## 2023-03-07 MED ORDER — ACETAMINOPHEN 10 MG/ML IV SOLN
INTRAVENOUS | Status: AC
  Filled 2023-03-07: qty 100

## 2023-03-07 MED ORDER — FAMOTIDINE 20 MG PO TABS
ORAL_TABLET | ORAL | Status: AC
  Filled 2023-03-07: qty 1

## 2023-03-07 MED ORDER — BUPIVACAINE-EPINEPHRINE (PF) 0.25% -1:200000 IJ SOLN
INTRAMUSCULAR | Status: AC
  Filled 2023-03-07: qty 30

## 2023-03-07 MED ORDER — OXYCODONE HCL 5 MG PO TABS
ORAL_TABLET | ORAL | Status: AC
  Filled 2023-03-07: qty 1

## 2023-03-07 MED ORDER — ONDANSETRON HCL 4 MG/2ML IJ SOLN: 4.0000 mg | Freq: Four times a day (QID) | INTRAMUSCULAR | Status: DC | PRN

## 2023-03-07 MED ORDER — PROPOFOL 10 MG/ML IV BOLUS
INTRAVENOUS | Status: DC | PRN
  Administered 2023-03-07: 100 mg via INTRAVENOUS

## 2023-03-07 MED ORDER — ROCURONIUM BROMIDE 10 MG/ML (PF) SYRINGE
PREFILLED_SYRINGE | INTRAVENOUS | Status: AC
  Filled 2023-03-07: qty 10

## 2023-03-07 MED ORDER — SUGAMMADEX SODIUM 200 MG/2ML IV SOLN
INTRAVENOUS | Status: DC | PRN
  Administered 2023-03-07: 180 mg via INTRAVENOUS

## 2023-03-07 MED ORDER — 0.9 % SODIUM CHLORIDE (POUR BTL) OPTIME
TOPICAL | Status: DC | PRN
  Administered 2023-03-07: 1000 mL

## 2023-03-07 SURGICAL SUPPLY — 75 items
2-0 PDS SH IMPLANT
ANCH SUT 4.5 FTPRNT PEEK-OPTM (Anchor) ×1 IMPLANT
ANCHOR 4.5 FOOTPRINT ULTRA (Anchor) IMPLANT
BIT DRILL 4X4.5 FOOTPRINT STR (BIT) ×2 IMPLANT
BLADE SURG 15 STRL LF DISP TIS (BLADE) ×2 IMPLANT
BLADE SURG 15 STRL SS (BLADE) ×2
BLADE SURG MINI STRL (BLADE) ×2 IMPLANT
BNDG CMPR 75X21 PLY HI ABS (MISCELLANEOUS) ×1
BNDG CMPR STD VLCR NS LF 5.8X4 (GAUZE/BANDAGES/DRESSINGS) ×2
BNDG ELASTIC 4X5.8 VLCR NS LF (GAUZE/BANDAGES/DRESSINGS) ×4 IMPLANT
BNDG ESMARCH 4 X 12 STRL LF (GAUZE/BANDAGES/DRESSINGS)
BNDG ESMARCH 4X12 STRL LF (GAUZE/BANDAGES/DRESSINGS) ×2 IMPLANT
BNDG ESMARCH 6 X 12 STRL LF (GAUZE/BANDAGES/DRESSINGS) ×1
BNDG ESMARCH 6X12 STRL LF (GAUZE/BANDAGES/DRESSINGS) ×2 IMPLANT
BNDG GAUZE DERMACEA FLUFF 4 (GAUZE/BANDAGES/DRESSINGS) ×2 IMPLANT
BNDG GZE 12X3 1 PLY HI ABS (GAUZE/BANDAGES/DRESSINGS) ×1
BNDG GZE DERMACEA 4 6PLY (GAUZE/BANDAGES/DRESSINGS) ×1
BNDG STRETCH GAUZE 3IN X12FT (GAUZE/BANDAGES/DRESSINGS) ×2 IMPLANT
BOOT STEPPER DURA MED (SOFTGOODS) IMPLANT
BOOT STEPPER DURA SM (SOFTGOODS) IMPLANT
DRAPE FLUOR MINI C-ARM 54X84 (DRAPES) ×2 IMPLANT
DRILL 4X4.5 FOOTPRINT STR (BIT) ×1
DURAPREP 26ML APPLICATOR (WOUND CARE) ×2 IMPLANT
ELECT REM PT RETURN 9FT ADLT (ELECTROSURGICAL) ×1
ELECTRODE REM PT RTRN 9FT ADLT (ELECTROSURGICAL) ×2 IMPLANT
GAUZE SPONGE 4X4 12PLY STRL (GAUZE/BANDAGES/DRESSINGS) ×2 IMPLANT
GAUZE STRETCH 2X75IN STRL (MISCELLANEOUS) ×2 IMPLANT
GAUZE XEROFORM 1X8 LF (GAUZE/BANDAGES/DRESSINGS) ×2 IMPLANT
GLOVE BIO SURGEON STRL SZ7.5 (GLOVE) ×2 IMPLANT
GLOVE INDICATOR 8.0 STRL GRN (GLOVE) ×2 IMPLANT
GOWN STRL REUS W/ TWL XL LVL3 (GOWN DISPOSABLE) ×4 IMPLANT
GOWN STRL REUS W/TWL XL LVL3 (GOWN DISPOSABLE) ×2
HANDLE YANKAUER SUCT BULB TIP (MISCELLANEOUS) ×2 IMPLANT
IV NS 500ML (IV SOLUTION) ×1
IV NS 500ML BAXH (IV SOLUTION) ×2 IMPLANT
KIT TURNOVER KIT A (KITS) ×2 IMPLANT
LABEL OR SOLS (LABEL) IMPLANT
MANIFOLD NEPTUNE II (INSTRUMENTS) ×2 IMPLANT
NDL FILTER BLUNT 18X1 1/2 (NEEDLE) ×2 IMPLANT
NDL HYPO 18GX1.5 BLUNT FILL (NEEDLE) ×2 IMPLANT
NDL HYPO 25X1 1.5 SAFETY (NEEDLE) ×6 IMPLANT
NDL MAYO CATGUT SZ5 (NEEDLE)
NDL SUT 5 .5 CRC TPR PNT MAYO (NEEDLE) ×2 IMPLANT
NEEDLE FILTER BLUNT 18X1 1/2 (NEEDLE) ×1 IMPLANT
NEEDLE HYPO 18GX1.5 BLUNT FILL (NEEDLE) ×1 IMPLANT
NEEDLE HYPO 25X1 1.5 SAFETY (NEEDLE) ×3 IMPLANT
NS IRRIG 1000ML POUR BTL (IV SOLUTION) IMPLANT
NS IRRIG 500ML POUR BTL (IV SOLUTION) ×2 IMPLANT
PACK EXTREMITY ARMC (MISCELLANEOUS) ×2 IMPLANT
RASP SM TEAR CROSS CUT (RASP) IMPLANT
SPLINT CAST 1 STEP 5X30 WHT (MISCELLANEOUS) ×2 IMPLANT
SPLINT PLASTER CAST FAST 5X30 (CAST SUPPLIES) ×2 IMPLANT
SPONGE T-LAP 18X18 ~~LOC~~+RFID (SPONGE) ×2 IMPLANT
STOCKINETTE IMPERVIOUS 9X36 MD (GAUZE/BANDAGES/DRESSINGS) ×2 IMPLANT
STOCKINETTE M/LG 89821 (MISCELLANEOUS) ×2 IMPLANT
STRIP CLOSURE SKIN 1/2X4 (GAUZE/BANDAGES/DRESSINGS) ×2 IMPLANT
SUT MNCRL 4-0 (SUTURE) ×2
SUT MNCRL 4-0 27XMFL (SUTURE) ×2
SUT PDS 2-0 27IN (SUTURE) IMPLANT
SUT PDS AB 0 CT1 27 (SUTURE) IMPLANT
SUT ULTRABRAID #2 38 (SUTURE) ×2 IMPLANT
SUT VIC AB 0 SH 27 (SUTURE) ×2 IMPLANT
SUT VIC AB 2-0 SH 27 (SUTURE)
SUT VIC AB 2-0 SH 27XBRD (SUTURE) ×4 IMPLANT
SUT VIC AB 3-0 SH 27 (SUTURE) ×2
SUT VIC AB 3-0 SH 27X BRD (SUTURE) ×2 IMPLANT
SUT VIC AB 4-0 FS2 27 (SUTURE) ×2 IMPLANT
SUT VICRYL AB 3-0 FS1 BRD 27IN (SUTURE) ×2 IMPLANT
SUTURE MNCRL 4-0 27XMF (SUTURE) ×2 IMPLANT
SWABSTK COMLB BENZOIN TINCTURE (MISCELLANEOUS) ×2 IMPLANT
SYR 10ML LL (SYRINGE) ×4 IMPLANT
SYR 3ML LL SCALE MARK (SYRINGE) ×2 IMPLANT
TRAP FLUID SMOKE EVACUATOR (MISCELLANEOUS) ×2 IMPLANT
WAND TOPAZ MICRO DEBRIDER (MISCELLANEOUS) IMPLANT
WATER STERILE IRR 500ML POUR (IV SOLUTION) ×2 IMPLANT

## 2023-03-07 NOTE — Discharge Instructions (Addendum)
[facility] SURGERY CENTER  POST OPERATIVE INSTRUCTIONS FOR DR. Ether Griffins AND DR. BAKER [facility] CLINIC PODIATRY DEPARTMENT   Take your medication as prescribed.  Pain medication should be taken only as needed.  Keep the dressing clean, dry and intact.  Keep your foot elevated above the heart level for the first 48 hours.  We have instructed you to be non-weight bearing.  Always wear your post-op shoe when walking.  Always use your crutches if you are to be non-weight bearing.  Do not take a shower. Baths are permissible as long as the foot is kept out of the water.   Every hour you are awake:  Bend your knee 15 times.  Call [facility] [phone]) if any of the following problems occur: You develop a temperature or fever. The bandage becomes saturated with blood. Medication does not stop your pain. Injury of the foot occurs. Any symptoms of infection including redness, odor, or red streaks running from wound.Information for Discharge Teaching: EXPAREL (bupivacaine liposome injectable suspension)   Your surgeon or anesthesiologist gave you EXPAREL(bupivacaine) to help control your pain after surgery.  EXPAREL is a local anesthetic that provides pain relief by numbing the tissue around the surgical site. EXPAREL is designed to release pain medication over time and can control pain for up to 72 hours. Depending on how you respond to EXPAREL, you may require less pain medication during your recovery.  Possible side effects: Temporary loss of sensation or ability to move in the area where bupivacaine was injected. Nausea, vomiting, constipation Rarely, numbness and tingling in your mouth or lips, lightheadedness, or anxiety may occur. Call your doctor right away if you think you may be experiencing any of these sensations, or if you have other questions regarding possible side effects.  Follow all other discharge instructions given to you by your  surgeon or nurse. Eat a healthy diet and drink plenty of water or other fluids.  If you return to the hospital for any reason within 96 hours following the administration of EXPAREL, it is important for health care providers to know that you have received this anesthetic. A teal colored band has been placed on your arm with the date, time and amount of EXPAREL you have received in order to alert and inform your health care providers. Please leave this armband in place for the full 96 hours following administration, and then you may remove the band.     AMBULATORY SURGERY  DISCHARGE INSTRUCTIONS   The drugs that you were given will stay in your system until tomorrow so for the next 24 hours you should not:  Drive an automobile Make any legal decisions Drink any alcoholic beverage  You may resume regular meals tomorrow.  Today it is better to start with liquids and gradually work up to solid foods.  You may eat anything you prefer, but it is better to start with liquids, then soup and crackers, and gradually work up to solid foods.  Please notify your doctor immediately if you have any unusual bleeding, trouble breathing, redness and pain at the surgery site, drainage, fever, or pain not relieved by medication.  Additional Instructions: PLEASE LEAVE EXPAREL (TEAL) ARMBAND ON FOR 4 DAYS  Please contact your physician with any problems or Same Day Surgery at [phone], Monday through Friday 6 am to 4 pm, or [place] at [facility] number at [phone].

## 2023-03-07 NOTE — Op Note (Signed)
Operative note   Surgeon:Layken Doenges [employer] forces logistics/support/administrative officer: None    Preop diagnosis: 1.  Achilles insertional tendinitis and tendinosis 2.  Posterior calcaneal exostosis 3.  Peroneal brevis tendon tear 4.  Peroneal longus tenosynovitis on the left lower extremity    Postop diagnosis: Same    Procedure: 1.  Secondary Achilles tendon repair 2.  Posterior calcaneal exostectomy 3.  Peroneal brevis tendon repair left ankle 4.  Peroneal longus tenolysis 5.  Intraoperative fluoroscopy without assistance of radiologist left lower extremity    EBL: Minimal    Anesthesia:local and general.  Local consists of a one-to-one mixture of 0.25% bupivacaine with epinephrine and Exparel long-acting anesthetic a total of 20 cc was used    Hemostasis: Bupivacaine with epinephrine    Specimen: None    Complications: None    Operative indications:Danielle Knapp is an 67 y.o. that presents today for surgical intervention.  The risks/benefits/alternatives/complications have been discussed and consent has been given.    Procedure:  Patient was brought into the OR and placed on the operating table in theprone position. After anesthesia was obtained theleft lower extremity was prepped and draped in usual sterile fashion.  Attention was directed to the posterior aspect of the left heel where a longitudinal incision was performed overlying the Achilles insertional site.  Sharp and blunt dissection carried down to the peritenon.  Peritenon was incised and reflected medial and lateral.  Exposure of the entire posterior aspect of the calcaneus and the Achilles insertional site was noted.  A longitudinal tendon splitting incision was performed.  This was reflected medial and lateral.  A large posterior calcaneal exostosis was noted.  This was then excised with a osteotome and smoothed with a power rasp.  The posterior superior aspect of the Achilles and calcaneus were evaluated with a small exostosis.  This was excised.  The  intratendinous scar tissue was then excised from this area.  A Paragon wand was used to infiltrate along the distal Achilles region.  Next the Achilles tendon was repaired with a 3-0 Vicryl.  The footprint 5.0 mm bone anchor was then placed into the posterior calcaneus.  A #2 FiberWire was used in a Krakw type suture along the Achilles and placed directly against the calcaneus for stabilization.  Good excursion was noted with compression to the calf.  The wound was flushed with copious amounts of irrigation.  Further repair of the Achilles tendon was performed superficially with a 3-0 Vicryl.  The peritenon was closed with a 3-0 Vicryl and the subcutaneous tissue closed with a 3-0 Vicryl the skin was closed with a 4-0 Monocryl.  Attention was then directed laterally along the posterior fibula and peroneal tendon region where an incision was made curving along the distal fibular region.  Sharp and blunt dissection carried down to the tendon sheath.  This was then entered.  At this time synovitis was noted and excised from the wound.  A low line peroneus brevis muscle belly was noted and excised proximal to the fibula.  The peroneal brevis tendon was noted to have a longitudinal split with flattening of the tendon itself.  This was debrided with a curette.  Tubularization was then performed from proximal to the fibula to just proximal to the cuboid region.  The peroneal longus tendon was noted and tenosynovitis was removed.  Both tendons were infiltrated with the Paragon wand at this time.  At this time the peroneal retinaculum was closed with a 2-0 PDS in a  pants over vest fashion.  The proximal and distal tendon sheath was closed with a 3-0 Vicryl as was the subcutaneous tissue.  The skin was closed with a 4-0 Monocryl.  Patient was placed in a neutral position with an equalizer walker boot intact.    Patient tolerated the procedure and anesthesia well.  Was transported from the OR to the PACU with all vital  signs stable and vascular status intact. To be discharged per routine protocol.  Will follow up in approximately 1 week in the outpatient clinic.

## 2023-03-07 NOTE — Anesthesia Postprocedure Evaluation (Signed)
Anesthesia Post Note  Patient: Danielle Knapp  Procedure(s) Performed: ACHILLES TENDON REPAIR - SECONDARY (Left: Ankle) HAGLUNDS RETROCALCANEAL OSTECTOMY (Left: Ankle) 96045 - FLEXOR TENDON REPAIR (Left: Ankle)  Patient location during evaluation: PACU Anesthesia Type: General Level of consciousness: awake and alert Pain management: pain level controlled Vital Signs Assessment: post-procedure vital signs reviewed and stable Respiratory status: spontaneous breathing, nonlabored ventilation, respiratory function stable and patient connected to nasal cannula oxygen Cardiovascular status: blood pressure returned to baseline and stable Postop Assessment: no apparent nausea or vomiting Anesthetic complications: no   No notable events documented.   Last Vitals:  Vitals:   03/07/23 1015 03/07/23 1027  BP: 118/66 134/76  Pulse: 77 84  Resp: 16 15  Temp: 37.1 C 37.2 C  SpO2: 100% 100%    Last Pain:  Vitals:   03/07/23 1027  TempSrc: Temporal  PainSc: 0-No pain                 Lenard Simmer

## 2023-03-07 NOTE — Anesthesia Procedure Notes (Signed)
Procedure Name: Intubation Date/Time: 03/07/2023 7:40 AM  Performed by: Morene Crocker, CRNAPre-anesthesia Checklist: Patient identified, Patient being monitored, Timeout performed, Emergency Drugs available and Suction available Patient Re-evaluated:Patient Re-evaluated prior to induction Oxygen Delivery Method: Circle system utilized Preoxygenation: Pre-oxygenation with 100% oxygen Induction Type: IV induction Ventilation: Mask ventilation without difficulty Laryngoscope Size: 3 and McGraph Grade View: Grade I Tube type: Oral Tube size: 7.0 mm Number of attempts: 1 Airway Equipment and Method: Stylet Placement Confirmation: ETT inserted through vocal cords under direct vision, positive ETCO2 and breath sounds checked- equal and bilateral Secured at: 22 cm Tube secured with: Tape Dental Injury: Teeth and Oropharynx as per pre-operative assessment  Comments: Smooth atraumatic intubation, no complications noted

## 2023-03-07 NOTE — H&P (Signed)
HISTORY AND PHYSICAL INTERVAL NOTE:  03/07/2023  7:24 AM  Danielle Knapp  has presented today for surgery, with the diagnosis of M76.62 - Achilles tenndinitis of left lower extremity M77.32 - Posterior calcaneal exostosis, left S86.312D - Peroneal tendon tear, left.  The various methods of treatment have been discussed with the patient.  No guarantees were given.  After consideration of risks, benefits and other options for treatment, the patient has consented to surgery.  I have reviewed the patients' chart and labs.     A history and physical examination was performed in my office.  The patient was reexamined.  There have been no changes to this history and physical examination.  Danielle Knapp A

## 2023-03-07 NOTE — Anesthesia Preprocedure Evaluation (Signed)
Anesthesia Evaluation  Patient identified by MRN, date of birth, ID band Patient awake    Reviewed: Allergy & Precautions, H&P , NPO status , Patient's Chart, lab work & pertinent test results, reviewed documented beta blocker date and time   History of Anesthesia Complications Negative for: history of anesthetic complications  Airway Mallampati: II  TM Distance: >3 FB Neck ROM: full    Dental  (+) Dental Advidsory Given, Caps   Pulmonary neg shortness of breath, sleep apnea (very mild, no CPAP) , neg COPD, neg recent URI   Pulmonary exam normal breath sounds clear to auscultation       Cardiovascular Exercise Tolerance: Good hypertension, (-) angina + CAD  (-) Past MI and (-) Cardiac Stents Normal cardiovascular exam(-) dysrhythmias (-) Valvular Problems/Murmurs Rhythm:regular Rate:Normal     Neuro/Psych  PSYCHIATRIC DISORDERS Anxiety Depression    negative neurological ROS     GI/Hepatic Neg liver ROS,GERD  ,,  Endo/Other  negative endocrine ROS    Renal/GU negative Renal ROS  negative genitourinary   Musculoskeletal   Abdominal   Peds  Hematology negative hematology ROS (+)   Anesthesia Other Findings Past Medical History: No date: Allergic rhinitis No date: Allergy No date: Anxiety 10/17/2020: Bladder cancer (HCC) No date: Borderline glaucoma, left     Comment:  no eye drops No date: BPPV (benign paroxysmal positional vertigo) 01/23/2021: Breast cancer (HCC) 10/17/2020: Cancer (HCC) No date: Cataract No date: Chondromalacia of patella     Comment:  left No date: Coronary artery disease     Comment:  cardiac cath 11-03-2017 done for positive ETT,  showed               very miminal nonobstrutive cad, ef 55-65%, non caridac               chest pain No date: Diverticulosis of colon No date: Endometrial cancer (HCC) 12/08/2020: Family history of breast cancer 12/08/2020: Family history of ovarian  cancer 12/08/2020: Family history of prostate cancer No date: Frequency of urination No date: GERD (gastroesophageal reflux disease) 10/14/2017: History of chest pain     Comment:  ED visit w/ admission,  hypertensive urgency;  follow up              with dr Algie Coffer--- had outpatient ETT 10-21-2017 positive              ischemia, echo 10-16-2017 G1DD, 60-65%;    s/p cardiac               cath 11-03-2017 w/ very miminal nonobstructive cad ,               chest pain non cardiac 06/01/2021: History of COVID-19 No date: History of diverticulitis No date: History of kidney stones No date: HSV-1 infection     Comment:  fever blisters 02/08/2018: Hx of adenomatous colonic polyps No date: Hypertension     Comment:  followed by pcp No date: MDD (major depressive disorder) No date: OA (osteoarthritis)     Comment:  left knee, shoulder No date: Osteoporosis No date: Polycythemia     Comment:  long hx followed by pcp and  sees hematology/               oncology--- dr Judie Petit. [place] Ped on as needed basis, chronic and              told to donate blood regularly 02/2023: Rotator cuff tear     Comment:  right No  date: Sleep apnea     Comment:  pt states minimal sleep apnea that did not require cpap No date: Stenosis of cervix No date: Uterine mass No date: Wears glasses   Reproductive/Obstetrics negative OB ROS                             Anesthesia Physical Anesthesia Plan  ASA: 2  Anesthesia Plan: General   Post-op Pain Management: Regional block*   Induction: Intravenous  PONV Risk Score and Plan: 3 and Ondansetron, Dexamethasone, Midazolam and Treatment may vary due to age or medical condition  Airway Management Planned: LMA and Oral ETT  Additional Equipment:   Intra-op Plan:   Post-operative Plan: Extubation in OR  Informed Consent: I have reviewed the patients History and Physical, chart, labs and discussed the procedure including the risks, benefits  and alternatives for the proposed anesthesia with the patient or authorized representative who has indicated his/her understanding and acceptance.     Dental Advisory Given  Plan Discussed with: Anesthesiologist, CRNA and Surgeon  Anesthesia Plan Comments:        Anesthesia Quick Evaluation

## 2023-03-07 NOTE — Transfer of Care (Signed)
Immediate Anesthesia Transfer of Care Note  Patient: Danielle Knapp  Procedure(s) Performed: ACHILLES TENDON REPAIR - SECONDARY (Left: Ankle) HAGLUNDS RETROCALCANEAL OSTECTOMY (Left: Ankle) 16109 - FLEXOR TENDON REPAIR (Left: Ankle)  Patient Location: PACU  Anesthesia Type:General  Level of Consciousness: awake, alert , and drowsy  Airway & Oxygen Therapy: Patient Spontanous Breathing and Patient connected to face mask oxygen  Post-op Assessment: Report given to RN and Post -op Vital signs reviewed and stable  Post vital signs: Reviewed and stable  Last Vitals:  Vitals Value Taken Time  BP 117/62 03/07/23 0945  Temp 36.1 0945  Pulse 77 03/07/23 0945  Resp 15 03/07/23 0945  SpO2 100 % 03/07/23 0945  Vitals shown include unvalidated device data.  Last Pain:  Vitals:   03/07/23 0636  TempSrc: Temporal  PainSc: 0-No pain         Complications: No notable events documented.

## 2023-03-13 DIAGNOSIS — M7732 Calcaneal spur, left foot: Secondary | ICD-10-CM | POA: Diagnosis not present

## 2023-03-14 ENCOUNTER — Encounter: Payer: Self-pay | Admitting: Podiatry

## 2023-04-07 DIAGNOSIS — M75121 Complete rotator cuff tear or rupture of right shoulder, not specified as traumatic: Secondary | ICD-10-CM | POA: Diagnosis not present

## 2023-04-07 DIAGNOSIS — M7581 Other shoulder lesions, right shoulder: Secondary | ICD-10-CM | POA: Diagnosis not present

## 2023-04-07 DIAGNOSIS — M7521 Bicipital tendinitis, right shoulder: Secondary | ICD-10-CM | POA: Diagnosis not present

## 2023-04-14 ENCOUNTER — Encounter: Payer: Self-pay | Admitting: Hematology and Oncology

## 2023-05-12 DIAGNOSIS — M25572 Pain in left ankle and joints of left foot: Secondary | ICD-10-CM | POA: Diagnosis not present

## 2023-05-12 DIAGNOSIS — G8929 Other chronic pain: Secondary | ICD-10-CM | POA: Diagnosis not present

## 2023-05-23 DIAGNOSIS — G8929 Other chronic pain: Secondary | ICD-10-CM | POA: Diagnosis not present

## 2023-05-23 DIAGNOSIS — M25572 Pain in left ankle and joints of left foot: Secondary | ICD-10-CM | POA: Diagnosis not present

## 2023-05-27 DIAGNOSIS — C672 Malignant neoplasm of lateral wall of bladder: Secondary | ICD-10-CM | POA: Diagnosis not present

## 2023-05-29 DIAGNOSIS — M25572 Pain in left ankle and joints of left foot: Secondary | ICD-10-CM | POA: Diagnosis not present

## 2023-05-29 DIAGNOSIS — G8929 Other chronic pain: Secondary | ICD-10-CM | POA: Diagnosis not present

## 2023-05-30 ENCOUNTER — Other Ambulatory Visit: Payer: Self-pay | Admitting: Hematology and Oncology

## 2023-06-05 DIAGNOSIS — L570 Actinic keratosis: Secondary | ICD-10-CM | POA: Diagnosis not present

## 2023-06-05 DIAGNOSIS — D485 Neoplasm of uncertain behavior of skin: Secondary | ICD-10-CM | POA: Diagnosis not present

## 2023-06-05 DIAGNOSIS — L821 Other seborrheic keratosis: Secondary | ICD-10-CM | POA: Diagnosis not present

## 2023-06-05 DIAGNOSIS — L738 Other specified follicular disorders: Secondary | ICD-10-CM | POA: Diagnosis not present

## 2023-06-05 DIAGNOSIS — L814 Other melanin hyperpigmentation: Secondary | ICD-10-CM | POA: Diagnosis not present

## 2023-06-05 DIAGNOSIS — L820 Inflamed seborrheic keratosis: Secondary | ICD-10-CM | POA: Diagnosis not present

## 2023-06-07 ENCOUNTER — Telehealth: Payer: Self-pay | Admitting: Hematology and Oncology

## 2023-06-07 NOTE — Telephone Encounter (Signed)
Rescheduled appointment per provider PAL. Left VM with appointment details.

## 2023-06-10 ENCOUNTER — Telehealth: Payer: Self-pay | Admitting: Hematology and Oncology

## 2023-06-10 NOTE — Telephone Encounter (Signed)
Rescheduled appointment per patient request. Patient is aware of changes made to her up coming appointment.

## 2023-06-19 ENCOUNTER — Ambulatory Visit: Payer: PPO | Admitting: Hematology and Oncology

## 2023-06-27 DIAGNOSIS — H0100A Unspecified blepharitis right eye, upper and lower eyelids: Secondary | ICD-10-CM | POA: Diagnosis not present

## 2023-07-11 ENCOUNTER — Ambulatory Visit: Payer: PPO | Admitting: Hematology and Oncology

## 2023-07-15 DIAGNOSIS — F331 Major depressive disorder, recurrent, moderate: Secondary | ICD-10-CM | POA: Diagnosis not present

## 2023-07-15 DIAGNOSIS — K219 Gastro-esophageal reflux disease without esophagitis: Secondary | ICD-10-CM | POA: Diagnosis not present

## 2023-07-15 DIAGNOSIS — F411 Generalized anxiety disorder: Secondary | ICD-10-CM | POA: Diagnosis not present

## 2023-07-15 DIAGNOSIS — E663 Overweight: Secondary | ICD-10-CM | POA: Diagnosis not present

## 2023-07-15 DIAGNOSIS — I10 Essential (primary) hypertension: Secondary | ICD-10-CM | POA: Diagnosis not present

## 2023-07-16 ENCOUNTER — Inpatient Hospital Stay: Payer: PPO | Attending: Hematology and Oncology | Admitting: Hematology and Oncology

## 2023-07-16 VITALS — BP 140/86 | HR 64 | Temp 97.2°F | Resp 18 | Ht 65.0 in | Wt 184.1 lb

## 2023-07-16 DIAGNOSIS — Z90722 Acquired absence of ovaries, bilateral: Secondary | ICD-10-CM | POA: Insufficient documentation

## 2023-07-16 DIAGNOSIS — Z90710 Acquired absence of both cervix and uterus: Secondary | ICD-10-CM | POA: Diagnosis not present

## 2023-07-16 DIAGNOSIS — Z923 Personal history of irradiation: Secondary | ICD-10-CM | POA: Insufficient documentation

## 2023-07-16 DIAGNOSIS — Z79811 Long term (current) use of aromatase inhibitors: Secondary | ICD-10-CM | POA: Insufficient documentation

## 2023-07-16 DIAGNOSIS — Z170 Estrogen receptor positive status [ER+]: Secondary | ICD-10-CM | POA: Insufficient documentation

## 2023-07-16 DIAGNOSIS — D751 Secondary polycythemia: Secondary | ICD-10-CM | POA: Insufficient documentation

## 2023-07-16 DIAGNOSIS — Z1721 Progesterone receptor positive status: Secondary | ICD-10-CM | POA: Diagnosis not present

## 2023-07-16 DIAGNOSIS — M8580 Other specified disorders of bone density and structure, unspecified site: Secondary | ICD-10-CM | POA: Diagnosis not present

## 2023-07-16 DIAGNOSIS — Z8542 Personal history of malignant neoplasm of other parts of uterus: Secondary | ICD-10-CM | POA: Insufficient documentation

## 2023-07-16 DIAGNOSIS — D0511 Intraductal carcinoma in situ of right breast: Secondary | ICD-10-CM | POA: Insufficient documentation

## 2023-07-16 NOTE — Assessment & Plan Note (Addendum)
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  Moderate hot flashes improved with taking anastrozole at bedtime   Breast cancer surveillance: 1.  Breast exam 07/16/2023: Benign 2. Mammogram 12/03/2022: Benign breast density category C 3.  Bone density 01/24/2023: T-score -2: Osteopenia: Recommended calcium and vitamin D and weightbearing exercises  Rotator cuff injury: Following with orthopedics.  She is hoping that she does not need surgery.   Return to clinic in 1 year for follow-up

## 2023-07-16 NOTE — Progress Notes (Signed)
Patient Care Team: Laurann Montana, MD as PCP - General (Family Medicine) Abigail Miyamoto, MD as Consulting Physician (General Surgery) Dorothy Puffer, MD as Consulting Physician (Radiation Oncology) Ardell Isaacs, Forrestine Him, MD as Consulting Physician (Obstetrics and Gynecology) Belva Agee, MD (Inactive) as Consulting Physician (Urology) Carver Fila, MD as Consulting Physician (Gynecologic Oncology)  DIAGNOSIS:  Encounter Diagnosis  Name Primary?   Ductal carcinoma in situ (DCIS) of right breast Yes    SUMMARY OF ONCOLOGIC HISTORY: Oncology History  Endometrial cancer [facility])   Initial Diagnosis   Endometrial cancer (HCC)   09/27/2019 Imaging   CT A/P: 1. Large hypoenhancing mass in the region of the lower uterus/upper cervix. This appears to expand the endometrial canal the lower uterine segment although the endometrial canal in the uterine fundus is nondilated. As such, endometrial canal/cervical stricture is not considered likely. Rather, imaging features are highly concerning for neoplasm, likely of endometrial origin. 2. No findings of metastatic disease. Specifically, no evidence for lymphadenopathy in the abdomen/pelvis. No peritoneal or omental nodularity. No ascites. 3. Left colonic diverticulosis without diverticulitis   10/09/2020 Imaging   MRI pelvis: Distended endocervical canal, as described above, favoring a lower cervical stenosis/stricture.   Mild associated polypoid soft tissue/debris in the endometrial cavity and along the lower cervix, poorly evaluated on unenhanced MR. While the endometrial soft tissue favors debris, consider hysteroscopy to exclude a cervical mass resulting in Stenosis/stricture.   10/17/2020 Initial Biopsy   EMB: A. ENDOMETRIUM MASS, RESECTION:  - Endometrioid adenocarcinoma.  - See comment.   B. ENDOMETRIUM, CURETTAGE:  - Blood and scanty benign endocervical mucosa.  - No endometrial tissue identified.    COMMENT:   A. The findings are consistent with FIGO grade 1/2.   11/09/2020 Imaging   Cystoscopy and biopsy with fulguration of papillary lesions (Dr. Benancio Deeds), Robotic-assisted laparoscopic total hysterectomy with bilateral salpingoophorectomy, bilateral pelvic LND, mini-lap for specimen removal  Findings: On EUA, cervix difficult to distinguish, flush with apex; uterus small and mobile. Blood and some tumor noted on D&C. On intra-abdominal entry, normal upper abdominal exam including liver edge, diaphragm, stomach, small and large bowel, omentum. Bilateral ovaries atrophic appearing. Uterus 6-8cm and normal appearing. Mapping unsuccessful to bilateral pelvic basins. Significant retroperitoneal fibrosis noted and significant adhesions between the bladder and cervix. Several mildly prominent pelvic lymph nodes, no frank tumor involvement. No intra-abdominal or pelvic disease noted at the end of surgery.   11/09/2020 Pathology Results   A. BLADDER TUMOR, BIOPSY:  - Low grade papillary urothelial carcinoma.  - No distinct lamina propria invasion identified.  - Muscularis propria is present and not involved.   B. CERVIX, RIGHT POSTERIOR, BIOPSY:  - Fibromuscular tissue devoid of epithelium, negative for carcinoma.   C. CERVIX, LEFT POSTERIOR, BIOPSY:  - Fibromuscular tissue with endocervical glandular type epithelium,  negative for carcinoma.   D. LYMPH NODES, RIGHT PELVIC, EXCISION:  - Five lymph nodes, negative for carcinoma (0/5).   E. LYMPH NODES, LEFT PELVIC, EXCISION:  - Four lymph nodes, negative for carcinoma (0/4).   F. UTERUS, CERVIX AND BILATERAL FALLOPIAN TUBES AND OVARIES, TOTAL  HYSTERECTOMY AND BILATERAL SALPINGO-OOPHORECTOMY:  - Endometrioid carcinoma, FIGO grade 2, with invasion less than half the  myometrium.  - No involvement of uterine serosa, cervical stroma or adnexa.  - See oncology table.   ONCOLOGY TABLE:   UTERUS, CARCINOMA OR CARCINOSARCOMA: Resection    Procedure: Total hysterectomy and bilateral salpingo-oophorectomy,  Excision of pelvic lymph nodes  Histologic Type:  Endometrioid carcinoma  Histologic Grade: FIGO grade 2  Myometrial Invasion:       Depth of Myometrial Invasion: 2 mm       Myometrial Thickness: 7 mm       Percentage of Myometrial Invasion: < 50%  Uterine Serosa Involvement: Not identified  Cervical Stroma Involvement: Not identified  Other Tissue/Organ Involvement: Not identified  Peritoneal/Ascitic Fluid: Not submitted/unknown  Lymphovascular Invasion: Not identified  Regional Lymph Nodes:       Pelvic Lymph Nodes Examined:            0 Sentinel            9 Non-sentinel            9 Total       Pelvic Lymph Nodes with Metastasis: 0        Para-aortic Lymph Nodes Examined:            0 Sentinel            0 Non-sentinel            0 Total  Distant Metastasis:       Distant Site(s) Involved: Not applicable  Pathologic Stage Classification (pTNM, AJCC 8th Edition): pT1a, pN0  Ancillary Studies: MMR/MSI testing will be ordered  Representative Tumor Block: F4  (v4.2.0.1)    12/24/2020 Genetic Testing   Negative hereditary cancer genetic testing: no pathogenic variants detected in [facility] Common Hereditary Cancers +RNA Panel.  The report date is December 24, 2020.  The Common Hereditary Cancers + RNA Panel offered by [facility] includes sequencing, deletion/duplication, and RNA testing of the following 47 genes: APC, ATM, AXIN2, BARD1, BMPR1A, BRCA1, BRCA2, BRIP1, CDH1, CDK4*, CDKN2A (p14ARF)*, CDKN2A (p16INK4a)*, CHEK2, CTNNA1, DICER1, EPCAM (Deletion/duplication testing only), GREM1 (promoter region deletion/duplication testing only), KIT, MEN1, MLH1, MSH2, MSH3, MSH6, MUTYH, NBN, NF1, NHTL1, PALB2, PDGFRA*, PMS2, POLD1, POLE, PTEN, RAD50, RAD51C, RAD51D, SDHB, SDHC, SDHD, SMAD4, SMARCA4. STK11, TP53, TSC1, TSC2, and VHL.  The following genes were evaluated for sequence changes only: SDHA and HOXB13 c.251G>A variant only.   RNA analysis is not performed for the * genes.     Ductal carcinoma in situ (DCIS) of right breast  01/30/2021 Initial Diagnosis   Ductal carcinoma in situ (DCIS) of right breast   01/31/2021 Cancer Staging   Staging form: Breast, AJCC 8th Edition - Clinical stage from 01/31/2021: Stage 0 (cTis (DCIS), cN0, cM0, ER+, PR+) - Signed by Lowella Dell, MD on 01/31/2021 Stage prefix: Initial diagnosis Nuclear grade: G1   02/16/2021 Definitive Surgery   FINAL MICROSCOPIC DIAGNOSIS:   A. BREAST, RIGHT, LUMPECTOMY:  - Intermediate and low grade ductal carcinoma in situ with calcifications.  - Atypical lobular hyperplasia.  - Biopsy site.  - Fibrocystic change with calcifications.  - Complex sclerosing lesion.  - Intraductal papilloma.  - See oncology table.    03/29/2021 - 04/25/2021 Radiation Therapy   Site Technique Total Dose (Gy) Dose per Fx (Gy) Completed Fx Beam Energies  Breast, Right: Breast_Rt 3D 42.56/42.56 2.66 16/16 10X, 6XFFF  Breast, Right: Breast_Rt_Bst 3D 8/8 2 4/4 6X, 10X     06/02/2021 -  Anti-estrogen oral therapy   Anastrozole     CHIEF COMPLIANT: Follow-up on anastrozole therapy  HISTORY OF PRESENT ILLNESS:   History of Present Illness   Danielle Knapp, a patient on anastrozole for two years, presents with significant hair loss, which she attributes to the medication. She has been taking collagen pills and a multivitamin to manage the  hair loss. She also reports a torn rotator cuff, which has been causing her discomfort and limiting her mobility. Despite the pain, she has chosen to manage the injury with exercises and has postponed surgery. In addition to these issues, Danielle Knapp has osteopenia, with a score of -2, indicating a moderate loss of bone density. She has been taking vitamin D supplements and maintains an active lifestyle to manage this condition.         ALLERGIES:  is allergic to ciprofloxacin hcl, erythromycin, metronidazole, tolmetin, ace inhibitors,  lisinopril, eszopiclone, and tape.  MEDICATIONS:  Current Outpatient Medications  Medication Sig Dispense Refill   amLODipine (NORVASC) 5 MG tablet Take 5 mg by mouth every morning.     anastrozole (ARIMIDEX) 1 MG tablet TAKE ONE TABLET BY MOUTH ONE TIME DAILY 90 tablet 3   buPROPion (WELLBUTRIN XL) 300 MG 24 hr tablet Take 300 mg by mouth every morning.     Calcium Carb-Cholecalciferol (CALCIUM 500+D3 PO) Take 1 tablet by mouth every evening.     Cholecalciferol (VITAMIN D3) 1.25 MG (50000 UT) CAPS Take 1 capsule by mouth daily.     COLLAGEN PO Take 2 tablets by mouth 2 (two) times daily.     fluticasone (FLONASE) 50 MCG/ACT nasal spray Place 2 sprays into both nostrils every morning.     losartan (COZAAR) 100 MG tablet Take 100 mg by mouth every morning.     Multiple Vitamin (MULTIVITAMIN WITH MINERALS) TABS tablet Take 1 tablet by mouth every evening.     Multiple Vitamins-Minerals (HAIR SKIN AND NAILS FORMULA PO) Take 2 tablets by mouth daily at 6 (six) AM. GUMMIES     pantoprazole (PROTONIX) 40 MG tablet Take 40 mg by mouth daily.     sertraline (ZOLOFT) 100 MG tablet Take 200 mg by mouth every morning.     valACYclovir (VALTREX) 1000 MG tablet Take 2 tablets (2,000 mg total) by mouth 2 (two) times daily as needed (for fever blisters). 30 tablet 2   zolpidem (AMBIEN) 10 MG tablet 5 mg at bedtime.     No current facility-administered medications for this visit.    PHYSICAL EXAMINATION: ECOG PERFORMANCE STATUS: 1 - Symptomatic but completely ambulatory  Vitals:   07/16/23 0756  BP: (!) 140/86  Pulse: 64  Resp: 18  Temp: (!) 97.2 F (36.2 C)  SpO2: 100%   Filed Weights   07/16/23 0756  Weight: 184 lb 1.6 oz (83.5 kg)      LABORATORY DATA:  I have reviewed the data as listed    Latest Ref Rng & Units 03/03/2023    8:07 AM 06/18/2022    7:44 AM 12/17/2021    8:11 AM  CMP  Glucose 70 - 99 mg/dL 98  93  213   BUN 8 - 23 mg/dL 16  15  15    Creatinine 0.44 - 1.00 mg/dL  0.86  5.78  4.69   Sodium 135 - 145 mmol/L 141  142  144   Potassium 3.5 - 5.1 mmol/L 3.8  4.4  4.6   Chloride 98 - 111 mmol/L 105  107  106   CO2 22 - 32 mmol/L 27  30  30    Calcium 8.9 - 10.3 mg/dL 9.2  9.6  62.9   Total Protein 6.5 - 8.1 g/dL  6.9  7.2   Total Bilirubin 0.3 - 1.2 mg/dL  0.7  0.6   Alkaline Phos 38 - 126 U/L  74  65   AST 15 -  41 U/L  16  16   ALT 0 - 44 U/L  15  18     Lab Results  Component Value Date   WBC 5.0 03/03/2023   HGB 15.9 (H) 03/03/2023   HCT 44.4 03/03/2023   MCV 89.2 03/03/2023   PLT 211 03/03/2023   NEUTROABS 4.2 06/18/2022    ASSESSMENT & PLAN:  Ductal carcinoma in situ (DCIS) of right breast 11/09/2020: TAH with BSO: Endometrioid carcinoma T1 a N0 grade 4 left and 5 right pelvic lymph nodes removed loss of MLH1 and PMS2 nuclear expression 11/09/2020: Low-grade noninvasive papillary urothelial cancer, followed by Dr. 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  Moderate hot flashes improved with taking anastrozole at bedtime 2. hair thinning   Breast cancer surveillance: 1.  Breast exam 07/16/2023: Benign 2. Mammogram 12/03/2022: Benign breast density category C 3.  Bone density 01/24/2023: T-score -2: Osteopenia: Recommended calcium and vitamin D and weightbearing exercises  Rotator cuff injury: Following with orthopedics.  She is hoping that she does not need surgery.   Return to clinic in 1 year for follow-up    No orders of the defined types were placed in this encounter.  The patient has a good understanding of the overall plan. she agrees with it. she will call with any problems that may develop before the next visit here. Total time  spent: 30 mins including face to face time and time spent for planning, charting and co-ordination of care   Tamsen Meek, MD 07/16/23

## 2023-08-25 DIAGNOSIS — D0511 Intraductal carcinoma in situ of right breast: Secondary | ICD-10-CM | POA: Diagnosis not present

## 2023-09-09 DIAGNOSIS — D0511 Intraductal carcinoma in situ of right breast: Secondary | ICD-10-CM | POA: Diagnosis not present

## 2023-10-06 NOTE — Progress Notes (Deleted)
 68 y.o. G0P0000 Married Caucasian female here for a breast and pelvic exam.    The patient is also followed for ***.  PCP: Laurann Montana, MD   Patient's last menstrual period was 09/03/2011.           Sexually active: No.  The current method of family planning is post menopausal status.    Menopausal hormone therapy:  n/a Exercising: {yes no:314532}  {types:19826} Smoker:  no  OB History     Gravida  0   Para  0   Term  0   Preterm  0   AB  0   Living  0      SAB  0   IAB  0   Ectopic  0   Multiple  0   Live Births              HEALTH MAINTENANCE: Last 2 paps: 08/10/22 neg: HR HPV neg, 09/27/20 neg: HR HPV neg History of abnormal Pap or positive HPV:  yes, 2013 had colposcopy with Dr. Brooke Bonito neg.  No treatment to cervix  Mammogram:  12/03/22  Breast density cat B, BI-RADS CAT 2 benign Colonoscopy:  02/28/23 Bone Density:  01/24/23  Result  osteopenia   Immunization History  Administered Date(s) Administered   Influenza Split 06/13/2010, 06/19/2011, 06/10/2013, 06/16/2014, 06/12/2016, 06/03/2018, 06/06/2020   Influenza,inj,Quad PF,6+ Mos 06/04/2017   Influenza-Unspecified 06/11/2019   Moderna Covid-19 Vaccine Bivalent Booster 43yrs & up 09/20/2021   PFIZER(Purple Top)SARS-COV-2 Vaccination 11/10/2019, 12/01/2019   Pfizer Covid-19 Vaccine Bivalent Booster 45yrs & up 06/18/2020, 02/25/2021   Td 08/15/2005   Tdap 07/18/2011, 06/21/2021   Zoster Recombinant(Shingrix) 12/03/2017, 03/25/2018   Zoster, Live 05/25/2010, 12/03/2017, 03/25/2018      reports that she has never smoked. She has never used smokeless tobacco. She reports current alcohol use of about 2.0 standard drinks of alcohol per week. She reports that she does not use drugs.  Past Medical History:  Diagnosis Date   Allergic rhinitis    Allergy    Anxiety    Bladder cancer (HCC) 10/17/2020   Borderline glaucoma, left    no eye drops   BPPV (benign paroxysmal positional  vertigo)    Breast cancer (HCC) 01/23/2021   Cancer (HCC) 10/17/2020   Cataract    Chondromalacia of patella    left   Coronary artery disease    cardiac cath 11-03-2017 done for positive ETT,  showed very miminal nonobstrutive cad, ef 55-65%, non caridac chest pain   Diverticulosis of colon    Endometrial cancer (HCC)    Family history of breast cancer 12/08/2020   Family history of ovarian cancer 12/08/2020   Family history of prostate cancer 12/08/2020   Frequency of urination    GERD (gastroesophageal reflux disease)    History of chest pain 10/14/2017   ED visit w/ admission,  hypertensive urgency;  follow up with dr Algie Coffer--- had outpatient ETT 10-21-2017 positive ischemia, echo 10-16-2017 G1DD, 60-65%;    s/p cardiac cath 11-03-2017 w/ very miminal nonobstructive cad , chest pain non cardiac   History of COVID-19 06/01/2021   History of diverticulitis    History of kidney stones    HSV-1 infection    fever blisters   Hx of adenomatous colonic polyps 02/08/2018   Hypertension    followed by pcp   MDD (major depressive disorder)    OA (osteoarthritis)    left knee, shoulder   Osteoporosis    Polycythemia    long  hx followed by pcp and  sees hematology/ oncology--- dr Judie Petit. [place] Ped on as needed basis, chronic and told to donate blood regularly   Rotator cuff tear 02/2023   right   Sleep apnea    pt states minimal sleep apnea that did not require cpap   Stenosis of cervix    Uterine mass    Wears glasses     Past Surgical History:  Procedure Laterality Date   ABDOMINAL HYSTERECTOMY     ACHILLES TENDON SURGERY Left 03/07/2023   Procedure: ACHILLES TENDON REPAIR - SECONDARY;  Surgeon: [name], DPM;  Location: [facility];  Service: Orthopedics/Podiatry;  Laterality: Left;   BREAST LUMPECTOMY WITH RADIOACTIVE SEED LOCALIZATION Right 02/16/2021   Procedure: RIGHT BREAST LUMPECTOMY WITH RADIOACTIVE SEED LOCALIZATION;  Surgeon: Abigail Miyamoto, MD;  Location:   SURGERY CENTER;  Service: General;  Laterality: Right;   CATARACT EXTRACTION W/ INTRAOCULAR LENS  IMPLANT, BILATERAL  2013   COLONOSCOPY  02-02-2018 dr Leone Payor   CYSTOSCOPY  10/17/2020   Procedure: CYSTOSCOPY;  Surgeon: Patton Salles, MD;  Location[facility];  Service: Gynecology;;   CYSTOSCOPY N/A 11/09/2020   Procedure: Bluford Kaufmann;  Surgeon: Belva Agee, MD;  Location: [facility];  Service: Urology;  Laterality: N/A;  Dr. Benancio Deeds needs to go first. [name] and rigid bx forceps.   DIAGNOSTIC LAPAROSCOPY  1996   DILATATION & CURETTAGE/HYSTEROSCOPY WITH MYOSURE N/A 10/17/2020   Procedure: DILATATION & CURETTAGE/HYSTEROSCOPY WITH  MYOSURE RESECTION OF ENDOMETRIAL MASS,  OPENING OF CERVICAL STENOSIS, DRAINAGE OF UTERINE FLUID;  Surgeon: Patton Salles, MD;  Location: [facility];  Service: Gynecology;  Laterality: N/A;   EYE SURGERY     FLEXOR TENOTOMY  Left 03/07/2023   Procedure: 95621 - FLEXOR TENDON REPAIR;  Surgeon: [name], DPM;  Location: [facility];  Service: Orthopedics/Podiatry;  Laterality: Left;   FRACTURE SURGERY     HEEL SPUR SURGERY Right 2015   Dr. Cleophas Dunker   HEMORRHOID SURGERY  2004   LEFT HEART CATH AND CORONARY ANGIOGRAPHY N/A 11/03/2017   Procedure: LEFT HEART CATH AND CORONARY ANGIOGRAPHY;  Surgeon: Corky Crafts, MD;  Location: [facility] INVASIVE CV LAB;  Service: Cardiovascular;  Laterality: N/A;   LYMPH NODE DISSECTION N/A 11/09/2020   Procedure: BILATERAL LYMPH NODE DISSECTION;MINI LAPAROTOMY;  Surgeon: Carver Fila, MD;  Location: [facility];  Service: Gynecology;  Laterality: N/A;   OPERATIVE ULTRASOUND N/A 10/17/2020   Procedure: OPERATIVE ULTRASOUND;  Surgeon: Patton Salles, MD;  Location: [facility];  Service: Gynecology;  Laterality: N/A;   OSTECTOMY Left 03/07/2023   Procedure: HAGLUNDS RETROCALCANEAL OSTECTOMY;  Surgeon: [name], DPM;  Location: [facility];  Service:  Orthopedics/Podiatry;  Laterality: Left;   ROBOTIC ASSISTED TOTAL HYSTERECTOMY WITH BILATERAL SALPINGO OOPHERECTOMY Bilateral 11/09/2020   Procedure: XI ROBOTIC ASSISTED TOTAL HYSTERECTOMY WITH BILATERAL SALPINGO OOPHORECTOMY,;  Surgeon: Carver Fila, MD;  Location: [facility];  Service: Gynecology;  Laterality: Bilateral;   SEPTOPLASTY  x2 1970's   TONSILLECTOMY  age 87   TRANSURETHRAL RESECTION OF BLADDER TUMOR N/A 11/09/2020   Procedure: TRANSURETHRAL RESECTION OF BLADDER TUMOR (TURBT);  Surgeon: Belva Agee, MD;  Location: [facility];  Service: Urology;  Laterality: N/A;    Current Outpatient Medications  Medication Sig Dispense Refill   amLODipine (NORVASC) 5 MG tablet Take 5 mg by mouth every morning.     anastrozole (ARIMIDEX) 1 MG tablet TAKE ONE TABLET BY MOUTH ONE TIME  DAILY 90 tablet 3   buPROPion (WELLBUTRIN XL) 300 MG 24 hr tablet Take 300 mg by mouth every morning.     Calcium Carb-Cholecalciferol (CALCIUM 500+D3 PO) Take 1 tablet by mouth every evening.     Cholecalciferol (VITAMIN D3) 1.25 MG (50000 UT) CAPS Take 1 capsule by mouth daily.     COLLAGEN PO Take 2 tablets by mouth 2 (two) times daily.     fluticasone (FLONASE) 50 MCG/ACT nasal spray Place 2 sprays into both nostrils every morning.     losartan (COZAAR) 100 MG tablet Take 100 mg by mouth every morning.     Multiple Vitamin (MULTIVITAMIN WITH MINERALS) TABS tablet Take 1 tablet by mouth every evening.     Multiple Vitamins-Minerals (HAIR SKIN AND NAILS FORMULA PO) Take 2 tablets by mouth daily at 6 (six) AM. GUMMIES     pantoprazole (PROTONIX) 40 MG tablet Take 40 mg by mouth daily.     sertraline (ZOLOFT) 100 MG tablet Take 200 mg by mouth every morning.     valACYclovir (VALTREX) 1000 MG tablet Take 2 tablets (2,000 mg total) by mouth 2 (two) times daily as needed (for fever blisters). 30 tablet 2   zolpidem (AMBIEN) 10 MG tablet 5 mg at bedtime.     No current facility-administered medications for this  visit.    ALLERGIES: Ciprofloxacin hcl, Erythromycin, Metronidazole, Tolmetin, Ace inhibitors, Lisinopril, Eszopiclone, and Tape  Family History  Problem Relation Age of Onset   Osteoporosis Mother    Heart attack Mother    Alzheimer's disease Mother    Arthritis Mother    Depression Mother    Diabetes Father    Hypertension Father    Heart failure Father    Alcohol abuse Father    COPD Father    Ovarian cancer Sister 68   Stroke Maternal Grandmother    Prostate cancer Maternal Grandfather 74       metastatic   Cancer Maternal Grandfather    Breast cancer Other        MGM's sister; dx after 32   Vision loss Paternal Grandmother    Colon cancer Neg Hx    Colon polyps Neg Hx    Esophageal cancer Neg Hx    Rectal cancer Neg Hx    Stomach cancer Neg Hx    Endometrial cancer Neg Hx    Pancreatic cancer Neg Hx     Review of Systems  PHYSICAL EXAM:  LMP 09/03/2011     General appearance: alert, cooperative and appears stated age Head: normocephalic, without obvious abnormality, atraumatic Neck: no adenopathy, supple, symmetrical, trachea midline and thyroid normal to inspection and palpation Lungs: clear to auscultation bilaterally Breasts: normal appearance, no masses or tenderness, No nipple retraction or dimpling, No nipple discharge or bleeding, No axillary adenopathy Heart: regular rate and rhythm Abdomen: soft, non-tender; no masses, no organomegaly Extremities: extremities normal, atraumatic, no cyanosis or edema Skin: skin color, texture, turgor normal. No rashes or lesions Lymph nodes: cervical, supraclavicular, and axillary nodes normal. Neurologic: grossly normal  Pelvic: External genitalia:  no lesions              No abnormal inguinal nodes palpated.              Urethra:  normal appearing urethra with no masses, tenderness or lesions              Bartholins and Skenes: normal  Vagina: normal appearing vagina with normal color and discharge,  no lesions              Cervix: no lesions              Pap taken: {yes no:314532} Bimanual Exam:  Uterus:  normal size, contour, position, consistency, mobility, non-tender              Adnexa: no mass, fullness, tenderness              Rectal exam: {yes no:314532}.  Confirms.              Anus:  normal sphincter tone, no lesions  Chaperone was present for exam:  {BSCHAPERONE:31226::"Marleni Gallardo F, CMA"}  ASSESSMENT: Encounter for breast and pelvic exam.   ***  PLAN: Mammogram screening discussed. Self breast awareness reviewed. Pap and HRV collected:  {yes no:314532} Guidelines for Calcium, Vitamin D, regular exercise program including cardiovascular and weight bearing exercise. Medication refills:  *** {LABS (Optional):23779} Follow up:  ***    Additional counseling given.  {yes [id number]. ***  total time was spent for this patient encounter, including preparation, face-to-face counseling with the patient, coordination of care, and documentation of the encounter in addition to doing the breast and pelvic exam.

## 2023-10-17 ENCOUNTER — Encounter: Payer: Self-pay | Admitting: Emergency Medicine

## 2023-10-17 ENCOUNTER — Other Ambulatory Visit: Payer: Self-pay

## 2023-10-17 ENCOUNTER — Emergency Department
Admission: EM | Admit: 2023-10-17 | Discharge: 2023-10-17 | Disposition: A | Discharge status: Home / Self Care | Payer: PPO | Attending: Emergency Medicine | Admitting: Emergency Medicine

## 2023-10-17 ENCOUNTER — Emergency Department: Payer: PPO

## 2023-10-17 DIAGNOSIS — N201 Calculus of ureter: Secondary | ICD-10-CM

## 2023-10-17 DIAGNOSIS — N132 Hydronephrosis with renal and ureteral calculous obstruction: Secondary | ICD-10-CM | POA: Insufficient documentation

## 2023-10-17 DIAGNOSIS — R109 Unspecified abdominal pain: Secondary | ICD-10-CM | POA: Diagnosis present

## 2023-10-17 LAB — CBC
HCT: 46.7 % — ABNORMAL HIGH (ref 36.0–46.0)
Hemoglobin: 17.1 g/dL — ABNORMAL HIGH (ref 12.0–15.0)
MCH: 31.7 pg (ref 26.0–34.0)
MCHC: 36.6 g/dL — ABNORMAL HIGH (ref 30.0–36.0)
MCV: 86.6 fL (ref 80.0–100.0)
Platelets: 259 10*3/uL (ref 150–400)
RBC: 5.39 MIL/uL — ABNORMAL HIGH (ref 3.87–5.11)
RDW: 12.5 % (ref 11.5–15.5)
WBC: 8.6 10*3/uL (ref 4.0–10.5)
nRBC: 0 % (ref 0.0–0.2)

## 2023-10-17 LAB — URINALYSIS, ROUTINE W REFLEX MICROSCOPIC
Bilirubin Urine: NEGATIVE
Glucose, UA: NEGATIVE mg/dL
Ketones, ur: NEGATIVE mg/dL
Leukocytes,Ua: NEGATIVE
Nitrite: NEGATIVE
Protein, ur: NEGATIVE mg/dL
RBC / HPF: >50 RBC/hpf (ref 0–5)
Specific Gravity, Urine: 1.012 (ref 1.005–1.030)
Squamous Epithelial / HPF: 0 /[HPF] (ref 0–5)
pH: 7 (ref 5.0–8.0)

## 2023-10-17 LAB — COMPREHENSIVE METABOLIC PANEL
ALT: 20 U/L (ref 0–44)
AST: 20 U/L (ref 15–41)
Albumin: 4.4 g/dL (ref 3.5–5.0)
Alkaline Phosphatase: 78 U/L (ref 38–126)
Anion gap: 13 (ref 5–15)
BUN: 15 mg/dL (ref 8–23)
CO2: 23 mmol/L (ref 22–32)
Calcium: 9.4 mg/dL (ref 8.9–10.3)
Chloride: 101 mmol/L (ref 98–111)
Creatinine, Ser: 0.81 mg/dL (ref 0.44–1.00)
GFR, Estimated: >60 mL/min (ref >60)
Glucose, Bld: 127 mg/dL — ABNORMAL HIGH (ref 70–99)
Potassium: 3.6 mmol/L (ref 3.5–5.1)
Sodium: 137 mmol/L (ref 135–145)
Total Bilirubin: 1.1 mg/dL (ref 0.0–1.2)
Total Protein: 7.1 g/dL (ref 6.5–8.1)

## 2023-10-17 LAB — LIPASE, BLOOD: Lipase: 30 U/L (ref 11–51)

## 2023-10-17 MED ORDER — OXYCODONE HCL 5 MG PO TABS: 5.0000 mg | ORAL_TABLET | Freq: Three times a day (TID) | ORAL | 0 refills | Status: DC | PRN

## 2023-10-17 MED ORDER — FENTANYL CITRATE PF 50 MCG/ML IJ SOSY
50.0000 ug | PREFILLED_SYRINGE | INTRAMUSCULAR | Status: DC | PRN
  Administered 2023-10-17: 50 ug via INTRAVENOUS
  Filled 2023-10-17: qty 1

## 2023-10-17 MED ORDER — ONDANSETRON HCL 4 MG/2ML IJ SOLN
4.0000 mg | Freq: Once | INTRAMUSCULAR | Status: AC
  Administered 2023-10-17: 4 mg via INTRAVENOUS
  Filled 2023-10-17: qty 2

## 2023-10-17 MED ORDER — KETOROLAC TROMETHAMINE 15 MG/ML IJ SOLN
15.0000 mg | Freq: Once | INTRAMUSCULAR | Status: AC
  Administered 2023-10-17: 15 mg via INTRAVENOUS
  Filled 2023-10-17: qty 1

## 2023-10-17 MED ORDER — ONDANSETRON HCL 4 MG PO TABS: 4.0000 mg | ORAL_TABLET | Freq: Three times a day (TID) | ORAL | 0 refills | Status: DC | PRN

## 2023-10-17 NOTE — ED Notes (Signed)
See triage notes. Patient c/o lower back and abdominal pain since Wednesday with dysuria, fevers and chills. Patient has had some N/V

## 2023-10-17 NOTE — ED Provider Notes (Signed)
[facility] Provider Note    Event Date/Time   First MD Initiated Contact with Patient 10/17/23 [phone]     (approximate)   History   Abdominal Pain and Emesis   HPI Danielle Knapp is a 68 y.o. female presenting today for left flank pain.  Patient states onset of pain symptoms last night on her left flank and left lower abdomen.  Described as sharp pain.  Also having intermittent nausea and vomiting.  Otherwise denies fever, chills, chest pain, shortness of breath, diarrhea, constipation.  No dysuria or hematuria.  Does note prior history of kidney stones.  Prior hysterectomy as well as procedure on bladder cancer for abdominal surgeries.     Physical Exam   Triage Vital Signs: ED Triage Vitals  Encounter Vitals Group     BP 10/17/23 0325 (!) 171/96     Systolic BP Percentile --      Diastolic BP Percentile --      Pulse Rate 10/17/23 0325 69     Resp 10/17/23 0325 16     Temp 10/17/23 0328 98.6 F (37 C)     Temp Source 10/17/23 0328 Oral     SpO2 10/17/23 0325 99 %     Weight 10/17/23 0325 182 lb 15.7 oz (83 kg)     Height 10/17/23 0325 5\' 5"  (1.651 m)     Head Circumference --      Peak Flow --      Pain Score 10/17/23 0325 9     Pain Loc --      Pain Education --      Exclude from Growth Chart --     Most recent vital signs: Vitals:   10/17/23 0328 10/17/23 0714  BP:  (!) 154/96  Pulse:  70  Resp:  16  Temp: 98.6 F (37 C) 98.3 F (36.8 C)  SpO2:  99%   Physical Exam: I have reviewed the vital signs and nursing notes. General: Awake, alert, no acute distress.  Nontoxic appearing. Head:  Atraumatic, normocephalic.   ENT:  EOM intact, PERRL. Oral mucosa is pink and moist with no lesions. Neck: Neck is supple with full range of motion, No meningeal signs. Cardiovascular:  RRR, No murmurs. Peripheral pulses palpable and equal bilaterally. Respiratory:  Symmetrical chest wall expansion.  No rhonchi, rales, or wheezes.  Good air  movement throughout.  No use of accessory muscles.   Musculoskeletal:  No cyanosis or edema. Moving extremities with full ROM Abdomen:  Soft, mild left lower quadrant tenderness to palpation, nondistended. Neuro:  GCS 15, moving all four extremities, interacting appropriately. Speech clear. Psych:  Calm, appropriate.   Skin:  Warm, dry, no rash.     ED Results / Procedures / Treatments   Labs (all labs ordered are listed, but only abnormal results are displayed) Labs Reviewed  COMPREHENSIVE METABOLIC PANEL - Abnormal; Notable for the following components:      Result Value   Glucose, Bld 127 (*)    All other components within normal limits  CBC - Abnormal; Notable for the following components:   RBC 5.39 (*)    Hemoglobin 17.1 (*)    HCT 46.7 (*)    MCHC 36.6 (*)    All other components within normal limits  URINALYSIS, ROUTINE W REFLEX MICROSCOPIC - Abnormal; Notable for the following components:   Color, Urine YELLOW (*)    APPearance CLOUDY (*)    Hgb urine dipstick MODERATE (*)  Bacteria, UA RARE (*)    All other components within normal limits  LIPASE, BLOOD     EKG    RADIOLOGY Independently interpreted CT imaging with 2 mm stone in the left UVJ   PROCEDURES:  Critical Care performed: No  Procedures   MEDICATIONS ORDERED IN ED: Medications  fentaNYL (SUBLIMAZE) injection 50 mcg (50 mcg Intravenous Given 10/17/23 0554)  ketorolac (TORADOL) 15 MG/ML injection 15 mg (15 mg Intravenous Given 10/17/23 0734)  ondansetron (ZOFRAN) injection 4 mg (4 mg Intravenous Given 10/17/23 0732)     IMPRESSION / MDM / ASSESSMENT AND PLAN / ED COURSE  I reviewed the triage vital signs and the nursing notes.                              Differential diagnosis includes, but is not limited to, kidney stone, diverticulitis, acute cystitis, pyelonephritis, colitis, enteritis  Patient's presentation is most consistent with acute complicated illness / injury requiring  diagnostic workup.  Patient is a 68 year old female presenting today for left flank pain.  Vital signs are stable.  Patient initially given fentanyl for pain control.  Laboratory workup reassuring.  No evidence of a UTI.  CT imaging shows 2 mm kidney stone in the left UVJ.  Patient reassessed and given Toradol and Zofran.  Pain symptoms well-controlled and able to tolerate p.o.  Patient will be discharged with symptomatic management and expected passing of the stone.  She was given strict return precautions.  Clinical Course as of 10/17/23 4540  Fri Oct 17, 2023  0808 Reassessed and feeling better.  Able to tolerate p.o. without issue.  Will discharge [DW]    Clinical Course User Index [DW] Janith Lima, MD     FINAL CLINICAL IMPRESSION(S) / ED DIAGNOSES   Final diagnoses:  Ureterolithiasis     Rx / DC Orders   ED Discharge Orders          Ordered    ondansetron (ZOFRAN) 4 MG tablet  Every 8 hours PRN        10/17/23 0822    oxyCODONE (ROXICODONE) 5 MG immediate release tablet  Every 8 hours PRN        10/17/23 9811             Note:  This document was prepared using Dragon voice recognition software and may include unintentional dictation errors.   Janith Lima, MD 10/17/23 [phone]

## 2023-10-17 NOTE — Discharge Instructions (Addendum)
You can take ibuprofen 600 mg every 8 hours for the next several days scheduled and then as needed afterwards.  Please take this medication with food.  I have also sent pain and nausea medication if symptoms become more severe.  If this does not control symptoms well enough, please return to the emergency department for further evaluation.  If you use the oxycodone for more severe pain, do not drive as it can make you sleepy.

## 2023-10-17 NOTE — ED Triage Notes (Signed)
Patient with flank/ abdominal pain since Wednesday, dysuria, fevers, chills. Points to L side lower abdominal area, but states the pain originated in her back. Endorses n/v.

## 2023-10-20 ENCOUNTER — Encounter: Payer: HMO | Admitting: Obstetrics and Gynecology

## 2023-10-21 ENCOUNTER — Inpatient Hospital Stay (HOSPITAL_COMMUNITY): Payer: PPO | Admitting: Anesthesiology

## 2023-10-21 ENCOUNTER — Ambulatory Visit (HOSPITAL_COMMUNITY)
Admission: RE | Admit: 2023-10-21 | Discharge: 2023-10-21 | Disposition: A | Discharge status: Home / Self Care | Payer: PPO | Source: Ambulatory Visit | Attending: Urology | Admitting: Urology

## 2023-10-21 ENCOUNTER — Inpatient Hospital Stay (HOSPITAL_BASED_OUTPATIENT_CLINIC_OR_DEPARTMENT_OTHER): Payer: PPO | Admitting: Anesthesiology

## 2023-10-21 ENCOUNTER — Encounter (HOSPITAL_COMMUNITY): Payer: Self-pay | Admitting: Urology

## 2023-10-21 ENCOUNTER — Inpatient Hospital Stay (HOSPITAL_COMMUNITY): Payer: PPO

## 2023-10-21 ENCOUNTER — Encounter (HOSPITAL_COMMUNITY)
Admission: RE | Disposition: A | Discharge status: Home / Self Care | Payer: Self-pay | Source: Ambulatory Visit | Attending: Urology

## 2023-10-21 DIAGNOSIS — I2510 Atherosclerotic heart disease of native coronary artery without angina pectoris: Secondary | ICD-10-CM | POA: Insufficient documentation

## 2023-10-21 DIAGNOSIS — F419 Anxiety disorder, unspecified: Secondary | ICD-10-CM | POA: Diagnosis not present

## 2023-10-21 DIAGNOSIS — I10 Essential (primary) hypertension: Secondary | ICD-10-CM

## 2023-10-21 DIAGNOSIS — N201 Calculus of ureter: Secondary | ICD-10-CM

## 2023-10-21 DIAGNOSIS — F418 Other specified anxiety disorders: Secondary | ICD-10-CM | POA: Diagnosis not present

## 2023-10-21 DIAGNOSIS — K219 Gastro-esophageal reflux disease without esophagitis: Secondary | ICD-10-CM | POA: Insufficient documentation

## 2023-10-21 DIAGNOSIS — F32A Depression, unspecified: Secondary | ICD-10-CM | POA: Diagnosis not present

## 2023-10-21 DIAGNOSIS — Z01818 Encounter for other preprocedural examination: Secondary | ICD-10-CM

## 2023-10-21 DIAGNOSIS — Z79899 Other long term (current) drug therapy: Secondary | ICD-10-CM | POA: Insufficient documentation

## 2023-10-21 HISTORY — PX: CYSTOSCOPY/RETROGRADE/URETEROSCOPY: SHX5316

## 2023-10-21 SURGERY — CYSTOSCOPY/RETROGRADE/URETEROSCOPY
Anesthesia: General | Laterality: Left

## 2023-10-21 MED ORDER — ACETAMINOPHEN 500 MG PO TABS
1000.0000 mg | ORAL_TABLET | Freq: Once | ORAL | Status: AC
  Administered 2023-10-21: 1000 mg via ORAL
  Filled 2023-10-21: qty 2

## 2023-10-21 MED ORDER — CEFAZOLIN SODIUM-DEXTROSE 1-4 GM/50ML-% IV SOLN
INTRAVENOUS | Status: DC | PRN
  Administered 2023-10-21: 2 g via INTRAVENOUS

## 2023-10-21 MED ORDER — FENTANYL CITRATE (PF) 100 MCG/2ML IJ SOLN
INTRAMUSCULAR | Status: DC | PRN
  Administered 2023-10-21: 50 ug via INTRAVENOUS

## 2023-10-21 MED ORDER — CEFAZOLIN SODIUM-DEXTROSE 2-4 GM/100ML-% IV SOLN
2.0000 g | Freq: Once | INTRAVENOUS | Status: DC
  Filled 2023-10-21: qty 100

## 2023-10-21 MED ORDER — SODIUM CHLORIDE 0.9% FLUSH: 3.0000 mL | Freq: Two times a day (BID) | INTRAVENOUS | Status: DC

## 2023-10-21 MED ORDER — ONDANSETRON HCL 4 MG/2ML IJ SOLN
INTRAMUSCULAR | Status: DC | PRN
  Administered 2023-10-21: 4 mg via INTRAVENOUS

## 2023-10-21 MED ORDER — PHENYLEPHRINE 80 MCG/ML (10ML) SYRINGE FOR IV PUSH (FOR BLOOD PRESSURE SUPPORT)
PREFILLED_SYRINGE | INTRAVENOUS | Status: AC
  Filled 2023-10-21: qty 10

## 2023-10-21 MED ORDER — OXYCODONE HCL 5 MG/5ML PO SOLN: 5.0000 mg | Freq: Once | ORAL | Status: DC | PRN

## 2023-10-21 MED ORDER — LIDOCAINE HCL (PF) 2 % IJ SOLN
INTRAMUSCULAR | Status: AC
  Filled 2023-10-21: qty 5

## 2023-10-21 MED ORDER — PROPOFOL 10 MG/ML IV BOLUS
INTRAVENOUS | Status: AC
  Filled 2023-10-21: qty 20

## 2023-10-21 MED ORDER — LIDOCAINE HCL 2 % IJ SOLN
INTRAMUSCULAR | Status: AC
  Filled 2023-10-21: qty 20

## 2023-10-21 MED ORDER — MIDAZOLAM HCL 2 MG/2ML IJ SOLN
INTRAMUSCULAR | Status: AC
  Filled 2023-10-21: qty 2

## 2023-10-21 MED ORDER — SODIUM CHLORIDE 0.9 % IV SOLN: INTRAVENOUS | Status: DC | PRN

## 2023-10-21 MED ORDER — ONDANSETRON HCL 4 MG/2ML IJ SOLN
INTRAMUSCULAR | Status: AC
  Filled 2023-10-21: qty 2

## 2023-10-21 MED ORDER — CHLORHEXIDINE GLUCONATE 0.12 % MT SOLN: 15.0000 mL | Freq: Once | OROMUCOSAL | Status: DC

## 2023-10-21 MED ORDER — LIDOCAINE HCL (CARDIAC) PF 100 MG/5ML IV SOSY
PREFILLED_SYRINGE | INTRAVENOUS | Status: DC | PRN
  Administered 2023-10-21: 100 mg via INTRAVENOUS

## 2023-10-21 MED ORDER — 0.9 % SODIUM CHLORIDE (POUR BTL) OPTIME
TOPICAL | Status: DC | PRN
  Administered 2023-10-21: 1000 mL

## 2023-10-21 MED ORDER — ORAL CARE MOUTH RINSE: 15.0000 mL | Freq: Once | OROMUCOSAL | Status: DC

## 2023-10-21 MED ORDER — DEXAMETHASONE SODIUM PHOSPHATE 4 MG/ML IJ SOLN
INTRAMUSCULAR | Status: DC | PRN
  Administered 2023-10-21: 8 mg via INTRAVENOUS

## 2023-10-21 MED ORDER — PROPOFOL 10 MG/ML IV BOLUS
INTRAVENOUS | Status: DC | PRN
  Administered 2023-10-21: 50 mg via INTRAVENOUS
  Administered 2023-10-21: 150 mg via INTRAVENOUS
  Administered 2023-10-21: 50 mg via INTRAVENOUS

## 2023-10-21 MED ORDER — FENTANYL CITRATE (PF) 100 MCG/2ML IJ SOLN
INTRAMUSCULAR | Status: AC
  Filled 2023-10-21: qty 2

## 2023-10-21 MED ORDER — OXYCODONE HCL 5 MG PO TABS: 5.0000 mg | ORAL_TABLET | Freq: Once | ORAL | Status: DC | PRN

## 2023-10-21 MED ORDER — DEXAMETHASONE SODIUM PHOSPHATE 10 MG/ML IJ SOLN
INTRAMUSCULAR | Status: AC
  Filled 2023-10-21: qty 1

## 2023-10-21 MED ORDER — MIDAZOLAM HCL 2 MG/2ML IJ SOLN
INTRAMUSCULAR | Status: DC | PRN
Start: 2023-10-21 — End: 2023-10-21
  Administered 2023-10-21: 2 mg via INTRAVENOUS

## 2023-10-21 MED ORDER — FENTANYL CITRATE PF 50 MCG/ML IJ SOSY
25.0000 ug | PREFILLED_SYRINGE | INTRAMUSCULAR | Status: DC | PRN
Start: 2023-10-21 — End: 2023-10-21

## 2023-10-21 MED ORDER — AMISULPRIDE (ANTIEMETIC) 5 MG/2ML IV SOLN: 10.0000 mg | Freq: Once | INTRAVENOUS | Status: DC | PRN

## 2023-10-21 MED ORDER — LACTATED RINGERS IV SOLN: INTRAVENOUS | Status: DC

## 2023-10-21 MED ORDER — SODIUM CHLORIDE 0.9 % IR SOLN
Status: DC | PRN
  Administered 2023-10-21: 3000 mL

## 2023-10-21 SURGICAL SUPPLY — 22 items
BAG URO CATCHER STRL LF (MISCELLANEOUS) ×2 IMPLANT
BASKET STONE NCOMPASS (UROLOGICAL SUPPLIES) IMPLANT
CATH URETERAL DUAL LUMEN 10F (MISCELLANEOUS) IMPLANT
CATH URETL OPEN 5X70 (CATHETERS) IMPLANT
CLOTH BEACON ORANGE TIMEOUT ST (SAFETY) ×2 IMPLANT
EXTRACTOR STONE NITINOL NGAGE (UROLOGICAL SUPPLIES) IMPLANT
GLOVE SURG SS PI 8.0 STRL IVOR (GLOVE) ×2 IMPLANT
GOWN SPEC L4 XLG W/TWL (GOWN DISPOSABLE) ×2 IMPLANT
GUIDEWIRE STR DUAL SENSOR (WIRE) ×2 IMPLANT
IV NS IRRIG 3000ML ARTHROMATIC (IV SOLUTION) ×2 IMPLANT
KIT TURNOVER KIT A (KITS) IMPLANT
LASER FIB FLEXIVA PULSE ID 365 (Laser) IMPLANT
LASER FIB FLEXIVA PULSE ID 550 (Laser) IMPLANT
LASER FIB FLEXIVA PULSE ID 910 (Laser) IMPLANT
MANIFOLD NEPTUNE II (INSTRUMENTS) ×2 IMPLANT
PACK CYSTO (CUSTOM PROCEDURE TRAY) ×2 IMPLANT
SHEATH NAVIGATOR HD 11/13X28 (SHEATH) IMPLANT
SHEATH NAVIGATOR HD 11/13X36 (SHEATH) IMPLANT
STENT URET 6FRX22 CONTOUR (STENTS) IMPLANT
TRACTIP FLEXIVA PULS ID 200XHI (Laser) IMPLANT
TUBING CONNECTING 10 (TUBING) ×2 IMPLANT
TUBING UROLOGY SET (TUBING) ×2 IMPLANT

## 2023-10-21 NOTE — Interval H&P Note (Signed)
History and Physical Interval Note: She continues to have pain.   10/21/2023 2:29 PM  Danielle Knapp  has presented today for surgery, with the diagnosis of LEFT URETERAL STONE.  The various methods of treatment have been discussed with the patient and family. After consideration of risks, benefits and other options for treatment, the patient has consented to  Procedure(s): CYSTOSCOPY/RETROGRADE/URETEROSCOPY/POSSIBLE LASER (Left) as a surgical intervention.  The patient's history has been reviewed, patient examined, no change in status, stable for surgery.  I have reviewed the patient's chart and labs.  Questions were answered to the patient's satisfaction.     Bjorn Pippin

## 2023-10-21 NOTE — H&P (Signed)
I have ureteral stone.  HPI: Danielle Knapp is a 68 year-old female established patient who is here for ureteral stone.    2/17/25Stanton Knapp had the onset last Weds of left flank pain with nausea. She was seen in the ER on 2/14 and a CT showed a 4mm RUP stone and a 2mm LUVJ stone. She has some urgency but she is pushing fluids. She last had nausea med last night and she took Ibuprofen this morning. Her pain is still an 8/10. She has had chills without fever. She has had no gross hematuria. She had a stone about 12-13 years ago. She had seen me for LG NMIBC and her cystoscopy in 9/24 was negative. her UA has 3-10 RBC's.      ALLERGIES: Ciprofloxacin - Anaphylaxis erythramycine - Anaphylaxis Flagyl - Anaphylaxis    MEDICATIONS: Amlodipine Besylate 10 mg tablet  Anastrozole 1 mg tablet  Bupropion Xl 300 mg tablet, extended release 24 hr  Calcium  Collagen  Fluticasone Propionate  Losartan Potassium 100 mg tablet  Meclizine Hcl 25 mg tablet  Multivitamin  Ondansetron Hcl  Oxycodone Hcl  Protonix  Sertraline Hcl 150 mg capsule  Vitamin D3  Zolpidem Tartrate 5 mg tablet     GU PSH: Cystoscopy - 05/27/2023, 11/20/2022, 08/22/2022, 05/27/2022, 03/01/2022, 11/29/2021, 08/16/2021, 05/30/2021, 2022 Cystoscopy TURBT <2 cm - 2022     NON-GU PSH: Breast lumpectomy, Right - 2022 Cataract surgery D&&C Tonsillectomy.. Visit Complexity (formerly GPC1X) - 05/27/2023     GU PMH: Bladder Cancer Lateral - 05/27/2023, - 11/20/2022, - 08/22/2022, - 05/27/2022, - 03/01/2022, - 11/29/2021 Bladder Cancer overlapping sites - 08/16/2021, - 06/28/2021, - 05/30/2021, - 2022, - 2022 Bladder tumor/neoplasm - 2022    NON-GU PMH: Pyuria/other UA findings - 08/16/2021, - 06/28/2021, - 05/30/2021 Anxiety Breast Cancer, History GERD Hypertension Skin Cancer, History    FAMILY HISTORY: No Family History     Notes: mom deceased -COPD  father deceased-Stroke  grandfather-prostate cancer   SOCIAL HISTORY: Marital  Status: Married Preferred Language: English; Ethnicity: Not Hispanic Or Latino; Race: White Current Smoking Status: Patient has never smoked.   Tobacco Use Assessment Completed: Used Tobacco in last 30 days? Does not use smokeless tobacco. Social Drinker.  Does not use drugs. Drinks 1 caffeinated drink per day. Has not had a blood transfusion.    REVIEW OF SYSTEMS:    GU Review Female:   Patient reports frequent urination and get up at night to urinate. Patient denies hard to postpone urination, burning /pain with urination, leakage of urine, stream starts and stops, trouble starting your stream, have to strain to urinate, and being pregnant.  Gastrointestinal (Upper):   Patient denies nausea, vomiting, and indigestion/ heartburn.  Gastrointestinal (Lower):   Patient denies diarrhea and constipation.  Constitutional:   Patient denies fever, night sweats, weight loss, and fatigue.  Skin:   Patient denies skin rash/ lesion and itching.  Eyes:   Patient denies blurred vision and double vision.  Ears/ Nose/ Throat:   Patient denies sore throat and sinus problems.  Hematologic/Lymphatic:   Patient denies swollen glands and easy bruising.  Cardiovascular:   Patient denies leg swelling and chest pains.  Respiratory:   Patient denies cough and shortness of breath.  Endocrine:   Patient denies excessive thirst.  Musculoskeletal:   Patient denies back pain and joint pain.  Neurological:   Patient denies headaches and dizziness.  Psychologic:   Patient denies depression and anxiety.   Notes: Left side pain, abdominal pain  VITAL SIGNS:      10/20/2023 03:45 PM  Weight 181 lb / 82.1 kg  Height 66 in / 167.64 cm  BP 148/74 mmHg  Heart Rate 73 /min  Temperature 97.8 F / 36.5 C  BMI 29.2 kg/m   Complexity of Data:  Lab Test Review:   BMP  Records Review:   Previous Hospital Records, Previous Patient Records  Urine Test Review:   Urinalysis  X-Ray Review: [facility] Protocol: Reviewed  Films. Reviewed Report. Discussed With Patient.     PROCEDURES:          Visit Complexity - G2211 Chronic management         Urinalysis w/Scope Dipstick Dipstick Cont'd Micro  Color: Yellow Bilirubin: Neg mg/dL WBC/hpf: NS (Not Seen)  Appearance: Clear Ketones: Neg mg/dL RBC/hpf: 3 - 16/XWR  Specific Gravity: 1.010 Blood: 2+ ery/uL Bacteria: NS (Not Seen)  pH: 6.5 Protein: Trace mg/dL Cystals: NS (Not Seen)  Glucose: Neg mg/dL Urobilinogen: 0.2 mg/dL Casts: NS (Not Seen)    Nitrites: Neg Trichomonas: Not Present    Leukocyte Esterase: Neg leu/uL Mucous: Not Present      Epithelial Cells: NS (Not Seen)      Yeast: NS (Not Seen)      Sperm: Not Present         Ketoralac 60mg  - [id number], [id number] Zero wasted.   Qty: 60 Adm. By: Filiberto Pinks  Unit: mg Lot No [id number]  Route: IM Exp. Date 12/01/2024  Freq: None Mfgr.:   Site: Left Hip   ASSESSMENT:      ICD-10 Details  1 GU:   Ureteral calculus - N20.1 Left, Acute, Systemic Symptoms - She has a 2mm left UVJ stone with pain and a probable fornyceal rupture. She ate about 2 hours before her visit so I gave her toradol and encouraged her to get and take the tamsulosin and back down the fluid intake a bit. if her pain is not well controlled overnight, I encouraged her to stay NPO after MN and call us tomorrow to set up ureteroscopy, otherwise she return in a week for reassessment.   2   Flank Pain - R10.84 Left, Acute, Systemic Symptoms     PLAN:           Schedule Labs: 1 Week - Urinalysis  Return Visit/Planned Activity: 1 Week - Office Visit, Extender  Procedure: 10/20/2023 at [facility] - [phone] - Ketoralac 60mg  (Toradol per 15mg  (60 mg)) - [id number], [id number]          Document Letter(s):  Created for Patient: Clinical Summary         Notes:   CC: Dr. Laurann Montana.         Next Appointment:      Next Appointment: 10/28/2023 08:00 AM    Appointment Type: Office Visit Established Patient    Location:  [facility] [phone]    Provider: Bartholomew Crews, NP    Reason for Visit: 1 week OV

## 2023-10-21 NOTE — Discharge Instructions (Addendum)
You may remove the stent by pulling the string that is tucked vaginally.   Per Harrie Foreman, PA, wait until Thursday.

## 2023-10-21 NOTE — Transfer of Care (Signed)
Immediate Anesthesia Transfer of Care Note  Patient: Danielle Knapp  Procedure(s) Performed: CYSTOSCOPY/RETROGRADE/URETEROSCOPY (Left)  Patient Location: PACU  Anesthesia Type:General  Level of Consciousness: awake, alert , and oriented  Airway & Oxygen Therapy: Patient Spontanous Breathing  Post-op Assessment: Report given to RN  Post vital signs: stable  Last Vitals:  Vitals Value Taken Time  BP 150/79 10/21/23 1630  Temp    Pulse 76 10/21/23 1631  Resp 15 10/21/23 1631  SpO2 99 % 10/21/23 1631  Vitals shown include unfiled device data.  Last Pain:  Vitals:   10/21/23 1349  TempSrc:   PainSc: 5       Patients Stated Pain Goal: 5 (10/21/23 1349)  Complications: No notable events documented.

## 2023-10-21 NOTE — Anesthesia Preprocedure Evaluation (Addendum)
Anesthesia Evaluation  Patient identified by MRN, date of birth, ID band Patient awake    Reviewed: Allergy & Precautions, NPO status , Patient's Chart, lab work & pertinent test results  Airway Mallampati: I  TM Distance: >3 FB Neck ROM: Full    Dental no notable dental hx. (+) Teeth Intact, Dental Advisory Given   Pulmonary sleep apnea    Pulmonary exam normal breath sounds clear to auscultation       Cardiovascular hypertension, Pt. on medications + CAD  Normal cardiovascular exam Rhythm:Regular Rate:Normal  TTE 2019 - LVEF 60-65%, normal wall thickness, normal wall motion, grade 1    DD, indeterminate LV filling pressure, trivial MR, normal    biatrial size, dilated IVC.   Cath 2019  Prox LAD lesion is 10% stenosed.  Mid Cx lesion is 10% stenosed.  Dist RCA lesion is 10% stenosed.  The left ventricular systolic function is normal.  LV end diastolic pressure is normal.  The left ventricular ejection fraction is 55-65% by visual estimate.  There is no aortic valve stenosis.     Neuro/Psych  PSYCHIATRIC DISORDERS Anxiety Depression    negative neurological ROS     GI/Hepatic Neg liver ROS,GERD  Medicated,,  Endo/Other  negative endocrine ROS    Renal/GU negative Renal ROS  negative genitourinary   Musculoskeletal negative musculoskeletal ROS (+)    Abdominal   Peds  Hematology negative hematology ROS (+)   Anesthesia Other Findings   Reproductive/Obstetrics                             Anesthesia Physical Anesthesia Plan  ASA: 3  Anesthesia Plan: General   Post-op Pain Management: Tylenol PO (pre-op)*   Induction: Intravenous  PONV Risk Score and Plan: 3 and Ondansetron, Dexamethasone and Midazolam  Airway Management Planned: LMA  Additional Equipment:   Intra-op Plan:   Post-operative Plan: Extubation in OR  Informed Consent: I have reviewed the patients  History and Physical, chart, labs and discussed the procedure including the risks, benefits and alternatives for the proposed anesthesia with the patient or authorized representative who has indicated his/her understanding and acceptance.     Dental advisory given  Plan Discussed with: CRNA  Anesthesia Plan Comments:        Anesthesia Quick Evaluation

## 2023-10-21 NOTE — Anesthesia Procedure Notes (Signed)
Procedure Name: LMA Insertion Date/Time: 10/21/2023 3:39 PM  Performed by: Micki Riley, CRNAPre-anesthesia Checklist: Emergency Drugs available, Suction available, Patient identified, Timeout performed and Patient being monitored Patient Re-evaluated:Patient Re-evaluated prior to induction Oxygen Delivery Method: Circle system utilized Preoxygenation: Pre-oxygenation with 100% oxygen Induction Type: IV induction LMA: LMA inserted LMA Size: 4.0 Number of attempts: 1

## 2023-10-21 NOTE — Op Note (Signed)
Procedure: 1.  Cystoscopy with left ureteroscopy and stone manipulation. 2.  Cystoscopy with insertion of left double-J stent. 3.  Application of fluoroscopy.  Preop diagnosis: Left distal ureteral stone.  Postop diagnosis: Same.  Surgeon: Dr. Bjorn Pippin.  Anesthesia: General.  Specimen: Stone fragments.  Drains: 6 French by 24 cm left contour double-J stent without tether.  EBL: None.  Complications: None.  Indications: Patient is a 68 year old female who was referred to the office after an ER visit for a 2 mm left distal ureteral stone with obstruction and severe pain.  She has had persistent pain and is elected to undergo ureteroscopy for stone management.  Procedure: She was taken operating room where she was given Ancef.  A general anesthetic was induced.  She was placed in lithotomy position and fitted with PAS hose.  Her perineum and genitalia were prepped with Betadine solution she was draped in usual sterile fashion.  Cystoscopy was performed using the 21 [place] scope and 30 degree lens.  Examination revealed a normal urethra.  The bladder wall was smooth and pale without tumors, stones or inflammation.  The right ureteral orifice was unremarkable.  The left ureteral orifice was slightly mounded up with some bloody efflux.  An attempt was made to pass a 5 [place] open-ended catheter to perform a retrograde pyelogram but the left ureteral anatomy would not admit the ureteral catheter.  The 6.5 French dual-lumen semirigid ureteroscope was then passed per urethra but the ureteral meatus was too stenotic for direct passage of the scope.  I passed a sensor guidewire through the scope into the ureter up to the kidney under fluoroscopic guidance without difficulty.  The inner core of a 28 cm 11/13 [place] digital access sheath was then advanced over the wire to dilate the distal ureter.  There was some resistance at the intramural ureter.  The assembled sheath was then passed to further  dilate the intramural ureter.  I then passed the semirigid ureteroscope without difficulty up the ureteral orifice.  The stone was initially seen and was felt to flush approximately but subsequently fragments were found in the bladder.  After I advanced the semirigid scope to the maximal extent and did not see the stone I remove the ureteroscope and replaced the assembled access sheath over the wire into the proximal ureter and remove the entire wire.  A dual-lumen digital flexible scope was then advanced through the sheath and the remaining proximal ureter, renal pelvis and calyces were inspected and no stones were identified.  It was felt that the stone had been fragmented by the passage of the access sheath and flushed out during the procedure.  The cystoscope was then reinserted over the wire and a 6 [place] by 24 cm contour double-J stent was advanced the kidney under fluoroscopic guidance.  The wire was removed, a good coil in the kidney and a good coil in the bladder.  The bladder was inspected 1 more time and another small fragment was identified and was flushed out of the bladder.  The bladder was drained and the cystoscope was removed.  She was taken down from lithotomy position, her anesthetic was reversed and she was moved recovery room in stable condition.  I was able to retrieve 1 small fragments of the stone to send for analysis.

## 2023-10-22 ENCOUNTER — Encounter (HOSPITAL_COMMUNITY): Payer: Self-pay | Admitting: Urology

## 2023-10-23 NOTE — Anesthesia Postprocedure Evaluation (Signed)
Anesthesia Post Note  Patient: Danielle Knapp  Procedure(s) Performed: CYSTOSCOPY/RETROGRADE/URETEROSCOPY (Left)     Patient location during evaluation: PACU Anesthesia Type: General Level of consciousness: awake and alert Pain management: pain level controlled Vital Signs Assessment: post-procedure vital signs reviewed and stable Respiratory status: spontaneous breathing, nonlabored ventilation, respiratory function stable and patient connected to nasal cannula oxygen Cardiovascular status: blood pressure returned to baseline and stable Postop Assessment: no apparent nausea or vomiting Anesthetic complications: no  No notable events documented.  Last Vitals:  Vitals:   10/21/23 1715 10/21/23 1745  BP: (!) 141/76 136/79  Pulse: 76 69  Resp: 15   Temp:  36.6 C  SpO2: 96% 99%    Last Pain:  Vitals:   10/21/23 1745  TempSrc:   PainSc: 0-No pain   Pain Goal: Patients Stated Pain Goal: 5 (10/21/23 1349)                 Westyn Keatley L Nickol Collister

## 2023-10-28 DIAGNOSIS — N202 Calculus of kidney with calculus of ureter: Secondary | ICD-10-CM | POA: Diagnosis not present

## 2023-10-29 LAB — CALCULI, WITH PHOTOGRAPH (CLINICAL LAB)
Calcium Oxalate Dihydrate: 90 %
Calcium Oxalate Monohydrate: 5 %
Hydroxyapatite: 5 %
Size Calculi: <1 mm
Weight Calculi: <1 mg

## 2023-12-09 DIAGNOSIS — Z1231 Encounter for screening mammogram for malignant neoplasm of breast: Secondary | ICD-10-CM | POA: Diagnosis not present

## 2023-12-09 DIAGNOSIS — N202 Calculus of kidney with calculus of ureter: Secondary | ICD-10-CM | POA: Diagnosis not present

## 2023-12-09 DIAGNOSIS — C672 Malignant neoplasm of lateral wall of bladder: Secondary | ICD-10-CM | POA: Diagnosis not present

## 2023-12-11 ENCOUNTER — Encounter: Payer: Self-pay | Admitting: Adult Health

## 2023-12-21 ENCOUNTER — Encounter: Payer: Self-pay | Admitting: Hematology and Oncology

## 2023-12-24 ENCOUNTER — Inpatient Hospital Stay: Attending: Adult Health | Admitting: Adult Health

## 2023-12-24 ENCOUNTER — Telehealth: Payer: Self-pay | Admitting: *Deleted

## 2023-12-24 ENCOUNTER — Encounter: Payer: Self-pay | Admitting: Adult Health

## 2023-12-24 VITALS — BP 168/73 | HR 68 | Temp 98.1°F | Resp 18 | Ht 65.0 in | Wt 180.7 lb

## 2023-12-24 DIAGNOSIS — L659 Nonscarring hair loss, unspecified: Secondary | ICD-10-CM | POA: Diagnosis not present

## 2023-12-24 DIAGNOSIS — Z8042 Family history of malignant neoplasm of prostate: Secondary | ICD-10-CM | POA: Insufficient documentation

## 2023-12-24 DIAGNOSIS — M8580 Other specified disorders of bone density and structure, unspecified site: Secondary | ICD-10-CM | POA: Diagnosis not present

## 2023-12-24 DIAGNOSIS — Z79811 Long term (current) use of aromatase inhibitors: Secondary | ICD-10-CM | POA: Insufficient documentation

## 2023-12-24 DIAGNOSIS — Z170 Estrogen receptor positive status [ER+]: Secondary | ICD-10-CM | POA: Diagnosis not present

## 2023-12-24 DIAGNOSIS — Z923 Personal history of irradiation: Secondary | ICD-10-CM | POA: Diagnosis not present

## 2023-12-24 DIAGNOSIS — Z8041 Family history of malignant neoplasm of ovary: Secondary | ICD-10-CM | POA: Insufficient documentation

## 2023-12-24 DIAGNOSIS — I10 Essential (primary) hypertension: Secondary | ICD-10-CM | POA: Diagnosis not present

## 2023-12-24 DIAGNOSIS — C541 Malignant neoplasm of endometrium: Secondary | ICD-10-CM | POA: Insufficient documentation

## 2023-12-24 DIAGNOSIS — Z1721 Progesterone receptor positive status: Secondary | ICD-10-CM | POA: Diagnosis not present

## 2023-12-24 DIAGNOSIS — Z803 Family history of malignant neoplasm of breast: Secondary | ICD-10-CM | POA: Diagnosis not present

## 2023-12-24 DIAGNOSIS — D0511 Intraductal carcinoma in situ of right breast: Secondary | ICD-10-CM | POA: Insufficient documentation

## 2023-12-24 MED ORDER — MINOXIDIL 2.5 MG PO TABS: 2.5000 mg | ORAL_TABLET | Freq: Every day | ORAL | 1 refills | Status: DC

## 2023-12-24 NOTE — Progress Notes (Signed)
 [facility] Cancer Follow up:    Danielle Grave, MD [phone] [address] [place] [address]   DIAGNOSIS:  Cancer Staging  Ductal carcinoma in situ (DCIS) of right breast Staging form: Breast, AJCC 8th Edition - Clinical stage from 01/31/2021: Stage 0 (cTis (DCIS), cN0, cM0, ER+, PR+) - Signed by Willy Harvest, MD on 01/31/2021 Stage prefix: Initial diagnosis Nuclear grade: G1    SUMMARY OF ONCOLOGIC HISTORY: Oncology History  Endometrial cancer [facility])   Initial Diagnosis   Endometrial cancer (HCC)   09/27/2019 Imaging   CT A/P: 1. Large hypoenhancing mass in the region of the lower uterus/upper cervix. This appears to expand the endometrial canal the lower uterine segment although the endometrial canal in the uterine fundus is nondilated. As such, endometrial canal/cervical stricture is not considered likely. Rather, imaging features are highly concerning for neoplasm, likely of endometrial origin. 2. No findings of metastatic disease. Specifically, no evidence for lymphadenopathy in the abdomen/pelvis. No peritoneal or omental nodularity. No ascites. 3. Left colonic diverticulosis without diverticulitis   10/09/2020 Imaging   MRI pelvis: Distended endocervical canal, as described above, favoring a lower cervical stenosis/stricture.   Mild associated polypoid soft tissue/debris in the endometrial cavity and along the lower cervix, poorly evaluated on unenhanced MR. While the endometrial soft tissue favors debris, consider hysteroscopy to exclude a cervical mass resulting in Stenosis/stricture.   10/17/2020 Initial Biopsy   EMB: A. ENDOMETRIUM MASS, RESECTION:  - Endometrioid adenocarcinoma.  - See comment.   B. ENDOMETRIUM, CURETTAGE:  - Blood and scanty benign endocervical mucosa.  - No endometrial tissue identified.   COMMENT:   A. The findings are consistent with FIGO grade 1/2.   11/09/2020 Imaging   Cystoscopy and biopsy with  fulguration of papillary lesions (Dr. Annitta Kindler), Robotic-assisted laparoscopic total hysterectomy with bilateral salpingoophorectomy, bilateral pelvic LND, mini-lap for specimen removal  Findings: On EUA, cervix difficult to distinguish, flush with apex; uterus small and mobile. Blood and some tumor noted on D&C. On intra-abdominal entry, normal upper abdominal exam including liver edge, diaphragm, stomach, small and large bowel, omentum. Bilateral ovaries atrophic appearing. Uterus 6-8cm and normal appearing. Mapping unsuccessful to bilateral pelvic basins. Significant retroperitoneal fibrosis noted and significant adhesions between the bladder and cervix. Several mildly prominent pelvic lymph nodes, no frank tumor involvement. No intra-abdominal or pelvic disease noted at the end of surgery.   11/09/2020 Pathology Results   A. BLADDER TUMOR, BIOPSY:  - Low grade papillary urothelial carcinoma.  - No distinct lamina propria invasion identified.  - Muscularis propria is present and not involved.   B. CERVIX, RIGHT POSTERIOR, BIOPSY:  - Fibromuscular tissue devoid of epithelium, negative for carcinoma.   C. CERVIX, LEFT POSTERIOR, BIOPSY:  - Fibromuscular tissue with endocervical glandular type epithelium,  negative for carcinoma.   D. LYMPH NODES, RIGHT PELVIC, EXCISION:  - Five lymph nodes, negative for carcinoma (0/5).   E. LYMPH NODES, LEFT PELVIC, EXCISION:  - Four lymph nodes, negative for carcinoma (0/4).   F. UTERUS, CERVIX AND BILATERAL FALLOPIAN TUBES AND OVARIES, TOTAL  HYSTERECTOMY AND BILATERAL SALPINGO-OOPHORECTOMY:  - Endometrioid carcinoma, FIGO grade 2, with invasion less than half the  myometrium.  - No involvement of uterine serosa, cervical stroma or adnexa.  - See oncology table.   ONCOLOGY TABLE:   UTERUS, CARCINOMA OR CARCINOSARCOMA: Resection   Procedure: Total hysterectomy and bilateral salpingo-oophorectomy,  Excision of pelvic lymph nodes  Histologic Type:  Endometrioid carcinoma  Histologic Grade: FIGO  grade 2  Myometrial Invasion:       Depth of Myometrial Invasion: 2 mm       Myometrial Thickness: 7 mm       Percentage of Myometrial Invasion: < 50%  Uterine Serosa Involvement: Not identified  Cervical Stroma Involvement: Not identified  Other Tissue/Organ Involvement: Not identified  Peritoneal/Ascitic Fluid: Not submitted/unknown  Lymphovascular Invasion: Not identified  Regional Lymph Nodes:       Pelvic Lymph Nodes Examined:            0 Sentinel            9 Non-sentinel            9 Total       Pelvic Lymph Nodes with Metastasis: 0        Para-aortic Lymph Nodes Examined:            0 Sentinel            0 Non-sentinel            0 Total  Distant Metastasis:       Distant Site(s) Involved: Not applicable  Pathologic Stage Classification (pTNM, AJCC 8th Edition): pT1a, pN0  Ancillary Studies: MMR/MSI testing will be ordered  Representative Tumor Block: F4  (v4.2.0.1)    12/24/2020 Genetic Testing   Negative hereditary cancer genetic testing: no pathogenic variants detected in [facility] Common Hereditary Cancers +RNA Panel.  The report date is December 24, 2020.  The Common Hereditary Cancers + RNA Panel offered by [facility] includes sequencing, deletion/duplication, and RNA testing of the following 47 genes: APC, ATM, AXIN2, BARD1, BMPR1A, BRCA1, BRCA2, BRIP1, CDH1, CDK4*, CDKN2A (p14ARF)*, CDKN2A (p16INK4a)*, CHEK2, CTNNA1, DICER1, EPCAM (Deletion/duplication testing only), GREM1 (promoter region deletion/duplication testing only), KIT, MEN1, MLH1, MSH2, MSH3, MSH6, MUTYH, NBN, NF1, NHTL1, PALB2, PDGFRA*, PMS2, POLD1, POLE, PTEN, RAD50, RAD51C, RAD51D, SDHB, SDHC, SDHD, SMAD4, SMARCA4. STK11, TP53, TSC1, TSC2, and VHL.  The following genes were evaluated for sequence changes only: SDHA and HOXB13 c.251G>A variant only.  RNA analysis is not performed for the * genes.     Ductal carcinoma in situ (DCIS) of right breast  01/30/2021 Initial  Diagnosis   Ductal carcinoma in situ (DCIS) of right breast   01/31/2021 Cancer Staging   Staging form: Breast, AJCC 8th Edition - Clinical stage from 01/31/2021: Stage 0 (cTis (DCIS), cN0, cM0, ER+, PR+) - Signed by Willy Harvest, MD on 01/31/2021 Stage prefix: Initial diagnosis Nuclear grade: G1   02/16/2021 Definitive Surgery   FINAL MICROSCOPIC DIAGNOSIS:   A. BREAST, RIGHT, LUMPECTOMY:  - Intermediate and low grade ductal carcinoma in situ with calcifications.  - Atypical lobular hyperplasia.  - Biopsy site.  - Fibrocystic change with calcifications.  - Complex sclerosing lesion.  - Intraductal papilloma.  - See oncology table.    03/29/2021 - 04/25/2021 Radiation Therapy   Site Technique Total Dose (Gy) Dose per Fx (Gy) Completed Fx Beam Energies  Breast, Right: Breast_Rt 3D 42.56/42.56 2.66 16/16 10X, 6XFFF  Breast, Right: Breast_Rt_Bst 3D 8/8 2 4/4 6X, 10X     06/02/2021 -  Anti-estrogen oral therapy   Anastrozole      CURRENT THERAPY: anastrozole   INTERVAL HISTORY:  Discussed the use of AI scribe software for clinical note transcription with the patient, who gave verbal consent to proceed.  Danielle Knapp 68 y.o. female with a history of breast cancer, has been on Anastrozole  since 2022. She has noticed hair thinning since the fall of the  previous year, with large gaps in hair growth. She has been using a salon-grade shampoo and conditioner designed to thicken hair, washing twice a week. She only uses a low heat setting on a blow dryer and has stopped using a flat iron. She is not currently using a heat protective spray. She sleeps on a satin or silk pillowcase to reduce hair breakage. She takes a daily vitamin, D3, and collagen, but has not noticed an improvement in hair growth. She also has a torn rotator cuff and has been experiencing swelling in the shoulder.   Patient Active Problem List   Diagnosis Date Noted   Bladder cancer (HCC) 01/31/2021   Ductal carcinoma in  situ (DCIS) of right breast 01/30/2021   Genetic testing 12/26/2020   Family history of ovarian cancer 12/08/2020   Family history of breast cancer 12/08/2020   Family history of prostate cancer 12/08/2020   Endometrial cancer (HCC)    Hx of adenomatous colonic polyps 02/08/2018   Chest pain 10/15/2017   Hypertensive urgency 10/15/2017   Depression    Hypertension    Polycythemia 08/11/2012    is allergic to ciprofloxacin hcl, erythromycin, metronidazole, tolmetin, ace inhibitors, lisinopril, eszopiclone, and tape.  MEDICAL HISTORY: Past Medical History:  Diagnosis Date   Allergic rhinitis    Allergy    Anxiety    Bladder cancer (HCC) 10/17/2020   Borderline glaucoma, left    no eye drops   BPPV (benign paroxysmal positional vertigo)    Breast cancer (HCC) 01/23/2021   Cancer (HCC) 10/17/2020   Cataract    Chondromalacia of patella    left   Coronary artery disease    cardiac cath 11-03-2017 done for positive ETT,  showed very miminal nonobstrutive cad, ef 55-65%, non caridac chest pain   Diverticulosis of colon    Endometrial cancer (HCC)    Family history of breast cancer 12/08/2020   Family history of ovarian cancer 12/08/2020   Family history of prostate cancer 12/08/2020   Frequency of urination    GERD (gastroesophageal reflux disease)    History of chest pain 10/14/2017   ED visit w/ admission,  hypertensive urgency;  follow up with dr Sharyn Deforest--- had outpatient ETT 10-21-2017 positive ischemia, echo 10-16-2017 G1DD, 60-65%;    s/p cardiac cath 11-03-2017 w/ very miminal nonobstructive cad , chest pain non cardiac   History of COVID-19 06/01/2021   History of diverticulitis    History of kidney stones    HSV-1 infection    fever blisters   Hx of adenomatous colonic polyps 02/08/2018   Hypertension    followed by pcp   MDD (major depressive disorder)    OA (osteoarthritis)    left knee, shoulder   Osteoporosis    Polycythemia    long hx followed by pcp and   sees hematology/ oncology--- dr Melven Stable. Marguerita Shih on as needed basis, chronic and told to donate blood regularly   Rotator cuff tear 02/2023   right   Sleep apnea    pt states minimal sleep apnea that did not require cpap   Stenosis of cervix    Uterine mass    Wears glasses     SURGICAL HISTORY: Past Surgical History:  Procedure Laterality Date   ABDOMINAL HYSTERECTOMY     ACHILLES TENDON SURGERY Left 03/07/2023   Procedure: ACHILLES TENDON REPAIR - SECONDARY;  Surgeon: Anell Baptist, DPM;  Location: [facility];  Service: Orthopedics/Podiatry;  Laterality: Left;   BREAST LUMPECTOMY WITH RADIOACTIVE SEED LOCALIZATION Right 02/16/2021  Procedure: RIGHT BREAST LUMPECTOMY WITH RADIOACTIVE SEED LOCALIZATION;  Surgeon: Oza Blumenthal, MD;  Location: [facility];  Service: General;  Laterality: Right;   CATARACT EXTRACTION W/ INTRAOCULAR LENS  IMPLANT, BILATERAL  2013   COLONOSCOPY  02-02-2018 dr Willy Harvest   CYSTOSCOPY  10/17/2020   Procedure: CYSTOSCOPY;  Surgeon: Greta Leatherwood, MD;  Location: [facility];  Service: Gynecology;;   CYSTOSCOPY N/A 11/09/2020   Procedure: Orin Birk;  Surgeon: Sherlyn Ditto, MD;  Location: [facility];  Service: Urology;  Laterality: N/A;  Dr. Annitta Kindler needs to go first. [name] and rigid bx forceps.   CYSTOSCOPY/RETROGRADE/URETEROSCOPY Left 10/21/2023   Procedure: CYSTOSCOPY/RETROGRADE/URETEROSCOPY;  Surgeon: Homero Luster, MD;  Location: [facility];  Service: Urology;  Laterality: Left;   DIAGNOSTIC LAPAROSCOPY  1996   DILATATION & CURETTAGE/HYSTEROSCOPY WITH MYOSURE N/A 10/17/2020   Procedure: DILATATION & CURETTAGE/HYSTEROSCOPY WITH  MYOSURE RESECTION OF ENDOMETRIAL MASS,  OPENING OF CERVICAL STENOSIS, DRAINAGE OF UTERINE FLUID;  Surgeon: Greta Leatherwood, MD;  Location: [facility];  Service: Gynecology;  Laterality: N/A;   EYE SURGERY     FLEXOR TENOTOMY  Left 03/07/2023   Procedure: 40981 - FLEXOR TENDON REPAIR;   Surgeon: Anell Baptist, DPM;  Location: [facility];  Service: Orthopedics/Podiatry;  Laterality: Left;   FRACTURE SURGERY     HEEL SPUR SURGERY Right 2015   Dr. Aviva Lemmings   HEMORRHOID SURGERY  2004   LEFT HEART CATH AND CORONARY ANGIOGRAPHY N/A 11/03/2017   Procedure: LEFT HEART CATH AND CORONARY ANGIOGRAPHY;  Surgeon: Lucendia Rusk, MD;  Location: [facility];  Service: Cardiovascular;  Laterality: N/A;   LYMPH NODE DISSECTION N/A 11/09/2020   Procedure: BILATERAL LYMPH NODE DISSECTION;MINI LAPAROTOMY;  Surgeon: Suzi Essex, MD;  Location: [facility];  Service: Gynecology;  Laterality: N/A;   OPERATIVE ULTRASOUND N/A 10/17/2020   Procedure: OPERATIVE ULTRASOUND;  Surgeon: Greta Leatherwood, MD;  Location: [facility];  Service: Gynecology;  Laterality: N/A;   OSTECTOMY Left 03/07/2023   Procedure: HAGLUNDS RETROCALCANEAL OSTECTOMY;  Surgeon: Anell Baptist, DPM;  Location: [facility];  Service: Orthopedics/Podiatry;  Laterality: Left;   ROBOTIC ASSISTED TOTAL HYSTERECTOMY WITH BILATERAL SALPINGO OOPHERECTOMY Bilateral 11/09/2020   Procedure: XI ROBOTIC ASSISTED TOTAL HYSTERECTOMY WITH BILATERAL SALPINGO OOPHORECTOMY,;  Surgeon: Suzi Essex, MD;  Location: [facility];  Service: Gynecology;  Laterality: Bilateral;   SEPTOPLASTY  x2 1970's   TONSILLECTOMY  age 58   TRANSURETHRAL RESECTION OF BLADDER TUMOR N/A 11/09/2020   Procedure: TRANSURETHRAL RESECTION OF BLADDER TUMOR (TURBT);  Surgeon: Sherlyn Ditto, MD;  Location: [facility];  Service: Urology;  Laterality: N/A;    SOCIAL HISTORY: Social History   Socioeconomic History   Marital status: Married    Spouse name: Not on file   Number of children: Not on file   Years of education: Not on file   Highest education level: Not on file  Occupational History   Not on file  Tobacco Use   Smoking status: Never   Smokeless tobacco: Never  Vaping Use   Vaping status: Never Used  Substance and Sexual  Activity   Alcohol use: Yes    Alcohol/week: 2.0 standard drinks of alcohol    Types: 2 Glasses of wine per week    Comment: occasional wine   Drug use: Never   Sexual activity: Not Currently    Partners: Male    Birth control/protection: Post-menopausal, None  Other Topics Concern  Not on file  Social History Narrative   Patient is married she has stepchildren no biologic children   She is a Artist at [employer]   2-3 alcoholic beverages per week 1 caffeinated drink daily never smoker no drug use no other tobacco   Social Drivers of Corporate investment banker Strain: Not on file  Food Insecurity: Not on file  Transportation Needs: Not on file  Physical Activity: Not on file  Stress: Not on file  Social Connections: Not on file  Intimate Partner Violence: Not on file    FAMILY HISTORY: Family History  Problem Relation Age of Onset   Osteoporosis Mother    Heart attack Mother    Alzheimer's disease Mother    Arthritis Mother    Depression Mother    Diabetes Father    Hypertension Father    Heart failure Father    Alcohol abuse Father    COPD Father    Ovarian cancer Sister 93   Stroke Maternal Grandmother    Prostate cancer Maternal Grandfather 74       metastatic   Cancer Maternal Grandfather    Breast cancer Other        MGM's sister; dx after 74   Vision loss Paternal Grandmother    Colon cancer Neg Hx    Colon polyps Neg Hx    Esophageal cancer Neg Hx    Rectal cancer Neg Hx    Stomach cancer Neg Hx    Endometrial cancer Neg Hx    Pancreatic cancer Neg Hx     Review of Systems  Constitutional:  Negative for appetite change, chills, fatigue, fever and unexpected weight change.  HENT:   Negative for hearing loss, lump/mass and trouble swallowing.   Eyes:  Negative for eye problems and icterus.  Respiratory:  Negative for chest tightness, cough and shortness of breath.   Cardiovascular:  Negative for chest pain, leg swelling and  palpitations.  Gastrointestinal:  Negative for abdominal distention, abdominal pain, constipation, diarrhea, nausea and vomiting.  Endocrine: Negative for hot flashes.  Genitourinary:  Negative for difficulty urinating.   Musculoskeletal:  Negative for arthralgias.  Skin:  Negative for itching and rash.  Neurological:  Negative for dizziness, extremity weakness, headaches and numbness.  Hematological:  Negative for adenopathy. Does not bruise/bleed easily.  Psychiatric/Behavioral:  Negative for depression. The patient is not nervous/anxious.       PHYSICAL EXAMINATION    Vitals:   12/24/23 0856 12/24/23 0857  BP: (!) 161/72 (!) 168/73  Pulse: 68   Resp: 18   Temp: 98.1 F (36.7 C)   SpO2: 100%     Physical Exam Constitutional:      General: She is not in acute distress.    Appearance: Normal appearance. She is not toxic-appearing.  HENT:     Head: Normocephalic and atraumatic.     Mouth/Throat:     Mouth: Mucous membranes are moist.     Pharynx: Oropharynx is clear. No oropharyngeal exudate or posterior oropharyngeal erythema.  Eyes:     General: No scleral icterus. Cardiovascular:     Rate and Rhythm: Normal rate and regular rhythm.     Pulses: Normal pulses.     Heart sounds: Normal heart sounds.  Pulmonary:     Effort: Pulmonary effort is normal.     Breath sounds: Normal breath sounds.  Abdominal:     General: Abdomen is flat. Bowel sounds are normal. There is no distension.  Palpations: Abdomen is soft.     Tenderness: There is no abdominal tenderness.  Musculoskeletal:        General: No swelling.     Cervical back: Neck supple.  Lymphadenopathy:     Cervical: No cervical adenopathy.  Skin:    General: Skin is warm and dry.     Findings: No rash.  Neurological:     General: No focal deficit present.     Mental Status: She is alert.  Psychiatric:        Mood and Affect: Mood normal.        Behavior: Behavior normal.         ASSESSMENT and  THERAPY PLAN:   Ductal carcinoma in situ (DCIS) of right breast 11/09/2020: TAH with BSO: Endometrioid carcinoma T1 a N0 grade 4 left and 5 right pelvic lymph nodes removed loss of MLH1 and PMS2 nuclear expression 11/09/2020: Low-grade noninvasive papillary urothelial cancer, followed by Dr. Annitta Kindler 12/24/2020: Genetics: No deleterious mutations 01/23/2021: DCIS low-grade ER/PR positive 02/16/2021: Right lumpectomy: DCIS grade 1, negative margins, ER/PR positive 04/25/2021: Completed radiation 11/30/2020: Secondary polycythemia (negative for MPN panel):    Breast cancer surveillance: 1.  Breast exam 07/16/2023: Benign 2. Mammogram 12/09/2023: Benign breast density category B 3.  Bone density 01/24/2023: T-score -2: Osteopenia: Recommended calcium  and vitamin D  and weightbearing exercises  Current treatment: Anastrozole  started 06/02/2021 Anastrozole  toxicities: 1.  Moderate hot flashes improved with taking anastrozole  at bedtime  Breast cancer On anastrozole  since 2022 with no current breast concerns. Occasional soreness likely related to torn rotator cuff, not breast cancer. - Continue with annual mammograms - Follow-up 07/2024 with Dr. Gudena as scheduled  Hair thinning due to anastrozole  Hair thinning likely due to anastrozole . Discussed interventions to reduce hair breakage and stimulate hair growth. Oral minoxidil  prescribed with low risk of significant side effects. - Start oral minoxidil  at night to stimulate hair growth. - Use a heat protective spray when using a blow dryer. - Sleep on a satin or silk pillowcase to reduce hair breakage. - Provided information about oral minoxidil .  Hypertension Blood pressure at 161/72. Oral minoxidil  may slightly lower blood pressure. No significant side effects expected at low dose for hair thinning.  Torn rotator cuff Underwent surgery for torn rotator cuff and bone spur removal almost a year ago. Recent puffiness noted.  Follow-up Behind on  appointments with gynecologist and Dr. Orvil Bland. Concerned about delay in follow-up care. - Send message to schedule an appointment with Dr. Orvil Bland. - Have the GYN scheduling team call to arrange an appointment.   All questions were answered. The patient knows to call the clinic with any problems, questions or concerns. We can certainly see the patient much sooner if necessary.  Total encounter time:30 minutes*in face-to-face visit time, chart review, lab review, care coordination, order entry, and documentation of the encounter time.    Alwin Baars, NP 12/28/23 9:20 PM Medical Oncology and Hematology [facility] [address] [place], [place] [address] Tel. [phone]    Fax. [phone]  *Total Encounter Time as defined by the Centers for Medicare and Medicaid Services includes, in addition to the face-to-face time of a patient visit (documented in the note above) non-face-to-face time: obtaining and reviewing outside history, ordering and reviewing medications, tests or procedures, care coordination (communications with other health care professionals or caregivers) and documentation in the medical record.

## 2023-12-24 NOTE — Patient Instructions (Signed)
 Minoxidil Tablets What is this medication? MINOXIDIL (mi NOX i dill) treats high blood pressure. It works by relaxing blood vessels, which decreases the amount of work the heart has to do. This medicine may be used for other purposes; ask your health care provider or pharmacist if you have questions. COMMON BRAND NAME(S): Loniten What should I tell my care team before I take this medication? They need to know if you have any of these conditions: Angina Heart or blood vessel disease Kidney disease Lung disease Pheochromocytoma Previous heart attack An unusual or allergic reaction to minoxidil, other medications, foods, dyes, or preservatives Pregnant or trying to get pregnant Breast-feeding How should I use this medication? Take this medication by mouth. Take it as directed on the prescription label at the same time every day. You can take it with or without food. If it upsets your stomach, take it with food. Keep taking it unless your care team tells you to stop. Talk to your care team about the use of this medication in children. While it may be prescribed for selected conditions, precautions do apply. Overdosage: If you think you have taken too much of this medicine contact a poison control center or emergency room at once. NOTE: This medicine is only for you. Do not share this medicine with others. What if I miss a dose? If you miss a dose, take it as soon as you can. If it is almost time for your next dose, take only that dose. Do not take double or extra doses. What may interact with this medication? This medication may interact with the following: Medications for chest pain Medications for high blood pressure This list may not describe all possible interactions. Give your health care provider a list of all the medicines, herbs, non-prescription drugs, or dietary supplements you use. Also tell them if you smoke, drink alcohol, or use illegal drugs. Some items may interact with your  medicine. What should I watch for while using this medication? Check your heart rate and blood pressure regularly while you are taking this medication. Ask your care team what your heart rate and blood pressure should be and when you should contact them. While you are taking this medication, keep a check on your weight. Tell your care team if you rapidly gain more than 5 pounds. You may get dizzy. Do not drive, use machinery, or do anything that needs mental alertness until you know how this medication affects you. To reduce the risk of dizzy or fainting spells, do not sit or stand up quickly, especially if you are an older patient. Alcohol can make you more dizzy, and increase flushing and rapid heartbeats. Avoid alcoholic drinks. Your mouth may get dry. Chewing sugarless gum or sucking hard candy, and drinking plenty of water may help. Contact your care team if the problem does not go away or is severe. Do not treat yourself for coughs, colds, or pain while you are taking this medication without asking your care team for advice. Some ingredients may increase your blood pressure. You may need to follow a special low sodium diet while taking this medication. Check with your care team. What side effects may I notice from receiving this medication? Side effects that you should report to your care team as soon as possible: Allergic reactions--skin rash, itching, hives, swelling of the face, lips, tongue, or throat Fast or irregular heartbeat Heart attack--pain or tightness in the chest, shoulders, arms, or jaw, nausea, shortness of breath, cold or clammy  skin, feeling faint or lightheaded Heart failure--shortness of breath, swelling of the ankles, feet, or hands, sudden weight gain, unusual weakness or fatigue Low blood pressure--dizziness, feeling faint or lightheaded, blurry vision Stroke--sudden numbness or weakness of the face, arm, or leg, trouble speaking, confusion, trouble walking, loss of balance  or coordination, dizziness, severe headache, change in vision Worsening chest pain (angina)--pain, pressure, or tightness in the chest, neck, back, or arms Side effects that usually do not require medical attention (report to your care team if they continue or are bothersome): Change in hair color or texture Unexpected hair growth This list may not describe all possible side effects. Call your doctor for medical advice about side effects. You may report side effects to FDA at 1-800-FDA-1088. Where should I keep my medication? Keep out of the reach of children and pets. Store at room temperature between 20 and 25 degrees C (68 and 77 degrees F). Throw away any unused medication after the expiration date. NOTE: This sheet is a summary. It may not cover all possible information. If you have questions about this medicine, talk to your doctor, pharmacist, or health care provider.  2024 [employer] Standard (2021-08-01 00:00:00)

## 2023-12-24 NOTE — Telephone Encounter (Signed)
 Per provider Debbie Fails, NP patient scheduled for appt. Patient aware of date/time

## 2023-12-28 NOTE — Assessment & Plan Note (Signed)
 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 Annitta Kindler 12/24/2020: Genetics: No deleterious mutations 01/23/2021: DCIS low-grade ER/PR positive 02/16/2021: Right lumpectomy: DCIS grade 1, negative margins, ER/PR positive 04/25/2021: Completed radiation 11/30/2020: Secondary polycythemia (negative for MPN panel):    Breast cancer surveillance: 1.  Breast exam 07/16/2023: Benign 2. Mammogram 12/09/2023: Benign breast density category B 3.  Bone density 01/24/2023: T-score -2: Osteopenia: Recommended calcium  and vitamin D  and weightbearing exercises  Current treatment: Anastrozole  started 06/02/2021 Anastrozole  toxicities: 1.  Moderate hot flashes improved with taking anastrozole  at bedtime  Breast cancer On anastrozole  since 2022 with no current breast concerns. Occasional soreness likely related to torn rotator cuff, not breast cancer. - Continue with annual mammograms - Follow-up 07/2024 with Dr. Gudena as scheduled  Hair thinning due to anastrozole  Hair thinning likely due to anastrozole . Discussed interventions to reduce hair breakage and stimulate hair growth. Oral minoxidil  prescribed with low risk of significant side effects. - Start oral minoxidil  at night to stimulate hair growth. - Use a heat protective spray when using a blow dryer. - Sleep on a satin or silk pillowcase to reduce hair breakage. - Provided information about oral minoxidil .  Hypertension Blood pressure at 161/72. Oral minoxidil  may slightly lower blood pressure. No significant side effects expected at low dose for hair thinning.  Torn rotator cuff Underwent surgery for torn rotator cuff and bone spur removal almost a year ago. Recent puffiness noted.  Follow-up Behind on appointments with gynecologist and Dr. Orvil Bland. Concerned about delay  in follow-up care. - Send message to schedule an appointment with Dr. Orvil Bland. - Have the GYN scheduling team call to arrange an appointment.

## 2024-01-09 ENCOUNTER — Inpatient Hospital Stay: Attending: Adult Health | Admitting: Gynecologic Oncology

## 2024-01-09 ENCOUNTER — Encounter: Payer: Self-pay | Admitting: Gynecologic Oncology

## 2024-01-09 VITALS — BP 130/80 | HR 68 | Temp 97.8°F | Resp 16 | Ht 65.0 in | Wt 183.0 lb

## 2024-01-09 DIAGNOSIS — Z79811 Long term (current) use of aromatase inhibitors: Secondary | ICD-10-CM | POA: Diagnosis not present

## 2024-01-09 DIAGNOSIS — Z90722 Acquired absence of ovaries, bilateral: Secondary | ICD-10-CM | POA: Insufficient documentation

## 2024-01-09 DIAGNOSIS — Z90710 Acquired absence of both cervix and uterus: Secondary | ICD-10-CM | POA: Insufficient documentation

## 2024-01-09 DIAGNOSIS — Z9079 Acquired absence of other genital organ(s): Secondary | ICD-10-CM | POA: Insufficient documentation

## 2024-01-09 DIAGNOSIS — C541 Malignant neoplasm of endometrium: Secondary | ICD-10-CM | POA: Diagnosis not present

## 2024-01-09 DIAGNOSIS — Z923 Personal history of irradiation: Secondary | ICD-10-CM | POA: Diagnosis not present

## 2024-01-09 NOTE — Patient Instructions (Signed)
 It was good to see you today.  I do not see or feel any evidence of cancer recurrence on your exam.  We will see you for follow-up in 12 months.  As always, if you develop any new and concerning symptoms before your next visit, please call to see me sooner.

## 2024-01-09 NOTE — Progress Notes (Signed)
 Gynecologic Oncology Return Clinic Visit  01/09/24  Reason for Visit: surveillance visit in the setting of early-stage low-risk endometrial cancer    Treatment History: Oncology History  Endometrial cancer [facility])   Initial Diagnosis   Endometrial cancer (HCC)   09/27/2019 Imaging   CT A/P: 1. Large hypoenhancing mass in the region of the lower uterus/upper cervix. This appears to expand the endometrial canal the lower uterine segment although the endometrial canal in the uterine fundus is nondilated. As such, endometrial canal/cervical stricture is not considered likely. Rather, imaging features are highly concerning for neoplasm, likely of endometrial origin. 2. No findings of metastatic disease. Specifically, no evidence for lymphadenopathy in the abdomen/pelvis. No peritoneal or omental nodularity. No ascites. 3. Left colonic diverticulosis without diverticulitis   10/09/2020 Imaging   MRI pelvis: Distended endocervical canal, as described above, favoring a lower cervical stenosis/stricture.   Mild associated polypoid soft tissue/debris in the endometrial cavity and along the lower cervix, poorly evaluated on unenhanced MR. While the endometrial soft tissue favors debris, consider hysteroscopy to exclude a cervical mass resulting in Stenosis/stricture.   10/17/2020 Initial Biopsy   EMB: A. ENDOMETRIUM MASS, RESECTION:  - Endometrioid adenocarcinoma.  - See comment.   B. ENDOMETRIUM, CURETTAGE:  - Blood and scanty benign endocervical mucosa.  - No endometrial tissue identified.   COMMENT:   A. The findings are consistent with FIGO grade 1/2.   11/09/2020 Imaging   Cystoscopy and biopsy with fulguration of papillary lesions (Dr. Annitta Kindler), Robotic-assisted laparoscopic total hysterectomy with bilateral salpingoophorectomy, bilateral pelvic LND, mini-lap for specimen removal  Findings: On EUA, cervix difficult to distinguish, flush with apex; uterus small and mobile.  Blood and some tumor noted on D&C. On intra-abdominal entry, normal upper abdominal exam including liver edge, diaphragm, stomach, small and large bowel, omentum. Bilateral ovaries atrophic appearing. Uterus 6-8cm and normal appearing. Mapping unsuccessful to bilateral pelvic basins. Significant retroperitoneal fibrosis noted and significant adhesions between the bladder and cervix. Several mildly prominent pelvic lymph nodes, no frank tumor involvement. No intra-abdominal or pelvic disease noted at the end of surgery.   11/09/2020 Pathology Results   A. BLADDER TUMOR, BIOPSY:  - Low grade papillary urothelial carcinoma.  - No distinct lamina propria invasion identified.  - Muscularis propria is present and not involved.   B. CERVIX, RIGHT POSTERIOR, BIOPSY:  - Fibromuscular tissue devoid of epithelium, negative for carcinoma.   C. CERVIX, LEFT POSTERIOR, BIOPSY:  - Fibromuscular tissue with endocervical glandular type epithelium,  negative for carcinoma.   D. LYMPH NODES, RIGHT PELVIC, EXCISION:  - Five lymph nodes, negative for carcinoma (0/5).   E. LYMPH NODES, LEFT PELVIC, EXCISION:  - Four lymph nodes, negative for carcinoma (0/4).   F. UTERUS, CERVIX AND BILATERAL FALLOPIAN TUBES AND OVARIES, TOTAL  HYSTERECTOMY AND BILATERAL SALPINGO-OOPHORECTOMY:  - Endometrioid carcinoma, FIGO grade 2, with invasion less than half the  myometrium.  - No involvement of uterine serosa, cervical stroma or adnexa.  - See oncology table.   ONCOLOGY TABLE:   UTERUS, CARCINOMA OR CARCINOSARCOMA: Resection   Procedure: Total hysterectomy and bilateral salpingo-oophorectomy,  Excision of pelvic lymph nodes  Histologic Type: Endometrioid carcinoma  Histologic Grade: FIGO grade 2  Myometrial Invasion:       Depth of Myometrial Invasion: 2 mm       Myometrial Thickness: 7 mm       Percentage of Myometrial Invasion: < 50%  Uterine Serosa Involvement: Not identified  Cervical Stroma Involvement:  Not identified  Other  Tissue/Organ Involvement: Not identified  Peritoneal/Ascitic Fluid: Not submitted/unknown  Lymphovascular Invasion: Not identified  Regional Lymph Nodes:       Pelvic Lymph Nodes Examined:            0 Sentinel            9 Non-sentinel            9 Total       Pelvic Lymph Nodes with Metastasis: 0        Para-aortic Lymph Nodes Examined:            0 Sentinel            0 Non-sentinel            0 Total  Distant Metastasis:       Distant Site(s) Involved: Not applicable  Pathologic Stage Classification (pTNM, AJCC 8th Edition): pT1a, pN0  Ancillary Studies: MMR/MSI testing will be ordered  Representative Tumor Block: F4  (v4.2.0.1)    12/24/2020 Genetic Testing   Negative hereditary cancer genetic testing: no pathogenic variants detected in [facility] Common Hereditary Cancers +RNA Panel.  The report date is December 24, 2020.  The Common Hereditary Cancers + RNA Panel offered by [facility] includes sequencing, deletion/duplication, and RNA testing of the following 47 genes: APC, ATM, AXIN2, BARD1, BMPR1A, BRCA1, BRCA2, BRIP1, CDH1, CDK4*, CDKN2A (p14ARF)*, CDKN2A (p16INK4a)*, CHEK2, CTNNA1, DICER1, EPCAM (Deletion/duplication testing only), GREM1 (promoter region deletion/duplication testing only), KIT, MEN1, MLH1, MSH2, MSH3, MSH6, MUTYH, NBN, NF1, NHTL1, PALB2, PDGFRA*, PMS2, POLD1, POLE, PTEN, RAD50, RAD51C, RAD51D, SDHB, SDHC, SDHD, SMAD4, SMARCA4. STK11, TP53, TSC1, TSC2, and VHL.  The following genes were evaluated for sequence changes only: SDHA and HOXB13 c.251G>A variant only.  RNA analysis is not performed for the * genes.     Ductal carcinoma in situ (DCIS) of right breast  01/30/2021 Initial Diagnosis   Ductal carcinoma in situ (DCIS) of right breast   01/31/2021 Cancer Staging   Staging form: Breast, AJCC 8th Edition - Clinical stage from 01/31/2021: Stage 0 (cTis (DCIS), cN0, cM0, ER+, PR+) - Signed by Willy Harvest, MD on 01/31/2021 Stage prefix: Initial  diagnosis Nuclear grade: G1   02/16/2021 Definitive Surgery   FINAL MICROSCOPIC DIAGNOSIS:   A. BREAST, RIGHT, LUMPECTOMY:  - Intermediate and low grade ductal carcinoma in situ with calcifications.  - Atypical lobular hyperplasia.  - Biopsy site.  - Fibrocystic change with calcifications.  - Complex sclerosing lesion.  - Intraductal papilloma.  - See oncology table.    03/29/2021 - 04/25/2021 Radiation Therapy   Site Technique Total Dose (Gy) Dose per Fx (Gy) Completed Fx Beam Energies  Breast, Right: Breast_Rt 3D 42.56/42.56 2.66 16/16 10X, 6XFFF  Breast, Right: Breast_Rt_Bst 3D 8/8 2 4/4 6X, 10X     06/02/2021 -  Anti-estrogen oral therapy   Anastrozole      Interval History: Doing well.  Denies any vaginal bleeding or discharge.  Reports baseline bowel bladder function.  Past Medical/Surgical History: Past Medical History:  Diagnosis Date   Allergic rhinitis    Allergy    Anxiety    Bladder cancer (HCC) 10/17/2020   Borderline glaucoma, left    no eye drops   BPPV (benign paroxysmal positional vertigo)    Breast cancer (HCC) 01/23/2021   Cancer (HCC) 10/17/2020   Cataract    Chondromalacia of patella    left   Coronary artery disease    cardiac cath 11-03-2017 done for positive ETT,  showed very miminal nonobstrutive  cad, ef 55-65%, non caridac chest pain   Diverticulosis of colon    Endometrial cancer (HCC)    Family history of breast cancer 12/08/2020   Family history of ovarian cancer 12/08/2020   Family history of prostate cancer 12/08/2020   Frequency of urination    GERD (gastroesophageal reflux disease)    History of chest pain 10/14/2017   ED visit w/ admission,  hypertensive urgency;  follow up with dr Sharyn Deforest--- had outpatient ETT 10-21-2017 positive ischemia, echo 10-16-2017 G1DD, 60-65%;    s/p cardiac cath 11-03-2017 w/ very miminal nonobstructive cad , chest pain non cardiac   History of COVID-19 06/01/2021   History of diverticulitis    History  of kidney stones    HSV-1 infection    fever blisters   Hx of adenomatous colonic polyps 02/08/2018   Hypertension    followed by pcp   MDD (major depressive disorder)    OA (osteoarthritis)    left knee, shoulder   Osteoporosis    Polycythemia    long hx followed by pcp and  sees hematology/ oncology--- dr Melven Stable. Marguerita Shih on as needed basis, chronic and told to donate blood regularly   Rotator cuff tear 02/2023   right   Sleep apnea    pt states minimal sleep apnea that did not require cpap   Stenosis of cervix    Uterine mass    Wears glasses     Past Surgical History:  Procedure Laterality Date   ABDOMINAL HYSTERECTOMY     ACHILLES TENDON SURGERY Left 03/07/2023   Procedure: ACHILLES TENDON REPAIR - SECONDARY;  Surgeon: Anell Baptist, DPM;  Location: [facility];  Service: Orthopedics/Podiatry;  Laterality: Left;   BREAST LUMPECTOMY WITH RADIOACTIVE SEED LOCALIZATION Right 02/16/2021   Procedure: RIGHT BREAST LUMPECTOMY WITH RADIOACTIVE SEED LOCALIZATION;  Surgeon: Oza Blumenthal, MD;  Location: [facility];  Service: General;  Laterality: Right;   CATARACT EXTRACTION W/ INTRAOCULAR LENS  IMPLANT, BILATERAL  2013   COLONOSCOPY  02-02-2018 dr Willy Harvest   CYSTOSCOPY  10/17/2020   Procedure: CYSTOSCOPY;  Surgeon: Greta Leatherwood, MD;  Location: [facility];  Service: Gynecology;;   CYSTOSCOPY N/A 11/09/2020   Procedure: Orin Birk;  Surgeon: Sherlyn Ditto, MD;  Location: [facility];  Service: Urology;  Laterality: N/A;  Dr. Annitta Kindler needs to go first. [name] and rigid bx forceps.   CYSTOSCOPY/RETROGRADE/URETEROSCOPY Left 10/21/2023   Procedure: CYSTOSCOPY/RETROGRADE/URETEROSCOPY;  Surgeon: Homero Luster, MD;  Location: [facility];  Service: Urology;  Laterality: Left;   DIAGNOSTIC LAPAROSCOPY  1996   DILATATION & CURETTAGE/HYSTEROSCOPY WITH MYOSURE N/A 10/17/2020   Procedure: DILATATION & CURETTAGE/HYSTEROSCOPY WITH  MYOSURE RESECTION OF ENDOMETRIAL MASS,   OPENING OF CERVICAL STENOSIS, DRAINAGE OF UTERINE FLUID;  Surgeon: Greta Leatherwood, MD;  Location: [facility];  Service: Gynecology;  Laterality: N/A;   EYE SURGERY     FLEXOR TENOTOMY  Left 03/07/2023   Procedure: 52841 - FLEXOR TENDON REPAIR;  Surgeon: Anell Baptist, DPM;  Location: [facility];  Service: Orthopedics/Podiatry;  Laterality: Left;   FRACTURE SURGERY     HEEL SPUR SURGERY Right 2015   Dr. Aviva Lemmings   HEMORRHOID SURGERY  2004   LEFT HEART CATH AND CORONARY ANGIOGRAPHY N/A 11/03/2017   Procedure: LEFT HEART CATH AND CORONARY ANGIOGRAPHY;  Surgeon: Lucendia Rusk, MD;  Location: [facility];  Service: Cardiovascular;  Laterality: N/A;   LYMPH NODE DISSECTION N/A 11/09/2020   Procedure: BILATERAL LYMPH NODE  DISSECTION;MINI LAPAROTOMY;  Surgeon: Suzi Essex, MD;  Location: [facility];  Service: Gynecology;  Laterality: N/A;   OPERATIVE ULTRASOUND N/A 10/17/2020   Procedure: OPERATIVE ULTRASOUND;  Surgeon: Greta Leatherwood, MD;  Location: [facility];  Service: Gynecology;  Laterality: N/A;   OSTECTOMY Left 03/07/2023   Procedure: HAGLUNDS RETROCALCANEAL OSTECTOMY;  Surgeon: Anell Baptist, DPM;  Location: [facility];  Service: Orthopedics/Podiatry;  Laterality: Left;   ROBOTIC ASSISTED TOTAL HYSTERECTOMY WITH BILATERAL SALPINGO OOPHERECTOMY Bilateral 11/09/2020   Procedure: XI ROBOTIC ASSISTED TOTAL HYSTERECTOMY WITH BILATERAL SALPINGO OOPHORECTOMY,;  Surgeon: Suzi Essex, MD;  Location: [facility];  Service: Gynecology;  Laterality: Bilateral;   SEPTOPLASTY  x2 1970's   TONSILLECTOMY  age 35   TRANSURETHRAL RESECTION OF BLADDER TUMOR N/A 11/09/2020   Procedure: TRANSURETHRAL RESECTION OF BLADDER TUMOR (TURBT);  Surgeon: Sherlyn Ditto, MD;  Location: [facility];  Service: Urology;  Laterality: N/A;    Family History  Problem Relation Age of Onset   Osteoporosis Mother    Heart attack Mother    Alzheimer's disease Mother     Arthritis Mother    Depression Mother    Diabetes Father    Hypertension Father    Heart failure Father    Alcohol abuse Father    COPD Father    Ovarian cancer Sister 79   Stroke Maternal Grandmother    Prostate cancer Maternal Grandfather 52       metastatic   Cancer Maternal Grandfather    Breast cancer Other        MGM's sister; dx after 15   Vision loss Paternal Grandmother    Colon cancer Neg Hx    Colon polyps Neg Hx    Esophageal cancer Neg Hx    Rectal cancer Neg Hx    Stomach cancer Neg Hx    Endometrial cancer Neg Hx    Pancreatic cancer Neg Hx     Social History   Socioeconomic History   Marital status: Married    Spouse name: Not on file   Number of children: Not on file   Years of education: Not on file   Highest education level: Not on file  Occupational History   Not on file  Tobacco Use   Smoking status: Never   Smokeless tobacco: Never  Vaping Use   Vaping status: Never Used  Substance and Sexual Activity   Alcohol use: Yes    Alcohol/week: 2.0 standard drinks of alcohol    Types: 2 Glasses of wine per week    Comment: occasional wine   Drug use: Never   Sexual activity: Not Currently    Partners: Male    Birth control/protection: Post-menopausal, None  Other Topics Concern   Not on file  Social History Narrative   Patient is married she has stepchildren no biologic children   She is a Artist at [employer]   2-3 alcoholic beverages per week 1 caffeinated drink daily never smoker no drug use no other tobacco   Social Drivers of Corporate investment banker Strain: Not on file  Food Insecurity: Not on file  Transportation Needs: Not on file  Physical Activity: Not on file  Stress: Not on file  Social Connections: Not on file    Current Medications:  Current Outpatient Medications:    amLODipine  (NORVASC ) 5 MG tablet, Take 5 mg by mouth every morning., Disp: , Rfl:    anastrozole  (ARIMIDEX ) 1 MG tablet, TAKE ONE  TABLET BY MOUTH ONE TIME DAILY, Disp: 90 tablet, Rfl: 3   buPROPion  (WELLBUTRIN  XL) 300 MG 24 hr tablet, Take 300 mg by mouth every morning., Disp: , Rfl:    Calcium  Carb-Cholecalciferol  (CALCIUM  500+D3 PO), Take 1 tablet by mouth every evening., Disp: , Rfl:    Cholecalciferol  (VITAMIN D3) 1.25 MG (50000 UT) CAPS, Take 1 capsule by mouth daily., Disp: , Rfl:    COLLAGEN PO, Take 2 tablets by mouth 2 (two) times daily., Disp: , Rfl:    fluticasone (FLONASE) 50 MCG/ACT nasal spray, Place 2 sprays into both nostrils every morning., Disp: , Rfl:    losartan  (COZAAR ) 100 MG tablet, Take 100 mg by mouth every morning., Disp: , Rfl:    minoxidil  (LONITEN ) 2.5 MG tablet, Take 1 tablet (2.5 mg total) by mouth daily., Disp: 30 tablet, Rfl: 1   Multiple Vitamin (MULTIVITAMIN WITH MINERALS) TABS tablet, Take 1 tablet by mouth every evening., Disp: , Rfl:    Multiple Vitamins-Minerals (HAIR SKIN AND NAILS FORMULA PO), Take 2 tablets by mouth daily at 6 (six) AM. GUMMIES, Disp: , Rfl:    pantoprazole (PROTONIX) 40 MG tablet, Take 40 mg by mouth daily., Disp: , Rfl:    sertraline  (ZOLOFT ) 100 MG tablet, Take 200 mg by mouth every morning., Disp: , Rfl:    valACYclovir  (VALTREX ) 1000 MG tablet, Take 2 tablets (2,000 mg total) by mouth 2 (two) times daily as needed (for fever blisters)., Disp: 30 tablet, Rfl: 2   zolpidem  (AMBIEN ) 10 MG tablet, 5 mg at bedtime., Disp: , Rfl:   Review of Systems: Denies appetite changes, fevers, chills, fatigue, unexplained weight changes. Denies hearing loss, neck lumps or masses, mouth sores, ringing in ears or voice changes. Denies cough or wheezing.  Denies shortness of breath. Denies chest pain or palpitations. Denies leg swelling. Denies abdominal distention, pain, blood in stools, constipation, diarrhea, nausea, vomiting, or early satiety. Denies pain with intercourse, dysuria, frequency, hematuria or incontinence. Denies hot flashes, pelvic pain, vaginal bleeding or  vaginal discharge.   Denies joint pain, back pain or muscle pain/cramps. Denies itching, rash, or wounds. Denies dizziness, headaches, numbness or seizures. Denies swollen lymph nodes or glands, denies easy bruising or bleeding. Denies anxiety, depression, confusion, or decreased concentration.  Physical Exam: BP 130/80 (BP Location: Left Arm, Patient Position: Sitting)   Pulse 68   Temp 97.8 F (36.6 C) (Oral)   Resp 16   Ht 5\' 5"  (1.651 m)   Wt 183 lb (83 kg)   LMP 09/03/2011   SpO2 99%   BMI 30.45 kg/m  General: Alert, oriented, no acute distress. HEENT: Normocephalic, atraumatic, sclera anicteric. Chest: Unlabored breathing on room air.  Lungs clear to auscultation bilaterally.  No wheezes or rhonchi. Cardiovascular: Regular rate and rhythm, no murmurs or rubs appreciated. Abdomen: soft, nontender.  Normoactive bowel sounds.  No masses or hepatosplenomegaly appreciated.  Well-healed incisions. Extremities: Grossly normal range of motion.  Warm, well perfused.  No edema bilaterally. Skin: No rashes or lesions noted. Lymphatics: No cervical, supraclavicular, or inguinal adenopathy. GU: Normal appearing external genitalia without erythema, excoriation, or lesions.  Speculum exam reveals no bleeding or erythema. No masses or lesions.  Bimanual exam reveals cuff smooth, no masses.  Rectovaginal exam confirms these findings, no nodularity.   Laboratory & Radiologic Studies: None new  Assessment & Plan: Danielle Knapp is a 68 y.o. woman with Stage IA2 grade 2 endometrioid endometrial adenocarcinoma who presents for surveillance visit.  Surgery in 10/2020.  Vaginal cuff biopsy performed in September 2022 consistent with granulation tissue.   Doing well. NED on exam.    Per NCCN surveillance recommendations, we will continue with visits every 6 months for 5 years.  Given low risk disease, we will alternate these between my office and her OB/GYN.  She sees Dr. Colvin Dec at the end of the  year.  We will see her back in 1 year. We reviewed signs and symptoms that would be concerning for disease recurrence and the patient knows to call to see me if she develops any of these symptoms between scheduled visits.    Surveillance pap or HPV testing is no longer indicated.  20 minutes of total time was spent for this patient encounter, including preparation, face-to-face counseling with the patient and coordination of care, and documentation of the encounter.  Wiley Hanger, MD  Division of Gynecologic Oncology  Department of Obstetrics and Gynecology  [facility]

## 2024-01-12 DIAGNOSIS — M7672 Peroneal tendinitis, left leg: Secondary | ICD-10-CM | POA: Diagnosis not present

## 2024-01-12 DIAGNOSIS — M79672 Pain in left foot: Secondary | ICD-10-CM | POA: Diagnosis not present

## 2024-01-12 DIAGNOSIS — M722 Plantar fascial fibromatosis: Secondary | ICD-10-CM | POA: Diagnosis not present

## 2024-01-12 DIAGNOSIS — M79671 Pain in right foot: Secondary | ICD-10-CM | POA: Diagnosis not present

## 2024-01-27 DIAGNOSIS — C50911 Malignant neoplasm of unspecified site of right female breast: Secondary | ICD-10-CM | POA: Diagnosis not present

## 2024-01-27 DIAGNOSIS — M8588 Other specified disorders of bone density and structure, other site: Secondary | ICD-10-CM | POA: Diagnosis not present

## 2024-01-27 DIAGNOSIS — Z8551 Personal history of malignant neoplasm of bladder: Secondary | ICD-10-CM | POA: Diagnosis not present

## 2024-01-27 DIAGNOSIS — D45 Polycythemia vera: Secondary | ICD-10-CM | POA: Diagnosis not present

## 2024-01-27 DIAGNOSIS — M75101 Unspecified rotator cuff tear or rupture of right shoulder, not specified as traumatic: Secondary | ICD-10-CM | POA: Diagnosis not present

## 2024-01-27 DIAGNOSIS — F331 Major depressive disorder, recurrent, moderate: Secondary | ICD-10-CM | POA: Diagnosis not present

## 2024-01-27 DIAGNOSIS — Z8542 Personal history of malignant neoplasm of other parts of uterus: Secondary | ICD-10-CM | POA: Diagnosis not present

## 2024-01-27 DIAGNOSIS — Z79899 Other long term (current) drug therapy: Secondary | ICD-10-CM | POA: Diagnosis not present

## 2024-01-27 DIAGNOSIS — I10 Essential (primary) hypertension: Secondary | ICD-10-CM | POA: Diagnosis not present

## 2024-01-27 DIAGNOSIS — K219 Gastro-esophageal reflux disease without esophagitis: Secondary | ICD-10-CM | POA: Diagnosis not present

## 2024-01-27 DIAGNOSIS — Z0000 Encounter for general adult medical examination without abnormal findings: Secondary | ICD-10-CM | POA: Diagnosis not present

## 2024-01-27 DIAGNOSIS — F411 Generalized anxiety disorder: Secondary | ICD-10-CM | POA: Diagnosis not present

## 2024-02-02 DIAGNOSIS — I10 Essential (primary) hypertension: Secondary | ICD-10-CM | POA: Diagnosis not present

## 2024-02-22 ENCOUNTER — Other Ambulatory Visit: Payer: Self-pay | Admitting: Adult Health

## 2024-02-22 DIAGNOSIS — D0511 Intraductal carcinoma in situ of right breast: Secondary | ICD-10-CM

## 2024-02-22 DIAGNOSIS — L659 Nonscarring hair loss, unspecified: Secondary | ICD-10-CM

## 2024-02-25 DIAGNOSIS — M25511 Pain in right shoulder: Secondary | ICD-10-CM | POA: Diagnosis not present

## 2024-02-27 DIAGNOSIS — H43813 Vitreous degeneration, bilateral: Secondary | ICD-10-CM | POA: Diagnosis not present

## 2024-02-27 DIAGNOSIS — H40003 Preglaucoma, unspecified, bilateral: Secondary | ICD-10-CM | POA: Diagnosis not present

## 2024-02-27 DIAGNOSIS — Z961 Presence of intraocular lens: Secondary | ICD-10-CM | POA: Diagnosis not present

## 2024-03-01 DIAGNOSIS — M75121 Complete rotator cuff tear or rupture of right shoulder, not specified as traumatic: Secondary | ICD-10-CM | POA: Diagnosis not present

## 2024-03-01 DIAGNOSIS — I10 Essential (primary) hypertension: Secondary | ICD-10-CM | POA: Diagnosis not present

## 2024-03-01 DIAGNOSIS — M7521 Bicipital tendinitis, right shoulder: Secondary | ICD-10-CM | POA: Diagnosis not present

## 2024-03-01 DIAGNOSIS — M7581 Other shoulder lesions, right shoulder: Secondary | ICD-10-CM | POA: Diagnosis not present

## 2024-03-01 DIAGNOSIS — E663 Overweight: Secondary | ICD-10-CM | POA: Diagnosis not present

## 2024-03-01 DIAGNOSIS — Z8542 Personal history of malignant neoplasm of other parts of uterus: Secondary | ICD-10-CM | POA: Diagnosis not present

## 2024-03-01 DIAGNOSIS — F411 Generalized anxiety disorder: Secondary | ICD-10-CM | POA: Diagnosis not present

## 2024-03-01 DIAGNOSIS — Z853 Personal history of malignant neoplasm of breast: Secondary | ICD-10-CM | POA: Diagnosis not present

## 2024-03-02 DIAGNOSIS — I10 Essential (primary) hypertension: Secondary | ICD-10-CM | POA: Diagnosis not present

## 2024-03-10 ENCOUNTER — Encounter: Admitting: Obstetrics and Gynecology

## 2024-03-20 DIAGNOSIS — M25511 Pain in right shoulder: Secondary | ICD-10-CM | POA: Diagnosis not present

## 2024-03-29 DIAGNOSIS — M25511 Pain in right shoulder: Secondary | ICD-10-CM | POA: Diagnosis not present

## 2024-04-01 DIAGNOSIS — I10 Essential (primary) hypertension: Secondary | ICD-10-CM | POA: Diagnosis not present

## 2024-04-01 DIAGNOSIS — E663 Overweight: Secondary | ICD-10-CM | POA: Diagnosis not present

## 2024-04-01 DIAGNOSIS — Z853 Personal history of malignant neoplasm of breast: Secondary | ICD-10-CM | POA: Diagnosis not present

## 2024-04-01 DIAGNOSIS — F411 Generalized anxiety disorder: Secondary | ICD-10-CM | POA: Diagnosis not present

## 2024-04-01 DIAGNOSIS — Z8542 Personal history of malignant neoplasm of other parts of uterus: Secondary | ICD-10-CM | POA: Diagnosis not present

## 2024-05-01 DIAGNOSIS — I10 Essential (primary) hypertension: Secondary | ICD-10-CM | POA: Diagnosis not present

## 2024-05-02 DIAGNOSIS — E663 Overweight: Secondary | ICD-10-CM | POA: Diagnosis not present

## 2024-05-02 DIAGNOSIS — I10 Essential (primary) hypertension: Secondary | ICD-10-CM | POA: Diagnosis not present

## 2024-05-02 DIAGNOSIS — Z853 Personal history of malignant neoplasm of breast: Secondary | ICD-10-CM | POA: Diagnosis not present

## 2024-05-02 DIAGNOSIS — F411 Generalized anxiety disorder: Secondary | ICD-10-CM | POA: Diagnosis not present

## 2024-05-02 DIAGNOSIS — Z8542 Personal history of malignant neoplasm of other parts of uterus: Secondary | ICD-10-CM | POA: Diagnosis not present

## 2024-05-05 ENCOUNTER — Other Ambulatory Visit: Payer: Self-pay | Admitting: Surgery

## 2024-05-10 DIAGNOSIS — M7581 Other shoulder lesions, right shoulder: Secondary | ICD-10-CM | POA: Diagnosis not present

## 2024-05-10 DIAGNOSIS — M7521 Bicipital tendinitis, right shoulder: Secondary | ICD-10-CM | POA: Diagnosis not present

## 2024-05-10 DIAGNOSIS — M25819 Other specified joint disorders, unspecified shoulder: Secondary | ICD-10-CM | POA: Diagnosis not present

## 2024-05-10 DIAGNOSIS — M19011 Primary osteoarthritis, right shoulder: Secondary | ICD-10-CM | POA: Diagnosis not present

## 2024-05-10 DIAGNOSIS — M75121 Complete rotator cuff tear or rupture of right shoulder, not specified as traumatic: Secondary | ICD-10-CM | POA: Diagnosis not present

## 2024-05-10 NOTE — Progress Notes (Signed)
 HPI:  Danielle Knapp is a 68 y.o. female who presents today for her surgical history and physical for upcoming right shoulder arthroscopy with debridement, decompression, mini open rotator cuff repair and mini open biceps tenodesis.  Surgery scheduled with Dr. Edie on 05/18/2024.  The patient denies any changes in her medical history since her last evaluation.  She denies any trauma or injury affecting the right shoulder since her last appointment.  He does report increased discomfort especially with certain weight that she moves her arm since her shoulder department urgent at this time.  She reports a 2 out of 10 pain score.  She does have a history of abnormal heart attack in 2019, further workup was benign.  No history of stroke, asthma, COPD, DVT or diabetes.  Current Outpatient Medications  Medication Sig Dispense Refill  . amLODIPine  (NORVASC ) 5 MG tablet Take by mouth    . anastrozole  (ARIMIDEX ) 1 mg tablet TAKE ONE TABLET BY MOUTH ONE TIME DAILY START 06/02/2021    . buPROPion  (WELLBUTRIN  XL) 300 MG XL tablet Take 300 mg by mouth once daily.    . CALCIUM  CARBONATE (CALCIUM  600 ORAL) Take 1 tablet by mouth once daily    . calcium  carbonate-vitamin D3 (CALTRATE 600+D) 600 mg(1,500mg ) -200 unit tablet Take by mouth    . cholecalciferol  (VITAMIN D3) 1,250 mcg (50,000 unit) capsule Take by mouth    . fluticasone propionate (FLONASE) 50 mcg/actuation nasal spray SPRAY 2 SPRAYS TWICE DAILY IN EACH NOSTRIL EVERY DAY    . loratadine (CLARITIN) 10 mg tablet Take 1 tablet by mouth once daily    . losartan  (COZAAR ) 25 MG tablet Take 12.5 mg by mouth once daily.    . meloxicam (MOBIC) 15 MG tablet Take 15 mg by mouth once daily    . metoprolol  tartrate (LOPRESSOR ) 25 MG tablet   0  . minoxidiL  2.5 MG tablet Take 2.5 mg by mouth once daily    . multivitamin with minerals tablet Take 1 tablet by mouth every evening    . omeprazole (PRILOSEC) 40 MG DR capsule Take 40 mg by mouth once daily    . ondansetron   (ZOFRAN ) 8 MG tablet TAKE ONE TABLET BY MOUTH EVERY 8 HOURS AS NEEDED FOR 4 DAYS    . pantoprazole (PROTONIX) 40 MG DR tablet Take 40 mg by mouth once daily    . sertraline  (ZOLOFT ) 100 MG tablet Take 150 mg by mouth once daily.    . tamsulosin (FLOMAX) 0.4 mg capsule     . valACYclovir  (VALTREX ) 1000 MG tablet Take by mouth    . valsartan (DIOVAN) 320 MG tablet 1 tablet Orally Once a day; Duration: 90 days    . zolpidem  (AMBIEN ) 10 mg tablet Take 10 mg by mouth nightly as needed for Sleep.     No current facility-administered medications for this visit.   Allergies  Allergen Reactions  . Nsaids (Non-Steroidal Anti-Inflammatory Drug) Cough    Can tolerate Ibuprofen .   . Tolmetin Cough    Can tolerate Ibuprofen .     . Ace Inhibitors Cough and Other (See Comments)  . Erythromycin Diarrhea  . Eszopiclone Other (See Comments)  . Ciprofloxacin Hcl Rash  . Metronidazole Rash   Past Medical History:  Diagnosis Date  . Anxiety   . Arthritis   . Depression   . History of cancer   . HTN (hypertension)    Past Surgical History:  Procedure Laterality Date  . CATARACT EXTRACTION    . HEEL SURGERY    .  MASTECTOMY PARTIAL / LUMPECTOMY N/A   . SEPTOPLASTY    . TONSILLECTOMY      Family History  Problem Relation Name Age of Onset  . High blood pressure (Hypertension) Mother    . Alzheimer's disease Mother    . COPD Father    . Diabetes type II Father    . Ovarian cancer Sister      Social History   Socioeconomic History  . Marital status: Married  Tobacco Use  . Smoking status: Never  . Smokeless tobacco: Never  Vaping Use  . Vaping status: Never Used  Substance and Sexual Activity  . Alcohol use: Yes    Alcohol/week: 0.0 standard drinks of alcohol  . Drug use: Never  . Sexual activity: Defer   Social Drivers of Health   Financial Resource Strain: Low Risk  (05/10/2024)   Overall Financial Resource Strain (CARDIA)   . Difficulty of Paying Living Expenses: Not hard at  all  Food Insecurity: No Food Insecurity (05/10/2024)   Hunger Vital Sign   . Worried About Programme researcher, broadcasting/film/video in the Last Year: Never true   . Ran Out of Food in the Last Year: Never true  Transportation Needs: No Transportation Needs (05/10/2024)   PRAPARE - Transportation   . Lack of Transportation (Medical): No   . Lack of Transportation (Non-Medical): No    Review of Systems:  A comprehensive 14 point ROS was performed, reviewed, and the pertinent orthopaedic findings are documented in the HPI.  Exam: Vitals:   05/10/24 0824  BP: 130/70  Weight: 83.7 kg (184 lb 9.6 oz)  Height: 168.9 cm (5' 6.5)  PainSc:   2  PainLoc: Shoulder   General/Constitutional: The patient appears to be well-nourished, well-developed, and in no acute distress. Neuro/Psych: Normal mood and affect, oriented to person, place and time. Eyes: Non-icteric.  Pupils are equal, round, and reactive to light, and exhibit synchronous movement. ENT: Unremarkable. Lymphatic: No palpable adenopathy. Respiratory: Lungs clear to auscultation, Normal chest excursion, No wheezes, and Non-labored breathing Cardiovascular: Regular rate and rhythm.  No murmurs. and No edema, swelling or tenderness, except as noted in detailed exam. Integumentary: No impressive skin lesions present, except as noted in detailed exam. Musculoskeletal: Unremarkable, except as noted in detailed exam.  Right shoulder exam: SKIN: Normal SWELLING: None WARMTH: None LYMPH NODES: No adenopathy palpable CREPITUS: None TENDERNESS: Mild tenderness over anterior shoulder. ROM (active):      Forward flexion: 155 degrees    Abduction: 150 degrees    Internal rotation: Right PSIS ROM (passive):      Forward flexion: 160 degrees    Abduction: 155 degrees    ER/IR at 90 abd: 90 degrees/55 degrees   She describes minimal discomfort with all motions.   STRENGTH:   Forward flexion: 4/5                         Abduction: 4-4+/5                          External rotation: 4-4+/5                         Internal rotation: 4-4+/5                         Pain with RC testing: She has mild-moderate pain with resisted forward flexion and mild  pain with resisted external rotation.   STABILITY: Normal   SPECIAL TESTS:       Vonzell' test: Moderately-positive                                     Speed's test: Mildly positive                                     Capsulitis - pain w/ passive ER: No                                     Crossed arm test: Mildly positive                                     Crank: Not evaluated                                     Anterior apprehension: Negative                                     Posterior apprehension: Not evaluated   She remains neurovascularly intact to the right upper extremity.  Imaging: MRI OF THE RIGHT SHOULDER WITHOUT CONTRAST 02/27/24  TECHNIQUE:  Multiplanar, multisequence MR imaging of the shoulder was performed.  No intravenous contrast was administered.   COMPARISON:  None Available.   FINDINGS:  Rotator cuff: Significant rotator cuff tendinopathy/tendinosis with  thickening and increased T2 signal intensity in the infraspinatus  and supraspinatus tendons. There is an articular surface tear  involving the supraspinatus tendon with laminar retraction of the  articular fibers estimated at 12 mm. There is a full-thickness  retracted tear involving the subscapularis tendon from its humeral  attachment site. Maximum retraction is approximately 18 mm. Because  of this the long head biceps tendon is dislocated medially from the  bicipital groove.   Muscles:  No significant findings.   Biceps long head: Intact. Moderate tendinopathy involving the  intra-articular portion.   Acromioclavicular Joint: Mild degenerative changes. Type 2 acromion.  No lateral downsloping or subacromial spurring.   Glenohumeral Joint: Mild degenerative changes.   Labrum:  No labral tears.   Bones:   No acute bony findings.   Other: No subacromial/subdeltoid fluid collections to suggest  bursitis.   IMPRESSION:  1. Significant rotator cuff tendinopathy/tendinosis. There is an  articular surface tear involving the supraspinatus tendon with  laminar retraction of the articular fibers estimated at 12 mm.  2. Full-thickness retracted tear involving the subscapularis tendon  from its humeral attachment site. Because of this the long head  biceps tendon is dislocated medially from the bicipital groove.  3. Intact long head biceps tendon and glenoid labrum. Moderate  tendinopathy involving the intra-articular portion of the biceps  tendon.  4. Mild AC joint degenerative changes and type 2 acromion but no  other significant findings for bony impingement.   Assessment: Encounter Diagnoses  Name Primary?  SABRA Nontraumatic complete tear of right rotator cuff Yes  . Rotator cuff tendinitis, right   .  Tendinitis of upper biceps tendon of right shoulder   . Shoulder impingement   . Primary osteoarthritis of right shoulder    Plan: 1.  Treatment options were discussed today with the patient. 2.  The patient is scheduled for a right shoulder arthroscopy with debridement, decompression, mini open rotator cuff repair and probable biceps tenodesis with Dr. Edie on 05/18/2024. 3.  The patient was instructed on the risk and benefits of surgical intervention and wishes to proceed at this time. 4.  This document will serve as a surgical history and physical for the patient. 5.  The patient will follow-up per standard postop protocol.  She can contact the clinic if she has any questions, new symptoms develop or symptoms worsen.  The procedure was discussed with the patient, as were the potential risks (including bleeding, infection, nerve and/or blood vessel injury, persistent or recurrent pain, failure of the repair, progression of arthritis, need for further surgery, blood clots, strokes, heart attacks  and/or arhythmias, pneumonia, etc.) and benefits.  The patient states her understanding and wishes to proceed.  DOROTHA Gustavo Level, PA-C, CAQ-OS [facility]

## 2024-05-11 ENCOUNTER — Other Ambulatory Visit: Payer: Self-pay

## 2024-05-11 ENCOUNTER — Encounter
Admission: RE | Admit: 2024-05-11 | Discharge: 2024-05-11 | Disposition: A | Discharge status: Home / Self Care | Source: Ambulatory Visit | Attending: Surgery | Admitting: Surgery

## 2024-05-11 VITALS — Ht 66.0 in | Wt 184.0 lb

## 2024-05-11 DIAGNOSIS — N202 Calculus of kidney with calculus of ureter: Secondary | ICD-10-CM | POA: Diagnosis not present

## 2024-05-11 DIAGNOSIS — C672 Malignant neoplasm of lateral wall of bladder: Secondary | ICD-10-CM | POA: Diagnosis not present

## 2024-05-11 DIAGNOSIS — R079 Chest pain, unspecified: Secondary | ICD-10-CM

## 2024-05-11 DIAGNOSIS — I10 Essential (primary) hypertension: Secondary | ICD-10-CM

## 2024-05-11 NOTE — Patient Instructions (Addendum)
 Your procedure is scheduled on: Tuesday 05/18/24 Report to the Registration Desk on the 1st floor of the [facility]. To find out your arrival time, please call [phone] between 1PM - 3PM on: Monday 05/17/24 If your arrival time is 6:00 am, do not arrive before that time as the [facility] entrance doors do not open until 6:00 am.  REMEMBER: Instructions that are not followed completely may result in serious medical risk, up to and including death; or upon the discretion of your surgeon and anesthesiologist your surgery may need to be rescheduled.  Do not eat food after midnight the night before surgery.  No gum chewing or hard candies.  You may however, drink CLEAR liquids up to 2 hours before you are scheduled to arrive for your surgery. Do not drink anything within 2 hours of your scheduled arrival time.  Clear liquids include: - water   - apple juice without pulp - black coffee or tea (Do NOT add milk or creamers to the coffee or tea) Do NOT drink anything that is not on this list.  In addition, your doctor has ordered for you to drink the provided:  Ensure Pre-Surgery Clear Carbohydrate Drink  Drinking this carbohydrate drink up to two hours before surgery helps to reduce insulin resistance and improve patient outcomes. Please complete drinking 2 hours before scheduled arrival time.  One week prior to surgery: Stop Anti-inflammatories (NSAIDS) such as Advil , Aleve , Ibuprofen , Motrin , Naproxen , Naprosyn  and Aspirin  based products such as Excedrin, Goody's Powder, BC Powder. Stop ANY OVER THE COUNTER supplements until after surgery. COLLAGEN PO  Cholecalciferol  (VITAMIN D3)  Calcium  Carb-Cholecalciferol  (CALCIUM  500+D3  ascorbic acid (VITAMIN C) 500 MG  You may however, continue to take Tylenol  if needed for pain up until the day of surgery.   Continue taking all of your other prescription medications up until the day of surgery.  ON THE DAY OF SURGERY ONLY TAKE THESE  MEDICATIONS WITH SIPS OF WATER :  buPROPion  (WELLBUTRIN  XL) 300 MG  sertraline  (ZOLOFT ) 100 MG  amLODipine  (NORVASC ) 5 MG    No Alcohol for 24 hours before or after surgery.  No Smoking including e-cigarettes for 24 hours before surgery.  No chewable tobacco products for at least 6 hours before surgery.  No nicotine patches on the day of surgery.  Do not use any recreational drugs for at least a week (preferably 2 weeks) before your surgery.  Please be advised that the combination of cocaine and anesthesia may have negative outcomes, up to and including death. If you test positive for cocaine, your surgery will be cancelled.  On the morning of surgery brush your teeth with toothpaste and water , you may rinse your mouth with mouthwash if you wish. Do not swallow any toothpaste or mouthwash.  Use CHG Soap or wipes as directed on instruction sheet.  Do not wear jewelry, make-up, hairpins, clips or nail polish.  For welded (permanent) jewelry: bracelets, anklets, waist bands, etc.  Please have this removed prior to surgery.  If it is not removed, there is a chance that hospital personnel will need to cut it off on the day of surgery.  Do not wear lotions, powders, or perfumes.   Do not shave body hair from the neck down 48 hours before surgery.  Contact lenses, hearing aids and dentures may not be worn into surgery.  Do not bring valuables to the hospital. [facility] is not responsible for any missing/lost belongings or valuables.   Notify your doctor if  there is any change in your medical condition (cold, fever, infection).  Wear comfortable clothing (specific to your surgery type) to the hospital.  After surgery, you can help prevent lung complications by doing breathing exercises.  Take deep breaths and cough every 1-2 hours. Your doctor may order a device called an Incentive Spirometer to help you take deep breaths. When coughing or sneezing, hold a pillow firmly against  your incision with both hands. This is called "splinting." Doing this helps protect your incision. It also decreases belly discomfort.  If you are being admitted to the hospital overnight, leave your suitcase in the car. After surgery it may be brought to your room.  In case of increased patient census, it may be necessary for you, the patient, to continue your postoperative care in the Same Day Surgery department.  If you are being discharged the day of surgery, you will not be allowed to drive home. You will need a responsible individual to drive you home and stay with you for 24 hours after surgery.   If you are taking public transportation, you will need to have a responsible individual with you.  Please call the Pre-admissions Testing Dept. at [phone] if you have any questions about these instructions.  Surgery Visitation Policy:  Patients having surgery or a procedure may have two visitors.  Children under the age of 79 must have an adult with them who is not the patient.  Inpatient Visitation:    Visiting hours are 7 a.m. to 8 p.m. Up to four visitors are allowed at one time in a patient room. The visitors may rotate out with other people during the day.  One visitor age 14 or older may stay with the patient overnight and must be in the room by 8 p.m.   Merchandiser, retail to address health-related social needs:  [email or web address]                                                                                                            Preparing for Surgery with CHLORHEXIDINE  GLUCONATE (CHG) Soap  Chlorhexidine  Gluconate (CHG) Soap  o An antiseptic cleaner that kills germs and bonds with the skin to continue killing germs even after washing  o Used for showering the night before surgery and morning of surgery  Before surgery, you can play an important role by reducing the number of germs on your skin.  CHG (Chlorhexidine  gluconate) soap is  an antiseptic cleanser which kills germs and bonds with the skin to continue killing germs even after washing.  Please do not use if you have an allergy to CHG or antibacterial soaps. If your skin becomes reddened/irritated stop using the CHG.  1. Shower the NIGHT BEFORE SURGERY and the MORNING OF SURGERY with CHG soap.  2. If you choose to wash your hair, wash your hair first as usual with your normal shampoo.  3. After shampooing, rinse your hair and body thoroughly to remove the shampoo.  4. Use CHG as you would any other liquid soap. You  can apply CHG directly to the skin and wash gently with a scrungie or a clean washcloth.  5. Apply the CHG soap to your body only from the neck down. Do not use on open wounds or open sores. Avoid contact with your eyes, ears, mouth, and genitals (private parts). Wash face and genitals (private parts) with your normal soap.  6. Wash thoroughly, paying special attention to the area where your surgery will be performed.  7. Thoroughly rinse your body with warm water .  8. Do not shower/wash with your normal soap after using and rinsing off the CHG soap.  9. Pat yourself dry with a clean towel.  10. Wear clean pajamas to bed the night before surgery.  12. Place clean sheets on your bed the night of your first shower and do not sleep with pets.  13. Shower again with the CHG soap on the day of surgery prior to arriving at the hospital.  14. Do not apply any deodorants/lotions/powders.  15. Please wear clean clothes to the hospital. How to Use an Incentive Spirometer  An incentive spirometer is a tool that measures how well you are filling your lungs with each breath. Learning to take long, deep breaths using this tool can help you keep your lungs clear and active. This may help to reverse or lessen your chance of developing breathing (pulmonary) problems, especially infection. You may be asked to use a spirometer: After a surgery. If you have a lung  problem or a history of smoking. After a long period of time when you have been unable to move or be active. If the spirometer includes an indicator to show the highest number that you have reached, your health care provider or respiratory therapist will help you set a goal. Keep a log of your progress as told by your health care provider. What are the risks? Breathing too quickly may cause dizziness or cause you to pass out. Take your time so you do not get dizzy or light-headed. If you are in pain, you may need to take pain medicine before doing incentive spirometry. It is harder to take a deep breath if you are having pain. How to use your incentive spirometer  Sit up on the edge of your bed or on a chair. Hold the incentive spirometer so that it is in an upright position. Before you use the spirometer, breathe out normally. Place the mouthpiece in your mouth. Make sure your lips are closed tightly around it. Breathe in slowly and as deeply as you can through your mouth, causing the piston or the ball to rise toward the top of the chamber. Hold your breath for 3-5 seconds, or for as long as possible. If the spirometer includes a coach indicator, use this to guide you in breathing. Slow down your breathing if the indicator goes above the marked areas. Remove the mouthpiece from your mouth and breathe out normally. The piston or ball will return to the bottom of the chamber. Rest for a few seconds, then repeat the steps 10 or more times. Take your time and take a few normal breaths between deep breaths so that you do not get dizzy or light-headed. Do this every 1-2 hours when you are awake. If the spirometer includes a goal marker to show the highest number you have reached (best effort), use this as a goal to work toward during each repetition. After each set of 10 deep breaths, cough a few times. This will help to make  sure that your lungs are clear. If you have an incision on your chest or  abdomen from surgery, place a pillow or a rolled-up towel firmly against the incision when you cough. This can help to reduce pain while taking deep breaths and coughing. General tips When you are able to get out of bed: Walk around often. Continue to take deep breaths and cough in order to clear your lungs. Keep using the incentive spirometer until your health care provider says it is okay to stop using it. If you have been in the hospital, you may be told to keep using the spirometer at home. Contact a health care provider if: You are having difficulty using the spirometer. You have trouble using the spirometer as often as instructed. Your pain medicine is not giving enough relief for you to use the spirometer as told. You have a fever. Get help right away if: You develop shortness of breath. You develop a cough with bloody mucus from the lungs. You have fluid or blood coming from an incision site after you cough. Summary An incentive spirometer is a tool that can help you learn to take long, deep breaths to keep your lungs clear and active. You may be asked to use a spirometer after a surgery, if you have a lung problem or a history of smoking, or if you have been inactive for a long period of time. Use your incentive spirometer as instructed every 1-2 hours while you are awake. If you have an incision on your chest or abdomen, place a pillow or a rolled-up towel firmly against your incision when you cough. This will help to reduce pain. Get help right away if you have shortness of breath, you cough up bloody mucus, or blood comes from your incision when you cough. This information is not intended to replace advice given to you by your health care provider. Make sure you discuss any questions you have with your health care provider.

## 2024-05-12 ENCOUNTER — Encounter
Admission: RE | Admit: 2024-05-12 | Discharge: 2024-05-12 | Disposition: A | Discharge status: Home / Self Care | Source: Ambulatory Visit | Attending: Surgery | Admitting: Surgery

## 2024-05-12 DIAGNOSIS — Z01818 Encounter for other preprocedural examination: Secondary | ICD-10-CM | POA: Insufficient documentation

## 2024-05-12 DIAGNOSIS — I10 Essential (primary) hypertension: Secondary | ICD-10-CM | POA: Insufficient documentation

## 2024-05-12 DIAGNOSIS — R079 Chest pain, unspecified: Secondary | ICD-10-CM | POA: Insufficient documentation

## 2024-05-12 LAB — BASIC METABOLIC PANEL WITH GFR
Anion gap: 12 (ref 5–15)
BUN: 11 mg/dL (ref 8–23)
CO2: 27 mmol/L (ref 22–32)
Calcium: 9.5 mg/dL (ref 8.9–10.3)
Chloride: 104 mmol/L (ref 98–111)
Creatinine, Ser: 0.81 mg/dL (ref 0.44–1.00)
GFR, Estimated: >60 mL/min (ref >60)
Glucose, Bld: 110 mg/dL — ABNORMAL HIGH (ref 70–99)
Potassium: 3.7 mmol/L (ref 3.5–5.1)
Sodium: 143 mmol/L (ref 135–145)

## 2024-05-12 LAB — CBC
HCT: 44.6 % (ref 36.0–46.0)
Hemoglobin: 16.2 g/dL — ABNORMAL HIGH (ref 12.0–15.0)
MCH: 31.5 pg (ref 26.0–34.0)
MCHC: 36.3 g/dL — ABNORMAL HIGH (ref 30.0–36.0)
MCV: 86.8 fL (ref 80.0–100.0)
Platelets: 258 K/uL (ref 150–400)
RBC: 5.14 MIL/uL — ABNORMAL HIGH (ref 3.87–5.11)
RDW: 12.7 % (ref 11.5–15.5)
WBC: 7.5 K/uL (ref 4.0–10.5)
nRBC: 0 % (ref 0.0–0.2)

## 2024-05-18 ENCOUNTER — Other Ambulatory Visit: Payer: Self-pay

## 2024-05-18 ENCOUNTER — Ambulatory Visit: Admitting: General Practice

## 2024-05-18 ENCOUNTER — Other Ambulatory Visit: Payer: Self-pay | Admitting: Hematology and Oncology

## 2024-05-18 ENCOUNTER — Ambulatory Visit
Admission: RE | Admit: 2024-05-18 | Discharge: 2024-05-18 | Disposition: A | Discharge status: Home / Self Care | Attending: Surgery | Admitting: Surgery

## 2024-05-18 ENCOUNTER — Ambulatory Visit

## 2024-05-18 ENCOUNTER — Encounter
Admission: RE | Disposition: A | Discharge status: Home / Self Care | Payer: Self-pay | Source: Home / Self Care | Attending: Surgery

## 2024-05-18 ENCOUNTER — Encounter: Payer: Self-pay | Admitting: Surgery

## 2024-05-18 ENCOUNTER — Ambulatory Visit: Payer: Self-pay | Admitting: Urgent Care

## 2024-05-18 DIAGNOSIS — Z791 Long term (current) use of non-steroidal anti-inflammatories (NSAID): Secondary | ICD-10-CM | POA: Diagnosis not present

## 2024-05-18 DIAGNOSIS — M7521 Bicipital tendinitis, right shoulder: Secondary | ICD-10-CM | POA: Diagnosis not present

## 2024-05-18 DIAGNOSIS — I252 Old myocardial infarction: Secondary | ICD-10-CM | POA: Diagnosis not present

## 2024-05-18 DIAGNOSIS — M75121 Complete rotator cuff tear or rupture of right shoulder, not specified as traumatic: Secondary | ICD-10-CM | POA: Diagnosis not present

## 2024-05-18 DIAGNOSIS — Z79811 Long term (current) use of aromatase inhibitors: Secondary | ICD-10-CM | POA: Diagnosis not present

## 2024-05-18 DIAGNOSIS — I10 Essential (primary) hypertension: Secondary | ICD-10-CM | POA: Diagnosis not present

## 2024-05-18 DIAGNOSIS — M7581 Other shoulder lesions, right shoulder: Secondary | ICD-10-CM | POA: Insufficient documentation

## 2024-05-18 DIAGNOSIS — M19011 Primary osteoarthritis, right shoulder: Secondary | ICD-10-CM | POA: Diagnosis not present

## 2024-05-18 DIAGNOSIS — G4730 Sleep apnea, unspecified: Secondary | ICD-10-CM | POA: Diagnosis not present

## 2024-05-18 DIAGNOSIS — M24111 Other articular cartilage disorders, right shoulder: Secondary | ICD-10-CM | POA: Diagnosis not present

## 2024-05-18 DIAGNOSIS — K219 Gastro-esophageal reflux disease without esophagitis: Secondary | ICD-10-CM | POA: Diagnosis not present

## 2024-05-18 DIAGNOSIS — G8918 Other acute postprocedural pain: Secondary | ICD-10-CM | POA: Diagnosis not present

## 2024-05-18 DIAGNOSIS — I2510 Atherosclerotic heart disease of native coronary artery without angina pectoris: Secondary | ICD-10-CM | POA: Insufficient documentation

## 2024-05-18 DIAGNOSIS — M7541 Impingement syndrome of right shoulder: Secondary | ICD-10-CM | POA: Insufficient documentation

## 2024-05-18 DIAGNOSIS — F32A Depression, unspecified: Secondary | ICD-10-CM | POA: Insufficient documentation

## 2024-05-18 DIAGNOSIS — Z79899 Other long term (current) drug therapy: Secondary | ICD-10-CM | POA: Diagnosis not present

## 2024-05-18 DIAGNOSIS — F419 Anxiety disorder, unspecified: Secondary | ICD-10-CM | POA: Insufficient documentation

## 2024-05-18 HISTORY — PX: SUBACROMIAL DECOMPRESSION: SHX5174

## 2024-05-18 HISTORY — PX: ARTHOSCOPIC ROTAOR CUFF REPAIR: SHX5002

## 2024-05-18 HISTORY — PX: BICEPT TENODESIS: SHX5116

## 2024-05-18 HISTORY — PX: POSTERIOR LUMBAR FUSION 2 WITH HARDWARE REMOVAL: SHX7297

## 2024-05-18 HISTORY — PX: SHOULDER OPEN ROTATOR CUFF REPAIR: SHX2407

## 2024-05-18 SURGERY — ARTHROSCOPY, SHOULDER WITH DEBRIDEMENT
Anesthesia: General | Site: Shoulder | Laterality: Right

## 2024-05-18 MED ORDER — EPHEDRINE 5 MG/ML INJ
INTRAVENOUS | Status: AC
  Filled 2024-05-18: qty 5

## 2024-05-18 MED ORDER — BUPIVACAINE-EPINEPHRINE (PF) 0.5% -1:200000 IJ SOLN
INTRAMUSCULAR | Status: DC | PRN
  Administered 2024-05-18: 30 mL

## 2024-05-18 MED ORDER — BUPIVACAINE LIPOSOME 1.3 % IJ SUSP
INTRAMUSCULAR | Status: AC
  Filled 2024-05-18: qty 20

## 2024-05-18 MED ORDER — ONDANSETRON HCL 4 MG/2ML IJ SOLN
INTRAMUSCULAR | Status: AC
  Filled 2024-05-18: qty 2

## 2024-05-18 MED ORDER — ACETAMINOPHEN 10 MG/ML IV SOLN
INTRAVENOUS | Status: DC | PRN
  Administered 2024-05-18: 1000 mg via INTRAVENOUS

## 2024-05-18 MED ORDER — FENTANYL CITRATE (PF) 100 MCG/2ML IJ SOLN
INTRAMUSCULAR | Status: DC | PRN
  Administered 2024-05-18 (×2): 25 ug via INTRAVENOUS
  Administered 2024-05-18: 50 ug via INTRAVENOUS

## 2024-05-18 MED ORDER — BUPIVACAINE-EPINEPHRINE (PF) 0.5% -1:200000 IJ SOLN
INTRAMUSCULAR | Status: AC
  Filled 2024-05-18: qty 30

## 2024-05-18 MED ORDER — CEFAZOLIN SODIUM-DEXTROSE 2-4 GM/100ML-% IV SOLN
INTRAVENOUS | Status: AC
  Filled 2024-05-18: qty 100

## 2024-05-18 MED ORDER — MIDAZOLAM HCL 2 MG/2ML IJ SOLN
2.0000 mg | Freq: Once | INTRAMUSCULAR | Status: AC
  Administered 2024-05-18: 2 mg via INTRAVENOUS

## 2024-05-18 MED ORDER — OXYCODONE HCL 5 MG/5ML PO SOLN: 5.0000 mg | Freq: Once | ORAL | Status: DC | PRN

## 2024-05-18 MED ORDER — CHLORHEXIDINE GLUCONATE 0.12 % MT SOLN
OROMUCOSAL | Status: AC
  Filled 2024-05-18: qty 15

## 2024-05-18 MED ORDER — FENTANYL CITRATE PF 50 MCG/ML IJ SOSY
PREFILLED_SYRINGE | INTRAMUSCULAR | Status: AC
  Filled 2024-05-18: qty 1

## 2024-05-18 MED ORDER — SEVOFLURANE IN SOLN
RESPIRATORY_TRACT | Status: AC
  Filled 2024-05-18: qty 250

## 2024-05-18 MED ORDER — KETOROLAC TROMETHAMINE 15 MG/ML IJ SOLN
INTRAMUSCULAR | Status: AC
  Filled 2024-05-18: qty 1

## 2024-05-18 MED ORDER — OXYCODONE HCL 5 MG PO TABS: 5.0000 mg | ORAL_TABLET | ORAL | 0 refills | Status: DC | PRN

## 2024-05-18 MED ORDER — RINGERS IRRIGATION IR SOLN
Status: DC | PRN
  Administered 2024-05-18: 3000 mL

## 2024-05-18 MED ORDER — FENTANYL CITRATE (PF) 100 MCG/2ML IJ SOLN
INTRAMUSCULAR | Status: AC
  Filled 2024-05-18: qty 2

## 2024-05-18 MED ORDER — ACETAMINOPHEN 10 MG/ML IV SOLN
INTRAVENOUS | Status: AC
  Filled 2024-05-18: qty 100

## 2024-05-18 MED ORDER — LIDOCAINE HCL (PF) 2 % IJ SOLN
INTRAMUSCULAR | Status: AC
  Filled 2024-05-18: qty 5

## 2024-05-18 MED ORDER — CHLORHEXIDINE GLUCONATE 0.12 % MT SOLN
15.0000 mL | Freq: Once | OROMUCOSAL | Status: AC
  Administered 2024-05-18: 15 mL via OROMUCOSAL

## 2024-05-18 MED ORDER — DEXAMETHASONE SODIUM PHOSPHATE 10 MG/ML IJ SOLN
INTRAMUSCULAR | Status: DC | PRN
  Administered 2024-05-18: 10 mg via INTRAVENOUS

## 2024-05-18 MED ORDER — LACTATED RINGERS IV SOLN: INTRAVENOUS | Status: DC

## 2024-05-18 MED ORDER — DEXAMETHASONE SODIUM PHOSPHATE 10 MG/ML IJ SOLN
INTRAMUSCULAR | Status: AC
  Filled 2024-05-18: qty 1

## 2024-05-18 MED ORDER — BUPIVACAINE HCL (PF) 0.5 % IJ SOLN
INTRAMUSCULAR | Status: DC | PRN
Start: 2024-05-18 — End: 2024-05-18
  Administered 2024-05-18: 10 mL

## 2024-05-18 MED ORDER — OXYCODONE HCL 5 MG PO TABS: 5.0000 mg | ORAL_TABLET | Freq: Once | ORAL | Status: DC | PRN

## 2024-05-18 MED ORDER — CEFAZOLIN SODIUM-DEXTROSE 2-4 GM/100ML-% IV SOLN
2.0000 g | INTRAVENOUS | Status: AC
  Administered 2024-05-18: 2 g via INTRAVENOUS

## 2024-05-18 MED ORDER — SUGAMMADEX SODIUM 200 MG/2ML IV SOLN
INTRAVENOUS | Status: DC | PRN
  Administered 2024-05-18: 180 mg via INTRAVENOUS

## 2024-05-18 MED ORDER — LACTATED RINGERS IR SOLN
Status: DC | PRN
  Administered 2024-05-18: 3001 mL

## 2024-05-18 MED ORDER — EPHEDRINE SULFATE-NACL 50-0.9 MG/10ML-% IV SOSY
PREFILLED_SYRINGE | INTRAVENOUS | Status: DC | PRN
  Administered 2024-05-18 (×2): 5 mg via INTRAVENOUS

## 2024-05-18 MED ORDER — ROCURONIUM BROMIDE 100 MG/10ML IV SOLN
INTRAVENOUS | Status: DC | PRN
  Administered 2024-05-18: 60 mg via INTRAVENOUS
  Administered 2024-05-18: 10 mg via INTRAVENOUS

## 2024-05-18 MED ORDER — DEXMEDETOMIDINE HCL IN NACL 80 MCG/20ML IV SOLN
INTRAVENOUS | Status: DC | PRN
Start: 2024-05-18 — End: 2024-05-18
  Administered 2024-05-18: 8 ug via INTRAVENOUS

## 2024-05-18 MED ORDER — MIDAZOLAM HCL 2 MG/2ML IJ SOLN
INTRAMUSCULAR | Status: AC
  Filled 2024-05-18: qty 2

## 2024-05-18 MED ORDER — PROPOFOL 10 MG/ML IV BOLUS
INTRAVENOUS | Status: AC
  Filled 2024-05-18: qty 20

## 2024-05-18 MED ORDER — FENTANYL CITRATE PF 50 MCG/ML IJ SOSY
50.0000 ug | PREFILLED_SYRINGE | Freq: Once | INTRAMUSCULAR | Status: AC
  Administered 2024-05-18: 50 ug via INTRAVENOUS

## 2024-05-18 MED ORDER — PHENYLEPHRINE 80 MCG/ML (10ML) SYRINGE FOR IV PUSH (FOR BLOOD PRESSURE SUPPORT)
PREFILLED_SYRINGE | INTRAVENOUS | Status: DC | PRN
  Administered 2024-05-18 (×5): 80 ug via INTRAVENOUS

## 2024-05-18 MED ORDER — FENTANYL CITRATE (PF) 100 MCG/2ML IJ SOLN: 25.0000 ug | INTRAMUSCULAR | Status: DC | PRN

## 2024-05-18 MED ORDER — ORAL CARE MOUTH RINSE: 15.0000 mL | Freq: Once | OROMUCOSAL | Status: AC

## 2024-05-18 MED ORDER — PROPOFOL 10 MG/ML IV BOLUS
INTRAVENOUS | Status: DC | PRN
  Administered 2024-05-18: 100 mg via INTRAVENOUS

## 2024-05-18 MED ORDER — PHENYLEPHRINE 80 MCG/ML (10ML) SYRINGE FOR IV PUSH (FOR BLOOD PRESSURE SUPPORT)
PREFILLED_SYRINGE | INTRAVENOUS | Status: AC
  Filled 2024-05-18: qty 10

## 2024-05-18 MED ORDER — LIDOCAINE HCL (CARDIAC) PF 100 MG/5ML IV SOSY
PREFILLED_SYRINGE | INTRAVENOUS | Status: DC | PRN
  Administered 2024-05-18: 100 mg via INTRAVENOUS

## 2024-05-18 MED ORDER — EPINEPHRINE PF 1 MG/ML IJ SOLN
INTRAMUSCULAR | Status: AC
  Filled 2024-05-18: qty 1

## 2024-05-18 MED ORDER — BUPIVACAINE LIPOSOME 1.3 % IJ SUSP
INTRAMUSCULAR | Status: DC | PRN
  Administered 2024-05-18: 20 mL

## 2024-05-18 MED ORDER — BUPIVACAINE HCL (PF) 0.5 % IJ SOLN
INTRAMUSCULAR | Status: AC
  Filled 2024-05-18: qty 10

## 2024-05-18 MED ORDER — SODIUM CHLORIDE 0.9 % IV SOLN: INTRAVENOUS | Status: DC | PRN

## 2024-05-18 MED ORDER — ROCURONIUM BROMIDE 10 MG/ML (PF) SYRINGE
PREFILLED_SYRINGE | INTRAVENOUS | Status: AC
  Filled 2024-05-18: qty 10

## 2024-05-18 MED ORDER — ONDANSETRON HCL 4 MG/2ML IJ SOLN
INTRAMUSCULAR | Status: DC | PRN
  Administered 2024-05-18: 4 mg via INTRAVENOUS

## 2024-05-18 MED ORDER — KETOROLAC TROMETHAMINE 15 MG/ML IJ SOLN
15.0000 mg | Freq: Once | INTRAMUSCULAR | Status: AC
  Administered 2024-05-18: 15 mg via INTRAVENOUS

## 2024-05-18 SURGICAL SUPPLY — 44 items
ANCHOR BONE REGENETEN (Anchor) IMPLANT
ANCHOR JUGGERKNOT SOFT 2.9 (Anchor) IMPLANT
ANCHOR QFIX KTLS 1.8 MINI BLUE (Anchor) IMPLANT
ANCHOR TENDON REGENETEN (Staple) IMPLANT
BIT DRILL JUGRKNT W/NDL BIT2.9 (DRILL) IMPLANT
BLADE FULL RADIUS 3.5 (BLADE) ×2 IMPLANT
BUR ACROMIONIZER 4.0 (BURR) ×2 IMPLANT
CHLORAPREP W/TINT 26 (MISCELLANEOUS) ×2 IMPLANT
COOLER ICEMAN CLASSIC (MISCELLANEOUS) IMPLANT
COVER MAYO STAND STRL (DRAPES) ×2 IMPLANT
ELECTRODE REM PT RTRN 9FT ADLT (ELECTROSURGICAL) ×2 IMPLANT
GAUZE SPONGE 4X4 12PLY STRL (GAUZE/BANDAGES/DRESSINGS) ×2 IMPLANT
GAUZE XEROFORM 1X8 LF (GAUZE/BANDAGES/DRESSINGS) ×2 IMPLANT
GLOVE BIO SURGEON STRL SZ7.5 (GLOVE) ×4 IMPLANT
GLOVE BIO SURGEON STRL SZ8 (GLOVE) ×4 IMPLANT
GLOVE BIOGEL PI IND STRL 8 (GLOVE) ×2 IMPLANT
GLOVE INDICATOR 8.0 STRL GRN (GLOVE) ×2 IMPLANT
GOWN STRL REUS W/ TWL LRG LVL3 (GOWN DISPOSABLE) ×2 IMPLANT
GOWN STRL REUS W/ TWL XL LVL3 (GOWN DISPOSABLE) ×2 IMPLANT
IMPL REGENETEN MEDIUM (Shoulder) IMPLANT
IV LR IRRIG 3000ML ARTHROMATIC (IV SOLUTION) ×2 IMPLANT
KIT CANNULA 8X76-LX IN CANNULA (CANNULA) ×2 IMPLANT
MANIFOLD NEPTUNE II (INSTRUMENTS) ×4 IMPLANT
MASK FACE SPIDER DISP (MASK) ×2 IMPLANT
MAT ABSORB FLUID 56X50 GRAY (MISCELLANEOUS) ×2 IMPLANT
PACK ARTHROSCOPY SHOULDER (MISCELLANEOUS) ×2 IMPLANT
PAD ABD DERMACEA PRESS 5X9 (GAUZE/BANDAGES/DRESSINGS) ×4 IMPLANT
PAD COLD SHLDR SM WRAP-ON (PAD) IMPLANT
PASSER SUT SHUTTLE 45D CVD LFT (SUTURE) IMPLANT
PENCIL SMOKE EVACUATOR (MISCELLANEOUS) IMPLANT
SLING ARM LRG DEEP (SOFTGOODS) ×2 IMPLANT
SLING ULTRA II LG (MISCELLANEOUS) ×2 IMPLANT
SLING ULTRA II M (MISCELLANEOUS) IMPLANT
SPONGE T-LAP 18X18 ~~LOC~~+RFID (SPONGE) ×2 IMPLANT
STAPLER SKIN PROX 35W (STAPLE) ×2 IMPLANT
STRAP SAFETY 5IN WIDE (MISCELLANEOUS) ×2 IMPLANT
SUT ETHIBOND 0 MO6 C/R (SUTURE) ×2 IMPLANT
SUT ULTRABRAID 2 COBRAID 38 (SUTURE) IMPLANT
SUT VIC AB 2-0 CT1 TAPERPNT 27 (SUTURE) ×4 IMPLANT
TAPE MICROFOAM 4IN (TAPE) ×2 IMPLANT
TRAP FLUID SMOKE EVACUATOR (MISCELLANEOUS) ×2 IMPLANT
TUBE SET DOUBLEFLO INFLOW (TUBING) ×2 IMPLANT
WAND WEREWOLF FLOW 90D (MISCELLANEOUS) ×2 IMPLANT
WATER STERILE IRR 500ML POUR (IV SOLUTION) ×2 IMPLANT

## 2024-05-18 NOTE — Transfer of Care (Signed)
 Immediate Anesthesia Transfer of Care Note  Patient: Danielle Knapp  Procedure(s) Performed: ARTHROSCOPY, SHOULDER WITH DEBRIDEMENT (Right: Shoulder) DECOMPRESSION, SUBACROMIAL SPACE (Right: Shoulder) REPAIR, ROTATOR CUFF, ARTHROSCOPIC (Right: Shoulder) REPAIR, ROTATOR CUFF, OPEN (Right: Shoulder) TENODESIS, BICEPS (Right: Shoulder)  Patient Location: PACU  Anesthesia Type:General  Level of Consciousness: drowsy  Airway & Oxygen Therapy: Patient Spontanous Breathing and Patient connected to face mask oxygen  Post-op Assessment: Report given to RN and Post -op Vital signs reviewed and stable  Post vital signs: Reviewed and stable  Last Vitals:  Vitals Value Taken Time  BP 125/69 05/18/24 12:06  Temp 35.9 1206  Pulse 84 05/18/24 12:11  Resp 18 05/18/24 12:11  SpO2 100 % 05/18/24 12:11  Vitals shown include unfiled device data.  Last Pain:  Vitals:   05/18/24 0844  TempSrc: Temporal  PainSc: 0-No pain         Complications: No notable events documented.

## 2024-05-18 NOTE — Anesthesia Preprocedure Evaluation (Signed)
 Anesthesia Evaluation  Patient identified by MRN, date of birth, ID band Patient awake    Reviewed: Allergy & Precautions, NPO status , Patient's Chart, lab work & pertinent test results  Airway Mallampati: I  TM Distance: >3 FB Neck ROM: Full    Dental no notable dental hx. (+) Teeth Intact, Dental Advisory Given   Pulmonary neg pulmonary ROS, sleep apnea    Pulmonary exam normal breath sounds clear to auscultation       Cardiovascular hypertension, Pt. on medications + CAD and + Past MI  negative cardio ROS Normal cardiovascular exam Rhythm:Regular Rate:Normal  TTE 2019 - LVEF 60-65%, normal wall thickness, normal wall motion, grade 1    DD, indeterminate LV filling pressure, trivial MR, normal    biatrial size, dilated IVC.   Cath 2019  Prox LAD lesion is 10% stenosed.  Mid Cx lesion is 10% stenosed.  Dist RCA lesion is 10% stenosed.  The left ventricular systolic function is normal.  LV end diastolic pressure is normal.  The left ventricular ejection fraction is 55-65% by visual estimate.  There is no aortic valve stenosis.     Neuro/Psych  PSYCHIATRIC DISORDERS Anxiety Depression    negative neurological ROS  negative psych ROS   GI/Hepatic negative GI ROS, Neg liver ROS,GERD  Medicated,,  Endo/Other  negative endocrine ROS    Renal/GU      Musculoskeletal   Abdominal   Peds  Hematology negative hematology ROS (+)   Anesthesia Other Findings Past Medical History: No date: Allergic rhinitis No date: Allergy No date: Anxiety 10/17/2020: Bladder cancer (HCC) No date: Borderline glaucoma, left     Comment:  no eye drops No date: BPPV (benign paroxysmal positional vertigo) 01/23/2021: Breast cancer (HCC) 10/17/2020: Cancer (HCC)     Comment:  Bladder and Endomitrial cancer in addition to breast               cancer No date: Cataract No date: Chondromalacia of patella     Comment:  left No  date: Coronary artery disease     Comment:  cardiac cath 11-03-2017 done for positive ETT,  showed               very miminal nonobstrutive cad, ef 55-65%, non caridac               chest pain No date: Diverticulosis of colon No date: Endometrial cancer (HCC) 12/08/2020: Family history of breast cancer 12/08/2020: Family history of ovarian cancer 12/08/2020: Family history of prostate cancer No date: Frequency of urination No date: GERD (gastroesophageal reflux disease) 10/14/2017: History of chest pain     Comment:  ED visit w/ admission,  hypertensive urgency;  follow up              with dr claudene--- had outpatient ETT 10-21-2017 positive              ischemia, echo 10-16-2017 G1DD, 60-65%;    s/p cardiac               cath 11-03-2017 w/ very miminal nonobstructive cad ,               chest pain non cardiac 06/01/2021: History of COVID-19 No date: History of diverticulitis No date: History of kidney stones No date: HSV-1 infection     Comment:  fever blisters 02/08/2018: Hx of adenomatous colonic polyps No date: Hypertension     Comment:  followed by pcp No date: MDD (major depressive disorder)  2019: Myocardial infarction [facility])     Comment:  minor MI No date: OA (osteoarthritis)     Comment:  left knee, shoulder No date: Osteoporosis No date: Polycythemia     Comment:  long hx followed by pcp and  sees hematology/               oncology--- dr christella. sherrod on as needed basis, chronic and              told to donate blood regularly 02/2023: Rotator cuff tear     Comment:  right No date: Sleep apnea     Comment:  pt states minimal sleep apnea that did not require cpap No date: Stenosis of cervix No date: Uterine mass No date: Wears glasses  Past Surgical History: No date: ABDOMINAL HYSTERECTOMY 03/07/2023: ACHILLES TENDON SURGERY; Left     Comment:  Procedure: ACHILLES TENDON REPAIR - SECONDARY;  Surgeon:              Ashley Soulier, DPM;  Location: [facility];  Service:                Orthopedics/Podiatry;  Laterality: Left; 02/16/2021: BREAST LUMPECTOMY WITH RADIOACTIVE SEED LOCALIZATION;  Right     Comment:  Procedure: RIGHT BREAST LUMPECTOMY WITH RADIOACTIVE SEED              LOCALIZATION;  Surgeon: Vernetta Berg, MD;  Location:              [facility];  Service: General;                Laterality: Right; No date: CARDIAC CATHETERIZATION 2013: CATARACT EXTRACTION W/ INTRAOCULAR LENS  IMPLANT, BILATERAL 02-02-2018 dr avram: COLONOSCOPY 10/17/2020: CYSTOSCOPY     Comment:  Procedure: CYSTOSCOPY;  Surgeon: Cathlyn JAYSON Nikki Bobie FORBES, MD;  Location: [facility];  Service:               Gynecology;; 11/09/2020: PHYLLIS; N/A     Comment:  Procedure: CYSTOSCOPY;  Surgeon: Rosalind Zachary NOVAK, MD;               Location: [facility];  Service: Urology;  Laterality: N/A;                Dr. Rosalind needs to go first. [name] and rigid bx               forceps. 10/21/2023: CYSTOSCOPY/RETROGRADE/URETEROSCOPY; Left     Comment:  Procedure: CYSTOSCOPY/RETROGRADE/URETEROSCOPY;  Surgeon:              Watt Rush, MD;  Location: [facility];  Service: Urology;                Laterality: Left; 1996: DIAGNOSTIC LAPAROSCOPY 10/17/2020: DILATATION & CURETTAGE/HYSTEROSCOPY WITH MYOSURE; N/A     Comment:  Procedure: DILATATION & CURETTAGE/HYSTEROSCOPY WITH                MYOSURE RESECTION OF ENDOMETRIAL MASS,  OPENING OF               CERVICAL STENOSIS, DRAINAGE OF UTERINE FLUID;  Surgeon:               Cathlyn JAYSON Nikki Bobie FORBES, MD;  Location: [facility];  Service: Gynecology;  Laterality: N/A; No date: EYE SURGERY  03/07/2023: FLEXOR TENOTOMY; Left     Comment:  Procedure: 72341 - FLEXOR TENDON REPAIR;  Surgeon:               Ashley Soulier, DPM;  Location: [facility];  Service:               Orthopedics/Podiatry;  Laterality: Left; No date: FRACTURE SURGERY 2015: HEEL SPUR SURGERY; Right      Comment:  Dr. Anderson 2004: HEMORRHOID SURGERY 11/03/2017: LEFT HEART CATH AND CORONARY ANGIOGRAPHY; N/A     Comment:  Procedure: LEFT HEART CATH AND CORONARY ANGIOGRAPHY;                Surgeon: Dann Candyce RAMAN, MD;  Location: [facility]              [facility];  Service: Cardiovascular;  Laterality: N/A; 11/09/2020: LYMPH NODE DISSECTION; N/A     Comment:  Procedure: BILATERAL LYMPH NODE DISSECTION;MINI               LAPAROTOMY;  Surgeon: Viktoria Comer SAUNDERS, MD;  Location:              [facility];  Service: Gynecology;  Laterality: N/A; 10/17/2020: OPERATIVE ULTRASOUND; N/A     Comment:  Procedure: OPERATIVE ULTRASOUND;  Surgeon: Cathlyn JAYSON Nikki Bobie FORBES, MD;  Location: [facility];  Service: Gynecology;  Laterality: N/A; 03/07/2023: OSTECTOMY; Left     Comment:  Procedure: HAGLUNDS RETROCALCANEAL OSTECTOMY;  Surgeon:               Ashley Soulier, DPM;  Location: [facility];  Service:               Orthopedics/Podiatry;  Laterality: Left; 11/09/2020: ROBOTIC ASSISTED TOTAL HYSTERECTOMY WITH BILATERAL  SALPINGO OOPHERECTOMY; Bilateral     Comment:  Procedure: XI ROBOTIC ASSISTED TOTAL HYSTERECTOMY WITH               BILATERAL SALPINGO OOPHORECTOMY,;  Surgeon: Viktoria Comer SAUNDERS, MD;  Location: [facility];  Service: Gynecology;              Laterality: Bilateral; x2 1970's: SEPTOPLASTY age 68: TONSILLECTOMY 11/09/2020: TRANSURETHRAL RESECTION OF BLADDER TUMOR; N/A     Comment:  Procedure: TRANSURETHRAL RESECTION OF BLADDER TUMOR               (TURBT);  Surgeon: Rosalind Zachary NOVAK, MD;  Location: [facility];  Service: Urology;  Laterality: N/A;     Reproductive/Obstetrics negative OB ROS                              Anesthesia Physical Anesthesia Plan  ASA: 3  Anesthesia Plan: General ETT   Post-op Pain Management: Regional block*   Induction: Intravenous  PONV Risk Score and Plan:  Ondansetron , Dexamethasone  and Midazolam   Airway Management Planned: Oral ETT  Additional Equipment:   Intra-op Plan:   Post-operative Plan: Extubation in OR  Informed Consent: I have reviewed the patients History and Physical, chart, labs and discussed the procedure including the risks, benefits and alternatives for the proposed anesthesia with the patient or authorized representative  who has indicated his/her understanding and acceptance.     Dental Advisory Given  Plan Discussed with: Anesthesiologist, CRNA and Surgeon  Anesthesia Plan Comments: (Patient consented for risks of anesthesia including but not limited to:  - adverse reactions to medications - damage to eyes, teeth, lips or other oral mucosa - nerve damage due to positioning  - sore throat or hoarseness - Damage to heart, brain, nerves, lungs, other parts of body or loss of life  Patient voiced understanding and assent.)        Anesthesia Quick Evaluation

## 2024-05-18 NOTE — Anesthesia Postprocedure Evaluation (Signed)
 Anesthesia Post Note  Patient: Danielle Knapp  Procedure(s) Performed: ARTHROSCOPY, SHOULDER WITH DEBRIDEMENT (Right: Shoulder) DECOMPRESSION, SUBACROMIAL SPACE (Right: Shoulder) REPAIR, ROTATOR CUFF, ARTHROSCOPIC (Right: Shoulder) REPAIR, ROTATOR CUFF, OPEN (Right: Shoulder) TENODESIS, BICEPS (Right: Shoulder)  Patient location during evaluation: PACU Anesthesia Type: General Level of consciousness: awake and alert Pain management: pain level controlled Vital Signs Assessment: post-procedure vital signs reviewed and stable Respiratory status: spontaneous breathing, nonlabored ventilation, respiratory function stable and patient connected to nasal cannula oxygen Cardiovascular status: blood pressure returned to baseline and stable Postop Assessment: no apparent nausea or vomiting Anesthetic complications: no   No notable events documented.   Last Vitals:  Vitals:   05/18/24 1207 05/18/24 1215  BP: 125/69 131/69  Pulse: 81 86  Resp: 15 20  Temp: (!) 36.3 C   SpO2: 100% 100%    Last Pain:  Vitals:   05/18/24 1207  TempSrc:   PainSc: Asleep                 Debby Mines

## 2024-05-18 NOTE — H&P (Signed)
 History of Present Illness:  Danielle Knapp is a 68 y.o. female who presents today for her surgical history and physical for upcoming right shoulder arthroscopy with debridement, decompression, mini open rotator cuff repair and mini open biceps tenodesis. Surgery scheduled with Dr. Edie on 05/18/2024. The patient denies any changes in her medical history since her last evaluation. She denies any trauma or injury affecting the right shoulder since her last appointment. He does report increased discomfort especially with certain weight that she moves her arm since her shoulder department urgent at this time. She reports a 2 out of 10 pain score. She does have a history of abnormal heart attack in 2019, further workup was benign. No history of stroke, asthma, COPD, DVT or diabetes.  Current Outpatient Medications:  amLODIPine  (NORVASC ) 5 MG tablet Take by mouth  anastrozole  (ARIMIDEX ) 1 mg tablet TAKE ONE TABLET BY MOUTH ONE TIME DAILY START 06/02/2021  buPROPion  (WELLBUTRIN  XL) 300 MG XL tablet Take 300 mg by mouth once daily.  CALCIUM  CARBONATE (CALCIUM  600 ORAL) Take 1 tablet by mouth once daily  calcium  carbonate-vitamin D3 (CALTRATE 600+D) 600 mg(1,500mg ) -200 unit tablet Take by mouth  cholecalciferol  (VITAMIN D3) 1,250 mcg (50,000 unit) capsule Take by mouth  fluticasone propionate (FLONASE) 50 mcg/actuation nasal spray SPRAY 2 SPRAYS TWICE DAILY IN EACH NOSTRIL EVERY DAY  loratadine (CLARITIN) 10 mg tablet Take 1 tablet by mouth once daily  losartan  (COZAAR ) 25 MG tablet Take 12.5 mg by mouth once daily.  meloxicam (MOBIC) 15 MG tablet Take 15 mg by mouth once daily  metoprolol  tartrate (LOPRESSOR ) 25 MG tablet 0  minoxidiL  2.5 MG tablet Take 2.5 mg by mouth once daily  multivitamin with minerals tablet Take 1 tablet by mouth every evening  omeprazole (PRILOSEC) 40 MG DR capsule Take 40 mg by mouth once daily  ondansetron  (ZOFRAN ) 8 MG tablet TAKE ONE TABLET BY MOUTH EVERY 8 HOURS AS NEEDED FOR  4 DAYS  pantoprazole (PROTONIX) 40 MG DR tablet Take 40 mg by mouth once daily  sertraline  (ZOLOFT ) 100 MG tablet Take 150 mg by mouth once daily.  tamsulosin (FLOMAX) 0.4 mg capsule  valACYclovir  (VALTREX ) 1000 MG tablet Take by mouth  valsartan (DIOVAN) 320 MG tablet 1 tablet Orally Once a day; Duration: 90 days  zolpidem  (AMBIEN ) 10 mg tablet Take 10 mg by mouth nightly as needed for Sleep.   Allergies:  NSAIDs Cough (Can tolerate Ibuprofen )  Tolmetin Cough  Ace Inhibitors Cough and Other (See Comments)  Erythromycin Diarrhea  Eszopiclone Other (See Comments)  Ciprofloxacin Hcl Rash  Metronidazole Rash   Past Medical History:  Anxiety  Arthritis  Depression  History of cancer  HTN (hypertension)   Past Surgical History:  CATARACT EXTRACTION  HEEL SURGERY  MASTECTOMY PARTIAL / LUMPECTOMY N/A  SEPTOPLASTY  TONSILLECTOMY   Family History:  High blood pressure (Hypertension) Mother  Alzheimer's disease Mother  COPD Father  Diabetes type II Father  Ovarian cancer Sister   Social History:   Socioeconomic History:  Marital status: Married  Tobacco Use  Smoking status: Never  Smokeless tobacco: Never  Vaping Use  Vaping status: Never Used  Substance and Sexual Activity  Alcohol use: Yes  Alcohol/week: 0.0 standard drinks of alcohol  Drug use: Never  Sexual activity: Defer   Social Drivers of Health   Financial Resource Strain: Low Risk (05/10/2024)  Overall Financial Resource Strain (CARDIA)  Difficulty of Paying Living Expenses: Not hard at all  Food Insecurity: No Food Insecurity (05/10/2024)  Hunger Vital Sign  Worried About Running Out of Food in the Last Year: Never true  Ran Out of Food in the Last Year: Never true  Transportation Needs: No Transportation Needs (05/10/2024)  PRAPARE - Risk analyst (Medical): No  Lack of Transportation (Non-Medical): No   Review of Systems:  A comprehensive 14 point ROS was performed, reviewed,  and the pertinent orthopaedic findings are documented in the HPI.  Physical Exam: Vitals:  05/10/24 0824  BP: 130/70  Weight: 83.7 kg (184 lb 9.6 oz)  Height: 168.9 cm (5' 6.5)  PainSc: 2  PainLoc: Shoulder   General/Constitutional: The patient appears to be well-nourished, well-developed, and in no acute distress. Neuro/Psych: Normal mood and affect, oriented to person, place and time. Eyes: Non-icteric. Pupils are equal, round, and reactive to light, and exhibit synchronous movement. ENT: Unremarkable. Lymphatic: No palpable adenopathy. Respiratory: Lungs clear to auscultation, Normal chest excursion, No wheezes, and Non-labored breathing Cardiovascular: Regular rate and rhythm. No murmurs. and No edema, swelling or tenderness, except as noted in detailed exam. Integumentary: No impressive skin lesions present, except as noted in detailed exam. Musculoskeletal: Unremarkable, except as noted in detailed exam.  Right shoulder exam: SKIN: Normal SWELLING: None WARMTH: None LYMPH NODES: No adenopathy palpable CREPITUS: None TENDERNESS: Mild tenderness over anterior shoulder. ROM (active):  Forward flexion: 155 degrees Abduction: 150 degrees Internal rotation: Right PSIS ROM (passive):  Forward flexion: 160 degrees Abduction: 155 degrees ER/IR at 90 abd: 90 degrees/55 degrees  She describes minimal discomfort with all motions.  STRENGTH: Forward flexion: 4/5 Abduction: 4-4+/5 External rotation: 4-4+/5 Internal rotation: 4-4+/5 Pain with RC testing: She has mild-moderate pain with resisted forward flexion and mild pain with resisted external rotation.  STABILITY: Normal  SPECIAL TESTS: Vonzell' test: Moderately-positive Speed's test: Mildly positive Capsulitis - pain w/ passive ER: No Crossed arm test: Mildly positive Crank: Not evaluated Anterior apprehension: Negative Posterior apprehension: Not evaluated  She remains neurovascularly intact to the right upper  extremity.  Imaging: MRI OF THE RIGHT SHOULDER WITHOUT CONTRAST:  1. Significant rotator cuff tendinopathy/tendinosis. There is an  articular surface tear involving the supraspinatus tendon with  laminar retraction of the articular fibers estimated at 12 mm.  2. Full-thickness retracted tear involving the subscapularis tendon  from its humeral attachment site. Because of this the long head  biceps tendon is dislocated medially from the bicipital groove.  3. Intact long head biceps tendon and glenoid labrum. Moderate  tendinopathy involving the intra-articular portion of the biceps  tendon.  4. Mild AC joint degenerative changes and type 2 acromion but no  other significant findings for bony impingement.   Assessment: 1. Nontraumatic complete tear of right rotator cuff.  2. Rotator cuff tendinitis, right.  3. Tendinitis of upper biceps tendon of right shoulder.  4. Shoulder impingement.  5. Primary osteoarthritis of right shoulder.   Plan: 1. Treatment options were discussed today with the patient. 2. The patient is scheduled for a right shoulder arthroscopy with debridement, decompression, mini open rotator cuff repair and probable biceps tenodesis with Dr. Edie on 05/18/2024. 3. The patient was instructed on the risk and benefits of surgical intervention and wishes to proceed at this time. 4. This document will serve as a surgical history and physical for the patient. 5. The patient will follow-up per standard postop protocol. She can contact the clinic if she has any questions, new symptoms develop or symptoms worsen.  The procedure was discussed with the patient, as were  the potential risks (including bleeding, infection, nerve and/or blood vessel injury, persistent or recurrent pain, failure of the repair, progression of arthritis, need for further surgery, blood clots, strokes, heart attacks and/or arhythmias, pneumonia, etc.) and benefits. The patient states her understanding and  wishes to proceed.    H&P reviewed and patient re-examined. No changes.

## 2024-05-18 NOTE — Op Note (Signed)
 05/18/2024  11:47 AM  Patient:   Danielle Knapp  Pre-Op Diagnosis:   Impingement/tendinopathy with rotator cuff tear and biceps tendinopathy, right shoulder.  Post-Op Diagnosis:   Impingement/tendinopathy with rotator cuff tears, degenerative joint disease, degenerative labral fraying, and biceps tendinopathy, right shoulder.  Procedure:   Extensive arthroscopic debridement, arthroscopic repair of subscapularis tendon tear, arthroscopic subacromial decompression, mini-open repair of partial thickness of supraspinatus tear with Smith & Nephew Regeneten patch, and mini-open biceps tenodesis, right shoulder.  Anesthesia:   General endotracheal with interscalene block using Exparel  placed preoperatively by the anesthesiologist.  Surgeon:   DOROTHA Reyes Maltos, MD  Assistant:   Gustavo Level, PA-C; Dozier Cuba, PA-S  Findings:   As above. There was an essentially full-thickness tear involving the superior 40% of the insertional fibers of the subscapularis tendon, as well as an articular-sided partial-thickness tear of the anterior insertional fibers of the supraspinatus tendon involving approximately 40 to 50% of the footprint. The remainder of the rotator cuff was in satisfactory condition. There was moderate degenerative fraying of the anterior, superior, and posterior superior portions of the labrum without frank detachment from the glenoid rim. There were areas of grade III-IV chondromalacia involving the central articular portion of the humerus and areas of grade II-III chondromalacia involving the central portion of the glenoid. The biceps tendon had moderate partial-thickness tearing with medial subluxation into the torn subscapularis tendon.  Complications:   None  Estimated blood loss:   10 cc  Fluids:   500 cc  Tourniquet time:   None  Drains:   None  Closure:   Staples      Brief clinical note:   The patient is a 68 year old female with a long history of progressively  worsening right shoulder pain. The patient's symptoms have progressed despite medications, activity modification, etc. The patient's history and examination are consistent with impingement/tendinopathy with a rotator cuff tear. These findings were confirmed by MRI scan. The patient presents at this time for definitive management of these shoulder symptoms.  Procedure:   The patient underwent placement of an interscalene block using Exparel  by the anesthesiologist in the preoperative holding area before being brought into the operating room and lain in the supine position. The patient then underwent general endotracheal intubation and anesthesia before being repositioned in the beach chair position using the beach chair positioner. The right shoulder and upper extremity were prepped with ChloraPrep solution before being draped sterilely. Preoperative antibiotics were administered. A timeout was performed to confirm the proper surgical site before the expected portal sites and incision site were injected with 0.5% Sensorcaine  with epinephrine .   A posterior portal was created and the glenohumeral joint thoroughly inspected with the findings as described above. An anterior portal was created using an outside-in technique. The labrum and rotator cuff were further probed, again confirming the above-noted findings. The areas of labral fraying were debrided back to stable margins using the full-radius resector. The full-radius dissector also was used to debride the loose articular cartilage from the areas of degenerative joint disease involving the humeral head and glenoid, as well as to debride the torn portions of the rotator cuff tears. Finally, it was used to debride areas of synovitis anteriorly and superiorly. The ArthroCare wand was inserted and used to release the biceps tendon from its labral anchor. It also was used to obtain hemostasis as well as to anneal the labrum superiorly and anteriorly.   The torn  portion of the subscapularis tendon was repaired arthroscopically  at this point. A separate superolateral portal site was created using an outside in technique to serve as a working portal. The exposed portion of the lesser tuberosity was lightly abraded with a full-radius resector. The subscapularis tendon was repaired using two Smith & Nephew 1.8 mm Q-Fix knotless anchors placed through the anterior portal. The adequacy of repair was verified both by probing and with gentle passive external rotation of the shoulder. The instruments were removed from the joint after suctioning the excess fluid.  The camera was repositioned through the posterior portal into the subacromial space. A separate lateral portal was created using an outside-in technique. The 3.5 mm full-radius resector was introduced and used to perform a subtotal bursectomy. The ArthroCare wand was then inserted and used to remove the periosteal tissue off the undersurface of the anterior third of the acromion as well as to recess the coracoacromial ligament from its attachment along the anterior and lateral margins of the acromion. The 4.0 mm acromionizing bur was introduced and used to complete the decompression by removing the undersurface of the anterior third of the acromion. The full radius resector was reintroduced to remove any residual bony debris before the ArthroCare wand was reintroduced to obtain hemostasis. The instruments were then removed from the subacromial space after suctioning the excess fluid.  An approximately 4-5 cm incision was made over the anterolateral aspect of the shoulder beginning at the anterolateral corner of the acromion and extending distally in line with the bicipital groove. This incision was carried down through the subcutaneous tissues to expose the deltoid fascia. The raphae between the anterior and middle thirds was identified and this plane developed to provide access into the subacromial space. Additional  bursal tissues were debrided sharply using Metzenbaum scissors. The bursal surface of the rotator cuff was in excellent condition. The site of the articular sided tear involving the anterior insertional fibers of the supraspinatus tendon was identified by palpation.  The bicipital groove was identified by palpation and opened for 1-1.5 cm. The biceps tendon stump was retrieved through this defect. The floor of the bicipital groove was roughened with a curet before a Biomet 2.9 mm JuggerKnot anchor was inserted. Both sets of sutures were passed through the biceps tendon and tied securely to effect the tenodesis. The bicipital sheath was reapproximated using two #0 Ethibond interrupted sutures, incorporating the biceps tendon to further reinforce the tenodesis.  Given that the rotator cuff tear was articular sided and involves less than 50% of the footprint, it was felt that application of a Smith & Nephew Regeneten patch to the tear would suffice. Therefore, a medium-sized patch was selected and applied over the rotator cuff defect, then secured using the appropriate bone and soft tissue staples.  The wound was copiously irrigated with sterile saline solution before the deltoid raphae was reapproximated using 2-0 Vicryl interrupted sutures. The subcutaneous tissues were closed in two layers using 2-0 Vicryl interrupted sutures before the skin was closed using staples. The portal sites also were closed using staples. A sterile bulky dressing was applied to the shoulder before the arm was placed into a shoulder immobilizer. The patient was then awakened, extubated, and returned to the recovery room in satisfactory condition after tolerating the procedure well.

## 2024-05-18 NOTE — Anesthesia Procedure Notes (Signed)
 Procedure Name: Intubation Date/Time: 05/18/2024 9:57 AM  Performed by: Jackye Spanner, CRNAPre-anesthesia Checklist: Patient identified, Patient being monitored, Timeout performed, Emergency Drugs available and Suction available Patient Re-evaluated:Patient Re-evaluated prior to induction Oxygen Delivery Method: Circle system utilized Preoxygenation: Pre-oxygenation with 100% oxygen Induction Type: IV induction Ventilation: Mask ventilation without difficulty Laryngoscope Size: 3 and McGrath Grade View: Grade I Tube type: Oral Tube size: 7.0 mm Number of attempts: 1 Airway Equipment and Method: Stylet Placement Confirmation: ETT inserted through vocal cords under direct vision, positive ETCO2 and breath sounds checked- equal and bilateral Secured at: 21 cm Tube secured with: Tape Dental Injury: Teeth and Oropharynx as per pre-operative assessment  Comments: Smooth atraumatic intubation, no complications noted.

## 2024-05-18 NOTE — Anesthesia Procedure Notes (Signed)
 Anesthesia Regional Block: Interscalene brachial plexus block   Pre-Anesthetic Checklist: , timeout performed,  Correct Patient, Correct Site, Correct Laterality,  Correct Procedure, Correct Position, site marked,  Risks and benefits discussed,  Surgical consent,  Pre-op evaluation,  At surgeon's request and post-op pain management  Laterality: Right  Prep: chloraprep       Needles:  Injection technique: Single-shot  Needle Type: Echogenic Needle     Needle Length: 4cm  Needle Gauge: 25     Additional Needles:   Procedures:,,,, ultrasound used (permanent image in chart),,    Narrative:  Start time: 05/18/2024 9:25 AM End time: 05/18/2024 9:28 AM Injection made incrementally with aspirations every 5 mL.  Performed by: Personally  Anesthesiologist: Leavy Ned, MD  Additional Notes: Patient's chart reviewed and they were deemed appropriate candidate for procedure, at surgeon's request. Patient educated about risks, benefits, and alternatives of the block including but not limited to: temporary or permanent nerve damage, bleeding, infection, damage to surround tissues, pneumothorax, hemidiaphragmatic paralysis, unilateral Horner's syndrome, block failure, local anesthetic toxicity. Patient expressed understanding. A formal time-out was conducted consistent with institution rules.  Monitors were applied, and minimal sedation used (see nursing record). The site was prepped with skin prep and allowed to dry, and sterile gloves were used. A high frequency linear ultrasound probe with probe cover was utilized throughout. C5-7 nerve roots located and appeared anatomically normal, local anesthetic injected around them, and echogenic block needle trajectory was monitored throughout. Aspiration performed every 5ml. Lung and blood vessels were avoided. All injections were performed without resistance and free of blood and paresthesias. The patient tolerated the procedure well.  Injectate:  20ml exparel  + 10ml 0.5% bupivacaine 

## 2024-05-18 NOTE — Discharge Instructions (Addendum)
 Orthopedic discharge instructions: Keep dressing dry and intact.  May shower after dressing changed on post-op day #4 (Saturday).  Cover staples/sutures with Band-Aids after drying off. Apply ice frequently to shoulder or use Polar Care device. Take ibuprofen  600 mg TID with meals for 3-5 days, then as necessary. Take oxycodone  as prescribed when needed.  May supplement with ES Tylenol  if necessary. Keep shoulder immobilizer on at all times except may remove for bathing purposes. Follow-up in 10-14 days or as scheduled.

## 2024-05-19 ENCOUNTER — Encounter: Payer: Self-pay | Admitting: Surgery

## 2024-05-24 DIAGNOSIS — M25511 Pain in right shoulder: Secondary | ICD-10-CM | POA: Diagnosis not present

## 2024-05-24 DIAGNOSIS — G8929 Other chronic pain: Secondary | ICD-10-CM | POA: Diagnosis not present

## 2024-05-31 DIAGNOSIS — G8929 Other chronic pain: Secondary | ICD-10-CM | POA: Diagnosis not present

## 2024-05-31 DIAGNOSIS — M25511 Pain in right shoulder: Secondary | ICD-10-CM | POA: Diagnosis not present

## 2024-06-01 DIAGNOSIS — E663 Overweight: Secondary | ICD-10-CM | POA: Diagnosis not present

## 2024-06-01 DIAGNOSIS — F411 Generalized anxiety disorder: Secondary | ICD-10-CM | POA: Diagnosis not present

## 2024-06-01 DIAGNOSIS — Z853 Personal history of malignant neoplasm of breast: Secondary | ICD-10-CM | POA: Diagnosis not present

## 2024-06-01 DIAGNOSIS — Z8542 Personal history of malignant neoplasm of other parts of uterus: Secondary | ICD-10-CM | POA: Diagnosis not present

## 2024-06-01 DIAGNOSIS — I10 Essential (primary) hypertension: Secondary | ICD-10-CM | POA: Diagnosis not present

## 2024-06-07 DIAGNOSIS — M25511 Pain in right shoulder: Secondary | ICD-10-CM | POA: Diagnosis not present

## 2024-06-07 DIAGNOSIS — G8929 Other chronic pain: Secondary | ICD-10-CM | POA: Diagnosis not present

## 2024-06-08 ENCOUNTER — Other Ambulatory Visit: Payer: Self-pay | Admitting: Urology

## 2024-06-09 DIAGNOSIS — N200 Calculus of kidney: Secondary | ICD-10-CM | POA: Diagnosis not present

## 2024-06-14 DIAGNOSIS — M25511 Pain in right shoulder: Secondary | ICD-10-CM | POA: Diagnosis not present

## 2024-06-14 DIAGNOSIS — G8929 Other chronic pain: Secondary | ICD-10-CM | POA: Diagnosis not present

## 2024-06-20 NOTE — Patient Instructions (Signed)
 SURGICAL WAITING ROOM VISITATION Patients having surgery or a procedure may have no more than 2 support people in the waiting area - these visitors may rotate in the visitor waiting room.   If the patient needs to stay at the hospital during part of their recovery, the visitor guidelines for inpatient rooms apply.  PRE-OP VISITATION  Pre-op nurse will coordinate an appropriate time for 1 support person to accompany the patient in pre-op.  This support person may not rotate.  This visitor will be contacted when the time is appropriate for the visitor to come back in the pre-op area.  Please refer to the [facility] website for the visitor guidelines for Inpatients (after your surgery is over and you are in a regular room).  You are not required to quarantine at this time prior to your surgery. However, you must do this: Hand Hygiene often Do NOT share personal items Notify your provider if you are in close contact with someone who has COVID or you develop fever 100.4 or greater, new onset of sneezing, cough, sore throat, shortness of breath or body aches.  If you test positive for Covid or have been in contact with anyone that has tested positive in the last 10 days please notify you surgeon.    Your procedure is scheduled on:  THURSDAY  July 08, 2024  Report to [facility] Main Entrance: [address] entrance where the [employer] is available.   Report to admitting at: 05:15    AM  Call this number if you have any questions or problems the morning of surgery [phone]  DO NOT EAT OR DRINK ANYTHING AFTER MIDNIGHT THE NIGHT PRIOR TO YOUR SURGERY / PROCEDURE.   FOLLOW  ANY ADDITIONAL PRE OP INSTRUCTIONS YOU RECEIVED FROM YOUR SURGEON'S OFFICE!!!   Oral Hygiene is also important to reduce your risk of infection.        Remember - BRUSH YOUR TEETH THE MORNING OF SURGERY WITH YOUR REGULAR TOOTHPASTE  Do NOT smoke after Midnight the night before surgery.  STOP TAKING all  Vitamins, Herbs and supplements 1 week before your surgery.   Take ONLY these medicines the morning of surgery with A SIP OF WATER : Sertraline , bupropion , pantoprazole, amlodipine , and oxycodone  if needed for pain.  You may use your Flonase nasal spray if needed.    DO NOT TAKE VALSARTAN the morning of your surgery.   You may not have any metal on your body including hair pins, jewelry, and body piercing  Do not wear make-up, lotions, powders, perfumes or deodorant  Do not wear nail polish including gel and S&S, artificial / acrylic nails, or any other type of covering on natural nails including finger and toenails. If you have artificial nails, gel coating, etc., that needs to be removed by a nail salon, Please have this removed prior to surgery. Not doing so may mean that your surgery could be cancelled or delayed if the Surgeon or anesthesia staff feels like they are unable to monitor you safely.   Do not shave 48 hours prior to surgery to avoid nicks in your skin which may contribute to postoperative infections.   Contacts, Hearing Aids, dentures or bridgework may not be worn into surgery. DENTURES WILL BE REMOVED PRIOR TO SURGERY PLEASE DO NOT APPLY Poly grip OR ADHESIVES!!!  Patients discharged on the day of surgery will not be allowed to drive home.  Someone NEEDS to stay with you for the first 24 hours after anesthesia.  Do  not bring your home medications to the hospital. The Pharmacy will dispense medications listed on your medication list to you during your admission in the Hospital.  Please read over the following fact sheets you were given: IF YOU HAVE QUESTIONS ABOUT YOUR PRE-OP INSTRUCTIONS, PLEASE CALL [phone].   [place] - Preparing for Surgery      Before surgery, you can play an important role.  Because skin is not sterile, your skin needs to be as free of germs as possible.  You can reduce the number of germs on your skin by washing with CHG (chlorahexidine  gluconate) soap before surgery.  CHG is an antiseptic cleaner which kills germs and bonds with the skin to continue killing germs even after washing. Please DO NOT use if you have an allergy to CHG or antibacterial soaps.  If your skin becomes reddened/irritated stop using the CHG and inform your nurse when you arrive at Short Stay. Do not shave (including legs and underarms) for at least 48 hours prior to the first CHG shower.  You may shave your face/neck.  Please follow these instructions carefully:  1.  Shower with CHG Soap the night before surgery ONLY (DO NOT USE THE CHG SOAP THE MORNING OF SURGERY).  2.  If you choose to wash your hair, wash your hair first as usual with your normal  shampoo.  3.  After you shampoo, rinse your hair and body thoroughly to remove the shampoo.                             4.  Use CHG as you would any other liquid soap.  You can apply chg directly to the skin and wash.  Gently with a scrungie or clean washcloth.  5.  Apply the CHG Soap to your body ONLY FROM THE NECK DOWN.   Do not use on face/ open                           Wound or open sores. Avoid contact with eyes, ears mouth and genitals (private parts).                       Wash face,  Genitals (private parts) with your normal soap.             6.  Wash thoroughly, paying special attention to the area where your  surgery  will be performed.  7.  Thoroughly rinse your body with warm water  from the neck down.  8.  DO NOT shower/wash with your normal soap after using and rinsing off the CHG Soap.                9.  Pat yourself dry with a clean towel.            10.  Wear clean pajamas.            11.  Place clean sheets on your bed the night of your first shower and do not  sleep with pets.  Day of Surgery : Do not apply any CHG, lotions/deodorants the morning of surgery.  Please wear clean clothes to the hospital/surgery center.   FAILURE TO FOLLOW THESE INSTRUCTIONS MAY RESULT IN THE CANCELLATION OF  YOUR SURGERY  PATIENT SIGNATURE_________________________________  NURSE SIGNATURE__________________________________  ________________________________________________________________________

## 2024-06-20 NOTE — Progress Notes (Signed)
 COVID Vaccine received:  []  No [x]  Yes Date of any COVID positive Test in last 90 days:  PCP - Montie Pizza, MD at [facility]  Cardiologist - Maude Emmer, MD (LOV 11-07-2017) patient to see prn  Chest x-ray - 11-14-2021  2v  EKG -  05-19-2024   Stress Test -  ECHO - 10-16-2017  Cardiac Cath - LHC / CORS 11-03-2017 by Dr. Dann,  CT Coronary Calcium  score:   Pacemaker / ICD device [x]  No []  Yes   Spinal Cord Stimulator:[x]  No []  Yes       History of Sleep Apnea? []  No [x]  Yes   CPAP used?- [x]  No []  Yes    Patient has: [x]  NO Hx DM   []  Pre-DM   []  DM1  []   DM2 Does the patient monitor blood sugar?   [x]  N/A   []  No []  Yes   Blood Thinner / Instructions: none Aspirin  Instructions:  none  Dental hx: []  Dentures:  []  N/A      []  Bridge or Partial:                   []  Loose or Damaged teeth:   Comments:   Activity level: Able to walk up 2 flights of stairs without becoming significantly short of breath or having chest pain?  []  No   []    Yes  Patient can perform ADLs without assistance. []  No   []   Yes  Anesthesia review: mild nonobstructive CAD - LHC 2019, HTN, Polycythemia, Hx cancer- endometrial, breast, and bladder,   s/p Right rotator cuff repair at [facility] 05-18-24, MDD  Patient denies any S&S of respiratory illness or Covid - no shortness of breath, fever, cough or chest pain at PAT appointment.  Patient verbalized understanding and agreement to the Pre-Surgical Instructions that were given to them at this PAT appointment. Patient was also educated of the need to review these PAT instructions again prior to her surgery.I reviewed the appropriate phone numbers to call if they have any and questions or concerns.

## 2024-06-21 ENCOUNTER — Encounter (HOSPITAL_COMMUNITY): Payer: Self-pay

## 2024-06-21 ENCOUNTER — Other Ambulatory Visit: Payer: Self-pay

## 2024-06-21 ENCOUNTER — Encounter (HOSPITAL_COMMUNITY)
Admission: RE | Admit: 2024-06-21 | Discharge: 2024-06-21 | Disposition: A | Discharge status: Home / Self Care | Source: Ambulatory Visit | Attending: Urology | Admitting: Urology

## 2024-06-21 VITALS — BP 116/79 | HR 68 | Temp 98.1°F | Resp 20 | Ht 66.0 in | Wt 184.0 lb

## 2024-06-21 DIAGNOSIS — Z853 Personal history of malignant neoplasm of breast: Secondary | ICD-10-CM | POA: Insufficient documentation

## 2024-06-21 DIAGNOSIS — Z9221 Personal history of antineoplastic chemotherapy: Secondary | ICD-10-CM | POA: Insufficient documentation

## 2024-06-21 DIAGNOSIS — Z8551 Personal history of malignant neoplasm of bladder: Secondary | ICD-10-CM | POA: Insufficient documentation

## 2024-06-21 DIAGNOSIS — N200 Calculus of kidney: Secondary | ICD-10-CM | POA: Diagnosis not present

## 2024-06-21 DIAGNOSIS — I10 Essential (primary) hypertension: Secondary | ICD-10-CM | POA: Insufficient documentation

## 2024-06-21 DIAGNOSIS — Z01812 Encounter for preprocedural laboratory examination: Secondary | ICD-10-CM | POA: Insufficient documentation

## 2024-06-21 DIAGNOSIS — Z01818 Encounter for other preprocedural examination: Secondary | ICD-10-CM

## 2024-06-21 DIAGNOSIS — D751 Secondary polycythemia: Secondary | ICD-10-CM | POA: Insufficient documentation

## 2024-06-21 DIAGNOSIS — I2510 Atherosclerotic heart disease of native coronary artery without angina pectoris: Secondary | ICD-10-CM | POA: Insufficient documentation

## 2024-06-21 DIAGNOSIS — Z8616 Personal history of COVID-19: Secondary | ICD-10-CM | POA: Diagnosis not present

## 2024-06-21 LAB — COMPREHENSIVE METABOLIC PANEL WITH GFR
ALT: 23 U/L (ref 0–44)
AST: 22 U/L (ref 15–41)
Albumin: 4.5 g/dL (ref 3.5–5.0)
Alkaline Phosphatase: 72 U/L (ref 38–126)
Anion gap: 10 (ref 5–15)
BUN: 18 mg/dL (ref 8–23)
CO2: 25 mmol/L (ref 22–32)
Calcium: 9.6 mg/dL (ref 8.9–10.3)
Chloride: 105 mmol/L (ref 98–111)
Creatinine, Ser: 0.67 mg/dL (ref 0.44–1.00)
GFR, Estimated: >60 mL/min (ref >60)
Glucose, Bld: 103 mg/dL — ABNORMAL HIGH (ref 70–99)
Potassium: 4.3 mmol/L (ref 3.5–5.1)
Sodium: 140 mmol/L (ref 135–145)
Total Bilirubin: 0.4 mg/dL (ref 0.0–1.2)
Total Protein: 7 g/dL (ref 6.5–8.1)

## 2024-06-21 LAB — CBC
HCT: 45.5 % (ref 36.0–46.0)
Hemoglobin: 15.6 g/dL — ABNORMAL HIGH (ref 12.0–15.0)
MCH: 30.5 pg (ref 26.0–34.0)
MCHC: 34.3 g/dL (ref 30.0–36.0)
MCV: 88.9 fL (ref 80.0–100.0)
Platelets: 261 K/uL (ref 150–400)
RBC: 5.12 MIL/uL — ABNORMAL HIGH (ref 3.87–5.11)
RDW: 12.8 % (ref 11.5–15.5)
WBC: 7 K/uL (ref 4.0–10.5)
nRBC: 0 % (ref 0.0–0.2)

## 2024-06-22 ENCOUNTER — Encounter (HOSPITAL_COMMUNITY): Payer: Self-pay | Admitting: Physician Assistant

## 2024-06-22 DIAGNOSIS — M25511 Pain in right shoulder: Secondary | ICD-10-CM | POA: Diagnosis not present

## 2024-06-22 DIAGNOSIS — G8929 Other chronic pain: Secondary | ICD-10-CM | POA: Diagnosis not present

## 2024-06-22 NOTE — Progress Notes (Signed)
 Anesthesia Chart Review   Case: [id number] Date/Time: 07/08/24 0715   Procedure: CYSTOSCOPY/URETEROSCOPY/HOLMIUM LASER/STENT PLACEMENT (Right) - CYSTOSCOPY/RIGHT URETEROSCOPY/HOLMIUM LASER/STENT PLACEMENT/RETROGRADE PYELOGRAM   Anesthesia type: General   Diagnosis: Kidney stone [N20.0]   Pre-op diagnosis: RIGHT RENAL CALCULI   Location: [facility]   Surgeons: Cam Morene ORN, MD       DISCUSSION:68 y.o. never smoker with h/o HTN, breast cancer with secondary polycythemia now under surveillance, CAD, bladder cancer, right renal calculi scheduled for above procedure 07/08/2024 with Dr. Morene Cam.   Mild nonobstructive CAD on LHC 2019.   Pt reports she can climb a flight of stairs without chest pain or shortness of breath.   S/p right shoulder arthroscopy 05/18/2024 with no anesthesia complications noted.  VS: BP 116/79 Comment: left arm sitting  Pulse 68   Temp 36.7 C (Oral)   Resp 20   Ht 5' 6 (1.676 m)   Wt 83.5 kg   LMP 09/03/2011   SpO2 99%   BMI 29.70 kg/m   PROVIDERS: Teresa Channel, MD is PCP    LABS: Labs reviewed: Acceptable for surgery. (all labs ordered are listed, but only abnormal results are displayed)  Labs Reviewed  COMPREHENSIVE METABOLIC PANEL WITH GFR - Abnormal; Notable for the following components:      Result Value   Glucose, Bld 103 (*)    All other components within normal limits  CBC - Abnormal; Notable for the following components:   RBC 5.12 (*)    Hemoglobin 15.6 (*)    All other components within normal limits     IMAGES:   EKG:   CV: Cardiac Cath 11/03/2017 Prox LAD lesion is 10% stenosed. Mid Cx lesion is 10% stenosed. Dist RCA lesion is 10% stenosed. The left ventricular systolic function is normal. LV end diastolic pressure is normal. The left ventricular ejection fraction is 55-65% by visual estimate. There is no aortic valve stenosis.   Minimal nonobstructive CAD.  Investigate other causes of  chest pain.  Echo 10/16/2017 - Left ventricle: The cavity size was normal. Wall thickness was    normal. Systolic function was normal. The estimated ejection    fraction was in the range of 60% to 65%. Wall motion was normal;    there were no regional wall motion abnormalities. Doppler    parameters are consistent with abnormal left ventricular    relaxation (grade 1 diastolic dysfunction). The E/e&' ratio is    between 8-15, suggesting indeterminate LV filling pressure.  - Mitral valve: Mildly thickened leaflets . There was trivial    regurgitation.  - Left atrium: The atrium was normal in size.  - Right ventricle: The cavity size was normal. Wall thickness was    normal. Systolic function was normal.  - Right atrium: The atrium was normal in size.  - Inferior vena cava: The vessel was dilated. The respirophasic    diameter changes were blunted (< 50%), consistent with elevated    central venous pressure.  Past Medical History:  Diagnosis Date   Allergic rhinitis    Allergy    Anxiety    Bladder cancer (HCC) 10/17/2020   Borderline glaucoma, left    no eye drops   BPPV (benign paroxysmal positional vertigo)    Breast cancer (HCC) 01/23/2021   Cancer (HCC) 10/17/2020   Bladder and Endomitrial cancer in addition to breast cancer   Cataract    Chondromalacia of patella    left   Coronary artery disease  cardiac cath 11-03-2017 done for positive ETT,  showed very miminal nonobstrutive cad, ef 55-65%, non caridac chest pain   Diverticulosis of colon    Endometrial cancer (HCC)    Family history of breast cancer 12/08/2020   Family history of ovarian cancer 12/08/2020   Family history of prostate cancer 12/08/2020   Frequency of urination    GERD (gastroesophageal reflux disease)    History of chest pain 10/14/2017   ED visit w/ admission,  hypertensive urgency;  follow up with dr claudene--- had outpatient ETT 10-21-2017 positive ischemia, echo 10-16-2017 G1DD, 60-65%;    s/p  cardiac cath 11-03-2017 w/ very miminal nonobstructive cad , chest pain non cardiac   History of COVID-19 06/01/2021   History of diverticulitis    History of kidney stones    HSV-1 infection    fever blisters   Hx of adenomatous colonic polyps 02/08/2018   Hypertension    followed by pcp   MDD (major depressive disorder)    Myocardial infarction (HCC) 2019   minor MI   OA (osteoarthritis)    left knee, shoulder   Osteoporosis    Polycythemia    long hx followed by pcp and  sees hematology/ oncology--- dr christella. sherrod on as needed basis, chronic and told to donate blood regularly   Rotator cuff tear 02/2023   right   Sleep apnea    pt states minimal sleep apnea that did not require cpap   Stenosis of cervix    Uterine mass    Wears glasses     Past Surgical History:  Procedure Laterality Date   ABDOMINAL HYSTERECTOMY     ACHILLES TENDON SURGERY Left 03/07/2023   Procedure: ACHILLES TENDON REPAIR - SECONDARY;  Surgeon: Ashley Soulier, DPM;  Location: [facility];  Service: Orthopedics/Podiatry;  Laterality: Left;   ARTHOSCOPIC ROTAOR CUFF REPAIR Right 05/18/2024   Procedure: REPAIR, ROTATOR CUFF, ARTHROSCOPIC;  Surgeon: Edie Norleen PARAS, MD;  Location: [facility];  Service: Orthopedics;  Laterality: Right;   BICEPT TENODESIS Right 05/18/2024   Procedure: TENODESIS, BICEPS;  Surgeon: Edie Norleen PARAS, MD;  Location: [facility];  Service: Orthopedics;  Laterality: Right;   BREAST LUMPECTOMY WITH RADIOACTIVE SEED LOCALIZATION Right 02/16/2021   Procedure: RIGHT BREAST LUMPECTOMY WITH RADIOACTIVE SEED LOCALIZATION;  Surgeon: Vernetta Berg, MD;  Location: [facility];  Service: General;  Laterality: Right;   CARDIAC CATHETERIZATION     CATARACT EXTRACTION W/ INTRAOCULAR LENS  IMPLANT, BILATERAL  2013   COLONOSCOPY  02-02-2018 dr avram   CYSTOSCOPY  10/17/2020   Procedure: CYSTOSCOPY;  Surgeon: Cathlyn JAYSON Nikki Bobie FORBES, MD;  Location: [facility];  Service:  Gynecology;;   CYSTOSCOPY N/A 11/09/2020   Procedure: PHYLLIS;  Surgeon: Rosalind Zachary NOVAK, MD;  Location: [facility];  Service: Urology;  Laterality: N/A;  Dr. Rosalind needs to go first. [name] and rigid bx forceps.   CYSTOSCOPY/RETROGRADE/URETEROSCOPY Left 10/21/2023   Procedure: CYSTOSCOPY/RETROGRADE/URETEROSCOPY;  Surgeon: Watt Norleen, MD;  [facility];  Service: Urology;  Laterality: Left;   DIAGNOSTIC LAPAROSCOPY  1996   DILATATION & CURETTAGE/HYSTEROSCOPY WITH MYOSURE N/A 10/17/2020   Procedure: DILATATION & CURETTAGE/HYSTEROSCOPY WITH  MYOSURE RESECTION OF ENDOMETRIAL MASS,  OPENING OF CERVICAL STENOSIS, DRAINAGE OF UTERINE FLUID;  Surgeon: Cathlyn JAYSON Nikki Bobie FORBES, MD;  Location: [facility];  Service: Gynecology;  Laterality: N/A;   EYE SURGERY     FLEXOR TENOTOMY  Left 03/07/2023   Procedure: 72341 - FLEXOR TENDON REPAIR;  Surgeon:  Ashley Soulier, DPM;  Location: [facility];  Service: Orthopedics/Podiatry;  Laterality: Left;   FRACTURE SURGERY     HEEL SPUR SURGERY Right 2015   Dr. Anderson   HEMORRHOID SURGERY  2004   LEFT HEART CATH AND CORONARY ANGIOGRAPHY N/A 11/03/2017   Procedure: LEFT HEART CATH AND CORONARY ANGIOGRAPHY;  Surgeon: Dann Candyce RAMAN, MD;  Location: [facility] INVASIVE CV [facility];  Service: Cardiovascular;  Laterality: N/A;   LYMPH NODE DISSECTION N/A 11/09/2020   Procedure: BILATERAL LYMPH NODE DISSECTION;MINI LAPAROTOMY;  Surgeon: Viktoria Comer SAUNDERS, MD;  Location[facility];  Service: Gynecology;  Laterality: N/A;   OPERATIVE ULTRASOUND N/A 10/17/2020   Procedure: OPERATIVE ULTRASOUND;  Surgeon: Cathlyn JAYSON Nikki Bobie FORBES, MD;  Location: [facility];  Service: Gynecology;  Laterality: N/A;   OSTECTOMY Left 03/07/2023   Procedure: HAGLUNDS RETROCALCANEAL OSTECTOMY;  Surgeon: Ashley Soulier, DPM;  Location: [facility];  Service: Orthopedics/Podiatry;  Laterality: Left;   POSTERIOR LUMBAR FUSION 2 WITH HARDWARE REMOVAL Right 05/18/2024    Procedure: ARTHROSCOPY, SHOULDER WITH DEBRIDEMENT;  Surgeon: Edie Norleen PARAS, MD;  Location: [facility];  Service: Orthopedics;  Laterality: Right;   ROBOTIC ASSISTED TOTAL HYSTERECTOMY WITH BILATERAL SALPINGO OOPHERECTOMY Bilateral 11/09/2020   Procedure: XI ROBOTIC ASSISTED TOTAL HYSTERECTOMY WITH BILATERAL SALPINGO OOPHORECTOMY,;  Surgeon: Viktoria Comer SAUNDERS, MD;  Location[facility];  Service: Gynecology;  Laterality: Bilateral;   SEPTOPLASTY  x2 1970's   SHOULDER OPEN ROTATOR CUFF REPAIR Right 05/18/2024   Procedure: REPAIR, ROTATOR CUFF, OPEN;  Surgeon: Edie Norleen PARAS, MD;  Location: [facility];  Service: Orthopedics;  Laterality: Right;   SUBACROMIAL DECOMPRESSION Right 05/18/2024   Procedure: DECOMPRESSION, SUBACROMIAL SPACE;  Surgeon: Edie Norleen PARAS, MD;  Location: [facility];  Service: Orthopedics;  Laterality: Right;   TONSILLECTOMY  age 89   TRANSURETHRAL RESECTION OF BLADDER TUMOR N/A 11/09/2020   Procedure: TRANSURETHRAL RESECTION OF BLADDER TUMOR (TURBT);  Surgeon: Rosalind Zachary NOVAK, MD;  Location[facility];  Service: Urology;  Laterality: N/A;    MEDICATIONS:  amLODipine  (NORVASC ) 5 MG tablet   anastrozole  (ARIMIDEX ) 1 MG tablet   ascorbic acid (VITAMIN C) 500 MG tablet   buPROPion  (WELLBUTRIN  XL) 300 MG 24 hr tablet   COLLAGEN PO   fluticasone (FLONASE) 50 MCG/ACT nasal spray   Multiple Minerals-Vitamins (CAL MAG ZINC +D3 PO)   Multiple Vitamin (MULTIVITAMIN WITH MINERALS) TABS tablet   oxyCODONE  (ROXICODONE ) 5 MG immediate release tablet   pantoprazole (PROTONIX) 40 MG tablet   sertraline  (ZOLOFT ) 100 MG tablet   valACYclovir  (VALTREX ) 1000 MG tablet   valsartan (DIOVAN) 320 MG tablet   zolpidem  (AMBIEN ) 5 MG tablet   No current facility-administered medications for this encounter.   Harlene Hoots Ward, PA-C [facility] [phone]

## 2024-06-28 DIAGNOSIS — G8929 Other chronic pain: Secondary | ICD-10-CM | POA: Diagnosis not present

## 2024-06-28 DIAGNOSIS — M25511 Pain in right shoulder: Secondary | ICD-10-CM | POA: Diagnosis not present

## 2024-07-01 ENCOUNTER — Emergency Department

## 2024-07-01 ENCOUNTER — Inpatient Hospital Stay
Admission: EM | Admit: 2024-07-01 | Discharge: 2024-07-04 | DRG: 872 | Disposition: A | Discharge status: Home / Self Care | Attending: Internal Medicine | Admitting: Internal Medicine

## 2024-07-01 ENCOUNTER — Encounter: Payer: Self-pay | Admitting: Emergency Medicine

## 2024-07-01 DIAGNOSIS — Z821 Family history of blindness and visual loss: Secondary | ICD-10-CM

## 2024-07-01 DIAGNOSIS — N12 Tubulo-interstitial nephritis, not specified as acute or chronic: Secondary | ICD-10-CM | POA: Insufficient documentation

## 2024-07-01 DIAGNOSIS — I2510 Atherosclerotic heart disease of native coronary artery without angina pectoris: Secondary | ICD-10-CM | POA: Diagnosis present

## 2024-07-01 DIAGNOSIS — Z853 Personal history of malignant neoplasm of breast: Secondary | ICD-10-CM

## 2024-07-01 DIAGNOSIS — K219 Gastro-esophageal reflux disease without esophagitis: Secondary | ICD-10-CM | POA: Diagnosis not present

## 2024-07-01 DIAGNOSIS — Z9889 Other specified postprocedural states: Secondary | ICD-10-CM

## 2024-07-01 DIAGNOSIS — Z833 Family history of diabetes mellitus: Secondary | ICD-10-CM

## 2024-07-01 DIAGNOSIS — Z79899 Other long term (current) drug therapy: Secondary | ICD-10-CM

## 2024-07-01 DIAGNOSIS — I10 Essential (primary) hypertension: Secondary | ICD-10-CM | POA: Diagnosis not present

## 2024-07-01 DIAGNOSIS — M1712 Unilateral primary osteoarthritis, left knee: Secondary | ICD-10-CM | POA: Diagnosis present

## 2024-07-01 DIAGNOSIS — N10 Acute tubulo-interstitial nephritis: Secondary | ICD-10-CM | POA: Diagnosis present

## 2024-07-01 DIAGNOSIS — A4151 Sepsis due to Escherichia coli [E. coli]: Secondary | ICD-10-CM | POA: Diagnosis not present

## 2024-07-01 DIAGNOSIS — A4150 Gram-negative sepsis, unspecified: Secondary | ICD-10-CM | POA: Diagnosis present

## 2024-07-01 DIAGNOSIS — Z8262 Family history of osteoporosis: Secondary | ICD-10-CM

## 2024-07-01 DIAGNOSIS — F419 Anxiety disorder, unspecified: Secondary | ICD-10-CM | POA: Diagnosis present

## 2024-07-01 DIAGNOSIS — G4730 Sleep apnea, unspecified: Secondary | ICD-10-CM | POA: Diagnosis present

## 2024-07-01 DIAGNOSIS — A419 Sepsis, unspecified organism: Secondary | ICD-10-CM | POA: Diagnosis not present

## 2024-07-01 DIAGNOSIS — Z888 Allergy status to other drugs, medicaments and biological substances status: Secondary | ICD-10-CM

## 2024-07-01 DIAGNOSIS — Z881 Allergy status to other antibiotic agents status: Secondary | ICD-10-CM

## 2024-07-01 DIAGNOSIS — Z8041 Family history of malignant neoplasm of ovary: Secondary | ICD-10-CM

## 2024-07-01 DIAGNOSIS — Z9842 Cataract extraction status, left eye: Secondary | ICD-10-CM

## 2024-07-01 DIAGNOSIS — Z823 Family history of stroke: Secondary | ICD-10-CM

## 2024-07-01 DIAGNOSIS — Z87892 Personal history of anaphylaxis: Secondary | ICD-10-CM

## 2024-07-01 DIAGNOSIS — N200 Calculus of kidney: Secondary | ICD-10-CM | POA: Diagnosis present

## 2024-07-01 DIAGNOSIS — H8110 Benign paroxysmal vertigo, unspecified ear: Secondary | ICD-10-CM | POA: Diagnosis present

## 2024-07-01 DIAGNOSIS — H40002 Preglaucoma, unspecified, left eye: Secondary | ICD-10-CM | POA: Diagnosis present

## 2024-07-01 DIAGNOSIS — Z1152 Encounter for screening for COVID-19: Secondary | ICD-10-CM

## 2024-07-01 DIAGNOSIS — Z9841 Cataract extraction status, right eye: Secondary | ICD-10-CM

## 2024-07-01 DIAGNOSIS — R0602 Shortness of breath: Secondary | ICD-10-CM | POA: Diagnosis not present

## 2024-07-01 DIAGNOSIS — Z9089 Acquired absence of other organs: Secondary | ICD-10-CM

## 2024-07-01 DIAGNOSIS — Z90710 Acquired absence of both cervix and uterus: Secondary | ICD-10-CM

## 2024-07-01 DIAGNOSIS — Z8249 Family history of ischemic heart disease and other diseases of the circulatory system: Secondary | ICD-10-CM

## 2024-07-01 DIAGNOSIS — Z79624 Long term (current) use of inhibitors of nucleotide synthesis: Secondary | ICD-10-CM

## 2024-07-01 DIAGNOSIS — M19012 Primary osteoarthritis, left shoulder: Secondary | ICD-10-CM | POA: Diagnosis present

## 2024-07-01 DIAGNOSIS — Z8261 Family history of arthritis: Secondary | ICD-10-CM

## 2024-07-01 DIAGNOSIS — Z961 Presence of intraocular lens: Secondary | ICD-10-CM | POA: Diagnosis present

## 2024-07-01 DIAGNOSIS — Z8542 Personal history of malignant neoplasm of other parts of uterus: Secondary | ICD-10-CM

## 2024-07-01 DIAGNOSIS — Z87442 Personal history of urinary calculi: Secondary | ICD-10-CM

## 2024-07-01 DIAGNOSIS — Z981 Arthrodesis status: Secondary | ICD-10-CM

## 2024-07-01 DIAGNOSIS — Z818 Family history of other mental and behavioral disorders: Secondary | ICD-10-CM

## 2024-07-01 DIAGNOSIS — Z811 Family history of alcohol abuse and dependence: Secondary | ICD-10-CM

## 2024-07-01 DIAGNOSIS — F32A Depression, unspecified: Secondary | ICD-10-CM | POA: Diagnosis present

## 2024-07-01 DIAGNOSIS — Z803 Family history of malignant neoplasm of breast: Secondary | ICD-10-CM

## 2024-07-01 DIAGNOSIS — N390 Urinary tract infection, site not specified: Secondary | ICD-10-CM | POA: Diagnosis not present

## 2024-07-01 DIAGNOSIS — Z8616 Personal history of COVID-19: Secondary | ICD-10-CM

## 2024-07-01 DIAGNOSIS — I252 Old myocardial infarction: Secondary | ICD-10-CM

## 2024-07-01 DIAGNOSIS — Z8551 Personal history of malignant neoplasm of bladder: Secondary | ICD-10-CM

## 2024-07-01 DIAGNOSIS — Z8719 Personal history of other diseases of the digestive system: Secondary | ICD-10-CM

## 2024-07-01 DIAGNOSIS — M810 Age-related osteoporosis without current pathological fracture: Secondary | ICD-10-CM | POA: Diagnosis present

## 2024-07-01 DIAGNOSIS — Z820 Family history of epilepsy and other diseases of the nervous system: Secondary | ICD-10-CM

## 2024-07-01 DIAGNOSIS — Z8042 Family history of malignant neoplasm of prostate: Secondary | ICD-10-CM

## 2024-07-01 DIAGNOSIS — Z825 Family history of asthma and other chronic lower respiratory diseases: Secondary | ICD-10-CM

## 2024-07-01 DIAGNOSIS — B001 Herpesviral vesicular dermatitis: Secondary | ICD-10-CM | POA: Diagnosis present

## 2024-07-01 DIAGNOSIS — Z860101 Personal history of adenomatous and serrated colon polyps: Secondary | ICD-10-CM

## 2024-07-01 DIAGNOSIS — D751 Secondary polycythemia: Secondary | ICD-10-CM | POA: Diagnosis present

## 2024-07-01 LAB — CBC WITH DIFFERENTIAL/PLATELET
Abs Immature Granulocytes: 0.1 K/uL — ABNORMAL HIGH (ref 0.00–0.07)
Basophils Absolute: 0.1 K/uL (ref 0.0–0.1)
Basophils Relative: 0 %
Eosinophils Absolute: 0.1 K/uL (ref 0.0–0.5)
Eosinophils Relative: 1 %
HCT: 42.6 % (ref 36.0–46.0)
Hemoglobin: 15.3 g/dL — ABNORMAL HIGH (ref 12.0–15.0)
Immature Granulocytes: 1 %
Lymphocytes Relative: 6 %
Lymphs Abs: 0.9 K/uL (ref 0.7–4.0)
MCH: 31.2 pg (ref 26.0–34.0)
MCHC: 35.9 g/dL (ref 30.0–36.0)
MCV: 86.8 fL (ref 80.0–100.0)
Monocytes Absolute: 1.6 K/uL — ABNORMAL HIGH (ref 0.1–1.0)
Monocytes Relative: 9 %
Neutro Abs: 13.8 K/uL — ABNORMAL HIGH (ref 1.7–7.7)
Neutrophils Relative %: 83 %
Platelets: 252 K/uL (ref 150–400)
RBC: 4.91 MIL/uL (ref 3.87–5.11)
RDW: 12.2 % (ref 11.5–15.5)
WBC: 16.5 K/uL — ABNORMAL HIGH (ref 4.0–10.5)
nRBC: 0 % (ref 0.0–0.2)

## 2024-07-01 LAB — LIPASE, BLOOD: Lipase: 25 U/L (ref 11–51)

## 2024-07-01 LAB — COMPREHENSIVE METABOLIC PANEL WITH GFR
ALT: 24 U/L (ref 0–44)
AST: 21 U/L (ref 15–41)
Albumin: 4 g/dL (ref 3.5–5.0)
Alkaline Phosphatase: 67 U/L (ref 38–126)
Anion gap: 15 (ref 5–15)
BUN: 14 mg/dL (ref 8–23)
CO2: 19 mmol/L — ABNORMAL LOW (ref 22–32)
Calcium: 9 mg/dL (ref 8.9–10.3)
Chloride: 103 mmol/L (ref 98–111)
Creatinine, Ser: 0.65 mg/dL (ref 0.44–1.00)
GFR, Estimated: >60 mL/min (ref >60)
Glucose, Bld: 114 mg/dL — ABNORMAL HIGH (ref 70–99)
Potassium: 3.8 mmol/L (ref 3.5–5.1)
Sodium: 137 mmol/L (ref 135–145)
Total Bilirubin: 1.3 mg/dL — ABNORMAL HIGH (ref 0.0–1.2)
Total Protein: 7.9 g/dL (ref 6.5–8.1)

## 2024-07-01 LAB — RESP PANEL BY RT-PCR (RSV, FLU A&B, COVID)  RVPGX2
Influenza A by PCR: NEGATIVE
Influenza B by PCR: NEGATIVE
Resp Syncytial Virus by PCR: NEGATIVE
SARS Coronavirus 2 by RT PCR: NEGATIVE

## 2024-07-01 LAB — GROUP A STREP BY PCR: Group A Strep by PCR: NOT DETECTED

## 2024-07-01 MED ORDER — ACETAMINOPHEN 325 MG PO TABS
650.0000 mg | ORAL_TABLET | Freq: Once | ORAL | Status: AC | PRN
  Administered 2024-07-01: 650 mg via ORAL
  Filled 2024-07-01: qty 2

## 2024-07-01 NOTE — ED Triage Notes (Addendum)
 Pt arrives POV c/o dry throat, bloated, nauseated, fever, body aches, and generalized weakness x 2 days. Pt has flu and covid shot last week. Pt last took tylenol  this morning. Pt reports generalized abd pain w/ nausea that started today. Pt also reports slight pain w/ deep breath.  NADN.

## 2024-07-01 NOTE — ED Provider Notes (Incomplete)
 [facility] Provider Note    Event Date/Time   First MD Initiated Contact with Patient 07/01/24 2354     (approximate)   History   Generalized Body Aches, Fever, and Weakness   HPI  Danielle Knapp is a 68 y.o. female   Past medical history of ***    Independent Historian contributed to assessment above: ***  External Medical Documents Reviewed: ***      Physical Exam   Triage Vital Signs: ED Triage Vitals  Encounter Vitals Group     BP 07/01/24 2053 134/87     Girls Systolic BP Percentile --      Girls Diastolic BP Percentile --      Boys Systolic BP Percentile --      Boys Diastolic BP Percentile --      Pulse Rate 07/01/24 2053 91     Resp 07/01/24 2053 19     Temp 07/01/24 2053 (!) 100.8 F (38.2 C)     Temp Source 07/01/24 2053 Oral     SpO2 07/01/24 2053 97 %     Weight --      Height --      Head Circumference --      Peak Flow --      Pain Score 07/01/24 2059 4     Pain Loc --      Pain Education --      Exclude from Growth Chart --     Most recent vital signs: Vitals:   07/01/24 2053  BP: 134/87  Pulse: 91  Resp: 19  Temp: (!) 100.8 F (38.2 C)  SpO2: 97%    General: Awake, no distress. *** CV:  Good peripheral perfusion. *** Resp:  Normal effort. *** Abd:  No distention. *** Other:  ***   ED Results / Procedures / Treatments   Labs (all labs ordered are listed, but only abnormal results are displayed) Labs Reviewed  COMPREHENSIVE METABOLIC PANEL WITH GFR - Abnormal; Notable for the following components:      Result Value   CO2 19 (*)    Glucose, Bld 114 (*)    Total Bilirubin 1.3 (*)    All other components within normal limits  CBC WITH DIFFERENTIAL/PLATELET - Abnormal; Notable for the following components:   WBC 16.5 (*)    Hemoglobin 15.3 (*)    Neutro Abs 13.8 (*)    Monocytes Absolute 1.6 (*)    Abs Immature Granulocytes 0.10 (*)    All other components within normal limits  RESP PANEL BY  RT-PCR (RSV, FLU A&B, COVID)  RVPGX2  GROUP A STREP BY PCR  LIPASE, BLOOD  URINALYSIS, ROUTINE W REFLEX MICROSCOPIC     I ordered and reviewed the above labs they are notable for ***  EKG  ED ECG REPORT I, Ginnie Shams, the attending physician, personally viewed and interpreted this ECG.   Date: 07/01/2024  EKG Time: ***  Rate: ***  Rhythm: {ekg findings:315101}  Axis: ***  Intervals:{conduction defects:17367}  ST&T Change: ***    RADIOLOGY I independently reviewed and interpreted *** I also reviewed radiologist's formal read.   PROCEDURES:  Critical Care performed: {CriticalCareYesNo:19197::Yes, see critical care procedure note(s),No}  Procedures   MEDICATIONS ORDERED IN ED: Medications  acetaminophen  (TYLENOL ) tablet 650 mg (650 mg Oral Given 07/01/24 2059)    External physician / consultants:  I spoke with *** regarding care plan for this patient.   IMPRESSION / MDM / ASSESSMENT AND PLAN / ED  COURSE  I reviewed the triage vital signs and the nursing notes.                                Patient's presentation is most consistent with {EM COPA:27473}  Differential diagnosis includes, but is not limited to, ***   ***The patient is on the cardiac monitor to evaluate for evidence of arrhythmia and/or significant heart rate changes.  MDM:  ***  I considered hospitalization for admission or observation ***        FINAL CLINICAL IMPRESSION(S) / ED DIAGNOSES   Final diagnoses:  None     Rx / DC Orders   ED Discharge Orders     None        Note:  This document was prepared using Dragon voice recognition software and may include unintentional dictation errors.

## 2024-07-01 NOTE — ED Provider Notes (Signed)
 [facility] Provider Note    Event Date/Time   First MD Initiated Contact with Patient 07/01/24 2354     (approximate)   History   Generalized Body Aches, Fever, and Weakness   HPI  Danielle Knapp Call is a 68 y.o. female   Past medical history of recent rotator cuff surgery, history of breast and endometrial cancer only on hormonal therapy now, here with 3 days of myalgias, fatigue, dry cough, dry throat, vague intermittent generalized abdominal pains, back pains/flank pains, pleuritic chest pain and nausea.  No vomiting or diarrhea.  Intermittent dysuria.  Started with a fever at home yesterday and today.  Got COVID and flu shot but this was approximately 1 week ago.   Independent Historian contributed to assessment above: Husband corroborates information above       Physical Exam   Triage Vital Signs: ED Triage Vitals  Encounter Vitals Group     BP 07/01/24 2053 134/87     Girls Systolic BP Percentile --      Girls Diastolic BP Percentile --      Boys Systolic BP Percentile --      Boys Diastolic BP Percentile --      Pulse Rate 07/01/24 2053 91     Resp 07/01/24 2053 19     Temp 07/01/24 2053 (!) 100.8 F (38.2 C)     Temp Source 07/01/24 2053 Oral     SpO2 07/01/24 2053 97 %     Weight --      Height --      Head Circumference --      Peak Flow --      Pain Score 07/01/24 2059 4     Pain Loc --      Pain Education --      Exclude from Growth Chart --     Most recent vital signs: Vitals:   07/01/24 2053 07/02/24 0029  BP: 134/87 134/77  Pulse: 91 78  Resp: 19 19  Temp: (!) 100.8 F (38.2 C) 99.2 F (37.3 C)  SpO2: 97% 100%    General: Awake, no distress.  CV:  Good peripheral perfusion.  Resp:  Normal effort.  Abd:  No distention.  Other:  Febrile.  Normotensive no tachycardia.  Neck supple full range of motion, pleasant nontoxic appearing woman.  Soft abdomen with generalized mild tenderness.  No rigidity or guarding.   Clear lungs without focality or wheezing.  No hypoxemia on room air no respiratory distress.  ED Results / Procedures / Treatments   Labs (all labs ordered are listed, but only abnormal results are displayed) Labs Reviewed  COMPREHENSIVE METABOLIC PANEL WITH GFR - Abnormal; Notable for the following components:      Result Value   CO2 19 (*)    Glucose, Bld 114 (*)    Total Bilirubin 1.3 (*)    All other components within normal limits  CBC WITH DIFFERENTIAL/PLATELET - Abnormal; Notable for the following components:   WBC 16.5 (*)    Hemoglobin 15.3 (*)    Neutro Abs 13.8 (*)    Monocytes Absolute 1.6 (*)    Abs Immature Granulocytes 0.10 (*)    All other components within normal limits  RESP PANEL BY RT-PCR (RSV, FLU A&B, COVID)  RVPGX2  GROUP A STREP BY PCR  CULTURE, BLOOD (ROUTINE X 2)  CULTURE, BLOOD (ROUTINE X 2)  LIPASE, BLOOD  LACTIC ACID, PLASMA  URINALYSIS, ROUTINE W REFLEX MICROSCOPIC  LACTIC ACID, PLASMA  I ordered and reviewed the above labs they are notable for white blood cell count of 16.5.  EKG  ED ECG REPORT I, Ginnie Shams, the attending physician, personally viewed and interpreted this ECG.   Date: 07/01/2024  EKG Time: 2109  Rate: 83  Rhythm: sinus  Axis: nl  Intervals:nl  ST&T Change: no stemi    RADIOLOGY I independently reviewed and interpreted chest x-ray and I see no obvious focality pneumothorax I also reviewed radiologist's formal read.   PROCEDURES:  Critical Care performed: Yes, see critical care procedure note(s)  .Critical Care  Performed by: Shams Ginnie, MD Authorized by: Shams Ginnie, MD   Critical care provider statement:    Critical care time (minutes):  45   Critical care was time spent personally by me on the following activities:  Development of treatment plan with patient or surrogate, discussions with consultants, evaluation of patient's response to treatment, examination of patient, ordering and review of  laboratory studies, ordering and review of radiographic studies, ordering and performing treatments and interventions, pulse oximetry, re-evaluation of patient's condition and review of old charts    MEDICATIONS ORDERED IN ED: Medications  cefTRIAXone (ROCEPHIN) 1 g in sodium chloride  0.9 % 100 mL IVPB (has no administration in time range)  sodium chloride  0.9 % bolus 1,000 mL (has no administration in time range)  acetaminophen  (TYLENOL ) tablet 650 mg (650 mg Oral Given 07/01/24 2059)  sodium chloride  0.9 % bolus 1,000 mL (1,000 mLs Intravenous New Bag/Given 07/02/24 0026)  ketorolac  (TORADOL ) 15 MG/ML injection 15 mg (15 mg Intravenous Given 07/02/24 0025)  iohexol  (OMNIPAQUE ) 350 MG/ML injection 100 mL (100 mLs Intravenous Contrast Given 07/02/24 0040)    External physician / consultants:  I spoke with hospital medicine for admission and regarding care plan for this patient.   IMPRESSION / MDM / ASSESSMENT AND PLAN / ED COURSE  I reviewed the triage vital signs and the nursing notes.                                Patient's presentation is most consistent with acute presentation with potential threat to life or bodily function.  Differential diagnosis includes, but is not limited to, respiratory infection, intra-abdominal faction, urinary tract infection, viral URI, sepsis considered but less likely meningitis   The patient is on the cardiac monitor to evaluate for evidence of arrhythmia and/or significant heart rate changes.  MDM:    Patient with a constellation of symptoms in the setting of a fever concerning for infection either of the respiratory tract, abdomen, or urinary tract.  She is febrile and has leukocytosis concerning for sepsis.  Got blood cultures lactic and lab workup as well as CT angiogram of the chest given her pleuritic chest pain shortness of breath and abdominal pain better assessed by CT of the abdomen pelvis as well.  CT imaging shows evidence of  pyelonephritis.  Will medicate with IV Rocephin.  Admission.         FINAL CLINICAL IMPRESSION(S) / ED DIAGNOSES   Final diagnoses:  Sepsis, due to unspecified organism, unspecified whether acute organ dysfunction present [facility])  Pyelonephritis     Rx / DC Orders   ED Discharge Orders     None        Note:  This document was prepared using Dragon voice recognition software and may include unintentional dictation errors.    Shams Ginnie, MD 07/02/24 [phone]

## 2024-07-02 ENCOUNTER — Emergency Department

## 2024-07-02 DIAGNOSIS — N12 Tubulo-interstitial nephritis, not specified as acute or chronic: Secondary | ICD-10-CM | POA: Diagnosis not present

## 2024-07-02 DIAGNOSIS — Z803 Family history of malignant neoplasm of breast: Secondary | ICD-10-CM | POA: Diagnosis not present

## 2024-07-02 DIAGNOSIS — M19012 Primary osteoarthritis, left shoulder: Secondary | ICD-10-CM | POA: Diagnosis not present

## 2024-07-02 DIAGNOSIS — Z8249 Family history of ischemic heart disease and other diseases of the circulatory system: Secondary | ICD-10-CM | POA: Diagnosis not present

## 2024-07-02 DIAGNOSIS — F32A Depression, unspecified: Secondary | ICD-10-CM | POA: Diagnosis not present

## 2024-07-02 DIAGNOSIS — I10 Essential (primary) hypertension: Secondary | ICD-10-CM | POA: Diagnosis not present

## 2024-07-02 DIAGNOSIS — N10 Acute tubulo-interstitial nephritis: Secondary | ICD-10-CM

## 2024-07-02 DIAGNOSIS — A419 Sepsis, unspecified organism: Secondary | ICD-10-CM | POA: Diagnosis not present

## 2024-07-02 DIAGNOSIS — F419 Anxiety disorder, unspecified: Secondary | ICD-10-CM | POA: Diagnosis not present

## 2024-07-02 DIAGNOSIS — A4150 Gram-negative sepsis, unspecified: Secondary | ICD-10-CM | POA: Diagnosis not present

## 2024-07-02 DIAGNOSIS — N390 Urinary tract infection, site not specified: Secondary | ICD-10-CM

## 2024-07-02 DIAGNOSIS — Z961 Presence of intraocular lens: Secondary | ICD-10-CM | POA: Diagnosis not present

## 2024-07-02 DIAGNOSIS — H8110 Benign paroxysmal vertigo, unspecified ear: Secondary | ICD-10-CM | POA: Diagnosis not present

## 2024-07-02 DIAGNOSIS — K5730 Diverticulosis of large intestine without perforation or abscess without bleeding: Secondary | ICD-10-CM | POA: Diagnosis not present

## 2024-07-02 DIAGNOSIS — K8020 Calculus of gallbladder without cholecystitis without obstruction: Secondary | ICD-10-CM | POA: Diagnosis not present

## 2024-07-02 DIAGNOSIS — Z8616 Personal history of COVID-19: Secondary | ICD-10-CM | POA: Diagnosis not present

## 2024-07-02 DIAGNOSIS — D751 Secondary polycythemia: Secondary | ICD-10-CM | POA: Diagnosis not present

## 2024-07-02 DIAGNOSIS — K219 Gastro-esophageal reflux disease without esophagitis: Secondary | ICD-10-CM

## 2024-07-02 DIAGNOSIS — I252 Old myocardial infarction: Secondary | ICD-10-CM | POA: Diagnosis not present

## 2024-07-02 DIAGNOSIS — N200 Calculus of kidney: Secondary | ICD-10-CM | POA: Diagnosis not present

## 2024-07-02 DIAGNOSIS — H40002 Preglaucoma, unspecified, left eye: Secondary | ICD-10-CM | POA: Diagnosis not present

## 2024-07-02 DIAGNOSIS — R162 Hepatomegaly with splenomegaly, not elsewhere classified: Secondary | ICD-10-CM | POA: Diagnosis not present

## 2024-07-02 DIAGNOSIS — Z1152 Encounter for screening for COVID-19: Secondary | ICD-10-CM | POA: Diagnosis not present

## 2024-07-02 DIAGNOSIS — M810 Age-related osteoporosis without current pathological fracture: Secondary | ICD-10-CM | POA: Diagnosis not present

## 2024-07-02 DIAGNOSIS — Z833 Family history of diabetes mellitus: Secondary | ICD-10-CM | POA: Diagnosis not present

## 2024-07-02 DIAGNOSIS — B001 Herpesviral vesicular dermatitis: Secondary | ICD-10-CM | POA: Diagnosis not present

## 2024-07-02 DIAGNOSIS — A4151 Sepsis due to Escherichia coli [E. coli]: Secondary | ICD-10-CM | POA: Diagnosis not present

## 2024-07-02 DIAGNOSIS — G4730 Sleep apnea, unspecified: Secondary | ICD-10-CM | POA: Diagnosis not present

## 2024-07-02 DIAGNOSIS — M1712 Unilateral primary osteoarthritis, left knee: Secondary | ICD-10-CM | POA: Diagnosis not present

## 2024-07-02 DIAGNOSIS — Z8551 Personal history of malignant neoplasm of bladder: Secondary | ICD-10-CM | POA: Diagnosis not present

## 2024-07-02 DIAGNOSIS — I2510 Atherosclerotic heart disease of native coronary artery without angina pectoris: Secondary | ICD-10-CM | POA: Diagnosis not present

## 2024-07-02 LAB — BASIC METABOLIC PANEL WITH GFR
Anion gap: 11 (ref 5–15)
BUN: 13 mg/dL (ref 8–23)
CO2: 25 mmol/L (ref 22–32)
Calcium: 8.6 mg/dL — ABNORMAL LOW (ref 8.9–10.3)
Chloride: 105 mmol/L (ref 98–111)
Creatinine, Ser: 0.52 mg/dL (ref 0.44–1.00)
GFR, Estimated: >60 mL/min (ref >60)
Glucose, Bld: 104 mg/dL — ABNORMAL HIGH (ref 70–99)
Potassium: 3.7 mmol/L (ref 3.5–5.1)
Sodium: 141 mmol/L (ref 135–145)

## 2024-07-02 LAB — URINALYSIS, ROUTINE W REFLEX MICROSCOPIC
Bilirubin Urine: NEGATIVE
Glucose, UA: NEGATIVE mg/dL
Hgb urine dipstick: NEGATIVE
Ketones, ur: NEGATIVE mg/dL
Nitrite: NEGATIVE
Protein, ur: NEGATIVE mg/dL
Specific Gravity, Urine: 1.028 (ref 1.005–1.030)
pH: 6 (ref 5.0–8.0)

## 2024-07-02 LAB — CBC
HCT: 39.7 % (ref 36.0–46.0)
Hemoglobin: 14.4 g/dL (ref 12.0–15.0)
MCH: 31.7 pg (ref 26.0–34.0)
MCHC: 36.3 g/dL — ABNORMAL HIGH (ref 30.0–36.0)
MCV: 87.4 fL (ref 80.0–100.0)
Platelets: 213 K/uL (ref 150–400)
RBC: 4.54 MIL/uL (ref 3.87–5.11)
RDW: 12.4 % (ref 11.5–15.5)
WBC: 13.5 K/uL — ABNORMAL HIGH (ref 4.0–10.5)
nRBC: 0 % (ref 0.0–0.2)

## 2024-07-02 LAB — CORTISOL-AM, BLOOD: Cortisol - AM: 19.8 ug/dL (ref 6.7–22.6)

## 2024-07-02 LAB — PROTIME-INR
INR: 1.2 (ref 0.8–1.2)
Prothrombin Time: 15.4 s — ABNORMAL HIGH (ref 11.4–15.2)

## 2024-07-02 LAB — HIV ANTIBODY (ROUTINE TESTING W REFLEX): HIV Screen 4th Generation wRfx: NONREACTIVE

## 2024-07-02 LAB — LACTIC ACID, PLASMA: Lactic Acid, Venous: 0.9 mmol/L (ref 0.5–1.9)

## 2024-07-02 MED ORDER — ONDANSETRON HCL 4 MG PO TABS
4.0000 mg | ORAL_TABLET | Freq: Four times a day (QID) | ORAL | Status: DC | PRN
  Administered 2024-07-03: 4 mg via ORAL
  Filled 2024-07-02: qty 1

## 2024-07-02 MED ORDER — VITAMIN C 500 MG PO TABS
500.0000 mg | ORAL_TABLET | Freq: Every day | ORAL | Status: DC
  Administered 2024-07-02 – 2024-07-04 (×3): 500 mg via ORAL
  Filled 2024-07-02 (×3): qty 1

## 2024-07-02 MED ORDER — LACTATED RINGERS IV SOLN
150.0000 mL/h | INTRAVENOUS | Status: AC
  Administered 2024-07-02 (×2): 150 mL/h via INTRAVENOUS

## 2024-07-02 MED ORDER — BUPROPION HCL ER (XL) 150 MG PO TB24
300.0000 mg | ORAL_TABLET | Freq: Every day | ORAL | Status: DC
Start: 2024-07-02 — End: 2024-07-04
  Administered 2024-07-02 – 2024-07-04 (×3): 300 mg via ORAL
  Filled 2024-07-02 (×3): qty 2

## 2024-07-02 MED ORDER — SODIUM CHLORIDE 0.9 % IV SOLN
1.0000 g | INTRAVENOUS | Status: DC
  Administered 2024-07-02 – 2024-07-03 (×2): 1 g via INTRAVENOUS
  Filled 2024-07-02 (×2): qty 10

## 2024-07-02 MED ORDER — OXYCODONE HCL 5 MG PO TABS
5.0000 mg | ORAL_TABLET | ORAL | Status: DC | PRN
Start: 2024-07-02 — End: 2024-07-02

## 2024-07-02 MED ORDER — ONDANSETRON HCL 4 MG/2ML IJ SOLN: 4.0000 mg | Freq: Four times a day (QID) | INTRAMUSCULAR | Status: DC | PRN

## 2024-07-02 MED ORDER — PANTOPRAZOLE SODIUM 40 MG PO TBEC
40.0000 mg | DELAYED_RELEASE_TABLET | Freq: Every day | ORAL | Status: DC
  Administered 2024-07-02 – 2024-07-04 (×3): 40 mg via ORAL
  Filled 2024-07-02 (×3): qty 1

## 2024-07-02 MED ORDER — IRBESARTAN 150 MG PO TABS
300.0000 mg | ORAL_TABLET | Freq: Every day | ORAL | Status: DC
  Administered 2024-07-02 – 2024-07-04 (×3): 300 mg via ORAL
  Filled 2024-07-02 (×3): qty 2

## 2024-07-02 MED ORDER — ADULT MULTIVITAMIN W/MINERALS CH
1.0000 | ORAL_TABLET | Freq: Every evening | ORAL | Status: DC
  Administered 2024-07-02 – 2024-07-03 (×2): 1 via ORAL
  Filled 2024-07-02 (×2): qty 1

## 2024-07-02 MED ORDER — MAGNESIUM HYDROXIDE 400 MG/5ML PO SUSP: 30.0000 mL | Freq: Every day | ORAL | Status: DC | PRN

## 2024-07-02 MED ORDER — ACETAMINOPHEN 650 MG RE SUPP: 650.0000 mg | Freq: Four times a day (QID) | RECTAL | Status: DC | PRN

## 2024-07-02 MED ORDER — ENOXAPARIN SODIUM 40 MG/0.4ML IJ SOSY
40.0000 mg | PREFILLED_SYRINGE | INTRAMUSCULAR | Status: DC
  Administered 2024-07-02 – 2024-07-04 (×3): 40 mg via SUBCUTANEOUS
  Filled 2024-07-02 (×3): qty 0.4

## 2024-07-02 MED ORDER — SODIUM CHLORIDE 0.9 % IV BOLUS
1000.0000 mL | Freq: Once | INTRAVENOUS | Status: AC
  Administered 2024-07-02: 1000 mL via INTRAVENOUS

## 2024-07-02 MED ORDER — SERTRALINE HCL 50 MG PO TABS
200.0000 mg | ORAL_TABLET | Freq: Every day | ORAL | Status: DC
  Administered 2024-07-02 – 2024-07-04 (×3): 200 mg via ORAL
  Filled 2024-07-02 (×3): qty 4

## 2024-07-02 MED ORDER — KETOROLAC TROMETHAMINE 15 MG/ML IJ SOLN
15.0000 mg | Freq: Four times a day (QID) | INTRAMUSCULAR | Status: DC | PRN
  Administered 2024-07-02 – 2024-07-03 (×2): 15 mg via INTRAVENOUS
  Filled 2024-07-02 (×2): qty 1

## 2024-07-02 MED ORDER — KETOROLAC TROMETHAMINE 15 MG/ML IJ SOLN
15.0000 mg | Freq: Once | INTRAMUSCULAR | Status: AC
  Administered 2024-07-02: 15 mg via INTRAVENOUS
  Filled 2024-07-02: qty 1

## 2024-07-02 MED ORDER — IOHEXOL 350 MG/ML SOLN
100.0000 mL | Freq: Once | INTRAVENOUS | Status: AC | PRN
  Administered 2024-07-02: 100 mL via INTRAVENOUS

## 2024-07-02 MED ORDER — ANASTROZOLE 1 MG PO TABS
1.0000 mg | ORAL_TABLET | Freq: Every day | ORAL | Status: DC
  Administered 2024-07-02 – 2024-07-03 (×2): 1 mg via ORAL
  Filled 2024-07-02 (×2): qty 1

## 2024-07-02 MED ORDER — AMLODIPINE BESYLATE 5 MG PO TABS
5.0000 mg | ORAL_TABLET | Freq: Every day | ORAL | Status: DC
  Administered 2024-07-02 – 2024-07-04 (×3): 5 mg via ORAL
  Filled 2024-07-02 (×3): qty 1

## 2024-07-02 MED ORDER — TRAZODONE HCL 50 MG PO TABS: 25.0000 mg | ORAL_TABLET | Freq: Every evening | ORAL | Status: DC | PRN

## 2024-07-02 MED ORDER — OXYCODONE HCL 5 MG PO TABS
10.0000 mg | ORAL_TABLET | ORAL | Status: DC | PRN
  Administered 2024-07-02 – 2024-07-03 (×4): 10 mg via ORAL
  Filled 2024-07-02 (×5): qty 2

## 2024-07-02 MED ORDER — FLUTICASONE PROPIONATE 50 MCG/ACT NA SUSP: 2.0000 | Freq: Every day | NASAL | Status: DC | PRN

## 2024-07-02 MED ORDER — ACETAMINOPHEN 325 MG PO TABS
650.0000 mg | ORAL_TABLET | Freq: Four times a day (QID) | ORAL | Status: DC | PRN
  Administered 2024-07-02 – 2024-07-04 (×4): 650 mg via ORAL
  Filled 2024-07-02 (×4): qty 2

## 2024-07-02 MED ORDER — SODIUM CHLORIDE 0.9 % IV SOLN
1.0000 g | Freq: Once | INTRAVENOUS | Status: AC
  Administered 2024-07-02: 1 g via INTRAVENOUS
  Filled 2024-07-02: qty 10

## 2024-07-02 MED ORDER — OXYCODONE HCL 5 MG PO TABS
5.0000 mg | ORAL_TABLET | ORAL | Status: DC | PRN
  Administered 2024-07-02 – 2024-07-03 (×2): 5 mg via ORAL
  Filled 2024-07-02: qty 1

## 2024-07-02 MED ORDER — COLLAGEN-VITAMIN C-BIOTIN 500-50-0.8 MG PO CAPS: ORAL_CAPSULE | Freq: Two times a day (BID) | ORAL | Status: DC

## 2024-07-02 MED ORDER — ZOLPIDEM TARTRATE 5 MG PO TABS
5.0000 mg | ORAL_TABLET | Freq: Every evening | ORAL | Status: DC | PRN
  Administered 2024-07-04: 5 mg via ORAL
  Filled 2024-07-02: qty 1

## 2024-07-02 NOTE — Assessment & Plan Note (Signed)
-   Will continue Wellbutrin  XL and Zoloft .

## 2024-07-02 NOTE — Progress Notes (Signed)
  Progress Note   Patient: Danielle Knapp FMW:[id number] DOB: 02/19/1956 DOA: 07/01/2024     0 DOS: the patient was seen and examined on 07/02/2024   Brief hospital course: Taken from H&P.  Danielle Knapp is a 68 y.o. Caucasian female with medical history significant for anxiety, coronary artery disease, diverticulosis, GERD, and urolithiasis, who presented to the emergency room with fever that was up to 102 at home with associated urinary frequency and urgency over the last couple of days. She had no urine output from this afternoon.  The patient has an appointment on Thursday for surgical intervention on her right kidney stone with Dr. Cam with [facility].   On presentation febrile at 100.8, labs with CO2 of 19, T. bili 1.3, leukocytosis at 16.5, UA concerning for UTI. Blood and urine cultures sent. CXR with no acute abnormality. CT abdomen and pelvis with contrast shows no PE, did show mild left perinephric fat stranding concerning for left pyelonephritis.  Did show a right kidney nonobstructing 3 mm calculus and no other significant abnormality.  Patient received IV fluid and started on ceftriaxone.  10/31: Vital stable, preliminary blood cultures negative in 12 hours, urine cultures pending, improving leukocytosis.  Assessment and Plan: * Sepsis due to gram-negative UTI (HCC) Met sepsis criteria, likely secondary to UTI and left pyelonephritis.  Preliminary blood cultures negative with pending urine cultures. Continue with ceftriaxone- -continue with supportive care - follow-up cultures  Acute pyelonephritis - Management as above  Essential hypertension Blood pressure mildly elevated.   -Continue home amlodipine  and irbesartan   Depression - Will continue Wellbutrin  XL and Zoloft .  GERD without esophagitis - Will continue PPI therapy.   Subjective: Patient was seen and examined today.  Still having lower abdominal and back pain, also having intermittent  chills.  Physical Exam: Vitals:   07/02/24 0215 07/02/24 0230 07/02/24 0316 07/02/24 0722  BP:  (!) 149/85 (!) 160/75 (!) 142/73  Pulse: 77 80 84 87  Resp: (!) 22 16 19 18   Temp:   98.3 F (36.8 C) 99 F (37.2 C)  TempSrc:   Oral Oral  SpO2: 96% 100% 100% 94%   General. Well-developed lady, in no acute distress. Pulmonary.  Lungs clear bilaterally, normal respiratory effort. CV.  Regular rate and rhythm, no JVD, rub or murmur. Abdomen.  Soft, nontender, nondistended, BS positive. CNS.  Alert and oriented .  No focal neurologic deficit. Extremities.  No edema, no cyanosis, pulses intact and symmetrical. Psychiatry.  Judgment and insight appears normal.  I do not Data Reviewed: Prior data reviewed  Family Communication: Discussed with patient  Disposition: Status is: Inpatient Remains inpatient appropriate because: Severity of illness  Planned Discharge Destination: Home  DVT prophylaxis.  Lovenox  Time spent:  minutes  This record has been created using Conservation officer, historic buildings. Errors have been sought and corrected,but may not always be located. Such creation errors do not reflect on the standard of care.   Author: Amaryllis Dare, MD 07/02/2024 1:23 PM  For on call review [email or web address].

## 2024-07-02 NOTE — H&P (Signed)
 [place]   PATIENT NAME: Danielle Knapp    MR#:  [id number]  DATE OF BIRTH:  05/18/1956  DATE OF ADMISSION:  07/01/2024  PRIMARY CARE PHYSICIAN: Teresa Channel, MD   Patient is coming from: Home  REQUESTING/REFERRING PHYSICIAN: Cyrena Mylar, MD CHIEF COMPLAINT:   Chief Complaint  Patient presents with   Generalized Body Aches   Fever   Weakness    HISTORY OF PRESENT ILLNESS:  Danielle Knapp is a 68 y.o. Caucasian female with medical history significant for anxiety, coronary artery disease, diverticulosis, GERD, and urolithiasis, who presented to the emergency room with acute onset of fever that was up to 102 at home with associated urinary frequency and urgency over the last couple of days as well as body aches all over.  She admitted to mild dyspnea.  No cough or wheezing.  No chest pain or palpitations.  No nausea or vomiting or diarrhea.  She had no urine output from this afternoon.  The patient has an appointment on Thursday for surgical intervention on her right kidney stone with Dr. Cam with [facility].  ED Course: When the patient came to the ER temperature was 100.8 and heart rate 91 with otherwise no vital signs.  Labs revealed CO2 of 19 and total bili of 1.3 with otherwise unremarkable CMP.  Lactic acid was 0.9.  CBC showed leukocytosis 16.5 with neutrophilia and hemoconcentration.  PT 15.4 INR 1.2.  UA was suspicious for UTI.  Blood and urine cultures were sent. EKG as reviewed by me : EKG showed normal sinus rhythm with rate of 83. Imaging: Portable chest x-ray showed no acute cardiopulmonary disease. CT of the abdomen and pelvis with contrast revealed the following: 1. No pulmonary embolism. 2. Mild left perinephric fat stranding with ill-defined patchy hypoattenuation throughout the left kidney on delayed images; correlate clinically for left pyelonephritis. 3. Mild cardiomegaly with aortic atherosclerosis. 4. Hepatomegaly and cholelithiasis. 5. Mild  splenomegaly, unchanged. 6. Right kidney nonobstructing 3 mm calculus. 7. Diffuse colonic diverticulosis without diverticulitis.  The patient was given 1 L bolus of IV normal saline and 15 mg of IV Toradol  as well as 1 g of IV Rocephin.  She will be admitted to a medical telemetry bed for further evaluation and management. PAST MEDICAL HISTORY:   Past Medical History:  Diagnosis Date   Allergic rhinitis    Allergy    Anxiety    Bladder cancer (HCC) 10/17/2020   Borderline glaucoma, left    no eye drops   BPPV (benign paroxysmal positional vertigo)    Breast cancer (HCC) 01/23/2021   Cancer (HCC) 10/17/2020   Bladder and Endomitrial cancer in addition to breast cancer   Cataract    Chondromalacia of patella    left   Coronary artery disease    cardiac cath 11-03-2017 done for positive ETT,  showed very miminal nonobstrutive cad, ef 55-65%, non caridac chest pain   Diverticulosis of colon    Endometrial cancer (HCC)    Family history of breast cancer 12/08/2020   Family history of ovarian cancer 12/08/2020   Family history of prostate cancer 12/08/2020   Frequency of urination    GERD (gastroesophageal reflux disease)    History of chest pain 10/14/2017   ED visit w/ admission,  hypertensive urgency;  follow up with dr claudene--- had outpatient ETT 10-21-2017 positive ischemia, echo 10-16-2017 G1DD, 60-65%;    s/p cardiac cath 11-03-2017 w/ very miminal nonobstructive cad , chest pain non  cardiac   History of COVID-19 06/01/2021   History of diverticulitis    History of kidney stones    HSV-1 infection    fever blisters   Hx of adenomatous colonic polyps 02/08/2018   Hypertension    followed by pcp   MDD (major depressive disorder)    Myocardial infarction (HCC) 2019   minor MI   OA (osteoarthritis)    left knee, shoulder   Osteoporosis    Polycythemia    long hx followed by pcp and  sees hematology/ oncology--- dr christella. sherrod on as needed basis, chronic and told to  donate blood regularly   Rotator cuff tear 02/2023   right   Sleep apnea    pt states minimal sleep apnea that did not require cpap   Stenosis of cervix    Uterine mass    Wears glasses     PAST SURGICAL HISTORY:   Past Surgical History:  Procedure Laterality Date   ABDOMINAL HYSTERECTOMY     ACHILLES TENDON SURGERY Left 03/07/2023   Procedure: ACHILLES TENDON REPAIR - SECONDARY;  Surgeon: Ashley Soulier, DPM;  Location: [facility];  Service: Orthopedics/Podiatry;  Laterality: Left;   ARTHOSCOPIC ROTAOR CUFF REPAIR Right 05/18/2024   Procedure: REPAIR, ROTATOR CUFF, ARTHROSCOPIC;  Surgeon: Edie Norleen PARAS, MD;  Location: [facility];  Service: Orthopedics;  Laterality: Right;   BICEPT TENODESIS Right 05/18/2024   Procedure: TENODESIS, BICEPS;  Surgeon: Edie Norleen PARAS, MD;  Location: [facility];  Service: Orthopedics;  Laterality: Right;   BREAST LUMPECTOMY WITH RADIOACTIVE SEED LOCALIZATION Right 02/16/2021   Procedure: RIGHT BREAST LUMPECTOMY WITH RADIOACTIVE SEED LOCALIZATION;  Surgeon: Vernetta Berg, MD;  Location: [facility];  Service: General;  Laterality: Right;   CARDIAC CATHETERIZATION     CATARACT EXTRACTION W/ INTRAOCULAR LENS  IMPLANT, BILATERAL  2013   COLONOSCOPY  02-02-2018 dr avram   CYSTOSCOPY  10/17/2020   Procedure: CYSTOSCOPY;  Surgeon: Cathlyn JAYSON Nikki Bobie FORBES, MD;  Location: [facility];  Service: Gynecology;;   CYSTOSCOPY N/A 11/09/2020   Procedure: PHYLLIS;  Surgeon: Rosalind Zachary NOVAK, MD;  Location: [facility];  Service: Urology;  Laterality: N/A;  Dr. Rosalind needs to go first. [name] and rigid bx forceps.   CYSTOSCOPY/RETROGRADE/URETEROSCOPY Left 10/21/2023   Procedure: CYSTOSCOPY/RETROGRADE/URETEROSCOPY;  Surgeon: Watt Norleen, MD;  Location: [facility];  Service: Urology;  Laterality: Left;   DIAGNOSTIC LAPAROSCOPY  1996   DILATATION & CURETTAGE/HYSTEROSCOPY WITH MYOSURE N/A 10/17/2020   Procedure: DILATATION & CURETTAGE/HYSTEROSCOPY  WITH  MYOSURE RESECTION OF ENDOMETRIAL MASS,  OPENING OF CERVICAL STENOSIS, DRAINAGE OF UTERINE FLUID;  Surgeon: Cathlyn JAYSON Nikki Bobie FORBES, MD;  Location: [facility];  Service: Gynecology;  Laterality: N/A;   EYE SURGERY     FLEXOR TENOTOMY  Left 03/07/2023   Procedure: 72341 - FLEXOR TENDON REPAIR;  Surgeon: Ashley Soulier, DPM;  Location: [facility];  Service: Orthopedics/Podiatry;  Laterality: Left;   FRACTURE SURGERY     HEEL SPUR SURGERY Right 2015   Dr. Anderson   HEMORRHOID SURGERY  2004   LEFT HEART CATH AND CORONARY ANGIOGRAPHY N/A 11/03/2017   Procedure: LEFT HEART CATH AND CORONARY ANGIOGRAPHY;  Surgeon: Dann Candyce RAMAN, MD;  [facility];  Service: Cardiovascular;  Laterality: N/A;   LYMPH NODE DISSECTION N/A 11/09/2020   Procedure: BILATERAL LYMPH NODE DISSECTION;MINI LAPAROTOMY;  Surgeon: Viktoria Comer SAUNDERS, MD;  Location: [facility];  Service: Gynecology;  Laterality: N/A;   OPERATIVE ULTRASOUND N/A 10/17/2020  Procedure: OPERATIVE ULTRASOUND;  Surgeon: Cathlyn JAYSON Nikki Bobie FORBES, MD;  Location: [facility];  Service: Gynecology;  Laterality: N/A;   OSTECTOMY Left 03/07/2023   Procedure: HAGLUNDS RETROCALCANEAL OSTECTOMY;  Surgeon: Ashley Soulier, DPM;  Location: [facility];  Service: Orthopedics/Podiatry;  Laterality: Left;   POSTERIOR LUMBAR FUSION 2 WITH HARDWARE REMOVAL Right 05/18/2024   Procedure: ARTHROSCOPY, SHOULDER WITH DEBRIDEMENT;  Surgeon: Edie Norleen PARAS, MD;  Location: [facility];  Service: Orthopedics;  Laterality: Right;   ROBOTIC ASSISTED TOTAL HYSTERECTOMY WITH BILATERAL SALPINGO OOPHERECTOMY Bilateral 11/09/2020   Procedure: XI ROBOTIC ASSISTED TOTAL HYSTERECTOMY WITH BILATERAL SALPINGO OOPHORECTOMY,;  Surgeon: Viktoria Comer SAUNDERS, MD;  Location: [facility];  Service: Gynecology;  Laterality: Bilateral;   SEPTOPLASTY  x2 1970's   SHOULDER OPEN ROTATOR CUFF REPAIR Right 05/18/2024   Procedure: REPAIR, ROTATOR CUFF, OPEN;   Surgeon: Edie Norleen PARAS, MD;  Location: [facility];  Service: Orthopedics;  Laterality: Right;   SUBACROMIAL DECOMPRESSION Right 05/18/2024   Procedure: DECOMPRESSION, SUBACROMIAL SPACE;  Surgeon: Edie Norleen PARAS, MD;  Location: [facility];  Service: Orthopedics;  Laterality: Right;   TONSILLECTOMY  age 65   TRANSURETHRAL RESECTION OF BLADDER TUMOR N/A 11/09/2020   Procedure: TRANSURETHRAL RESECTION OF BLADDER TUMOR (TURBT);  Surgeon: Rosalind Zachary NOVAK, MD;  Location: [facility];  Service: Urology;  Laterality: N/A;    SOCIAL HISTORY:   Social History   Tobacco Use   Smoking status: Never   Smokeless tobacco: Never  Substance Use Topics   Alcohol use: Yes    Alcohol/week: 2.0 standard drinks of alcohol    Types: 2 Glasses of wine per week    Comment: occasional wine    FAMILY HISTORY:   Family History  Problem Relation Age of Onset   Osteoporosis Mother    Heart attack Mother    Alzheimer's disease Mother    Arthritis Mother    Depression Mother    Diabetes Father    Hypertension Father    Heart failure Father    Alcohol abuse Father    COPD Father    Ovarian cancer Sister 60   Stroke Maternal Grandmother    Prostate cancer Maternal Grandfather 74       metastatic   Cancer Maternal Grandfather    Breast cancer Other        MGM's sister; dx after 5   Vision loss Paternal Grandmother    Colon cancer Neg Hx    Colon polyps Neg Hx    Esophageal cancer Neg Hx    Rectal cancer Neg Hx    Stomach cancer Neg Hx    Endometrial cancer Neg Hx    Pancreatic cancer Neg Hx     DRUG ALLERGIES:   Allergies  Allergen Reactions   Ciprofloxacin Hcl Anaphylaxis and Rash   Erythromycin Diarrhea and Other (See Comments)   Flagyl [Metronidazole] Anaphylaxis, Rash and Other (See Comments)   Ace Inhibitors Cough   Lisinopril Cough    REVIEW OF SYSTEMS:   ROS As per history of present illness. All pertinent systems were reviewed above. Constitutional, HEENT, cardiovascular, respiratory,  GI, GU, musculoskeletal, neuro, psychiatric, endocrine, integumentary and hematologic systems were reviewed and are otherwise negative/unremarkable except for positive findings mentioned above in the HPI.   MEDICATIONS AT HOME:   Prior to Admission medications   Medication Sig Start Date End Date Taking? Authorizing Provider  amLODipine  (NORVASC ) 5 MG tablet Take 5 mg by mouth daily.    [provider]  anastrozole  (ARIMIDEX )  1 MG tablet TAKE ONE TABLET BY MOUTH ONE TIME DAILY Patient taking differently: Take 1 mg by mouth at bedtime. 05/18/24   Gudena, Vinay, MD  ascorbic acid (VITAMIN C) 500 MG tablet Take 500 mg by mouth daily.    [provider]  buPROPion  (WELLBUTRIN  XL) 300 MG 24 hr tablet Take 300 mg by mouth every morning.    [provider]  COLLAGEN PO Take 1 tablet by mouth 2 (two) times daily.    [provider]  fluticasone (FLONASE) 50 MCG/ACT nasal spray Place 2 sprays into both nostrils daily as needed for allergies. 10/13/19   [provider]  Multiple Minerals-Vitamins (CAL MAG ZINC +D3 PO) Take 1 tablet by mouth daily.    [provider]  Multiple Vitamin (MULTIVITAMIN WITH MINERALS) TABS tablet Take 1 tablet by mouth every evening.    [provider]  oxyCODONE  (ROXICODONE ) 5 MG immediate release tablet Take 1-2 tablets (5-10 mg total) by mouth every 4 (four) hours as needed for moderate pain (pain score 4-6) or severe pain (pain score 7-10). 05/18/24   Poggi, Norleen PARAS, MD  pantoprazole (PROTONIX) 40 MG tablet Take 40 mg by mouth daily.    [provider]  sertraline  (ZOLOFT ) 100 MG tablet Take 200 mg by mouth every morning. 07/22/12   [provider]  valACYclovir  (VALTREX ) 1000 MG tablet Take 2 tablets (2,000 mg total) by mouth 2 (two) times daily as needed (for fever blisters). 08/20/22   Cathlyn JAYSON Nikki Bobie FORBES, MD  valsartan (DIOVAN) 320 MG tablet Take 320 mg by mouth daily.    [provider]  zolpidem  (AMBIEN ) 5 MG tablet Take 5 mg by mouth at bedtime. 06/21/21   [provider]      VITAL SIGNS:  Blood pressure (!) 160/75, pulse 84, temperature 98.3 F (36.8 C), temperature source Oral, resp. rate 19, last menstrual period 09/03/2011, SpO2 100%.  PHYSICAL EXAMINATION:  Physical Exam  GENERAL:  68 y.o.-year-old Caucasian female patient lying in the bed with no acute distress.  EYES: Pupils equal, round, reactive to light and accommodation. No scleral icterus. Extraocular muscles intact.  HEENT: Head atraumatic, normocephalic. Oropharynx and nasopharynx clear.  NECK:  Supple, no jugular venous distention. No thyroid  enlargement, no tenderness.  LUNGS: Normal breath sounds bilaterally, no wheezing, rales,rhonchi or crepitation. No use of accessory muscles of respiration.  CARDIOVASCULAR: Regular rate and rhythm, S1, S2 normal. No murmurs, rubs, or gallops.  ABDOMEN: Soft, nondistended, nontender. Bowel sounds present. No organomegaly or mass.  Left CVA tenderness. EXTREMITIES: No pedal edema, cyanosis, or clubbing.  NEUROLOGIC: Cranial nerves II through XII are intact. Muscle strength 5/5 in all extremities. Sensation intact. Gait not checked.  PSYCHIATRIC: The patient is alert and oriented x 3.  Normal affect and good eye contact. SKIN: No obvious rash, lesion, or ulcer.   LABORATORY PANEL:   CBC Recent Labs  Lab 07/02/24 0430  WBC 13.5*  HGB 14.4  HCT 39.7  PLT 213   ------------------------------------------------------------------------------------------------------------------  Chemistries  Recent Labs  Lab 07/01/24 2102 07/02/24 0430  NA 137 141  K 3.8 3.7  CL 103 105  CO2 19* 25  GLUCOSE 114* 104*  BUN 14 13  CREATININE 0.65 0.52  CALCIUM  9.0 8.6*  AST 21  --   ALT 24  --   ALKPHOS 67  --   BILITOT 1.3*  --     ------------------------------------------------------------------------------------------------------------------  Cardiac Enzymes No results for input(s): TROPONINI in the last  168 hours. ------------------------------------------------------------------------------------------------------------------  RADIOLOGY:  CT Angio Chest PE W/Cm &/Or Wo Cm Result Date: 07/02/2024 EXAM: CTA CHEST PE WITHOUT AND WITH CONTRAST CT ABDOMEN AND PELVIS WITHOUT AND WITH CONTRAST 07/02/2024 12:50:35 AM TECHNIQUE: CTA of the chest was performed without and with the administration of 100 mL of iohexol  (OMNIPAQUE ) 350 MG/ML injection. Multiplanar reformatted images are provided for review. MIP images are provided for review. CT of the abdomen and pelvis was performed without and with the administration of 100 mL of iohexol  (OMNIPAQUE ) 350 MG/ML injection. Automated exposure control, iterative reconstruction, and/or weight based adjustment of the mA/kV was utilized to reduce the radiation dose to as low as reasonably achievable. COMPARISON: CT venous stone 09/15/1998 and 09/26/1998. CLINICAL HISTORY: Pulmonary embolism (PE) suspected, high prob. FINDINGS: CHEST: PULMONARY ARTERIES: Pulmonary arteries are adequately opacified for evaluation. No intraluminal filling defect to suggest pulmonary embolism. Main pulmonary artery is normal in caliber. MEDIASTINUM: The heart is mildly enlarged. There are atherosclerotic calcifications of the aorta. No mediastinal lymphadenopathy. The pericardium demonstrate no acute abnormality. LUNGS AND PLEURA: There is atelectasis in the left lower lobe and right middle lobe. No focal consolidation or pulmonary edema. No pleural effusion or pneumothorax. SOFT TISSUES AND BONES: There is inferior right breast skin thickening which is unchanged. No acute bone abnormality. ABDOMEN AND PELVIS: LIVER: The liver is enlarged. GALLBLADDER AND BILE DUCTS: Small gallstones are present. No biliary  ductal dilatation. SPLEEN: The spleen is mildly enlarged, unchanged. PANCREAS: Pancreas demonstrates no acute abnormality. ADRENAL GLANDS: Adrenal glands demonstrate no acute abnormality. KIDNEYS, URETERS AND BLADDER: There is a nonobstructing 3 mm calculus in the right kidney. There is mild left perinephric fat stranding. On delayed images there are ill-defined patchy areas of decreased attenuation throughout the left kidney. No stones in the ureters. No hydronephrosis. No periureteral stranding. Urinary bladder is unremarkable. GI AND BOWEL: There is diffuse colonic diverticulosis. The appendix appears normal. There is a small hiatal hernia. Stomach and duodenal sweep demonstrate no acute abnormality. There is no bowel obstruction. No abnormal bowel wall thickening or distension. REPRODUCTIVE: The uterus is surgically absent. PERITONEUM AND RETROPERITONEUM: There is a small fat-containing umbilical hernia. No ascites or free air. LYMPH NODES: No lymphadenopathy. VASCULATURE: There are atherosclerotic calcifications of the aorta. BONES AND SOFT TISSUES: No acute abnormality of the visualized bones. No focal soft tissue abnormality. IMPRESSION: 1. No pulmonary embolism. 2. Mild left perinephric fat stranding with ill-defined patchy hypoattenuation throughout the left kidney on delayed images; correlate clinically for left pyelonephritis. 3. Mild cardiomegaly with aortic atherosclerosis. 4. Hepatomegaly and cholelithiasis. 5. Mild splenomegaly, unchanged. 6. Right kidney nonobstructing 3 mm calculus. 7. Diffuse colonic diverticulosis without diverticulitis. Electronically signed by: Greig Pique MD 07/02/2024 01:16 AM EDT RP Workstation: [id number]   CT ABDOMEN PELVIS W CONTRAST Result Date: 07/02/2024 EXAM: CTA CHEST PE WITHOUT AND WITH CONTRAST CT ABDOMEN AND PELVIS WITHOUT AND WITH CONTRAST 07/02/2024 12:50:35 AM TECHNIQUE: CTA of the chest was performed without and with the administration of 100 mL of iohexol   (OMNIPAQUE ) 350 MG/ML injection. Multiplanar reformatted images are provided for review. MIP images are provided for review. CT of the abdomen and pelvis was performed without and with the administration of 100 mL of iohexol  (OMNIPAQUE ) 350 MG/ML injection. Automated exposure control, iterative reconstruction, and/or weight based adjustment of the mA/kV was utilized to reduce the radiation dose to as low as reasonably achievable. COMPARISON: CT venous stone 09/15/1998 and 09/26/1998. CLINICAL HISTORY: Pulmonary embolism (PE) suspected, high prob. FINDINGS: CHEST: PULMONARY  ARTERIES: Pulmonary arteries are adequately opacified for evaluation. No intraluminal filling defect to suggest pulmonary embolism. Main pulmonary artery is normal in caliber. MEDIASTINUM: The heart is mildly enlarged. There are atherosclerotic calcifications of the aorta. No mediastinal lymphadenopathy. The pericardium demonstrate no acute abnormality. LUNGS AND PLEURA: There is atelectasis in the left lower lobe and right middle lobe. No focal consolidation or pulmonary edema. No pleural effusion or pneumothorax. SOFT TISSUES AND BONES: There is inferior right breast skin thickening which is unchanged. No acute bone abnormality. ABDOMEN AND PELVIS: LIVER: The liver is enlarged. GALLBLADDER AND BILE DUCTS: Small gallstones are present. No biliary ductal dilatation. SPLEEN: The spleen is mildly enlarged, unchanged. PANCREAS: Pancreas demonstrates no acute abnormality. ADRENAL GLANDS: Adrenal glands demonstrate no acute abnormality. KIDNEYS, URETERS AND BLADDER: There is a nonobstructing 3 mm calculus in the right kidney. There is mild left perinephric fat stranding. On delayed images there are ill-defined patchy areas of decreased attenuation throughout the left kidney. No stones in the ureters. No hydronephrosis. No periureteral stranding. Urinary bladder is unremarkable. GI AND BOWEL: There is diffuse colonic diverticulosis. The appendix appears  normal. There is a small hiatal hernia. Stomach and duodenal sweep demonstrate no acute abnormality. There is no bowel obstruction. No abnormal bowel wall thickening or distension. REPRODUCTIVE: The uterus is surgically absent. PERITONEUM AND RETROPERITONEUM: There is a small fat-containing umbilical hernia. No ascites or free air. LYMPH NODES: No lymphadenopathy. VASCULATURE: There are atherosclerotic calcifications of the aorta. BONES AND SOFT TISSUES: No acute abnormality of the visualized bones. No focal soft tissue abnormality. IMPRESSION: 1. No pulmonary embolism. 2. Mild left perinephric fat stranding with ill-defined patchy hypoattenuation throughout the left kidney on delayed images; correlate clinically for left pyelonephritis. 3. Mild cardiomegaly with aortic atherosclerosis. 4. Hepatomegaly and cholelithiasis. 5. Mild splenomegaly, unchanged. 6. Right kidney nonobstructing 3 mm calculus. 7. Diffuse colonic diverticulosis without diverticulitis. Electronically signed by: Greig Pique MD 07/02/2024 01:16 AM EDT RP Workstation: [id number]   DG Chest 1 View Result Date: 07/01/2024 EXAM: 1 VIEW(S) XRAY OF THE CHEST 07/01/2024 09:21:00 PM COMPARISON: Comparison study 11/14/2021. CLINICAL HISTORY: Shortness of breath. FINDINGS: LUNGS AND PLEURA: No focal pulmonary opacity. No pulmonary edema. No pleural effusion. No pneumothorax. HEART AND MEDIASTINUM: Atherosclerotic calcifications of aortic arch. No acute abnormality of the cardiac and mediastinal silhouettes. BONES AND SOFT TISSUES: No acute osseous abnormality. IMPRESSION: 1. No acute cardiopulmonary process. Electronically signed by: Maude Stammer MD 07/01/2024 09:28 PM EDT RP Workstation: [id number]      IMPRESSION AND PLAN:  Assessment and Plan: * Sepsis due to gram-negative UTI [facility]) - The patient was admitted to a medical telemetry bed - Will continue antibiotic therapy with IV Rocephin. - The patient will be hydrated with IV lactated  ringer. - Will follow blood and urine cultures. - Sepsis manifested by fever and tachycardia with leukocytosis.  Acute pyelonephritis - Management as above  GERD without esophagitis - Will continue PPI therapy.  Essential hypertension - Will continue antihypertensive therapy.  Depression - Will continue Wellbutrin  XL and Zoloft .   DVT prophylaxis: Lovenox .  Advanced Care Planning:  Code Status: full code.  Family Communication:  The plan of care was discussed in details with the patient (and family). I answered all questions. The patient agreed to proceed with the above mentioned plan. Further management will depend upon hospital course. Disposition Plan: Back to previous home environment Consults called: none.  All the records are reviewed and case discussed with ED provider.  Status is: Inpatient  At the time of the admission, it appears that the appropriate admission status for this patient is inpatient.  This is judged to be reasonable and necessary in order to provide the required intensity of service to ensure the patient's safety given the presenting symptoms, physical exam findings and initial radiographic and laboratory data in the context of comorbid conditions.  The patient requires inpatient status due to high intensity of service, high risk of further deterioration and high frequency of surveillance required.  I certify that at the time of admission, it is my clinical judgment that the patient will require inpatient hospital care extending more than 2 midnights.                            Dispo: The patient is from: Home              Anticipated d/c is to: Home              Patient currently is not medically stable to d/c.              Difficult to place patient: No  Madison DELENA Peaches M.D on 07/02/2024 at 6:25 AM  [facility]   From 7 PM-7 AM, contact night-coverage [email or web address]  CC: Primary care physician; Teresa Channel, MD

## 2024-07-02 NOTE — Hospital Course (Addendum)
 Taken from H&P.  Danielle Knapp is a 68 y.o. Caucasian female with medical history significant for anxiety, coronary artery disease, diverticulosis, GERD, and urolithiasis, who presented to the emergency room with fever that was up to 102 at home with associated urinary frequency and urgency over the last couple of days. She had no urine output from this afternoon.  The patient has an appointment on Thursday for surgical intervention on her right kidney stone with Dr. Cam with [facility].   On presentation febrile at 100.8, labs with CO2 of 19, T. bili 1.3, leukocytosis at 16.5, UA concerning for UTI. Blood and urine cultures sent. CXR with no acute abnormality. CT abdomen and pelvis with contrast shows no PE, did show mild left perinephric fat stranding concerning for left pyelonephritis.  Did show a right kidney nonobstructing 3 mm calculus and no other significant abnormality.  Patient received IV fluid and started on ceftriaxone.  10/31: Vital stable, preliminary blood cultures negative in 12 hours, urine cultures pending, improving leukocytosis.  11/1: Hemodynamically stable, urine cultures growing E. coli and blood cultures remain negative.  11/2: Remained hemodynamically stable, urine cultures with pansensitive E. coli and blood cultures remain negative.  Patient received ceftriaxone while in the hospital and is being discharged on Keflex for a 7-day course.  She had an upcoming appointment with urology on Thursday to take care of her nephrolithiasis.  She will continue on current medications and need to have a close follow-up with her providers for further assistance

## 2024-07-02 NOTE — Assessment & Plan Note (Signed)
-   Will continue PPI therapy.

## 2024-07-02 NOTE — Assessment & Plan Note (Addendum)
 Met sepsis criteria, likely secondary to UTI and left pyelonephritis.  Preliminary blood cultures negative with pending urine cultures. Continue with ceftriaxone- -continue with supportive care - follow-up cultures

## 2024-07-02 NOTE — Assessment & Plan Note (Signed)
 Management as above

## 2024-07-02 NOTE — Assessment & Plan Note (Addendum)
 Blood pressure mildly elevated.   -Continue home amlodipine  and irbesartan

## 2024-07-02 NOTE — Plan of Care (Signed)
  Problem: Clinical Measurements: Goal: Ability to maintain clinical measurements within normal limits will improve Outcome: Progressing   Problem: Clinical Measurements: Goal: Diagnostic test results will improve Outcome: Progressing   Problem: Pain Managment: Goal: General experience of comfort will improve and/or be controlled Outcome: Progressing

## 2024-07-03 DIAGNOSIS — N12 Tubulo-interstitial nephritis, not specified as acute or chronic: Secondary | ICD-10-CM | POA: Diagnosis not present

## 2024-07-03 DIAGNOSIS — I10 Essential (primary) hypertension: Secondary | ICD-10-CM | POA: Diagnosis not present

## 2024-07-03 DIAGNOSIS — A4150 Gram-negative sepsis, unspecified: Secondary | ICD-10-CM | POA: Diagnosis not present

## 2024-07-03 DIAGNOSIS — F32A Depression, unspecified: Secondary | ICD-10-CM | POA: Diagnosis not present

## 2024-07-03 NOTE — Plan of Care (Signed)
  Problem: Clinical Measurements: Goal: Ability to maintain clinical measurements within normal limits will improve Outcome: Progressing   Problem: Clinical Measurements: Goal: Will remain free from infection Outcome: Progressing   Problem: Pain Managment: Goal: General experience of comfort will improve and/or be controlled Outcome: Progressing   Problem: Safety: Goal: Ability to remain free from injury will improve Outcome: Progressing

## 2024-07-03 NOTE — Assessment & Plan Note (Signed)
 Met sepsis criteria, likely secondary to UTI and left pyelonephritis.  Preliminary blood cultures negative, urine cultures growing E. coli-pending susceptibility Continue with ceftriaxone- -continue with supportive care - follow-up cultures

## 2024-07-03 NOTE — Plan of Care (Signed)

## 2024-07-03 NOTE — Progress Notes (Signed)
  Progress Note   Patient: Danielle Knapp FMW:[id number] DOB: Apr 27, 1956 DOA: 07/01/2024     1 DOS: the patient was seen and examined on 07/03/2024   Brief hospital course: Taken from H&P.  Danielle Knapp is a 68 y.o. Caucasian female with medical history significant for anxiety, coronary artery disease, diverticulosis, GERD, and urolithiasis, who presented to the emergency room with fever that was up to 102 at home with associated urinary frequency and urgency over the last couple of days. She had no urine output from this afternoon.  The patient has an appointment on Thursday for surgical intervention on her right kidney stone with Dr. Cam with [facility].   On presentation febrile at 100.8, labs with CO2 of 19, T. bili 1.3, leukocytosis at 16.5, UA concerning for UTI. Blood and urine cultures sent. CXR with no acute abnormality. CT abdomen and pelvis with contrast shows no PE, did show mild left perinephric fat stranding concerning for left pyelonephritis.  Did show a right kidney nonobstructing 3 mm calculus and no other significant abnormality.  Patient received IV fluid and started on ceftriaxone.  10/31: Vital stable, preliminary blood cultures negative in 12 hours, urine cultures pending, improving leukocytosis.  11/1: Hemodynamically stable, urine cultures growing E. coli and blood cultures remain negative.  Assessment and Plan: * Sepsis due to gram-negative UTI (HCC) Met sepsis criteria, likely secondary to UTI and left pyelonephritis.  Preliminary blood cultures negative, urine cultures growing E. coli-pending susceptibility Continue with ceftriaxone- -continue with supportive care - follow-up cultures  Pyelonephritis - Management as above  Essential hypertension Blood pressure mildly elevated.   -Continue home amlodipine  and irbesartan   Depression - Will continue Wellbutrin  XL and Zoloft .  GERD without esophagitis - Will continue PPI  therapy.  Subjective: Patient was still not feeling to self, having intermittent chills.  Physical Exam: Vitals:   07/02/24 1515 07/02/24 1955 07/03/24 0520 07/03/24 0807  BP: 132/74 (!) 160/77 139/79 (!) 142/81  Pulse: 90 91 85 75  Resp: 18 16 16 14   Temp: 98.9 F (37.2 C) 99.3 F (37.4 C) 98.3 F (36.8 C) 98 F (36.7 C)  TempSrc: Oral Oral    SpO2: 96% 94% 95% 96%   General.  Well-developed lady, in no acute distress. Pulmonary.  Lungs clear bilaterally, normal respiratory effort. CV.  Regular rate and rhythm, no JVD, rub or murmur. Abdomen.  Soft, nontender, nondistended, BS positive. CNS.  Alert and oriented .  No focal neurologic deficit. Extremities.  No edema, no cyanosis, pulses intact and symmetrical. Psychiatry.  Judgment and insight appears normal.   Data Reviewed: Prior data reviewed  Family Communication: Discussed with patient  Disposition: Status is: Inpatient Remains inpatient appropriate because: Severity of illness  Planned Discharge Destination: Home  DVT prophylaxis.  Lovenox  Time spent: 50 minutes  This record has been created using Conservation officer, historic buildings. Errors have been sought and corrected,but may not always be located. Such creation errors do not reflect on the standard of care.   Author: Amaryllis Dare, MD 07/03/2024 2:00 PM  For on call review [email or web address].

## 2024-07-03 NOTE — Assessment & Plan Note (Signed)
 Blood pressure mildly elevated.   -Continue home amlodipine  and irbesartan

## 2024-07-04 DIAGNOSIS — F32A Depression, unspecified: Secondary | ICD-10-CM | POA: Diagnosis not present

## 2024-07-04 DIAGNOSIS — N12 Tubulo-interstitial nephritis, not specified as acute or chronic: Secondary | ICD-10-CM | POA: Diagnosis not present

## 2024-07-04 DIAGNOSIS — I10 Essential (primary) hypertension: Secondary | ICD-10-CM | POA: Diagnosis not present

## 2024-07-04 DIAGNOSIS — A4150 Gram-negative sepsis, unspecified: Secondary | ICD-10-CM | POA: Diagnosis not present

## 2024-07-04 LAB — BASIC METABOLIC PANEL WITH GFR
Anion gap: 15 (ref 5–15)
BUN: 16 mg/dL (ref 8–23)
CO2: 24 mmol/L (ref 22–32)
Calcium: 8.9 mg/dL (ref 8.9–10.3)
Chloride: 104 mmol/L (ref 98–111)
Creatinine, Ser: 0.72 mg/dL (ref 0.44–1.00)
GFR, Estimated: >60 mL/min (ref >60)
Glucose, Bld: 126 mg/dL — ABNORMAL HIGH (ref 70–99)
Potassium: 3.5 mmol/L (ref 3.5–5.1)
Sodium: 143 mmol/L (ref 135–145)

## 2024-07-04 LAB — URINE CULTURE: Culture: >=100000 — AB

## 2024-07-04 LAB — CBC
HCT: 36.2 % (ref 36.0–46.0)
Hemoglobin: 13.2 g/dL (ref 12.0–15.0)
MCH: 31.4 pg (ref 26.0–34.0)
MCHC: 36.5 g/dL — ABNORMAL HIGH (ref 30.0–36.0)
MCV: 86 fL (ref 80.0–100.0)
Platelets: 247 K/uL (ref 150–400)
RBC: 4.21 MIL/uL (ref 3.87–5.11)
RDW: 12.4 % (ref 11.5–15.5)
WBC: 7.5 K/uL (ref 4.0–10.5)
nRBC: 0 % (ref 0.0–0.2)

## 2024-07-04 MED ORDER — CEPHALEXIN 500 MG PO CAPS: 500.0000 mg | ORAL_CAPSULE | Freq: Three times a day (TID) | ORAL | 0 refills | Status: AC

## 2024-07-04 MED ORDER — POLYETHYLENE GLYCOL 3350 17 G PO PACK
17.0000 g | PACK | Freq: Every day | ORAL | Status: DC
  Administered 2024-07-04: 17 g via ORAL
  Filled 2024-07-04: qty 1

## 2024-07-04 MED ORDER — DOCUSATE SODIUM 100 MG PO CAPS
100.0000 mg | ORAL_CAPSULE | Freq: Two times a day (BID) | ORAL | Status: DC
  Administered 2024-07-04: 100 mg via ORAL
  Filled 2024-07-04: qty 1

## 2024-07-04 NOTE — Plan of Care (Signed)
  Problem: Education: Goal: Knowledge of General Education information will improve Description: Including pain rating scale, medication(s)/side effects and non-pharmacologic comfort measures 07/04/2024 1102 by Lawanna Sprawls, RN Outcome: Progressing 07/04/2024 1005 by Lawanna Sprawls, RN Outcome: Progressing   Problem: Health Behavior/Discharge Planning: Goal: Ability to manage health-related needs will improve 07/04/2024 1102 by Lawanna Sprawls, RN Outcome: Progressing 07/04/2024 1005 by Lawanna Sprawls, RN Outcome: Progressing   Problem: Clinical Measurements: Goal: Ability to maintain clinical measurements within normal limits will improve 07/04/2024 1102 by Lawanna Sprawls, RN Outcome: Progressing 07/04/2024 1005 by Lawanna Sprawls, RN Outcome: Progressing Goal: Will remain free from infection 07/04/2024 1102 by Lawanna Sprawls, RN Outcome: Progressing 07/04/2024 1005 by Lawanna Sprawls, RN Outcome: Progressing Goal: Diagnostic test results will improve 07/04/2024 1102 by Lawanna Sprawls, RN Outcome: Progressing 07/04/2024 1005 by Lawanna Sprawls, RN Outcome: Progressing Goal: Respiratory complications will improve 07/04/2024 1102 by Lawanna Sprawls, RN Outcome: Progressing 07/04/2024 1005 by Lawanna Sprawls, RN Outcome: Progressing Goal: Cardiovascular complication will be avoided 07/04/2024 1102 by Lawanna Sprawls, RN Outcome: Progressing 07/04/2024 1005 by Lawanna Sprawls, RN Outcome: Progressing   Problem: Activity: Goal: Risk for activity intolerance will decrease 07/04/2024 1102 by Lawanna Sprawls, RN Outcome: Progressing 07/04/2024 1005 by Lawanna Sprawls, RN Outcome: Progressing

## 2024-07-04 NOTE — Discharge Planning (Signed)
 Dc instruction given to patient  IV removed All questions answered No other concerns at this time

## 2024-07-04 NOTE — Plan of Care (Signed)

## 2024-07-04 NOTE — Discharge Summary (Signed)
 Physician Discharge Summary   Patient: Danielle Knapp MRN: [id number] DOB: [birth year]  Admit date:     07/01/2024  Discharge date: 07/04/24  Discharge Physician: Amaryllis Dare   PCP: Teresa Channel, MD   Recommendations at discharge:  Please obtain CBC and BMP and follow-up Follow-up with primary care provider  Discharge Diagnoses: Principal Problem:   Sepsis due to gram-negative UTI [facility]) Active Problems:   Pyelonephritis   Essential hypertension   Depression   GERD without esophagitis   Sepsis [facility])   Hospital Course:  Danielle Knapp is a 68 y.o. Caucasian female with medical history significant for anxiety, coronary artery disease, diverticulosis, GERD, and urolithiasis, who presented to the emergency room with fever that was up to 102 at home with associated urinary frequency and urgency over the last couple of days. She had no urine output from this afternoon.  The patient has an appointment on Thursday for surgical intervention on her right kidney stone with Dr. Cam with [facility].   On presentation febrile at 100.8, labs with CO2 of 19, T. bili 1.3, leukocytosis at 16.5, UA concerning for UTI. Blood and urine cultures sent. CXR with no acute abnormality. CT abdomen and pelvis with contrast shows no PE, did show mild left perinephric fat stranding concerning for left pyelonephritis.  Did show a right kidney nonobstructing 3 mm calculus and no other significant abnormality.  Patient received IV fluid and started on ceftriaxone.  10/31: Vital stable, preliminary blood cultures negative in 12 hours, urine cultures pending, improving leukocytosis.  11/1: Hemodynamically stable, urine cultures growing E. coli and blood cultures remain negative.  11/2: Remained hemodynamically stable, urine cultures with pansensitive E. coli and blood cultures remain negative.  Patient received ceftriaxone while in the hospital and is being discharged on Keflex for a 7-day course.   She had an upcoming appointment with urology on Thursday to take care of her nephrolithiasis.  She will continue on current medications and need to have a close follow-up with her providers for further assistance  Assessment and Plan: * Sepsis due to gram-negative UTI (HCC) Met sepsis criteria, likely secondary to UTI and left pyelonephritis.  Preliminary blood cultures negative, urine cultures with pansensitive E. coli Received ceftriaxone while in the hospital and is being discharged on Keflex to complete a 7-day course  Pyelonephritis - Management as above  Essential hypertension Blood pressure mildly elevated.   -Continue home amlodipine  and irbesartan  Depression - Will continue Wellbutrin  XL and Zoloft .  GERD without esophagitis - Will continue PPI therapy.  Consultants: None Procedures performed: None Disposition: Home Diet recommendation:  Discharge Diet Orders (From admission, onward)     Start     Ordered   07/04/24 0000  Diet - low sodium heart healthy        07/04/24 1022           Regular diet DISCHARGE MEDICATION: Allergies as of 07/04/2024       Reactions   Ciprofloxacin Hcl Anaphylaxis, Rash   Erythromycin Diarrhea, Other (See Comments)   Flagyl [metronidazole] Anaphylaxis, Rash, Other (See Comments)   Ace Inhibitors Cough   Lisinopril Cough        Medication List     TAKE these medications    amLODipine  5 MG tablet Commonly known as: NORVASC  Take 5 mg by mouth daily.   anastrozole  1 MG tablet Commonly known as: ARIMIDEX  TAKE ONE TABLET BY MOUTH ONE TIME DAILY   ascorbic acid 500 MG tablet Commonly known as:  VITAMIN C Take 500 mg by mouth daily.   buPROPion  300 MG 24 hr tablet Commonly known as: WELLBUTRIN  XL Take 300 mg by mouth every morning.   CAL MAG ZINC +D3 PO Take 1 tablet by mouth daily.   cephALEXin 500 MG capsule Commonly known as: KEFLEX Take 1 capsule (500 mg total) by mouth 3 (three) times daily for 5 days.    COLLAGEN PO Take 1 tablet by mouth 2 (two) times daily as needed.   fluticasone 50 MCG/ACT nasal spray Commonly known as: FLONASE Place 2 sprays into both nostrils daily as needed for allergies.   multivitamin with minerals Tabs tablet Take 1 tablet by mouth every evening.   oxyCODONE  5 MG immediate release tablet Commonly known as: Roxicodone  Take 1-2 tablets (5-10 mg total) by mouth every 4 (four) hours as needed for moderate pain (pain score 4-6) or severe pain (pain score 7-10).   pantoprazole 40 MG tablet Commonly known as: PROTONIX Take 40 mg by mouth daily.   sertraline  100 MG tablet Commonly known as: ZOLOFT  Take 200 mg by mouth every morning.   valACYclovir  1000 MG tablet Commonly known as: VALTREX  Take 2 tablets (2,000 mg total) by mouth 2 (two) times daily as needed (for fever blisters).   valsartan 320 MG tablet Commonly known as: DIOVAN Take 320 mg by mouth daily.   zolpidem  5 MG tablet Commonly known as: AMBIEN  Take 5 mg by mouth at bedtime.        Follow-up Information     Teresa Channel, MD. Schedule an appointment as soon as possible for a visit in 1 week(s).   Specialty: Family Medicine Contact information: [phone] [address] [place] [address] [phone]                Discharge Exam: There were no vitals filed for this visit. General.  Well-developed lady, in no acute distress. Pulmonary.  Lungs clear bilaterally, normal respiratory effort. CV.  Regular rate and rhythm, no JVD, rub or murmur. Abdomen.  Soft, nontender, nondistended, BS positive. CNS.  Alert and oriented .  No focal neurologic deficit. Extremities.  No edema, no cyanosis, pulses intact and symmetrical. Psychiatry.  Judgment and insight appears normal.   Condition at discharge: stable  The results of significant diagnostics from this hospitalization (including imaging, microbiology, ancillary and laboratory) are listed below for reference.    Imaging Studies: CT Angio Chest PE W/Cm &/Or Wo Cm Result Date: 07/02/2024 EXAM: CTA CHEST PE WITHOUT AND WITH CONTRAST CT ABDOMEN AND PELVIS WITHOUT AND WITH CONTRAST 07/02/2024 12:50:35 AM TECHNIQUE: CTA of the chest was performed without and with the administration of 100 mL of iohexol  (OMNIPAQUE ) 350 MG/ML injection. Multiplanar reformatted images are provided for review. MIP images are provided for review. CT of the abdomen and pelvis was performed without and with the administration of 100 mL of iohexol  (OMNIPAQUE ) 350 MG/ML injection. Automated exposure control, iterative reconstruction, and/or weight based adjustment of the mA/kV was utilized to reduce the radiation dose to as low as reasonably achievable. COMPARISON: CT venous stone 09/15/1998 and 09/26/1998. CLINICAL HISTORY: Pulmonary embolism (PE) suspected, high prob. FINDINGS: CHEST: PULMONARY ARTERIES: Pulmonary arteries are adequately opacified for evaluation. No intraluminal filling defect to suggest pulmonary embolism. Main pulmonary artery is normal in caliber. MEDIASTINUM: The heart is mildly enlarged. There are atherosclerotic calcifications of the aorta. No mediastinal lymphadenopathy. The pericardium demonstrate no acute abnormality. LUNGS AND PLEURA: There is atelectasis in the left lower lobe and right  middle lobe. No focal consolidation or pulmonary edema. No pleural effusion or pneumothorax. SOFT TISSUES AND BONES: There is inferior right breast skin thickening which is unchanged. No acute bone abnormality. ABDOMEN AND PELVIS: LIVER: The liver is enlarged. GALLBLADDER AND BILE DUCTS: Small gallstones are present. No biliary ductal dilatation. SPLEEN: The spleen is mildly enlarged, unchanged. PANCREAS: Pancreas demonstrates no acute abnormality. ADRENAL GLANDS: Adrenal glands demonstrate no acute abnormality. KIDNEYS, URETERS AND BLADDER: There is a nonobstructing 3 mm calculus in the right kidney. There is mild left perinephric fat  stranding. On delayed images there are ill-defined patchy areas of decreased attenuation throughout the left kidney. No stones in the ureters. No hydronephrosis. No periureteral stranding. Urinary bladder is unremarkable. GI AND BOWEL: There is diffuse colonic diverticulosis. The appendix appears normal. There is a small hiatal hernia. Stomach and duodenal sweep demonstrate no acute abnormality. There is no bowel obstruction. No abnormal bowel wall thickening or distension. REPRODUCTIVE: The uterus is surgically absent. PERITONEUM AND RETROPERITONEUM: There is a small fat-containing umbilical hernia. No ascites or free air. LYMPH NODES: No lymphadenopathy. VASCULATURE: There are atherosclerotic calcifications of the aorta. BONES AND SOFT TISSUES: No acute abnormality of the visualized bones. No focal soft tissue abnormality. IMPRESSION: 1. No pulmonary embolism. 2. Mild left perinephric fat stranding with ill-defined patchy hypoattenuation throughout the left kidney on delayed images; correlate clinically for left pyelonephritis. 3. Mild cardiomegaly with aortic atherosclerosis. 4. Hepatomegaly and cholelithiasis. 5. Mild splenomegaly, unchanged. 6. Right kidney nonobstructing 3 mm calculus. 7. Diffuse colonic diverticulosis without diverticulitis. Electronically signed by: Greig Pique MD 07/02/2024 01:16 AM EDT RP Workstation: [id number]   CT ABDOMEN PELVIS W CONTRAST Result Date: 07/02/2024 EXAM: CTA CHEST PE WITHOUT AND WITH CONTRAST CT ABDOMEN AND PELVIS WITHOUT AND WITH CONTRAST 07/02/2024 12:50:35 AM TECHNIQUE: CTA of the chest was performed without and with the administration of 100 mL of iohexol  (OMNIPAQUE ) 350 MG/ML injection. Multiplanar reformatted images are provided for review. MIP images are provided for review. CT of the abdomen and pelvis was performed without and with the administration of 100 mL of iohexol  (OMNIPAQUE ) 350 MG/ML injection. Automated exposure control, iterative reconstruction,  and/or weight based adjustment of the mA/kV was utilized to reduce the radiation dose to as low as reasonably achievable. COMPARISON: CT venous stone 09/15/1998 and 09/26/1998. CLINICAL HISTORY: Pulmonary embolism (PE) suspected, high prob. FINDINGS: CHEST: PULMONARY ARTERIES: Pulmonary arteries are adequately opacified for evaluation. No intraluminal filling defect to suggest pulmonary embolism. Main pulmonary artery is normal in caliber. MEDIASTINUM: The heart is mildly enlarged. There are atherosclerotic calcifications of the aorta. No mediastinal lymphadenopathy. The pericardium demonstrate no acute abnormality. LUNGS AND PLEURA: There is atelectasis in the left lower lobe and right middle lobe. No focal consolidation or pulmonary edema. No pleural effusion or pneumothorax. SOFT TISSUES AND BONES: There is inferior right breast skin thickening which is unchanged. No acute bone abnormality. ABDOMEN AND PELVIS: LIVER: The liver is enlarged. GALLBLADDER AND BILE DUCTS: Small gallstones are present. No biliary ductal dilatation. SPLEEN: The spleen is mildly enlarged, unchanged. PANCREAS: Pancreas demonstrates no acute abnormality. ADRENAL GLANDS: Adrenal glands demonstrate no acute abnormality. KIDNEYS, URETERS AND BLADDER: There is a nonobstructing 3 mm calculus in the right kidney. There is mild left perinephric fat stranding. On delayed images there are ill-defined patchy areas of decreased attenuation throughout the left kidney. No stones in the ureters. No hydronephrosis. No periureteral stranding. Urinary bladder is unremarkable. GI AND BOWEL: There is diffuse colonic diverticulosis. The appendix appears normal.  There is a small hiatal hernia. Stomach and duodenal sweep demonstrate no acute abnormality. There is no bowel obstruction. No abnormal bowel wall thickening or distension. REPRODUCTIVE: The uterus is surgically absent. PERITONEUM AND RETROPERITONEUM: There is a small fat-containing umbilical hernia.  No ascites or free air. LYMPH NODES: No lymphadenopathy. VASCULATURE: There are atherosclerotic calcifications of the aorta. BONES AND SOFT TISSUES: No acute abnormality of the visualized bones. No focal soft tissue abnormality. IMPRESSION: 1. No pulmonary embolism. 2. Mild left perinephric fat stranding with ill-defined patchy hypoattenuation throughout the left kidney on delayed images; correlate clinically for left pyelonephritis. 3. Mild cardiomegaly with aortic atherosclerosis. 4. Hepatomegaly and cholelithiasis. 5. Mild splenomegaly, unchanged. 6. Right kidney nonobstructing 3 mm calculus. 7. Diffuse colonic diverticulosis without diverticulitis. Electronically signed by: Greig Pique MD 07/02/2024 01:16 AM EDT RP Workstation: [id number]   DG Chest 1 View Result Date: 07/01/2024 EXAM: 1 VIEW(S) XRAY OF THE CHEST 07/01/2024 09:21:00 PM COMPARISON: Comparison study 11/14/2021. CLINICAL HISTORY: Shortness of breath. FINDINGS: LUNGS AND PLEURA: No focal pulmonary opacity. No pulmonary edema. No pleural effusion. No pneumothorax. HEART AND MEDIASTINUM: Atherosclerotic calcifications of aortic arch. No acute abnormality of the cardiac and mediastinal silhouettes. BONES AND SOFT TISSUES: No acute osseous abnormality. IMPRESSION: 1. No acute cardiopulmonary process. Electronically signed by: Maude Stammer MD 07/01/2024 09:28 PM EDT RP Workstation: [id number]    Microbiology: Results for orders placed or performed during the hospital encounter of 07/01/24  Resp panel by RT-PCR (RSV, Flu A&B, Covid) Anterior Nasal Swab     Status: None   Collection Time: 07/01/24  8:56 PM   Specimen: Anterior Nasal Swab  Result Value Ref Range Status   SARS Coronavirus 2 by RT PCR NEGATIVE NEGATIVE Final    Comment: (NOTE) SARS-CoV-2 target nucleic acids are NOT DETECTED.  The SARS-CoV-2 RNA is generally detectable in upper respiratory specimens during the acute phase of infection. The lowest concentration of  SARS-CoV-2 viral copies this assay can detect is 138 copies/mL. A negative result does not preclude SARS-Cov-2 infection and should not be used as the sole basis for treatment or other patient management decisions. A negative result may occur with  improper specimen collection/handling, submission of specimen other than nasopharyngeal swab, presence of viral mutation(s) within the areas targeted by this assay, and inadequate number of viral copies(<138 copies/mL). A negative result must be combined with clinical observations, patient history, and epidemiological information. The expected result is Negative.  Fact Sheet for Patients:  bloggercourse.com  Fact Sheet for Healthcare Providers:  seriousbroker.it  This test is no t yet approved or cleared by the [place]  FDA and  has been authorized for detection and/or diagnosis of SARS-CoV-2 by FDA under an Emergency Use Authorization (EUA). This EUA will remain  in effect (meaning this test can be used) for the duration of the COVID-19 declaration under Section 564(b)(1) of the Act, 21 U.S.C.section 360bbb-3(b)(1), unless the authorization is terminated  or revoked sooner.       Influenza A by PCR NEGATIVE NEGATIVE Final   Influenza B by PCR NEGATIVE NEGATIVE Final    Comment: (NOTE) The Xpert Xpress SARS-CoV-2/FLU/RSV plus assay is intended as an aid in the diagnosis of influenza from Nasopharyngeal swab specimens and should not be used as a sole basis for treatment. Nasal washings and aspirates are unacceptable for Xpert Xpress SARS-CoV-2/FLU/RSV testing.  Fact Sheet for Patients: bloggercourse.com  Fact Sheet for Healthcare Providers: seriousbroker.it  This test is not yet approved or cleared by the [place]   FDA and has been authorized for detection and/or diagnosis of SARS-CoV-2 by FDA under an Emergency Use  Authorization (EUA). This EUA will remain in effect (meaning this test can be used) for the duration of the COVID-19 declaration under Section 564(b)(1) of the Act, 21 U.S.C. section 360bbb-3(b)(1), unless the authorization is terminated or revoked.     Resp Syncytial Virus by PCR NEGATIVE NEGATIVE Final    Comment: (NOTE) Fact Sheet for Patients: bloggercourse.com  Fact Sheet for Healthcare Providers: seriousbroker.it  This test is not yet approved or cleared by the [place]  FDA and has been authorized for detection and/or diagnosis of SARS-CoV-2 by FDA under an Emergency Use Authorization (EUA). This EUA will remain in effect (meaning this test can be used) for the duration of the COVID-19 declaration under Section 564(b)(1) of the Act, 21 U.S.C. section 360bbb-3(b)(1), unless the authorization is terminated or revoked.  Performed at [facility], [address] [place], [place] [address]   Group A Strep by PCR if patient complains of sore throat.     Status: None   Collection Time: 07/01/24  8:56 PM   Specimen: Anterior Nasal Swab; Sterile Swab  Result Value Ref Range Status   Group A Strep by PCR NOT DETECTED NOT DETECTED Final    Comment: Performed at [facility], [address] [place], [place] [address]  Blood Culture (routine x 2)     Status: None (Preliminary result)   Collection Time: 07/02/24 12:20 AM   Specimen: BLOOD  Result Value Ref Range Status   Specimen Description BLOOD LEFT ASSIST CONTROL  Final   Special Requests   Final    BOTTLES DRAWN AEROBIC AND ANAEROBIC Blood Culture results may not be optimal due to an inadequate volume of blood received in culture bottles   Culture   Final    NO GROWTH 2 DAYS Performed at [facility], [address], [place], [place] [address]    Report Status PENDING  Incomplete  Blood Culture (routine x 2)     Status: None (Preliminary  result)   Collection Time: 07/02/24 12:20 AM   Specimen: BLOOD  Result Value Ref Range Status   Specimen Description BLOOD LEFT WRIST  Final   Special Requests   Final    BOTTLES DRAWN AEROBIC AND ANAEROBIC Blood Culture results may not be optimal due to an inadequate volume of blood received in culture bottles   Culture   Final    NO GROWTH 2 DAYS Performed at [facility], [address] [place], [place] [address]    Report Status PENDING  Incomplete  Urine Culture (for pregnant, neutropenic or urologic patients or patients with an indwelling urinary catheter)     Status: Abnormal   Collection Time: 07/02/24  1:52 AM   Specimen: Urine, Clean Catch  Result Value Ref Range Status   Specimen Description   Final    URINE, CLEAN CATCH Performed at [facility], [address], [place], [place] [address]    Special Requests   Final    NONE Performed at [facility], [address] [place], [place] [address]    Culture >=100,000 COLONIES/mL ESCHERICHIA COLI (A)  Final   Report Status 07/04/2024 FINAL  Final   Organism ID, Bacteria ESCHERICHIA COLI (A)  Final      Susceptibility   Escherichia coli - MIC*    AMPICILLIN 4 SENSITIVE Sensitive     CEFAZOLIN  (URINE) Value in next row Sensitive      <=  1 SENSITIVEThis is a modified FDA-approved test that has been validated and its performance characteristics determined by the reporting laboratory.  This laboratory is certified under the Clinical Laboratory Improvement Amendments CLIA as qualified to perform high complexity clinical laboratory testing.    CEFEPIME Value in next row Sensitive      <=1 SENSITIVEThis is a modified FDA-approved test that has been validated and its performance characteristics determined by the reporting laboratory.  This laboratory is certified under the Clinical Laboratory Improvement Amendments CLIA as qualified to perform high complexity clinical laboratory testing.    ERTAPENEM  Value in next row Sensitive      <=1 SENSITIVEThis is a modified FDA-approved test that has been validated and its performance characteristics determined by the reporting laboratory.  This laboratory is certified under the Clinical Laboratory Improvement Amendments CLIA as qualified to perform high complexity clinical laboratory testing.    CEFTRIAXONE Value in next row Sensitive      <=1 SENSITIVEThis is a modified FDA-approved test that has been validated and its performance characteristics determined by the reporting laboratory.  This laboratory is certified under the Clinical Laboratory Improvement Amendments CLIA as qualified to perform high complexity clinical laboratory testing.    CIPROFLOXACIN Value in next row Sensitive      <=1 SENSITIVEThis is a modified FDA-approved test that has been validated and its performance characteristics determined by the reporting laboratory.  This laboratory is certified under the Clinical Laboratory Improvement Amendments CLIA as qualified to perform high complexity clinical laboratory testing.    GENTAMICIN Value in next row Sensitive      <=1 SENSITIVEThis is a modified FDA-approved test that has been validated and its performance characteristics determined by the reporting laboratory.  This laboratory is certified under the Clinical Laboratory Improvement Amendments CLIA as qualified to perform high complexity clinical laboratory testing.    NITROFURANTOIN Value in next row Sensitive      <=1 SENSITIVEThis is a modified FDA-approved test that has been validated and its performance characteristics determined by the reporting laboratory.  This laboratory is certified under the Clinical Laboratory Improvement Amendments CLIA as qualified to perform high complexity clinical laboratory testing.    TRIMETH/SULFA Value in next row Sensitive      <=1 SENSITIVEThis is a modified FDA-approved test that has been validated and its performance characteristics determined by  the reporting laboratory.  This laboratory is certified under the Clinical Laboratory Improvement Amendments CLIA as qualified to perform high complexity clinical laboratory testing.    AMPICILLIN/SULBACTAM Value in next row Sensitive      <=1 SENSITIVEThis is a modified FDA-approved test that has been validated and its performance characteristics determined by the reporting laboratory.  This laboratory is certified under the Clinical Laboratory Improvement Amendments CLIA as qualified to perform high complexity clinical laboratory testing.    PIP/TAZO Value in next row Sensitive      <=4 SENSITIVEThis is a modified FDA-approved test that has been validated and its performance characteristics determined by the reporting laboratory.  This laboratory is certified under the Clinical Laboratory Improvement Amendments CLIA as qualified to perform high complexity clinical laboratory testing.    MEROPENEM Value in next row Sensitive      <=4 SENSITIVEThis is a modified FDA-approved test that has been validated and its performance characteristics determined by the reporting laboratory.  This laboratory is certified under the Clinical Laboratory Improvement Amendments CLIA as qualified to perform high complexity clinical laboratory testing.    * >=100,000 COLONIES/mL ESCHERICHIA  COLI    Labs: CBC: Recent Labs  Lab 07/01/24 2102 07/02/24 0430 07/04/24 0625  WBC 16.5* 13.5* 7.5  NEUTROABS 13.8*  --   --   HGB 15.3* 14.4 13.2  HCT 42.6 39.7 36.2  MCV 86.8 87.4 86.0  PLT 252 213 247   Basic Metabolic Panel: Recent Labs  Lab 07/01/24 2102 07/02/24 0430 07/04/24 0625  NA 137 141 143  K 3.8 3.7 3.5  CL 103 105 104  CO2 19* 25 24  GLUCOSE 114* 104* 126*  BUN 14 13 16   CREATININE 0.65 0.52 0.72  CALCIUM  9.0 8.6* 8.9   Liver Function Tests: Recent Labs  Lab 07/01/24 2102  AST 21  ALT 24  ALKPHOS 67  BILITOT 1.3*  PROT 7.9  ALBUMIN 4.0   CBG: No results for input(s): GLUCAP in the  last 168 hours.  Discharge time spent: greater than 30 minutes.  This record has been created using Conservation officer, historic buildings. Errors have been sought and corrected,but may not always be located. Such creation errors do not reflect on the standard of care.   Signed: Amaryllis Dare, MD [facility] 07/04/2024

## 2024-07-04 NOTE — Plan of Care (Signed)

## 2024-07-05 DIAGNOSIS — G8929 Other chronic pain: Secondary | ICD-10-CM | POA: Diagnosis not present

## 2024-07-05 DIAGNOSIS — M25511 Pain in right shoulder: Secondary | ICD-10-CM | POA: Diagnosis not present

## 2024-07-06 DIAGNOSIS — R052 Subacute cough: Secondary | ICD-10-CM | POA: Diagnosis not present

## 2024-07-06 DIAGNOSIS — Z8551 Personal history of malignant neoplasm of bladder: Secondary | ICD-10-CM | POA: Diagnosis not present

## 2024-07-06 DIAGNOSIS — N200 Calculus of kidney: Secondary | ICD-10-CM | POA: Diagnosis not present

## 2024-07-06 DIAGNOSIS — N12 Tubulo-interstitial nephritis, not specified as acute or chronic: Secondary | ICD-10-CM | POA: Diagnosis not present

## 2024-07-06 DIAGNOSIS — Z8619 Personal history of other infectious and parasitic diseases: Secondary | ICD-10-CM | POA: Diagnosis not present

## 2024-07-07 DIAGNOSIS — N200 Calculus of kidney: Secondary | ICD-10-CM | POA: Diagnosis not present

## 2024-07-07 DIAGNOSIS — M25511 Pain in right shoulder: Secondary | ICD-10-CM | POA: Diagnosis not present

## 2024-07-07 DIAGNOSIS — G8929 Other chronic pain: Secondary | ICD-10-CM | POA: Diagnosis not present

## 2024-07-07 DIAGNOSIS — R399 Unspecified symptoms and signs involving the genitourinary system: Secondary | ICD-10-CM | POA: Diagnosis not present

## 2024-07-07 LAB — CULTURE, BLOOD (ROUTINE X 2)
Culture: NO GROWTH
Culture: NO GROWTH

## 2024-07-08 ENCOUNTER — Ambulatory Visit (HOSPITAL_COMMUNITY): Admission: RE | Admit: 2024-07-08 | Admitting: Urology

## 2024-07-08 ENCOUNTER — Encounter (HOSPITAL_COMMUNITY): Admission: RE | Payer: Self-pay | Source: Ambulatory Visit

## 2024-07-08 SURGERY — CYSTOSCOPY/URETEROSCOPY/HOLMIUM LASER/STENT PLACEMENT
Anesthesia: General | Laterality: Right

## 2024-07-12 DIAGNOSIS — M25511 Pain in right shoulder: Secondary | ICD-10-CM | POA: Diagnosis not present

## 2024-07-12 DIAGNOSIS — G8929 Other chronic pain: Secondary | ICD-10-CM | POA: Diagnosis not present

## 2024-07-14 ENCOUNTER — Other Ambulatory Visit: Payer: Self-pay | Admitting: Urology

## 2024-07-14 DIAGNOSIS — G8929 Other chronic pain: Secondary | ICD-10-CM | POA: Diagnosis not present

## 2024-07-14 DIAGNOSIS — M25511 Pain in right shoulder: Secondary | ICD-10-CM | POA: Diagnosis not present

## 2024-07-16 DIAGNOSIS — Z961 Presence of intraocular lens: Secondary | ICD-10-CM | POA: Diagnosis not present

## 2024-07-16 DIAGNOSIS — H40003 Preglaucoma, unspecified, bilateral: Secondary | ICD-10-CM | POA: Diagnosis not present

## 2024-07-16 DIAGNOSIS — Z9889 Other specified postprocedural states: Secondary | ICD-10-CM | POA: Diagnosis not present

## 2024-07-19 ENCOUNTER — Inpatient Hospital Stay: Payer: PPO | Attending: Hematology and Oncology | Admitting: Hematology and Oncology

## 2024-07-19 VITALS — BP 157/75 | HR 79 | Temp 98.3°F | Resp 17 | Wt 187.2 lb

## 2024-07-19 DIAGNOSIS — Z1721 Progesterone receptor positive status: Secondary | ICD-10-CM | POA: Insufficient documentation

## 2024-07-19 DIAGNOSIS — Z8542 Personal history of malignant neoplasm of other parts of uterus: Secondary | ICD-10-CM | POA: Diagnosis not present

## 2024-07-19 DIAGNOSIS — Z923 Personal history of irradiation: Secondary | ICD-10-CM | POA: Diagnosis not present

## 2024-07-19 DIAGNOSIS — M8580 Other specified disorders of bone density and structure, unspecified site: Secondary | ICD-10-CM | POA: Insufficient documentation

## 2024-07-19 DIAGNOSIS — R162 Hepatomegaly with splenomegaly, not elsewhere classified: Secondary | ICD-10-CM | POA: Insufficient documentation

## 2024-07-19 DIAGNOSIS — Z170 Estrogen receptor positive status [ER+]: Secondary | ICD-10-CM | POA: Insufficient documentation

## 2024-07-19 DIAGNOSIS — Z79811 Long term (current) use of aromatase inhibitors: Secondary | ICD-10-CM | POA: Insufficient documentation

## 2024-07-19 DIAGNOSIS — D0511 Intraductal carcinoma in situ of right breast: Secondary | ICD-10-CM | POA: Insufficient documentation

## 2024-07-19 DIAGNOSIS — Z8551 Personal history of malignant neoplasm of bladder: Secondary | ICD-10-CM | POA: Diagnosis not present

## 2024-07-19 DIAGNOSIS — N200 Calculus of kidney: Secondary | ICD-10-CM | POA: Diagnosis not present

## 2024-07-19 NOTE — Assessment & Plan Note (Signed)
 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 Rosalind 12/24/2020: Genetics: No deleterious mutations 01/23/2021: DCIS low-grade ER/PR positive 02/16/2021: Right lumpectomy: DCIS grade 1, negative margins, ER/PR positive 04/25/2021: Completed radiation 11/30/2020: Secondary polycythemia (negative for MPN panel):    Current treatment: Anastrozole  started 06/02/2021 Anastrozole  toxicities: 1.  Moderate hot flashes improved with taking anastrozole  at bedtime 2. hair thinning   Breast cancer surveillance: 1.  Breast exam 07/19/2024: Benign 2. Mammogram 12/09/2023 [facility]: Benign breast density category B 3.  Bone density 01/24/2023: T-score -2: Osteopenia: Recommended calcium  and vitamin D  and weightbearing exercises 4.  CT chest abdomen pelvis 07/02/2024: Left pyelonephritis, hepatomegaly, mild splenomegaly, mild cardiomegaly  Hospitalization 07/01/2024-07/04/2024: Sepsis due to pyelonephritis   Rotator cuff injury: Following with orthopedics.   Return to clinic in 1 year for follow-up

## 2024-07-19 NOTE — Progress Notes (Signed)
 Patient is awake and  Patient Care Team: Teresa Channel, MD as PCP - General (Family Medicine) Vernetta Berg, MD as Consulting Physician (General Surgery) Dewey Rush, MD as Consulting Physician (Radiation Oncology) Cathlyn JAYSON Nikki Bobie FORBES, MD as Consulting Physician (Obstetrics and Gynecology) Rosalind Zachary NOVAK, MD (Inactive) as Consulting Physician (Urology) Viktoria Comer SAUNDERS, MD as Consulting Physician (Gynecologic Oncology)  DIAGNOSIS:  Encounter Diagnosis  Name Primary?   Ductal carcinoma in situ (DCIS) of right breast Yes    SUMMARY OF ONCOLOGIC HISTORY: Oncology History  Endometrial cancer [facility])   Initial Diagnosis   Endometrial cancer (HCC)   09/27/2019 Imaging   CT A/P: 1. Large hypoenhancing mass in the region of the lower uterus/upper cervix. This appears to expand the endometrial canal the lower uterine segment although the endometrial canal in the uterine fundus is nondilated. As such, endometrial canal/cervical stricture is not considered likely. Rather, imaging features are highly concerning for neoplasm, likely of endometrial origin. 2. No findings of metastatic disease. Specifically, no evidence for lymphadenopathy in the abdomen/pelvis. No peritoneal or omental nodularity. No ascites. 3. Left colonic diverticulosis without diverticulitis   10/09/2020 Imaging   MRI pelvis: Distended endocervical canal, as described above, favoring a lower cervical stenosis/stricture.   Mild associated polypoid soft tissue/debris in the endometrial cavity and along the lower cervix, poorly evaluated on unenhanced MR. While the endometrial soft tissue favors debris, consider hysteroscopy to exclude a cervical mass resulting in Stenosis/stricture.   10/17/2020 Initial Biopsy   EMB: A. ENDOMETRIUM MASS, RESECTION:  - Endometrioid adenocarcinoma.  - See comment.   B. ENDOMETRIUM, CURETTAGE:  - Blood and scanty benign endocervical mucosa.  - No endometrial tissue  identified.   COMMENT:   A. The findings are consistent with FIGO grade 1/2.   11/09/2020 Imaging   Cystoscopy and biopsy with fulguration of papillary lesions (Dr. Rosalind), Robotic-assisted laparoscopic total hysterectomy with bilateral salpingoophorectomy, bilateral pelvic LND, mini-lap for specimen removal  Findings: On EUA, cervix difficult to distinguish, flush with apex; uterus small and mobile. Blood and some tumor noted on D&C. On intra-abdominal entry, normal upper abdominal exam including liver edge, diaphragm, stomach, small and large bowel, omentum. Bilateral ovaries atrophic appearing. Uterus 6-8cm and normal appearing. Mapping unsuccessful to bilateral pelvic basins. Significant retroperitoneal fibrosis noted and significant adhesions between the bladder and cervix. Several mildly prominent pelvic lymph nodes, no frank tumor involvement. No intra-abdominal or pelvic disease noted at the end of surgery.   11/09/2020 Pathology Results   A. BLADDER TUMOR, BIOPSY:  - Low grade papillary urothelial carcinoma.  - No distinct lamina propria invasion identified.  - Muscularis propria is present and not involved.   B. CERVIX, RIGHT POSTERIOR, BIOPSY:  - Fibromuscular tissue devoid of epithelium, negative for carcinoma.   C. CERVIX, LEFT POSTERIOR, BIOPSY:  - Fibromuscular tissue with endocervical glandular type epithelium,  negative for carcinoma.   D. LYMPH NODES, RIGHT PELVIC, EXCISION:  - Five lymph nodes, negative for carcinoma (0/5).   E. LYMPH NODES, LEFT PELVIC, EXCISION:  - Four lymph nodes, negative for carcinoma (0/4).   F. UTERUS, CERVIX AND BILATERAL FALLOPIAN TUBES AND OVARIES, TOTAL  HYSTERECTOMY AND BILATERAL SALPINGO-OOPHORECTOMY:  - Endometrioid carcinoma, FIGO grade 2, with invasion less than half the  myometrium.  - No involvement of uterine serosa, cervical stroma or adnexa.  - See oncology table.   ONCOLOGY TABLE:   UTERUS, CARCINOMA OR CARCINOSARCOMA:  Resection   Procedure: Total hysterectomy and bilateral salpingo-oophorectomy,  Excision of pelvic lymph  nodes  Histologic Type: Endometrioid carcinoma  Histologic Grade: FIGO grade 2  Myometrial Invasion:       Depth of Myometrial Invasion: 2 mm       Myometrial Thickness: 7 mm       Percentage of Myometrial Invasion: < 50%  Uterine Serosa Involvement: Not identified  Cervical Stroma Involvement: Not identified  Other Tissue/Organ Involvement: Not identified  Peritoneal/Ascitic Fluid: Not submitted/unknown  Lymphovascular Invasion: Not identified  Regional Lymph Nodes:       Pelvic Lymph Nodes Examined:            0 Sentinel            9 Non-sentinel            9 Total       Pelvic Lymph Nodes with Metastasis: 0        Para-aortic Lymph Nodes Examined:            0 Sentinel            0 Non-sentinel            0 Total  Distant Metastasis:       Distant Site(s) Involved: Not applicable  Pathologic Stage Classification (pTNM, AJCC 8th Edition): pT1a, pN0  Ancillary Studies: MMR/MSI testing will be ordered  Representative Tumor Block: F4  (v4.2.0.1)    12/24/2020 Genetic Testing   Negative hereditary cancer genetic testing: no pathogenic variants detected in [facility] Common Hereditary Cancers +RNA Panel.  The report date is December 24, 2020.  The Common Hereditary Cancers + RNA Panel offered by [facility] includes sequencing, deletion/duplication, and RNA testing of the following 47 genes: APC, ATM, AXIN2, BARD1, BMPR1A, BRCA1, BRCA2, BRIP1, CDH1, CDK4*, CDKN2A (p14ARF)*, CDKN2A (p16INK4a)*, CHEK2, CTNNA1, DICER1, EPCAM (Deletion/duplication testing only), GREM1 (promoter region deletion/duplication testing only), KIT, MEN1, MLH1, MSH2, MSH3, MSH6, MUTYH, NBN, NF1, NHTL1, PALB2, PDGFRA*, PMS2, POLD1, POLE, PTEN, RAD50, RAD51C, RAD51D, SDHB, SDHC, SDHD, SMAD4, SMARCA4. STK11, TP53, TSC1, TSC2, and VHL.  The following genes were evaluated for sequence changes only: SDHA and HOXB13 c.251G>A  variant only.  RNA analysis is not performed for the * genes.     Ductal carcinoma in situ (DCIS) of right breast  01/30/2021 Initial Diagnosis   Ductal carcinoma in situ (DCIS) of right breast   01/31/2021 Cancer Staging   Staging form: Breast, AJCC 8th Edition - Clinical stage from 01/31/2021: Stage 0 (cTis (DCIS), cN0, cM0, ER+, PR+) - Signed by Layla Sandria BROCKS, MD on 01/31/2021 Stage prefix: Initial diagnosis Nuclear grade: G1   02/16/2021 Definitive Surgery   FINAL MICROSCOPIC DIAGNOSIS:   A. BREAST, RIGHT, LUMPECTOMY:  - Intermediate and low grade ductal carcinoma in situ with calcifications.  - Atypical lobular hyperplasia.  - Biopsy site.  - Fibrocystic change with calcifications.  - Complex sclerosing lesion.  - Intraductal papilloma.  - See oncology table.    03/29/2021 - 04/25/2021 Radiation Therapy   Site Technique Total Dose (Gy) Dose per Fx (Gy) Completed Fx Beam Energies  Breast, Right: Breast_Rt 3D 42.56/42.56 2.66 16/16 10X, 6XFFF  Breast, Right: Breast_Rt_Bst 3D 8/8 2 4/4 6X, 10X     06/02/2021 -  Anti-estrogen oral therapy   Anastrozole      CHIEF COMPLIANT: Follow-up on anastrozole  therapy  HISTORY OF PRESENT ILLNESS:  History of Present Illness Danielle Knapp is a 68 year old female who presents for follow-up of history of breast cancer on anastrozole  therapy.  Also to discuss recent urinary tract infection/pyelonephritis  and upcoming kidney stone surgery.  She has a history of breast cancer and underwent a mammogram in May, which showed a possible cyst in her left breast. A follow-up MRI was negative. She has been on anastrozole  for three years without significant issues, noting that taking the medication at night helps manage hot flashes. She also takes Wellbutrin  as part of her medication regimen.  She experienced a recent urinary tract infection that progressed to a fever of 102F, chills, and body aches. She received treatment and was prescribed a  low dose of ciprofloxacin to continue until her upcoming kidney stone surgery. She is scheduled to have a right kidney stone removed in a week and a half, preferring surgical removal over lithotripsy due to the size of the stone.     ALLERGIES:  is allergic to ciprofloxacin hcl, erythromycin, flagyl [metronidazole], ace inhibitors, and lisinopril.  MEDICATIONS:  Current Outpatient Medications  Medication Sig Dispense Refill   amLODipine  (NORVASC ) 5 MG tablet Take 5 mg by mouth daily.     anastrozole  (ARIMIDEX ) 1 MG tablet TAKE ONE TABLET BY MOUTH ONE TIME DAILY 90 tablet 3   ascorbic acid (VITAMIN C) 500 MG tablet Take 500 mg by mouth daily.     buPROPion  (WELLBUTRIN  XL) 300 MG 24 hr tablet Take 300 mg by mouth every morning.     COLLAGEN PO Take 1 tablet by mouth 2 (two) times daily as needed.     fluticasone (FLONASE) 50 MCG/ACT nasal spray Place 2 sprays into both nostrils daily as needed for allergies.     Multiple Minerals-Vitamins (CAL MAG ZINC +D3 PO) Take 1 tablet by mouth daily.     Multiple Vitamin (MULTIVITAMIN WITH MINERALS) TABS tablet Take 1 tablet by mouth every evening.     oxyCODONE  (ROXICODONE ) 5 MG immediate release tablet Take 1-2 tablets (5-10 mg total) by mouth every 4 (four) hours as needed for moderate pain (pain score 4-6) or severe pain (pain score 7-10). 40 tablet 0   pantoprazole (PROTONIX) 40 MG tablet Take 40 mg by mouth daily.     sertraline  (ZOLOFT ) 100 MG tablet Take 200 mg by mouth every morning.     valACYclovir  (VALTREX ) 1000 MG tablet Take 2 tablets (2,000 mg total) by mouth 2 (two) times daily as needed (for fever blisters). 30 tablet 2   valsartan (DIOVAN) 320 MG tablet Take 320 mg by mouth daily.     zolpidem  (AMBIEN ) 5 MG tablet Take 5 mg by mouth at bedtime.     No current facility-administered medications for this visit.    PHYSICAL EXAMINATION: ECOG PERFORMANCE STATUS: 1 - Symptomatic but completely ambulatory  Vitals:   07/19/24 0859  BP:  (!) 157/75  Pulse: 79  Resp: 17  Temp: 98.3 F (36.8 C)  SpO2: 98%   Filed Weights   07/19/24 0859  Weight: 187 lb 3.2 oz (84.9 kg)    Physical Exam No palpable lumps or nodules of concern  (exam performed in the presence of a chaperone)  LABORATORY DATA:  I have reviewed the data as listed    Latest Ref Rng & Units 07/04/2024    6:25 AM 07/02/2024    4:30 AM 07/01/2024    9:02 PM  CMP  Glucose 70 - 99 mg/dL 873  895  885   BUN 8 - 23 mg/dL 16  13  14    Creatinine 0.44 - 1.00 mg/dL 9.27  9.47  9.34   Sodium 135 - 145 mmol/L 143  141  137   Potassium 3.5 - 5.1 mmol/L 3.5  3.7  3.8   Chloride 98 - 111 mmol/L 104  105  103   CO2 22 - 32 mmol/L 24  25  19    Calcium  8.9 - 10.3 mg/dL 8.9  8.6  9.0   Total Protein 6.5 - 8.1 g/dL   7.9   Total Bilirubin 0.0 - 1.2 mg/dL   1.3   Alkaline Phos 38 - 126 U/L   67   AST 15 - 41 U/L   21   ALT 0 - 44 U/L   24     Lab Results  Component Value Date   WBC 7.5 07/04/2024   HGB 13.2 07/04/2024   HCT 36.2 07/04/2024   MCV 86.0 07/04/2024   PLT 247 07/04/2024   NEUTROABS 13.8 (H) 07/01/2024    ASSESSMENT & PLAN:  Ductal carcinoma in situ (DCIS) of right breast 11/09/2020: TAH with B[facility]: Endometrioid carcinoma T1 a N0 grade 4 left and 5 right pelvic lymph nodes removed loss of MLH1 and PMS2 nuclear expression 11/09/2020: Low-grade noninvasive papillary urothelial cancer, followed by Dr. Rosalind 12/24/2020: Genetics: No deleterious mutations 01/23/2021: DCIS low-grade ER/PR positive 02/16/2021: Right lumpectomy: DCIS grade 1, negative margins, ER/PR positive 04/25/2021: Completed radiation 11/30/2020: Secondary polycythemia (negative for MPN panel):    Current treatment: Anastrozole  started 06/02/2021 Anastrozole  toxicities: 1.  Moderate hot flashes improved with taking anastrozole  at bedtime 2. hair thinning   Breast cancer surveillance: 1.  Breast exam 07/19/2024: Benign 2. Mammogram 12/09/2023 [facility]: Benign breast density category  B 3.  Bone density 01/24/2023: T-score -2: Osteopenia: Recommended calcium  and vitamin D  and weightbearing exercises 4.  CT chest abdomen pelvis 07/02/2024: Left pyelonephritis, hepatomegaly, mild splenomegaly, mild cardiomegaly  Hospitalization 07/01/2024-07/04/2024: Sepsis due to pyelonephritis Kidney stone retrieval being planned for the next couple of weeks   Rotator cuff injury: Following with orthopedics.   Return to clinic in 1 year for follow-up   No orders of the defined types were placed in this encounter.  The patient has a good understanding of the overall plan. she agrees with it. she will call with any problems that may develop before the next visit here.  I personally spent a total of 30 minutes in the care of the patient today including preparing to see the patient, getting/reviewing separately obtained history, performing a medically appropriate exam/evaluation, counseling and educating, placing orders, referring and communicating with other health care professionals, documenting clinical information in the EHR, independently interpreting results, communicating results, and coordinating care.   Viinay K Jennilee Demarco, MD 07/19/24

## 2024-07-20 DIAGNOSIS — M25511 Pain in right shoulder: Secondary | ICD-10-CM | POA: Diagnosis not present

## 2024-07-20 DIAGNOSIS — G8929 Other chronic pain: Secondary | ICD-10-CM | POA: Diagnosis not present

## 2024-07-22 DIAGNOSIS — G8929 Other chronic pain: Secondary | ICD-10-CM | POA: Diagnosis not present

## 2024-07-22 DIAGNOSIS — M25511 Pain in right shoulder: Secondary | ICD-10-CM | POA: Diagnosis not present

## 2024-07-26 DIAGNOSIS — M25511 Pain in right shoulder: Secondary | ICD-10-CM | POA: Diagnosis not present

## 2024-07-26 DIAGNOSIS — G8929 Other chronic pain: Secondary | ICD-10-CM | POA: Diagnosis not present

## 2024-07-26 NOTE — Progress Notes (Signed)
 [facility]                    PT Daily Progress Note  Patient's Name:Danielle Knapp                        MRN #[id number]      Date:07/26/2024  Visit Number: 13  Progress Related to Long and Short Term Goals:                                                                                            [] AROM:   Right shoulder flexion = 134 [] PROM:     [] Strength:     [] Pain:     while- [] at rest    [] During activity:  []  Functional goal:    []  Other:    []  Balance:     Patient / Family Education:    []  Progressed HEP to include:  []  Reviewed previous HEP:    ___________________________________________________________________________________   Skilled Service Provided:  To: [x]  right   []  left     []  bilateral    []   core/body stability and balance  []  neck   [x]   shoulder   []  upper arm  []  elbow   []  lower arm  []  wrist   []  hand   []   fingers  []  back     []  hip    []  upper leg    []  knee  []  lower leg   []   ankle  foot            Ther Ex Therapeutic Procedure CPT 97110 : 45 minutes   Manual Tx Manual Therapy CPT 97140: 15 minutes   Vaso-pneumatic compression      Ice / Heat  Hot / Cold Modality  CPT 97010: Performed                     Infrared         US                 [x]  Inc ROM [x] Inc joint mob [] Dec swelling [x] Dec pain Setting:   [] Inc Circ  [x]  Inc Flex [x]  Inc Flex [] Dec inflam [x] Dec inflam [] Inc Healing [] Dec pain  []  Inc  Bal [x]  Inc ROM [] Dec edema [] Dec swelling [] Dec pain [] Dec swelling  [x]  Inc Strength [] Astym Level      []  1        []  2      []  3 [] Inc circulation [] Inc circulation [] Dec inflam  Progressive  HEP          []   [] tension/ spasms/soft tissue mob Temp:         36 F [] Inc soft tissue mobility    [] Inc Posture [] Correct malaign       Subjective:     The patient reports increased right shoulder soreness following last session. Treatment notes:   Therapeutic exercises as noted on log.  Decreased resistance  with elastic band and IR and ER.  Continued manual treatment for right shoulder ROM, mobility and  flexibility.  No crepitus or change in presentation noted with manual treatment.  Applied a cold pack to the right shoulder posttreatment. Assessment of Treatment:    The patient was more guarded with active assisted and passive movements today.  Patient did not reach as high on the finger ladder.  The patient is more apprehensive with shoulder flexion/scaption movement today.  Rotation is better tolerated. Patient's Response to Treatment:  [] Dec pain  [] Inc pain    []  Inc flexibility    [] Inc endurance   [x]  Inc strength     [x]  Inc AROM      [x]  Inc PROM       []  Dec Spasms    [] Inc Posture / alignment     []  Inc Joint Mobility   []  Other:   Functional Improvement Noted:  Increased ability to  []  walk   []  stand for longer periods   []  dress with less help    []  lift    [x]   reach   []   bend  []  Other:  Remaining Impairment Requiring Continued Treatment:   [x]  Dec  flexibility   [x]  Dec  ROM   [x]   pain   [x]  Dec strength  [x]   weakness    []   Dec balance   [x]   swelling   [x]   inflammation   []   Spasm    []  posture/alignment    []   Other:  Plan: [x]   Continue Plan of Care        []   Add:         []   Discharge after next visit    []   Discharge to HEP   []  Other: UE Treatment Log    Date  10/21 10/27 11/3 11/5 11/10 11/12 11/18  11/20 11/24   UBE            wall walk   x7 x10 10x 10x 10x 10x 10x   Pulley   2x5' 2x5' 2x5' 2x5' 2x5' 2x5' 2x5'   Flexion            Scaption            Abduction            T-Band IR        RTB 3x10 YTB 3x10   T-Band ER at 0 abd        RTB 3x10 YTB 3x10   Scap Retraction        RTB 3x10 RTB 3x10   Shoulder Extension        RTB 3x10 RTB 3x10   Lat Pulls            F. M.  Rows            Tricep Ext            Bicep Curls            Shoulder Shrugs            wall pushups            S/L ER            Horiz Abd @ 90 prone/stand            Horiz Abd @ 120  prone/stand            Bentover Extension       3x10 3x10 3x10   Bentover Rows       3x10 3x10 3x10   Serratus Punch Outs  3x10 3x10   Serratus Bear Hugs            PNFD2 Flex/Ext            Supine cane flexion & ER   3x10 ea 3x10 ea 3x10 ea 3x10 ea 3x10 ea 3x10 ea 3x10 ea   Table slides for Flex & ER    3x10 ea 3x10 ea       Seated cane flexion       3x10 3x10 3x10                                       Manual-   right shld PROM all planes, GH post & inf glides, scapular rot, protraction & retraction  30' 30' 25' 20' 20' 20' 20' 15' 15'   Ultrasound/ Infrared            Cold pack to right shoulder 10' 10' 10' 10' 10' 10' 10' 10' 10'     PT Billing Documentation Date of Onset: 05/18/24 Visit Number: 13       Frequently Used Timed Codes Therapeutic Procedure CPT 97110 : 45 minutes Manual Therapy CPT 97140: 15 minutes        Hot / Cold Modality  CPT 97010: Performed      Total Treatment Time: 70 minutes  This note was generated in part with voice recognition software and I apologize for any typographical errors that were not detected and corrected.  If the patient does not return for follow up visit(s) related to this episode of care, this note will serve as their discharge note from physical therapy.    Therapist's : Adine JULIANNA Sous, PT

## 2024-07-28 DIAGNOSIS — M25511 Pain in right shoulder: Secondary | ICD-10-CM | POA: Diagnosis not present

## 2024-07-28 DIAGNOSIS — G8929 Other chronic pain: Secondary | ICD-10-CM | POA: Diagnosis not present

## 2024-08-01 DIAGNOSIS — Z853 Personal history of malignant neoplasm of breast: Secondary | ICD-10-CM | POA: Diagnosis not present

## 2024-08-01 DIAGNOSIS — F411 Generalized anxiety disorder: Secondary | ICD-10-CM | POA: Diagnosis not present

## 2024-08-01 DIAGNOSIS — E663 Overweight: Secondary | ICD-10-CM | POA: Diagnosis not present

## 2024-08-01 DIAGNOSIS — Z8542 Personal history of malignant neoplasm of other parts of uterus: Secondary | ICD-10-CM | POA: Diagnosis not present

## 2024-08-01 NOTE — Patient Instructions (Addendum)
SURGICAL WAITING ROOM VISITATION Patients having surgery or a procedure may have no more than 2 support people in the waiting area - these visitors may rotate in the visitor waiting room.   If the patient needs to stay at the hospital during part of their recovery, the visitor guidelines for inpatient rooms apply.  PRE-OP VISITATION  Pre-op nurse will coordinate an appropriate time for 1 support person to accompany the patient in pre-op.  This support person may not rotate.  This visitor will be contacted when the time is appropriate for the visitor to come back in the pre-op area.  Please refer to the [facility] website for the visitor guidelines for Inpatients (after your surgery is over and you are in a regular room).  You are not required to quarantine at this time prior to your surgery. However, you must do this: Hand Hygiene often Do NOT share personal items Notify your provider if you are in close contact with someone who has COVID or you develop fever 100.4 or greater, new onset of sneezing, cough, sore throat, shortness of breath or body aches.  If you test positive for Covid or have been in contact with anyone that has tested positive in the last 10 days please notify you surgeon.    Your procedure is scheduled on:  THURSDAY  08-12-2024  Report to [facility] Main Entrance: [address] entrance where the [employer] is available.   Report to admitting at:  08:15   AM  Call this number if you have any questions or problems the morning of surgery [phone]  DO NOT EAT OR DRINK ANYTHING AFTER MIDNIGHT THE NIGHT PRIOR TO YOUR SURGERY / PROCEDURE.   FOLLOW  ANY ADDITIONAL PRE OP INSTRUCTIONS YOU RECEIVED FROM YOUR SURGEON'S OFFICE!!!   Oral Hygiene is also important to reduce your risk of infection.        Remember - BRUSH YOUR TEETH THE MORNING OF SURGERY WITH YOUR REGULAR TOOTHPASTE  Do NOT smoke after Midnight the night before surgery.  STOP TAKING all Vitamins,  Herbs and supplements 1 week before your surgery.   Take ONLY these medicines the morning of surgery with A SIP OF WATER : Medication on DOS: Pantoprazole , Cephalexin , amlodipine , Anastrozole , Sertraline , Bupropion ,  Oxycodone  if needed for pain. Flonase  nasal spray if needed.    DO NOT TAKE Valsartan the morning of your surgery.                    You may not have any metal on your body including hair pins, jewelry, and body piercing  Do not wear make-up, lotions, powders, perfumes or deodorant  Do not wear nail polish including gel and S&S, artificial / acrylic nails, or any other type of covering on natural nails including finger and toenails. If you have artificial nails, gel coating, etc., that needs to be removed by a nail salon, Please have this removed prior to surgery. Not doing so may mean that your surgery could be cancelled or delayed if the Surgeon or anesthesia staff feels like they are unable to monitor you safely.   Do not shave 48 hours prior to surgery to avoid nicks in your skin which may contribute to postoperative infections.   Contacts, Hearing Aids, dentures or bridgework may not be worn into surgery. DENTURES WILL BE REMOVED PRIOR TO SURGERY PLEASE DO NOT APPLY Poly grip OR ADHESIVES!!!  Patients discharged on the day of surgery will not be allowed to drive home.  Someone NEEDS to stay with you for the first 24 hours after anesthesia.  Do not bring your home medications to the hospital. The Pharmacy will dispense medications listed on your medication list to you during your admission in the Hospital.  Please read over the following fact sheets you were given: IF YOU HAVE QUESTIONS ABOUT YOUR PRE-OP INSTRUCTIONS, PLEASE CALL [phone].   [place] - Preparing for Surgery      Before surgery, you can play an important role.  Because skin is not sterile, your skin needs to be as free of germs as possible.  You can reduce the number of germs on your skin by washing  with CHG (chlorahexidine gluconate) soap before surgery.  CHG is an antiseptic cleaner which kills germs and bonds with the skin to continue killing germs even after washing. Please DO NOT use if you have an allergy to CHG or antibacterial soaps.  If your skin becomes reddened/irritated stop using the CHG and inform your nurse when you arrive at Short Stay. Do not shave (including legs and underarms) for at least 48 hours prior to the first CHG shower.  You may shave your face/neck.  Please follow these instructions carefully:  1.  Shower with CHG Soap the night before surgery ONLY (DO NOT USE THE CHG SOAP THE MORNING OF SURGERY).  2.  If you choose to wash your hair, wash your hair first as usual with your normal  shampoo.  3.  After you shampoo, rinse your hair and body thoroughly to remove the shampoo.                             4.  Use CHG as you would any other liquid soap.  You can apply chg directly to the skin and wash.  Gently with a scrungie or clean washcloth.  5.  Apply the CHG Soap to your body ONLY FROM THE NECK DOWN.   Do not use on face/ open                           Wound or open sores. Avoid contact with eyes, ears mouth and genitals (private parts).                       Wash face,  Genitals (private parts) with your normal soap.             6.  Wash thoroughly, paying special attention to the area where your  surgery  will be performed.  7.  Thoroughly rinse your body with warm water  from the neck down.  8.  DO NOT shower/wash with your normal soap after using and rinsing off the CHG Soap.                9.  Pat yourself dry with a clean towel.            10.  Wear clean pajamas.            11.  Place clean sheets on your bed the night of your first shower and do not  sleep with pets.  Day of Surgery : Do not apply any CHG, lotions/deodorants the morning of surgery.  Please wear clean clothes to the hospital/surgery center.   FAILURE TO FOLLOW THESE INSTRUCTIONS MAY  RESULT IN THE CANCELLATION OF YOUR SURGERY  PATIENT SIGNATURE_________________________________  NURSE SIGNATURE__________________________________  ________________________________________________________________________ 

## 2024-08-01 NOTE — Progress Notes (Signed)
 COVID Vaccine received:  []  No [x]  Yes Date of any COVID positive Test in last 90 days:  PCP - Montie Pizza, MD  Cardiologist - Maude Emmer, MD (LOV 11-07-2017) patient to see prn  Oncology- Mackey Chad, MD   Chest x-ray - 07-01-24 1v Epic EKG - 07-05-24  Epic  Stress Test -  ECHO - 10-16-2017  Cardiac Cath - [facility] / C[facility] 11-03-2017 by Dr. Dann,  CT Coronary Calcium  score:   Pacemaker / ICD device [x]  No []  Yes   Spinal Cord Stimulator:[x]  No []  Yes       History of Sleep Apnea? []  No [x]  Yes   CPAP used?- [x]  No []  Yes    Medication on DOS: Pantoprazole , Cephalexin , amlodipine , Anastrozole , Sertraline , Bupropion ,  Oxycodone  prn, Flonase  nasal spray prn  Hold DOS:  Valsartan,   Patient has: [x]  NO Hx DM   []  Pre-DM   []  DM1  []   DM2 Does the patient monitor blood sugar?   [x]  N/A   []  No []  Yes   Blood Thinner / Instructions:  none Aspirin  Instructions:  none  Activity level: Able to walk up 2 flights of stairs without becoming significantly short of breath or having chest pain?  []  No   []    Yes  Patient can perform ADLs without assistance. []  No   []   Yes  Comments: original Surgery RS from 07-08-24 d/t Hospitalization (07-01-24 - 07-04-24) for Sepsis from UTI, acute pyelonephritis,   Anesthesia review:  mild nonobstructive CAD - [facility] 2019, HTN, Polycythemia, Hx cancer- endometrial, breast, and bladder,   s/p Right rotator cuff repair at [facility] 05-18-24, MDD   Patient denies any S&S of respiratory illness or Covid - no shortness of breath, fever, cough or chest pain at PAT appointment.  Patient verbalized understanding and agreement to the Pre-Surgical Instructions that were given to them at this PAT appointment. Patient was also educated of the need to review these PAT instructions again prior to her surgery.I reviewed the appropriate phone numbers to call if they have any and questions or concerns.

## 2024-08-02 DIAGNOSIS — G8929 Other chronic pain: Secondary | ICD-10-CM | POA: Diagnosis not present

## 2024-08-02 DIAGNOSIS — M25511 Pain in right shoulder: Secondary | ICD-10-CM | POA: Diagnosis not present

## 2024-08-03 ENCOUNTER — Encounter (HOSPITAL_COMMUNITY): Payer: Self-pay

## 2024-08-03 ENCOUNTER — Other Ambulatory Visit: Payer: Self-pay

## 2024-08-03 ENCOUNTER — Encounter (HOSPITAL_COMMUNITY)
Admission: RE | Admit: 2024-08-03 | Discharge: 2024-08-03 | Disposition: A | Source: Ambulatory Visit | Attending: Urology

## 2024-08-03 VITALS — BP 133/69 | HR 68 | Temp 97.9°F | Resp 16 | Ht 66.0 in | Wt 182.0 lb

## 2024-08-03 DIAGNOSIS — N200 Calculus of kidney: Secondary | ICD-10-CM | POA: Diagnosis not present

## 2024-08-03 DIAGNOSIS — Z01818 Encounter for other preprocedural examination: Secondary | ICD-10-CM

## 2024-08-03 DIAGNOSIS — A4150 Gram-negative sepsis, unspecified: Secondary | ICD-10-CM | POA: Diagnosis not present

## 2024-08-03 DIAGNOSIS — I10 Essential (primary) hypertension: Secondary | ICD-10-CM | POA: Insufficient documentation

## 2024-08-03 DIAGNOSIS — M25511 Pain in right shoulder: Secondary | ICD-10-CM | POA: Diagnosis not present

## 2024-08-03 DIAGNOSIS — N390 Urinary tract infection, site not specified: Secondary | ICD-10-CM | POA: Insufficient documentation

## 2024-08-03 DIAGNOSIS — Z01812 Encounter for preprocedural laboratory examination: Secondary | ICD-10-CM | POA: Diagnosis not present

## 2024-08-03 DIAGNOSIS — F331 Major depressive disorder, recurrent, moderate: Secondary | ICD-10-CM | POA: Diagnosis not present

## 2024-08-03 DIAGNOSIS — F411 Generalized anxiety disorder: Secondary | ICD-10-CM | POA: Diagnosis not present

## 2024-08-03 LAB — CBC
HCT: 44.1 % (ref 36.0–46.0)
Hemoglobin: 15.7 g/dL — ABNORMAL HIGH (ref 12.0–15.0)
MCH: 31.5 pg (ref 26.0–34.0)
MCHC: 35.6 g/dL (ref 30.0–36.0)
MCV: 88.6 fL (ref 80.0–100.0)
Platelets: 247 K/uL (ref 150–400)
RBC: 4.98 MIL/uL (ref 3.87–5.11)
RDW: 13.9 % (ref 11.5–15.5)
WBC: 7.7 K/uL (ref 4.0–10.5)
nRBC: 0 % (ref 0.0–0.2)

## 2024-08-03 LAB — COMPREHENSIVE METABOLIC PANEL WITH GFR
ALT: 19 U/L (ref 0–44)
AST: 21 U/L (ref 15–41)
Albumin: 4.6 g/dL (ref 3.5–5.0)
Alkaline Phosphatase: 83 U/L (ref 38–126)
Anion gap: 9 (ref 5–15)
BUN: 13 mg/dL (ref 8–23)
CO2: 29 mmol/L (ref 22–32)
Calcium: 10 mg/dL (ref 8.9–10.3)
Chloride: 104 mmol/L (ref 98–111)
Creatinine, Ser: 0.78 mg/dL (ref 0.44–1.00)
GFR, Estimated: >60 mL/min (ref >60)
Glucose, Bld: 83 mg/dL (ref 70–99)
Potassium: 4.2 mmol/L (ref 3.5–5.1)
Sodium: 143 mmol/L (ref 135–145)
Total Bilirubin: 0.4 mg/dL (ref 0.0–1.2)
Total Protein: 7.2 g/dL (ref 6.5–8.1)

## 2024-08-05 DIAGNOSIS — N200 Calculus of kidney: Secondary | ICD-10-CM | POA: Diagnosis not present

## 2024-08-05 DIAGNOSIS — M25511 Pain in right shoulder: Secondary | ICD-10-CM | POA: Diagnosis not present

## 2024-08-05 DIAGNOSIS — G8929 Other chronic pain: Secondary | ICD-10-CM | POA: Diagnosis not present

## 2024-08-09 DIAGNOSIS — G8929 Other chronic pain: Secondary | ICD-10-CM | POA: Diagnosis not present

## 2024-08-09 DIAGNOSIS — M25511 Pain in right shoulder: Secondary | ICD-10-CM | POA: Diagnosis not present

## 2024-08-12 ENCOUNTER — Ambulatory Visit (HOSPITAL_COMMUNITY)
Admission: RE | Admit: 2024-08-12 | Discharge: 2024-08-12 | Disposition: A | Source: Ambulatory Visit | Attending: Urology | Admitting: Urology

## 2024-08-12 ENCOUNTER — Encounter (HOSPITAL_COMMUNITY): Payer: Self-pay | Admitting: Urology

## 2024-08-12 ENCOUNTER — Encounter (HOSPITAL_COMMUNITY): Payer: Self-pay | Admitting: Medical

## 2024-08-12 ENCOUNTER — Ambulatory Visit (HOSPITAL_COMMUNITY)

## 2024-08-12 ENCOUNTER — Other Ambulatory Visit: Payer: Self-pay

## 2024-08-12 ENCOUNTER — Encounter (HOSPITAL_COMMUNITY): Admission: RE | Disposition: A | Payer: Self-pay | Source: Ambulatory Visit | Attending: Urology

## 2024-08-12 DIAGNOSIS — N200 Calculus of kidney: Secondary | ICD-10-CM

## 2024-08-12 HISTORY — PX: CYSTOSCOPY W/ RETROGRADES: SHX1426

## 2024-08-12 HISTORY — PX: CYSTOSCOPY/URETEROSCOPY/HOLMIUM LASER/STENT PLACEMENT: SHX6546

## 2024-08-12 SURGERY — CYSTOSCOPY/URETEROSCOPY/HOLMIUM LASER/STENT PLACEMENT
Anesthesia: General | Laterality: Right

## 2024-08-12 MED ORDER — TRAMADOL HCL 50 MG PO TABS: 50.0000 mg | ORAL_TABLET | Freq: Four times a day (QID) | ORAL | 0 refills | Status: AC | PRN

## 2024-08-12 MED ORDER — ONDANSETRON HCL 4 MG/2ML IJ SOLN
INTRAMUSCULAR | Status: DC | PRN
  Administered 2024-08-12: 4 mg via INTRAVENOUS

## 2024-08-12 MED ORDER — CHLORHEXIDINE GLUCONATE 0.12 % MT SOLN
15.0000 mL | Freq: Once | OROMUCOSAL | Status: AC
  Administered 2024-08-12: 15 mL via OROMUCOSAL

## 2024-08-12 MED ORDER — PHENAZOPYRIDINE HCL 200 MG PO TABS: 200.0000 mg | ORAL_TABLET | Freq: Three times a day (TID) | ORAL | 0 refills | Status: AC | PRN

## 2024-08-12 MED ORDER — FENTANYL CITRATE (PF) 50 MCG/ML IJ SOSY
PREFILLED_SYRINGE | INTRAMUSCULAR | Status: AC
  Filled 2024-08-12: qty 2

## 2024-08-12 MED ORDER — MIDAZOLAM HCL 2 MG/2ML IJ SOLN
INTRAMUSCULAR | Status: AC
  Filled 2024-08-12: qty 2

## 2024-08-12 MED ORDER — FENTANYL CITRATE (PF) 50 MCG/ML IJ SOSY
PREFILLED_SYRINGE | INTRAMUSCULAR | Status: AC
  Filled 2024-08-12: qty 1

## 2024-08-12 MED ORDER — PROPOFOL 10 MG/ML IV BOLUS
INTRAVENOUS | Status: AC
  Filled 2024-08-12: qty 20

## 2024-08-12 MED ORDER — FENTANYL CITRATE (PF) 50 MCG/ML IJ SOSY
25.0000 ug | PREFILLED_SYRINGE | INTRAMUSCULAR | Status: DC | PRN
  Administered 2024-08-12 (×3): 50 ug via INTRAVENOUS

## 2024-08-12 MED ORDER — LACTATED RINGERS IV SOLN: INTRAVENOUS | Status: DC

## 2024-08-12 MED ORDER — OXYCODONE HCL 5 MG PO TABS
ORAL_TABLET | ORAL | Status: AC
  Filled 2024-08-12: qty 1

## 2024-08-12 MED ORDER — LIDOCAINE HCL (CARDIAC) PF 100 MG/5ML IV SOSY
PREFILLED_SYRINGE | INTRAVENOUS | Status: DC | PRN
  Administered 2024-08-12: 100 mg via INTRAVENOUS

## 2024-08-12 MED ORDER — LIDOCAINE HCL (PF) 2 % IJ SOLN
INTRAMUSCULAR | Status: AC
  Filled 2024-08-12: qty 5

## 2024-08-12 MED ORDER — IOHEXOL 300 MG/ML  SOLN
INTRAMUSCULAR | Status: DC | PRN
  Administered 2024-08-12: 7 mL

## 2024-08-12 MED ORDER — FENTANYL CITRATE (PF) 100 MCG/2ML IJ SOLN
INTRAMUSCULAR | Status: AC
  Filled 2024-08-12: qty 2

## 2024-08-12 MED ORDER — ONDANSETRON HCL 4 MG/2ML IJ SOLN
INTRAMUSCULAR | Status: AC
  Filled 2024-08-12: qty 2

## 2024-08-12 MED ORDER — SODIUM CHLORIDE 0.9 % IR SOLN
Status: DC | PRN
  Administered 2024-08-12: 6000 mL via INTRAVESICAL

## 2024-08-12 MED ORDER — ACETAMINOPHEN 500 MG PO TABS
1000.0000 mg | ORAL_TABLET | Freq: Once | ORAL | Status: AC
  Administered 2024-08-12: 1000 mg via ORAL

## 2024-08-12 MED ORDER — MIDAZOLAM HCL 5 MG/5ML IJ SOLN
INTRAMUSCULAR | Status: DC | PRN
  Administered 2024-08-12: 2 mg via INTRAVENOUS

## 2024-08-12 MED ORDER — ORAL CARE MOUTH RINSE: 15.0000 mL | Freq: Once | OROMUCOSAL | Status: AC

## 2024-08-12 MED ORDER — CEFAZOLIN SODIUM-DEXTROSE 2-4 GM/100ML-% IV SOLN
2.0000 g | INTRAVENOUS | Status: AC
  Administered 2024-08-12: 2 g via INTRAVENOUS
  Filled 2024-08-12: qty 100

## 2024-08-12 MED ORDER — TAMSULOSIN HCL 0.4 MG PO CAPS: 0.4000 mg | ORAL_CAPSULE | Freq: Every day | ORAL | 0 refills | Status: AC

## 2024-08-12 MED ORDER — PHENYLEPHRINE 80 MCG/ML (10ML) SYRINGE FOR IV PUSH (FOR BLOOD PRESSURE SUPPORT)
PREFILLED_SYRINGE | INTRAVENOUS | Status: AC
  Filled 2024-08-12: qty 10

## 2024-08-12 MED ORDER — FENTANYL CITRATE (PF) 100 MCG/2ML IJ SOLN
INTRAMUSCULAR | Status: DC | PRN
  Administered 2024-08-12 (×2): 25 ug via INTRAVENOUS
  Administered 2024-08-12: 50 ug via INTRAVENOUS
  Administered 2024-08-12 (×2): 25 ug via INTRAVENOUS

## 2024-08-12 MED ORDER — DEXAMETHASONE SOD PHOSPHATE PF 10 MG/ML IJ SOLN
INTRAMUSCULAR | Status: DC | PRN
  Administered 2024-08-12: 5 mg via INTRAVENOUS

## 2024-08-12 MED ORDER — ACETAMINOPHEN 10 MG/ML IV SOLN: 1000.0000 mg | Freq: Once | INTRAVENOUS | Status: DC | PRN

## 2024-08-12 MED ORDER — OXYCODONE HCL 5 MG/5ML PO SOLN: 5.0000 mg | Freq: Once | ORAL | Status: AC | PRN

## 2024-08-12 MED ORDER — OXYCODONE HCL 5 MG PO TABS
5.0000 mg | ORAL_TABLET | Freq: Once | ORAL | Status: AC | PRN
  Administered 2024-08-12: 5 mg via ORAL

## 2024-08-12 MED ORDER — PHENYLEPHRINE 80 MCG/ML (10ML) SYRINGE FOR IV PUSH (FOR BLOOD PRESSURE SUPPORT)
PREFILLED_SYRINGE | INTRAVENOUS | Status: DC | PRN
  Administered 2024-08-12: 80 ug via INTRAVENOUS

## 2024-08-12 MED ORDER — ACETAMINOPHEN 500 MG PO TABS
ORAL_TABLET | ORAL | Status: AC
  Filled 2024-08-12: qty 2

## 2024-08-12 MED ORDER — PROPOFOL 10 MG/ML IV BOLUS
INTRAVENOUS | Status: DC | PRN
  Administered 2024-08-12: 120 mg via INTRAVENOUS
  Administered 2024-08-12: 50 mg via INTRAVENOUS
  Administered 2024-08-12: 40 mg via INTRAVENOUS

## 2024-08-12 MED ORDER — ONDANSETRON HCL 4 MG/2ML IJ SOLN
4.0000 mg | Freq: Once | INTRAMUSCULAR | Status: AC | PRN
  Administered 2024-08-12: 4 mg via INTRAVENOUS

## 2024-08-12 SURGICAL SUPPLY — 20 items
BAG COUNTER SPONGE SURGICOUNT (BAG) IMPLANT
BAG URO CATCHER STRL LF (MISCELLANEOUS) ×1 IMPLANT
BASKET ZERO TIP NITINOL 2.4FR (BASKET) IMPLANT
CATH URETL OPEN 5X70 (CATHETERS) ×1 IMPLANT
CLOTH BEACON ORANGE TIMEOUT ST (SAFETY) ×1 IMPLANT
EXTRACTOR STONE NITINOL NGAGE (UROLOGICAL SUPPLIES) IMPLANT
GLOVE SURG LX STRL 7.5 STRW (GLOVE) ×1 IMPLANT
GOWN STRL REUS W/ TWL XL LVL3 (GOWN DISPOSABLE) ×1 IMPLANT
GUIDEWIRE ANG ZIPWIRE 038X150 (WIRE) IMPLANT
GUIDEWIRE STR DUAL SENSOR (WIRE) ×1 IMPLANT
KIT TURNOVER KIT A (KITS) ×1 IMPLANT
MANIFOLD NEPTUNE II (INSTRUMENTS) ×1 IMPLANT
NS IRRIG 1000ML POUR BTL (IV SOLUTION) IMPLANT
PACK CYSTO (CUSTOM PROCEDURE TRAY) ×1 IMPLANT
SHEATH NAVIGATOR HD 11/13X28 (SHEATH) IMPLANT
SHEATH NAVIGATOR HD 11/13X36 (SHEATH) IMPLANT
STENT URET 6FRX24 CONTOUR (STENTS) IMPLANT
TRACTIP FLEXIVA PULS ID 200XHI (Laser) IMPLANT
TUBING CONNECTING 10 (TUBING) ×1 IMPLANT
TUBING UROLOGY SET (TUBING) ×1 IMPLANT

## 2024-08-12 NOTE — Op Note (Signed)
 Preoperative diagnosis:  Right nonobstructing renal stone History of low-grade bladder cancer  Postoperative diagnosis:  Same  Procedure: Cystoscopy, right retrograde pyelogram with interpretation Right ureteroscopy, laser lithotripsy and stone removal Right ureteral stent placement  Surgeon: Morene MICAEL Salines, MD  Anesthesia: General  Complications: None  Intraoperative findings:  #1: The patient's retrograde pyelogram demonstrated normal caliber ureter with no filling defects or other abnormalities.  There was no hydroureteronephrosis. #2: The bladder mucosa was normal-appearing with orthotopic ureteral orifice ease and no evidence of recurrence of her bladder cancer. #3: The patient's ureter was very tight/narrow and in order to access it I had to dilate it with the semirigid ureteroscope and then subsequently dilated it with the ureteral access sheath (11/13 French). #4: The stone was fragmented and removed from the upper pole of the right kidney, and a 6 French x 24 cm double-J ureteral stent was placed in the right urinary tract  EBL: Minimal  Specimens: Stone composition analysis  Indication: Danielle Knapp is a 68 y.o. patient with nonobstructing right renal stone.  After reviewing the management options for treatment, he elected to proceed with the above surgical procedure(s). We have discussed the potential benefits and risks of the procedure, side effects of the proposed treatment, the likelihood of the patient achieving the goals of the procedure, and any potential problems that might occur during the procedure or recuperation. Informed consent has been obtained.  Description of procedure:  Consent was obtained in the preoperative holding area.  He was then brought back to the operating room placed on the table in supine position.  General anesthesia was then induced endotracheal tube was inserted.  She was placed in the dorsolithotomy position and prepped and draped in  the routine sterile fashion.  A timeout was subsequently performed.  A 21 French 30 degree cystoscope was gently passed to the patient's urethra and into the bladder under visual guidance.  Cystoscopy was performed and at 360 degree fashion demonstrating no recurrence of her bladder cancer.  The above findings were noted.  I then used a 5 French open-ended ureteral catheter and performed a right retrograde pyelogram with 10 cc of Omnipaque  contrast with the above findings.  I then advanced a wire through the open-ended catheter and into the right collecting system.  The open-ended catheter was subsequently removed.  The bladder was drained.  I then attempted to pass the flexible ureteroscope up into the patient's right collecting system but was unable to get past the ureteral orifice.  I attempted to use a second wire and railroad up through the 2 wires but I was unable to.  I subsequently used a semirigid ureteroscope and advanced it up to the proximal ureter atraumatically with no significant findings.  I advanced a second wire through the scope and removed the scope over the wire.  I then dilated the ureter with the inner portion of an 11/13 French ureteral access sheath.  I then used both the inner and outer portion of the access sheath and advanced it to the proximal ureter removing the inner portion and the second wire.  I then was able to get the flexible ureteroscope into the patient's right kidney.  Pyeloscopy demonstrated a stone in the upper pole.  I was unable to grasp this without lasering it first.  I broke it into 3 or 4 smaller pieces and then remove these pieces with an engage basket.  There were no additional stone fragments.  I subsequently explored the remaining aspect  of the kidney and reevaluated the ureter on the way out removing the access sheath simultaneously.  I noted no significant other abnormality/trauma or residual stone fragments.  I then advanced a 24 cm time 6 French  double-J stent over the wire and under fluoroscopic guidance advance it all the way up into the patient's upper pole.  Once it was noted to be well within the kidney I advanced the stent to the urethral meatus prior to removing the wire.  Once the wire was out I advanced the beak of the cystoscope into the patient's bladder and confirmed a nice curl within the patient's bladder with fluoroscopy.  I pulled the stent tether through the patient's urethra and tucked into her bladder.  The patient was subsequently extubated returned to PACU in stable condition.  Disposition: The patient is being discharged home and instructed to remove the stent on Monday, December 16.  She will subsequently follow-up with me in 6 weeks with a renal ultrasound prior

## 2024-08-12 NOTE — Interval H&P Note (Signed)
 History and Physical Interval Note:  08/12/2024 9:35 AM  Danielle Knapp  has presented today for surgery, with the diagnosis of RIGHT RENAL CALCULI.  The various methods of treatment have been discussed with the patient and family. After consideration of risks, benefits and other options for treatment, the patient has consented to  Procedures with comments: CYSTOSCOPY/URETEROSCOPY/HOLMIUM LASER/STENT PLACEMENT (Right) - CYSTOSCOPY/RIGHT URETEROSCOPY/HOLMIUM LASER/STENT PLACEMENT/RETROGRADE PYELOGRAM CYSTOSCOPY, WITH RETROGRADE PYELOGRAM (Right) as a surgical intervention.  The patient's history has been reviewed, patient examined, no change in status, stable for surgery.  I have reviewed the patient's chart and labs.  Questions were answered to the patient's satisfaction.     Morene LELON Salines

## 2024-08-12 NOTE — Transfer of Care (Signed)
 Immediate Anesthesia Transfer of Care Note  Patient: Danielle Knapp  Procedure(s) Performed: CYSTOSCOPY/URETEROSCOPY/HOLMIUM LASER/STENT PLACEMENT (Right) CYSTOSCOPY, WITH RETROGRADE PYELOGRAM (Right)  Patient Location: PACU  Anesthesia Type:General  Level of Consciousness: awake  Airway & Oxygen Therapy: Patient Spontanous Breathing  Post-op Assessment: Report given to RN and Post -op Vital signs reviewed and stable  Post vital signs: Reviewed and stable  Last Vitals:  Vitals Value Taken Time  BP    Temp    Pulse 80 08/12/24 11:09  Resp    SpO2 99 % 08/12/24 11:09  Vitals shown include unfiled device data.  Last Pain:  Vitals:   08/12/24 0835  TempSrc: Oral  PainSc: 0-No pain         Complications: No notable events documented.

## 2024-08-12 NOTE — Discharge Instructions (Signed)
 DISCHARGE INSTRUCTIONS FOR KIDNEY STONE/URETERAL STENT   MEDICATIONS:  1.  Resume all your other meds from home - except do not take any extra narcotic pain meds that you may have at home.  2. Pyridium is to help with the burning/stinging when you urinate. 3. Tramadol  is for moderate/severe pain, otherwise taking upto 1000 mg every 6 hours of plainTylenol will help treat your pain.     ACTIVITY:  1. No strenuous activity x 1week  2. No driving while on narcotic pain medications  3. Drink plenty of water   4. Continue to walk at home - you can still get blood clots when you are at home, so keep active, but don't over do it.  5. May return to work/school tomorrow or when you feel ready   BATHING:  1. You can shower and we recommend daily showers  2. You have a string coming from your urethra: The stent string is attached to your ureteral stent. Do not pull on this.   SIGNS/SYMPTOMS TO CALL:  Please call us  if you have a fever greater than 101.5, uncontrolled nausea/vomiting, uncontrolled pain, dizziness, unable to urinate, bloody urine, chest pain, shortness of breath, leg swelling, leg pain, redness around wound, drainage from wound, or any other concerns or questions.   You can reach us  at [phone].   FOLLOW-UP:  1. You have an appointment in 6 weeks with a ultrasound of your kidneys prior. 2. You have a string attached to your stent, you may remove it on Monday, December 15. To do this, pull the strings until the stents are completely removed. You may feel an odd sensation in your back.

## 2024-08-12 NOTE — Anesthesia Preprocedure Evaluation (Addendum)
 Anesthesia Evaluation  Patient identified by MRN, date of birth, ID band Patient awake    Reviewed: Allergy & Precautions, NPO status , Patient's Chart, lab work & pertinent test results, reviewed documented beta blocker date and time   History of Anesthesia Complications Negative for: history of anesthetic complications  Airway Mallampati: I  TM Distance: >3 FB     Dental no notable dental hx.    Pulmonary sleep apnea    breath sounds clear to auscultation       Cardiovascular Exercise Tolerance: Good hypertension, (-) angina + CAD and + Past MI  (-) Cardiac Stents and (-) CABG (-) dysrhythmias (-) pacemaker(-) Cardiac Defibrillator  Rhythm:Regular Rate:Normal   Prox LAD lesion is 10% stenosed.  Mid Cx lesion is 10% stenosed.  Dist RCA lesion is 10% stenosed.  The left ventricular systolic function is normal.  LV end diastolic pressure is normal.  The left ventricular ejection fraction is 55-65% by visual estimate.  There is no aortic valve stenosis.    Neuro/Psych  PSYCHIATRIC DISORDERS Anxiety Depression       GI/Hepatic ,GERD  Medicated and Controlled,,(+) neg Cirrhosis        Endo/Other  neg diabetes    Renal/GU Renal disease     Musculoskeletal  (+) Arthritis ,    Abdominal   Peds  Hematology   Anesthesia Other Findings   Reproductive/Obstetrics                              Anesthesia Physical Anesthesia Plan  ASA: 2  Anesthesia Plan: General   Post-op Pain Management:    Induction: Intravenous  PONV Risk Score and Plan: 3 and Ondansetron  and Dexamethasone   Airway Management Planned: LMA  Additional Equipment:   Intra-op Plan:   Post-operative Plan: Extubation in OR  Informed Consent: I have reviewed the patients History and Physical, chart, labs and discussed the procedure including the risks, benefits and alternatives for the proposed anesthesia with  the patient or authorized representative who has indicated his/her understanding and acceptance.     Dental advisory given  Plan Discussed with: CRNA  Anesthesia Plan Comments:         Anesthesia Quick Evaluation

## 2024-08-12 NOTE — Anesthesia Procedure Notes (Addendum)
 Procedure Name: LMA Insertion Date/Time: 08/12/2024 10:03 AM  Performed by: Belvie Valri NOVAK, CRNAPre-anesthesia Checklist: Patient identified, Emergency Drugs available, Suction available and Patient being monitored Patient Re-evaluated:Patient Re-evaluated prior to induction Oxygen Delivery Method: Circle System Utilized Preoxygenation: Pre-oxygenation with 100% oxygen Induction Type: IV induction Ventilation: Mask ventilation without difficulty LMA: LMA inserted LMA Size: 4.0 Number of attempts: 1 Airway Equipment and Method: Bite block Placement Confirmation: positive ETCO2 Tube secured with: Tape Dental Injury: Teeth and Oropharynx as per pre-operative assessment

## 2024-08-12 NOTE — Anesthesia Postprocedure Evaluation (Signed)
 Anesthesia Post Note  Patient: Lukisha P Hounshell  Procedure(s) Performed: CYSTOSCOPY/URETEROSCOPY/HOLMIUM LASER/STENT PLACEMENT (Right) CYSTOSCOPY, WITH RETROGRADE PYELOGRAM (Right)     Patient location during evaluation: PACU Anesthesia Type: General Level of consciousness: awake and alert Pain management: pain level controlled Vital Signs Assessment: post-procedure vital signs reviewed and stable Respiratory status: spontaneous breathing, nonlabored ventilation, respiratory function stable and patient connected to nasal cannula oxygen Cardiovascular status: blood pressure returned to baseline and stable Postop Assessment: no apparent nausea or vomiting Anesthetic complications: no   No notable events documented.  Last Vitals:  Vitals:   08/12/24 1145 08/12/24 1200  BP: (!) 151/76 (!) 149/88  Pulse: 64 81  Resp: 14 16  Temp:    SpO2: 94% 95%    Last Pain:  Vitals:   08/12/24 1200  TempSrc:   PainSc: 3                  Lynwood MARLA Cornea

## 2024-08-13 ENCOUNTER — Encounter (HOSPITAL_COMMUNITY): Payer: Self-pay | Admitting: Urology

## 2024-08-17 ENCOUNTER — Emergency Department (HOSPITAL_COMMUNITY)
Admission: EM | Admit: 2024-08-17 | Discharge: 2024-08-17 | Disposition: A | Discharge status: Home / Self Care | Source: Ambulatory Visit | Attending: Emergency Medicine | Admitting: Emergency Medicine

## 2024-08-17 ENCOUNTER — Other Ambulatory Visit: Payer: Self-pay

## 2024-08-17 ENCOUNTER — Emergency Department (HOSPITAL_COMMUNITY)

## 2024-08-17 DIAGNOSIS — R10A1 Flank pain, right side: Secondary | ICD-10-CM

## 2024-08-17 DIAGNOSIS — D72829 Elevated white blood cell count, unspecified: Secondary | ICD-10-CM | POA: Diagnosis not present

## 2024-08-17 DIAGNOSIS — R109 Unspecified abdominal pain: Secondary | ICD-10-CM | POA: Diagnosis not present

## 2024-08-17 LAB — URINALYSIS, ROUTINE W REFLEX MICROSCOPIC
Bilirubin Urine: NEGATIVE
Glucose, UA: NEGATIVE mg/dL
Hgb urine dipstick: NEGATIVE
Ketones, ur: 20 mg/dL — AB
Leukocytes,Ua: NEGATIVE
Nitrite: NEGATIVE
Protein, ur: NEGATIVE mg/dL
Specific Gravity, Urine: 1.024 (ref 1.005–1.030)
pH: 6 (ref 5.0–8.0)

## 2024-08-17 LAB — CBC
HCT: 42.2 % (ref 36.0–46.0)
Hemoglobin: 15.5 g/dL — ABNORMAL HIGH (ref 12.0–15.0)
MCH: 31.7 pg (ref 26.0–34.0)
MCHC: 36.7 g/dL — ABNORMAL HIGH (ref 30.0–36.0)
MCV: 86.3 fL (ref 80.0–100.0)
Platelets: 275 K/uL (ref 150–400)
RBC: 4.89 MIL/uL (ref 3.87–5.11)
RDW: 13.4 % (ref 11.5–15.5)
WBC: 13.5 K/uL — ABNORMAL HIGH (ref 4.0–10.5)
nRBC: 0 % (ref 0.0–0.2)

## 2024-08-17 LAB — BASIC METABOLIC PANEL WITH GFR
Anion gap: 13 (ref 5–15)
BUN: 22 mg/dL (ref 8–23)
CO2: 24 mmol/L (ref 22–32)
Calcium: 9.7 mg/dL (ref 8.9–10.3)
Chloride: 103 mmol/L (ref 98–111)
Creatinine, Ser: 0.91 mg/dL (ref 0.44–1.00)
GFR, Estimated: >60 mL/min (ref >60)
Glucose, Bld: 115 mg/dL — ABNORMAL HIGH (ref 70–99)
Potassium: 4.1 mmol/L (ref 3.5–5.1)
Sodium: 140 mmol/L (ref 135–145)

## 2024-08-17 MED ORDER — OXYCODONE HCL 5 MG PO TABS: 5.0000 mg | ORAL_TABLET | Freq: Three times a day (TID) | ORAL | 0 refills | Status: AC | PRN

## 2024-08-17 MED ORDER — ONDANSETRON 4 MG PO TBDP: 4.0000 mg | ORAL_TABLET | Freq: Three times a day (TID) | ORAL | 0 refills | Status: AC | PRN

## 2024-08-17 MED ORDER — HYDROMORPHONE HCL 1 MG/ML IJ SOLN
0.5000 mg | Freq: Once | INTRAMUSCULAR | Status: AC
  Administered 2024-08-17: 22:00:00 0.5 mg via INTRAVENOUS

## 2024-08-17 MED ORDER — ONDANSETRON HCL 4 MG/2ML IJ SOLN
4.0000 mg | Freq: Once | INTRAMUSCULAR | Status: AC
  Administered 2024-08-17: 22:00:00 4 mg via INTRAVENOUS

## 2024-08-17 MED ORDER — HYDROMORPHONE HCL 1 MG/ML IJ SOLN
INTRAMUSCULAR | Status: AC
  Filled 2024-08-17: qty 1

## 2024-08-17 MED ORDER — ONDANSETRON HCL 4 MG/2ML IJ SOLN
INTRAMUSCULAR | Status: AC
  Filled 2024-08-17: qty 2

## 2024-08-17 MED ORDER — SODIUM CHLORIDE 0.9 % IV BOLUS
500.0000 mL | Freq: Once | INTRAVENOUS | Status: AC
  Administered 2024-08-17: 22:00:00 500 mL via INTRAVENOUS

## 2024-08-17 MED ORDER — KETOROLAC TROMETHAMINE 15 MG/ML IJ SOLN
15.0000 mg | Freq: Once | INTRAMUSCULAR | Status: AC
  Administered 2024-08-17: 22:00:00 15 mg via INTRAVENOUS

## 2024-08-17 MED ORDER — HYDROCODONE-ACETAMINOPHEN 5-325 MG PO TABS
1.0000 | ORAL_TABLET | Freq: Once | ORAL | Status: AC
  Administered 2024-08-17: 18:00:00 1 via ORAL
  Filled 2024-08-17: qty 1

## 2024-08-17 MED ORDER — KETOROLAC TROMETHAMINE 15 MG/ML IJ SOLN
INTRAMUSCULAR | Status: AC
  Filled 2024-08-17: qty 1

## 2024-08-17 NOTE — Progress Notes (Deleted)
 68 y.o. G0P0000 Married Caucasian female here for a breast and pelvic exam.    The patient is also followed for ***.  PCP: Teresa Channel, MD   Patient's last menstrual period was 09/03/2011.           Sexually active: No.  The current method of family planning is post menopausal status.    Menopausal hormone therapy:  n/a Exercising: {yes no:314532}  {types:19826} Smoker:  no  OB History     Gravida  0   Para  0   Term  0   Preterm  0   AB  0   Living  0      SAB  0   IAB  0   Ectopic  0   Multiple  0   Live Births              HEALTH MAINTENANCE: Last 2 paps: 08/20/22 neg, HR HPV neg, 09/27/20 neg, HR HPV neg  History of abnormal Pap or positive HPV:  no Mammogram:  12/09/23 Breast Density Cat B, BIRADS Cat 2 benign  Colonoscopy:  02/28/23  Bone Density:  01/24/23  Result  osteopenia    Immunization History  Administered Date(s) Administered   Fluzone Influenza virus vaccine,trivalent (IIV3), split virus 06/13/2010, 06/10/2013, 06/16/2014, 06/12/2016, 06/03/2018, 06/06/2020   Influenza Split 06/19/2011   Influenza,inj,Quad PF,6+ Mos 06/04/2017   Influenza-Unspecified 06/11/2019   Moderna Covid-19 Vaccine Bivalent Booster 24yrs & up 09/20/2021   PFIZER(Purple Top)SARS-COV-2 Vaccination 11/10/2019, 12/01/2019   Pfizer Covid-19 Vaccine Bivalent Booster 54yrs & up 06/18/2020, 02/25/2021   Td 08/15/2005   Tdap 07/18/2011, 06/21/2021   Zoster Recombinant(Shingrix) 12/03/2017, 03/25/2018   Zoster, Live 05/25/2010, 12/03/2017, 03/25/2018      reports that she has never smoked. She has never used smokeless tobacco. She reports current alcohol use of about 2.0 standard drinks of alcohol per week. She reports that she does not use drugs.  Past Medical History:  Diagnosis Date   Allergic rhinitis    Allergy    Anxiety    Bladder cancer (HCC) 10/17/2020   Borderline glaucoma, left    no eye drops   BPPV (benign paroxysmal positional vertigo)     Breast cancer (HCC) 01/23/2021   Cancer (HCC) 10/17/2020   Bladder and Endomitrial cancer in addition to breast cancer   Cataract    Chondromalacia of patella    left   Coronary artery disease    cardiac cath 11-03-2017 done for positive ETT,  showed very miminal nonobstrutive cad, ef 55-65%, non caridac chest pain   Diverticulosis of colon    Endometrial cancer (HCC)    Family history of breast cancer 12/08/2020   Family history of ovarian cancer 12/08/2020   Family history of prostate cancer 12/08/2020   Frequency of urination    GERD (gastroesophageal reflux disease)    History of chest pain 10/14/2017   ED visit w/ admission,  hypertensive urgency;  follow up with dr claudene--- had outpatient ETT 10-21-2017 positive ischemia, echo 10-16-2017 G1DD, 60-65%;    s/p cardiac cath 11-03-2017 w/ very miminal nonobstructive cad , chest pain non cardiac   History of COVID-19 06/01/2021   History of diverticulitis    History of kidney stones    HSV-1 infection    fever blisters   Hx of adenomatous colonic polyps 02/08/2018   Hypertension    followed by pcp   MDD (major depressive disorder)    Myocardial infarction (HCC) 2019   minor MI  OA (osteoarthritis)    left knee, shoulder   Osteoporosis    Polycythemia    long hx followed by pcp and  sees hematology/ oncology--- dr christella. sherrod on as needed basis, chronic and told to donate blood regularly   Rotator cuff tear 02/2023   right   Sleep apnea    pt states minimal sleep apnea that did not require cpap   Stenosis of cervix    Uterine mass    Wears glasses     Past Surgical History:  Procedure Laterality Date   ABDOMINAL HYSTERECTOMY     ACHILLES TENDON SURGERY Left 03/07/2023   Procedure: ACHILLES TENDON REPAIR - SECONDARY;  Surgeon: Ashley Soulier, DPM;  Location: [facility];  Service: Orthopedics/Podiatry;  Laterality: Left;   ARTHOSCOPIC ROTAOR CUFF REPAIR Right 05/18/2024   Procedure: REPAIR, ROTATOR CUFF, ARTHROSCOPIC;   Surgeon: Edie Norleen PARAS, MD;  Location: [facility];  Service: Orthopedics;  Laterality: Right;   BICEPT TENODESIS Right 05/18/2024   Procedure: TENODESIS, BICEPS;  Surgeon: Edie Norleen PARAS, MD;  Location: [facility];  Service: Orthopedics;  Laterality: Right;   BREAST LUMPECTOMY WITH RADIOACTIVE SEED LOCALIZATION Right 02/16/2021   Procedure: RIGHT BREAST LUMPECTOMY WITH RADIOACTIVE SEED LOCALIZATION;  Surgeon: Vernetta Berg, MD;  Location: [facility];  Service: General;  Laterality: Right;   CARDIAC CATHETERIZATION     CATARACT EXTRACTION W/ INTRAOCULAR LENS  IMPLANT, BILATERAL  2013   COLONOSCOPY  02-02-2018 dr avram   CYSTOSCOPY  10/17/2020   Procedure: CYSTOSCOPY;  Surgeon: Cathlyn JAYSON Nikki Bobie FORBES, MD;  Location: [facility];  Service: Gynecology;;   CYSTOSCOPY N/A 11/09/2020   Procedure: PHYLLIS;  Surgeon: Rosalind Zachary NOVAK, MD;  Location: [facility];  Service: Urology;  Laterality: N/A;  Dr. Rosalind needs to go first. [name] and rigid bx forceps.   CYSTOSCOPY W/ RETROGRADES Right 08/12/2024   Procedure: CYSTOSCOPY, WITH RETROGRADE PYELOGRAM;  Surgeon: Cam Morene ORN, MD;  Location: [facility];  Service: Urology;  Laterality: Right;   CYSTOSCOPY/RETROGRADE/URETEROSCOPY Left 10/21/2023   Procedure: CYSTOSCOPY/RETROGRADE/URETEROSCOPY;  Surgeon: Watt Norleen, MD;  Location: [facility];  Service: Urology;  Laterality: Left;   CYSTOSCOPY/URETEROSCOPY/HOLMIUM LASER/STENT PLACEMENT Right 08/12/2024   Procedure: CYSTOSCOPY/URETEROSCOPY/HOLMIUM LASER/STENT PLACEMENT;  Surgeon: Cam Morene ORN, MD;  Location: [facility];  Service: Urology;  Laterality: Right;  CYSTOSCOPY/RIGHT URETEROSCOPY/HOLMIUM LASER/STENT PLACEMENT/RETROGRADE PYELOGRAM   DIAGNOSTIC LAPAROSCOPY  1996   DILATATION & CURETTAGE/HYSTEROSCOPY WITH MYOSURE N/A 10/17/2020   Procedure: DILATATION & CURETTAGE/HYSTEROSCOPY WITH  MYOSURE RESECTION OF ENDOMETRIAL MASS,  OPENING OF CERVICAL STENOSIS, DRAINAGE OF UTERINE  FLUID;  Surgeon: Cathlyn JAYSON Nikki Bobie FORBES, MD;  Location: [facility];  Service: Gynecology;  Laterality: N/A;   EYE SURGERY     FLEXOR TENOTOMY  Left 03/07/2023   Procedure: 72341 - FLEXOR TENDON REPAIR;  Surgeon: Ashley Soulier, DPM;  Location: [facility];  Service: Orthopedics/Podiatry;  Laterality: Left;   FRACTURE SURGERY     HEEL SPUR SURGERY Right 2015   Dr. Anderson   HEMORRHOID SURGERY  2004   LEFT HEART CATH AND CORONARY ANGIOGRAPHY N/A 11/03/2017   Procedure: LEFT HEART CATH AND CORONARY ANGIOGRAPHY;  Surgeon: Dann Candyce RAMAN, MD;  Location: [facility];  Service: Cardiovascular;  Laterality: N/A;   LYMPH NODE DISSECTION N/A 11/09/2020   Procedure: BILATERAL LYMPH NODE DISSECTION;MINI LAPAROTOMY;  Surgeon: Viktoria Comer SAUNDERS, MD;  Location: [facility];  Service: Gynecology;  Laterality: N/A;   OPERATIVE ULTRASOUND N/A 10/17/2020   Procedure: OPERATIVE ULTRASOUND;  Surgeon: Cathlyn JAYSON Nikki Bobie FORBES, MD;  Location: [facility];  Service: Gynecology;  Laterality: N/A;   OSTECTOMY Left 03/07/2023   Procedure: HAGLUNDS RETROCALCANEAL OSTECTOMY;  Surgeon: Ashley Soulier, DPM;  Location: [facility];  Service: Orthopedics/Podiatry;  Laterality: Left;   POSTERIOR LUMBAR FUSION 2 WITH HARDWARE REMOVAL Right 05/18/2024   Procedure: ARTHROSCOPY, SHOULDER WITH DEBRIDEMENT;  Surgeon: Edie Norleen PARAS, MD;  Location: [facility];  Service: Orthopedics;  Laterality: Right;   ROBOTIC ASSISTED TOTAL HYSTERECTOMY WITH BILATERAL SALPINGO OOPHERECTOMY Bilateral 11/09/2020   Procedure: XI ROBOTIC ASSISTED TOTAL HYSTERECTOMY WITH BILATERAL SALPINGO OOPHORECTOMY,;  Surgeon: Viktoria Comer SAUNDERS, MD;  Location: [facility];  Service: Gynecology;  Laterality: Bilateral;   SEPTOPLASTY  x2 1970's   SHOULDER OPEN ROTATOR CUFF REPAIR Right 05/18/2024   Procedure: REPAIR, ROTATOR CUFF, OPEN;  Surgeon: Edie Norleen PARAS, MD;  Location: [facility];  Service: Orthopedics;  Laterality: Right;    SUBACROMIAL DECOMPRESSION Right 05/18/2024   Procedure: DECOMPRESSION, SUBACROMIAL SPACE;  Surgeon: Edie Norleen PARAS, MD;  Location: [facility];  Service: Orthopedics;  Laterality: Right;   TONSILLECTOMY  age 38   TRANSURETHRAL RESECTION OF BLADDER TUMOR N/A 11/09/2020   Procedure: TRANSURETHRAL RESECTION OF BLADDER TUMOR (TURBT);  Surgeon: Rosalind Zachary NOVAK, MD;  Location: [facility];  Service: Urology;  Laterality: N/A;    Current Outpatient Medications  Medication Sig Dispense Refill   amLODipine  (NORVASC ) 5 MG tablet Take 5 mg by mouth daily.     anastrozole  (ARIMIDEX ) 1 MG tablet TAKE ONE TABLET BY MOUTH ONE TIME DAILY 90 tablet 3   ascorbic acid  (VITAMIN C ) 500 MG tablet Take 1,000 mg by mouth daily.     buPROPion  (WELLBUTRIN  XL) 300 MG 24 hr tablet Take 300 mg by mouth every morning.     cephALEXin  (KEFLEX ) 250 MG capsule Take 250 mg by mouth daily.     Cholecalciferol  (D3-1000 PO) Take 1,000 Units by mouth daily.     COLLAGEN PO Take 1 tablet by mouth 2 (two) times daily as needed.     fluticasone  (FLONASE ) 50 MCG/ACT nasal spray Place 2 sprays into both nostrils daily as needed for allergies.     Multiple Minerals-Vitamins (CAL MAG ZINC +D3 PO) Take 1 tablet by mouth daily.     Multiple Vitamin (MULTIVITAMIN WITH MINERALS) TABS tablet Take 1 tablet by mouth every evening.     pantoprazole  (PROTONIX ) 40 MG tablet Take 40 mg by mouth daily.     phenazopyridine  (PYRIDIUM ) 200 MG tablet Take 1 tablet (200 mg total) by mouth 3 (three) times daily as needed for pain. 10 tablet 0   sertraline  (ZOLOFT ) 100 MG tablet Take 200 mg by mouth every morning.     tamsulosin  (FLOMAX ) 0.4 MG CAPS capsule Take 1 capsule (0.4 mg total) by mouth daily. 7 capsule 0   traMADol  (ULTRAM ) 50 MG tablet Take 1-2 tablets (50-100 mg total) by mouth every 6 (six) hours as needed for moderate pain (pain score 4-6). 15 tablet 0   valACYclovir  (VALTREX ) 1000 MG tablet Take 2 tablets (2,000 mg total) by mouth 2 (two) times daily  as needed (for fever blisters). 30 tablet 2   valsartan (DIOVAN) 320 MG tablet Take 320 mg by mouth daily.     zolpidem  (AMBIEN ) 5 MG tablet Take 5 mg by mouth at bedtime.     No current facility-administered medications for this visit.    ALLERGIES: Ciprofloxacin hcl, Erythromycin, Flagyl [metronidazole], Ace inhibitors, and Lisinopril  Family History  Problem  Relation Age of Onset   Osteoporosis Mother    Heart attack Mother    Alzheimer's disease Mother    Arthritis Mother    Depression Mother    Diabetes Father    Hypertension Father    Heart failure Father    Alcohol abuse Father    COPD Father    Ovarian cancer Sister 54   Stroke Maternal Grandmother    Prostate cancer Maternal Grandfather 30       metastatic   Cancer Maternal Grandfather    Breast cancer Other        MGM's sister; dx after 45   Vision loss Paternal Grandmother    Colon cancer Neg Hx    Colon polyps Neg Hx    Esophageal cancer Neg Hx    Rectal cancer Neg Hx    Stomach cancer Neg Hx    Endometrial cancer Neg Hx    Pancreatic cancer Neg Hx     Review of Systems  PHYSICAL EXAM:  LMP 09/03/2011     General appearance: alert, cooperative and appears stated age Head: normocephalic, without obvious abnormality, atraumatic Neck: no adenopathy, supple, symmetrical, trachea midline and thyroid  normal to inspection and palpation Lungs: clear to auscultation bilaterally Breasts: normal appearance, no masses or tenderness, No nipple retraction or dimpling, No nipple discharge or bleeding, No axillary adenopathy Heart: regular rate and rhythm Abdomen: soft, non-tender; no masses, no organomegaly Extremities: extremities normal, atraumatic, no cyanosis or edema Skin: skin color, texture, turgor normal. No rashes or lesions Lymph nodes: cervical, supraclavicular, and axillary nodes normal. Neurologic: grossly normal  Pelvic: External genitalia:  no lesions              No abnormal inguinal nodes  palpated.              Urethra:  normal appearing urethra with no masses, tenderness or lesions              Bartholins and Skenes: normal                 Vagina: normal appearing vagina with normal color and discharge, no lesions              Cervix: no lesions              Pap taken: {yes no:314532} Bimanual Exam:  Uterus:  normal size, contour, position, consistency, mobility, non-tender              Adnexa: no mass, fullness, tenderness              Rectal exam: {yes no:314532}.  Confirms.              Anus:  normal sphincter tone, no lesions  Chaperone was present for exam:  {BSCHAPERONE:31226::Emily F, CMA}  ASSESSMENT: Encounter for breast and pelvic exam.   ***  PLAN: Mammogram screening discussed. Self breast awareness reviewed. Pap and HRV collected:  {yes no:314532} Guidelines for Calcium , Vitamin D , regular exercise program including cardiovascular and weight bearing exercise. Medication refills:  *** {LABS (Optional):23779} Follow up:  ***    Additional counseling given.  {yes [id number]. ***  total time was spent for this patient encounter, including preparation, face-to-face counseling with the patient, coordination of care, and documentation of the encounter in addition to doing the breast and pelvic exam.

## 2024-08-17 NOTE — Discharge Instructions (Addendum)
 Return if any problems.

## 2024-08-17 NOTE — ED Notes (Signed)
Blue top sent to lab as well.  

## 2024-08-17 NOTE — ED Provider Triage Note (Signed)
 Emergency Medicine Provider Triage Evaluation Note  Danielle Knapp , a 68 y.o. female  was evaluated in triage.  Pt complains of flank pain.  Pt removed a renal stent yesterday.  Pt complains of vomiting and flank pain  Review of Systems  Positive: Abdominal pain Negative: Body aches  Physical Exam  LMP 09/03/2011  Gen:   Awake, uncomfortable Resp:  Normal effort  MSK:   Moves extremities without difficulty  Other:    Medical Decision Making  Medically screening exam initiated at 1:09 PM.  Appropriate orders placed.  Danielle Knapp was informed that the remainder of the evaluation will be completed by another provider, this initial triage assessment does not replace that evaluation, and the importance of remaining in the ED until their evaluation is complete.     Danielle Sonny POUR, PA-C 08/17/24 1311

## 2024-08-17 NOTE — ED Provider Notes (Signed)
 [place] EMERGENCY DEPARTMENT AT [facility] Provider Note   CSN: [id number] Arrival date & time: 08/17/24  1300     Patient presents with: Flank Pain and Post-op Problem   Danielle Knapp is a 68 y.o. female.   Patient complains of right flank pain.  Patient reports that she recently had a kidney stone.  Patient states that she had removal of the stone and stenting.  Patient states that she pulled the stent at home yesterday.  Patient reports she began having severe pain today.  Patient is concerned because she has had sepsis in the past.  Patient has not had any fever or chills.  Patient complains of nausea.  The history is provided by the patient and the spouse. No language interpreter was used.  Flank Pain This is a new problem. The current episode started 1 to 2 hours ago. The problem occurs constantly. The problem has been gradually worsening. Associated symptoms include abdominal pain. Nothing relieves the symptoms. She has tried nothing for the symptoms.       Prior to Admission medications  Medication Sig Start Date End Date Taking? Authorizing Provider  amLODipine  (NORVASC ) 5 MG tablet Take 5 mg by mouth daily.    [provider]  anastrozole  (ARIMIDEX ) 1 MG tablet TAKE ONE TABLET BY MOUTH ONE TIME DAILY 05/18/24   Odean Potts, MD  ascorbic acid  (VITAMIN C ) 500 MG tablet Take 1,000 mg by mouth daily.    [provider]  buPROPion  (WELLBUTRIN  XL) 300 MG 24 hr tablet Take 300 mg by mouth every morning.    [provider]  cephALEXin  (KEFLEX ) 250 MG capsule Take 250 mg by mouth daily. 07/09/24   [provider]  Cholecalciferol  (D3-1000 PO) Take 1,000 Units by mouth daily.    [provider]  COLLAGEN PO Take 1 tablet by mouth 2 (two) times daily as needed.    [provider]  fluticasone  (FLONASE ) 50 MCG/ACT nasal spray Place 2 sprays into both nostrils daily as needed for allergies. 10/13/19   [provider]  Multiple Minerals-Vitamins (CAL MAG ZINC +D3 PO) Take 1 tablet by mouth daily.    [provider]  Multiple Vitamin (MULTIVITAMIN WITH MINERALS) TABS tablet Take 1 tablet by mouth every evening.    [provider]  pantoprazole  (PROTONIX ) 40 MG tablet Take 40 mg by mouth daily.    [provider]  phenazopyridine  (PYRIDIUM ) 200 MG tablet Take 1 tablet (200 mg total) by mouth 3 (three) times daily as needed for pain. 08/12/24   Cam Morene ORN, MD  sertraline  (ZOLOFT ) 100 MG tablet Take 200 mg by mouth every morning. 07/22/12   [provider]  tamsulosin  (FLOMAX ) 0.4 MG CAPS capsule Take 1 capsule (0.4 mg total) by mouth daily. 08/12/24   Cam Morene ORN, MD  traMADol  (ULTRAM ) 50 MG tablet Take 1-2 tablets (50-100 mg total) by mouth every 6 (six) hours as needed for moderate pain (pain score 4-6). 08/12/24   Cam Morene ORN, MD  valACYclovir  (VALTREX ) 1000 MG tablet Take 2 tablets (2,000 mg total) by mouth 2 (two) times daily as needed (for fever blisters). 08/20/22   Cathlyn JAYSON Nikki Bobie FORBES, MD  valsartan (DIOVAN) 320 MG tablet Take 320 mg by mouth daily.    [provider]  zolpidem  (AMBIEN ) 5 MG tablet Take 5 mg by mouth at bedtime. 06/21/21   [provider]    Allergies: Ciprofloxacin hcl, Erythromycin, Flagyl [metronidazole], Ace inhibitors,  and Lisinopril    Review of Systems  Gastrointestinal:  Positive for abdominal pain and nausea.  Genitourinary:  Positive for flank pain.  All other systems reviewed and are negative.   Updated Vital Signs BP (!) 154/78 (BP Location: Left Arm)   Pulse 71   Temp 98.2 F (36.8 C) (Oral)   Resp 18   LMP 09/03/2011   SpO2 95%   Physical Exam Vitals and nursing note reviewed.  Constitutional:      Appearance: She is well-developed.  HENT:     Head: Normocephalic.  Cardiovascular:     Rate and Rhythm: Normal rate.  Pulmonary:     Effort: Pulmonary effort is  normal.  Abdominal:     General: There is no distension.     Palpations: Abdomen is soft.  Musculoskeletal:        General: Normal range of motion.     Cervical back: Normal range of motion.  Skin:    General: Skin is warm.  Neurological:     General: No focal deficit present.     Mental Status: She is alert and oriented to person, place, and time.  Psychiatric:        Mood and Affect: Mood normal.     (all labs ordered are listed, but only abnormal results are displayed) Labs Reviewed  URINALYSIS, ROUTINE W REFLEX MICROSCOPIC - Abnormal; Notable for the following components:      Result Value   Ketones, ur 20 (*)    All other components within normal limits  BASIC METABOLIC PANEL WITH GFR - Abnormal; Notable for the following components:   Glucose, Bld 115 (*)    All other components within normal limits  CBC - Abnormal; Notable for the following components:   WBC 13.5 (*)    Hemoglobin 15.5 (*)    MCHC 36.7 (*)    All other components within normal limits    EKG: None  Radiology: CT Renal Stone Study Result Date: 08/17/2024 CLINICAL DATA:  Abdominal pain. EXAM: CT ABDOMEN AND PELVIS WITHOUT CONTRAST TECHNIQUE: Multidetector CT imaging of the abdomen and pelvis was performed following the standard protocol without IV contrast. RADIATION DOSE REDUCTION: This exam was performed according to the departmental dose-optimization program which includes automated exposure control, adjustment of the mA and/or kV according to patient size and/or use of iterative reconstruction technique. COMPARISON:  CT abdomen pelvis dated 07/02/2024. FINDINGS: Evaluation of this exam is limited in the absence of intravenous contrast. Lower chest: Bibasilar linear atelectasis. No intra-abdominal free air or free fluid. Hepatobiliary: Fatty liver. No biliary ductal dilatation. Tiny stones within the gallbladder. No pericholecystic fluid or evidence of acute cholecystitis by CT. Pancreas: Unremarkable.  No pancreatic ductal dilatation or surrounding inflammatory changes. Spleen: Normal in size without focal abnormality. Adrenals/Urinary Tract: The adrenal glands unremarkable. Several small nonobstructing bilateral renal calculi measure up to 2 mm. There is mild right hydronephrosis. No obstructing stone. Mild stranding adjacent to the right kidney and right ureter. Findings may be related to a recently passed stone or represent pyelonephritis. Correlation with urinalysis recommended. There is no hydronephrosis on the left. The left ureter and urinary bladder appear unremarkable. Stomach/Bowel: There is moderate sigmoid diverticulosis and scattered colonic diverticula. There is a small hiatal hernia. There is no bowel obstruction or active inflammation. The appendix is not visualized with certainty. No inflammatory changes identified in the right lower quadrant. Vascular/Lymphatic: Mild atherosclerotic calcification of the abdominal aorta. The IVC is unremarkable no portal venous gas. There  is no adenopathy. Reproductive: Hysterectomy.  No suspicious adnexal masses. Other: None Musculoskeletal: Osteopenia with degenerative changes of the spine. No acute osseous pathology. IMPRESSION: 1. Mild right hydronephrosis with findings suggestive of a recently passed stone or right-sided pyelonephritis. Correlation with urinalysis recommended. 2. Small nonobstructing bilateral renal calculi. 3. Fatty liver. 4. Cholelithiasis. 5. Colonic diverticulosis. No bowel obstruction. 6.  Aortic Atherosclerosis (ICD10-I70.0). Electronically Signed   By: Vanetta Chou M.D.   On: 08/17/2024 19:26     Procedures   Medications Ordered in the ED  HYDROcodone -acetaminophen  (NORCO/VICODIN) 5-325 MG per tablet 1 tablet (1 tablet Oral Given 08/17/24 1745)  HYDROmorphone  (DILAUDID ) injection 0.5 mg (0.5 mg Intravenous Given 08/17/24 2209)  ketorolac  (TORADOL ) 15 MG/ML injection 15 mg (15 mg Intravenous Given 08/17/24 2201)  sodium  chloride 0.9 % bolus 500 mL (500 mLs Intravenous New Bag/Given 08/17/24 2209)  ondansetron  (ZOFRAN ) injection 4 mg (4 mg Intravenous Given 08/17/24 2209)                                    Medical Decision Making Patient complains of right flank pain.  Patient recently had a stent and removal of a stone.  Patient states that she pulled the stent out yesterday.  Patient complains of having severe pain today.   Amount and/or Complexity of Data Reviewed Independent Historian: spouse    Details: Patient is here with her spouse who is supportive External Data Reviewed: notes.    Details: Urology procedure notes reviewed Labs: ordered. Decision-making details documented in ED Course.    Details: Labs ordered reviewed and interpreted.  Renal function is normal.  Patient has slightly elevated white blood cell count of 13.5. Radiology: ordered and independent interpretation performed. Decision-making details documented in ED Course.    Details: CT renal shows mild right hydronephrosis.  Radiologist reports this is consistent with recently passed stone and advised chronic with urinalysis for Pyelonephritis.  Risk Prescription drug management. Risk Details: Pt unable to obtain ua for an extended period of time.  Patient declined catheterization.  Patient reports no relief with oral pain medicines.  Patient given IV fluids x 500 cc Toradol  and Dilaudid  IV.  Patient's care turned over to Medford Fellows, PA-C for final disposition.        Final diagnoses:  Right flank pain    ED Discharge Orders          Ordered    oxyCODONE  (ROXICODONE ) 5 MG immediate release tablet  Every 8 hours PRN        08/17/24 2217            An After Visit Summary was printed and given to the patient.    Flint Sonny MARLA DEVONNA 08/17/24 2227    Francesca Elsie CROME, MD 08/17/24 [phone]

## 2024-08-17 NOTE — ED Triage Notes (Signed)
 Pt ambulatory to triage with complaints of RIGHT sided flank and back pain. Pt reports that she had a ureteral stent placed on 12/11, and was feeling better until today. Denies dysuria, denies difficulty urinating.

## 2024-08-18 ENCOUNTER — Encounter: Admitting: Obstetrics and Gynecology

## 2024-08-20 LAB — STONE ANALYSIS
Calcium Oxalate Monohydrate: 100 %
Weight Calculi: 32 mg

## 2024-09-07 ENCOUNTER — Encounter: Payer: Self-pay | Admitting: Surgery

## 2025-01-18 ENCOUNTER — Ambulatory Visit: Admitting: Obstetrics and Gynecology

## 2025-01-18 ENCOUNTER — Ambulatory Visit: Admitting: Gynecologic Oncology

## 2025-07-19 ENCOUNTER — Inpatient Hospital Stay: Admitting: Hematology and Oncology
